# Patient Record
Sex: Male | Born: 1950 | Race: White | Hispanic: No | Marital: Married | Smoking: Former smoker
Health system: Southern US, Community
[De-identification: ages and names within clinical notes are randomized; demographics above are authoritative.]

## PROBLEM LIST (undated history)

## (undated) DIAGNOSIS — A515 Early syphilis, latent: Secondary | ICD-10-CM

## (undated) DIAGNOSIS — B20 Human immunodeficiency virus [HIV] disease: Secondary | ICD-10-CM

## (undated) DIAGNOSIS — A159 Respiratory tuberculosis unspecified: Secondary | ICD-10-CM

## (undated) DIAGNOSIS — I2510 Atherosclerotic heart disease of native coronary artery without angina pectoris: Secondary | ICD-10-CM

## (undated) DIAGNOSIS — H269 Unspecified cataract: Secondary | ICD-10-CM

## (undated) DIAGNOSIS — E079 Disorder of thyroid, unspecified: Secondary | ICD-10-CM

## (undated) DIAGNOSIS — Z87442 Personal history of urinary calculi: Secondary | ICD-10-CM

## (undated) DIAGNOSIS — E291 Testicular hypofunction: Secondary | ICD-10-CM

## (undated) DIAGNOSIS — E119 Type 2 diabetes mellitus without complications: Secondary | ICD-10-CM

## (undated) DIAGNOSIS — C099 Malignant neoplasm of tonsil, unspecified: Secondary | ICD-10-CM

## (undated) DIAGNOSIS — N189 Chronic kidney disease, unspecified: Secondary | ICD-10-CM

## (undated) DIAGNOSIS — I10 Essential (primary) hypertension: Secondary | ICD-10-CM

## (undated) DIAGNOSIS — K219 Gastro-esophageal reflux disease without esophagitis: Secondary | ICD-10-CM

## (undated) DIAGNOSIS — F419 Anxiety disorder, unspecified: Secondary | ICD-10-CM

## (undated) DIAGNOSIS — G709 Myoneural disorder, unspecified: Secondary | ICD-10-CM

## (undated) DIAGNOSIS — R011 Cardiac murmur, unspecified: Secondary | ICD-10-CM

## (undated) DIAGNOSIS — E785 Hyperlipidemia, unspecified: Secondary | ICD-10-CM

## (undated) DIAGNOSIS — E039 Hypothyroidism, unspecified: Secondary | ICD-10-CM

## (undated) HISTORY — DX: Testicular hypofunction: E29.1

## (undated) HISTORY — DX: Early syphilis, latent: A51.5

## (undated) HISTORY — DX: Unspecified cataract: H26.9

## (undated) HISTORY — PX: COLONOSCOPY: SHX174

## (undated) HISTORY — DX: Anxiety disorder, unspecified: F41.9

## (undated) HISTORY — DX: Gastro-esophageal reflux disease without esophagitis: K21.9

## (undated) HISTORY — PX: TONSILECTOMY, ADENOIDECTOMY, BILATERAL MYRINGOTOMY AND TUBES: SHX2538

## (undated) HISTORY — DX: Chronic kidney disease, unspecified: N18.9

## (undated) HISTORY — DX: Myoneural disorder, unspecified: G70.9

## (undated) HISTORY — DX: Cardiac murmur, unspecified: R01.1

## (undated) HISTORY — DX: Essential (primary) hypertension: I10

## (undated) HISTORY — DX: Hyperlipidemia, unspecified: E78.5

## (undated) HISTORY — DX: Human immunodeficiency virus (HIV) disease: B20

## (undated) HISTORY — DX: Disorder of thyroid, unspecified: E07.9

## (undated) HISTORY — PX: UPPER GASTROINTESTINAL ENDOSCOPY: SHX188

## (undated) HISTORY — DX: Respiratory tuberculosis unspecified: A15.9

## (undated) HISTORY — PX: TONSILLECTOMY: SUR1361

---

## 1992-05-07 ENCOUNTER — Encounter (INDEPENDENT_AMBULATORY_CARE_PROVIDER_SITE_OTHER): Payer: Self-pay | Admitting: *Deleted

## 1992-05-07 LAB — CONVERTED CEMR LAB: CD4 T Cell Abs: 9

## 1998-04-14 ENCOUNTER — Encounter: Admission: RE | Admit: 1998-04-14 | Discharge: 1998-04-14 | Payer: Self-pay | Admitting: Infectious Diseases

## 1998-05-01 ENCOUNTER — Encounter: Admission: RE | Admit: 1998-05-01 | Discharge: 1998-05-01 | Payer: Self-pay | Admitting: Infectious Diseases

## 1998-08-21 ENCOUNTER — Ambulatory Visit (HOSPITAL_COMMUNITY): Admission: RE | Admit: 1998-08-21 | Discharge: 1998-08-21 | Payer: Self-pay | Admitting: Infectious Diseases

## 1998-09-11 ENCOUNTER — Encounter: Admission: RE | Admit: 1998-09-11 | Discharge: 1998-09-11 | Payer: Self-pay | Admitting: Infectious Diseases

## 1998-09-18 ENCOUNTER — Ambulatory Visit (HOSPITAL_COMMUNITY): Admission: RE | Admit: 1998-09-18 | Discharge: 1998-09-18 | Payer: Self-pay | Admitting: Infectious Diseases

## 1998-10-20 ENCOUNTER — Encounter: Admission: RE | Admit: 1998-10-20 | Discharge: 1998-10-20 | Payer: Self-pay | Admitting: Infectious Diseases

## 1998-11-06 ENCOUNTER — Encounter: Admission: RE | Admit: 1998-11-06 | Discharge: 1998-11-06 | Payer: Self-pay | Admitting: Infectious Diseases

## 1999-01-15 ENCOUNTER — Ambulatory Visit (HOSPITAL_COMMUNITY): Admission: RE | Admit: 1999-01-15 | Discharge: 1999-01-15 | Payer: Self-pay | Admitting: Infectious Diseases

## 1999-03-05 ENCOUNTER — Encounter: Admission: RE | Admit: 1999-03-05 | Discharge: 1999-03-05 | Payer: Self-pay | Admitting: Infectious Diseases

## 1999-04-16 ENCOUNTER — Encounter: Admission: RE | Admit: 1999-04-16 | Discharge: 1999-04-16 | Payer: Self-pay | Admitting: Infectious Diseases

## 1999-06-01 ENCOUNTER — Ambulatory Visit (HOSPITAL_COMMUNITY): Admission: RE | Admit: 1999-06-01 | Discharge: 1999-06-01 | Payer: Self-pay | Admitting: Infectious Diseases

## 1999-06-01 ENCOUNTER — Encounter: Admission: RE | Admit: 1999-06-01 | Discharge: 1999-06-01 | Payer: Self-pay | Admitting: Infectious Diseases

## 1999-06-18 ENCOUNTER — Encounter: Admission: RE | Admit: 1999-06-18 | Discharge: 1999-06-18 | Payer: Self-pay | Admitting: Infectious Diseases

## 1999-09-17 ENCOUNTER — Encounter: Admission: RE | Admit: 1999-09-17 | Discharge: 1999-09-17 | Payer: Self-pay | Admitting: Infectious Diseases

## 1999-11-01 ENCOUNTER — Encounter: Admission: RE | Admit: 1999-11-01 | Discharge: 1999-11-01 | Payer: Self-pay | Admitting: Infectious Diseases

## 1999-11-01 ENCOUNTER — Ambulatory Visit (HOSPITAL_COMMUNITY): Admission: RE | Admit: 1999-11-01 | Discharge: 1999-11-01 | Payer: Self-pay | Admitting: Infectious Diseases

## 1999-11-26 ENCOUNTER — Encounter: Admission: RE | Admit: 1999-11-26 | Discharge: 1999-11-26 | Payer: Self-pay | Admitting: Infectious Diseases

## 2000-02-28 ENCOUNTER — Encounter: Admission: RE | Admit: 2000-02-28 | Discharge: 2000-02-28 | Payer: Self-pay | Admitting: Infectious Diseases

## 2000-02-28 ENCOUNTER — Ambulatory Visit (HOSPITAL_COMMUNITY): Admission: RE | Admit: 2000-02-28 | Discharge: 2000-02-28 | Payer: Self-pay | Admitting: Infectious Diseases

## 2000-03-24 ENCOUNTER — Encounter: Admission: RE | Admit: 2000-03-24 | Discharge: 2000-03-24 | Payer: Self-pay | Admitting: Infectious Diseases

## 2000-07-21 ENCOUNTER — Ambulatory Visit (HOSPITAL_COMMUNITY): Admission: RE | Admit: 2000-07-21 | Discharge: 2000-07-21 | Payer: Self-pay | Admitting: Infectious Diseases

## 2000-07-21 ENCOUNTER — Encounter: Admission: RE | Admit: 2000-07-21 | Discharge: 2000-07-21 | Payer: Self-pay | Admitting: Infectious Diseases

## 2000-08-25 ENCOUNTER — Encounter: Admission: RE | Admit: 2000-08-25 | Discharge: 2000-08-25 | Payer: Self-pay | Admitting: Infectious Diseases

## 2000-09-22 ENCOUNTER — Encounter: Admission: RE | Admit: 2000-09-22 | Discharge: 2000-09-22 | Payer: Self-pay | Admitting: Internal Medicine

## 2000-09-25 ENCOUNTER — Encounter: Admission: RE | Admit: 2000-09-25 | Discharge: 2000-09-25 | Payer: Self-pay | Admitting: Infectious Diseases

## 2000-12-29 ENCOUNTER — Encounter: Admission: RE | Admit: 2000-12-29 | Discharge: 2000-12-29 | Payer: Self-pay | Admitting: Infectious Diseases

## 2000-12-29 ENCOUNTER — Ambulatory Visit: Admission: RE | Admit: 2000-12-29 | Discharge: 2000-12-29 | Payer: Self-pay | Admitting: Infectious Diseases

## 2001-01-25 ENCOUNTER — Encounter: Admission: RE | Admit: 2001-01-25 | Discharge: 2001-01-25 | Payer: Self-pay | Admitting: Infectious Diseases

## 2001-03-02 ENCOUNTER — Ambulatory Visit (HOSPITAL_COMMUNITY): Admission: RE | Admit: 2001-03-02 | Discharge: 2001-03-02 | Payer: Self-pay | Admitting: Internal Medicine

## 2001-03-02 ENCOUNTER — Encounter: Admission: RE | Admit: 2001-03-02 | Discharge: 2001-03-02 | Payer: Self-pay | Admitting: Infectious Diseases

## 2001-03-16 ENCOUNTER — Encounter: Admission: RE | Admit: 2001-03-16 | Discharge: 2001-03-16 | Payer: Self-pay | Admitting: Infectious Diseases

## 2001-05-08 ENCOUNTER — Encounter: Admission: RE | Admit: 2001-05-08 | Discharge: 2001-05-08 | Payer: Self-pay | Admitting: Infectious Diseases

## 2001-05-08 ENCOUNTER — Ambulatory Visit (HOSPITAL_COMMUNITY): Admission: RE | Admit: 2001-05-08 | Discharge: 2001-05-08 | Payer: Self-pay | Admitting: Infectious Diseases

## 2001-05-18 ENCOUNTER — Ambulatory Visit (HOSPITAL_COMMUNITY): Admission: RE | Admit: 2001-05-18 | Discharge: 2001-05-18 | Payer: Self-pay | Admitting: Infectious Diseases

## 2001-05-18 ENCOUNTER — Encounter: Payer: Self-pay | Admitting: Infectious Diseases

## 2001-05-18 ENCOUNTER — Encounter: Admission: RE | Admit: 2001-05-18 | Discharge: 2001-05-18 | Payer: Self-pay | Admitting: Infectious Diseases

## 2001-05-21 ENCOUNTER — Inpatient Hospital Stay (HOSPITAL_COMMUNITY): Admission: AD | Admit: 2001-05-21 | Discharge: 2001-05-25 | Payer: Self-pay | Admitting: Internal Medicine

## 2001-05-21 ENCOUNTER — Encounter (INDEPENDENT_AMBULATORY_CARE_PROVIDER_SITE_OTHER): Payer: Self-pay | Admitting: *Deleted

## 2001-05-23 ENCOUNTER — Encounter: Payer: Self-pay | Admitting: Critical Care Medicine

## 2001-05-23 ENCOUNTER — Encounter: Payer: Self-pay | Admitting: Internal Medicine

## 2001-05-31 ENCOUNTER — Ambulatory Visit (HOSPITAL_COMMUNITY): Admission: RE | Admit: 2001-05-31 | Discharge: 2001-05-31 | Payer: Self-pay | Admitting: Infectious Diseases

## 2001-05-31 ENCOUNTER — Encounter: Payer: Self-pay | Admitting: Infectious Diseases

## 2001-05-31 ENCOUNTER — Encounter: Admission: RE | Admit: 2001-05-31 | Discharge: 2001-05-31 | Payer: Self-pay | Admitting: Infectious Diseases

## 2001-06-22 ENCOUNTER — Ambulatory Visit (HOSPITAL_COMMUNITY): Admission: RE | Admit: 2001-06-22 | Discharge: 2001-06-22 | Payer: Self-pay | Admitting: Infectious Diseases

## 2001-06-22 ENCOUNTER — Encounter: Payer: Self-pay | Admitting: Infectious Diseases

## 2001-07-16 ENCOUNTER — Ambulatory Visit (HOSPITAL_COMMUNITY): Admission: RE | Admit: 2001-07-16 | Discharge: 2001-07-16 | Payer: Self-pay | Admitting: Infectious Diseases

## 2001-07-16 ENCOUNTER — Encounter: Admission: RE | Admit: 2001-07-16 | Discharge: 2001-07-16 | Payer: Self-pay | Admitting: Infectious Diseases

## 2001-09-17 ENCOUNTER — Encounter: Admission: RE | Admit: 2001-09-17 | Discharge: 2001-09-17 | Payer: Self-pay | Admitting: Internal Medicine

## 2001-11-30 ENCOUNTER — Encounter: Admission: RE | Admit: 2001-11-30 | Discharge: 2001-11-30 | Payer: Self-pay | Admitting: Infectious Diseases

## 2001-11-30 ENCOUNTER — Ambulatory Visit (HOSPITAL_COMMUNITY): Admission: RE | Admit: 2001-11-30 | Discharge: 2001-11-30 | Payer: Self-pay | Admitting: Infectious Diseases

## 2001-12-20 ENCOUNTER — Encounter: Admission: RE | Admit: 2001-12-20 | Discharge: 2001-12-20 | Payer: Self-pay | Admitting: Infectious Diseases

## 2001-12-21 ENCOUNTER — Encounter: Admission: RE | Admit: 2001-12-21 | Discharge: 2001-12-21 | Payer: Self-pay | Admitting: Infectious Diseases

## 2002-05-10 ENCOUNTER — Ambulatory Visit (HOSPITAL_COMMUNITY): Admission: RE | Admit: 2002-05-10 | Discharge: 2002-05-10 | Payer: Self-pay | Admitting: Infectious Diseases

## 2002-05-10 ENCOUNTER — Encounter: Admission: RE | Admit: 2002-05-10 | Discharge: 2002-05-10 | Payer: Self-pay | Admitting: Infectious Diseases

## 2002-06-07 ENCOUNTER — Encounter: Admission: RE | Admit: 2002-06-07 | Discharge: 2002-06-07 | Payer: Self-pay | Admitting: Infectious Diseases

## 2002-09-09 ENCOUNTER — Encounter: Admission: RE | Admit: 2002-09-09 | Discharge: 2002-09-09 | Payer: Self-pay | Admitting: Infectious Diseases

## 2002-09-19 ENCOUNTER — Encounter: Admission: RE | Admit: 2002-09-19 | Discharge: 2002-09-19 | Payer: Self-pay | Admitting: Internal Medicine

## 2002-09-20 ENCOUNTER — Ambulatory Visit (HOSPITAL_COMMUNITY): Admission: RE | Admit: 2002-09-20 | Discharge: 2002-09-20 | Payer: Self-pay | Admitting: Infectious Diseases

## 2002-09-20 ENCOUNTER — Encounter: Admission: RE | Admit: 2002-09-20 | Discharge: 2002-09-20 | Payer: Self-pay | Admitting: Internal Medicine

## 2002-10-10 ENCOUNTER — Encounter: Admission: RE | Admit: 2002-10-10 | Discharge: 2002-10-10 | Payer: Self-pay | Admitting: Infectious Diseases

## 2003-01-31 ENCOUNTER — Encounter: Admission: RE | Admit: 2003-01-31 | Discharge: 2003-01-31 | Payer: Self-pay | Admitting: Infectious Diseases

## 2003-02-21 ENCOUNTER — Encounter: Admission: RE | Admit: 2003-02-21 | Discharge: 2003-02-21 | Payer: Self-pay | Admitting: Infectious Diseases

## 2003-02-24 ENCOUNTER — Encounter: Admission: RE | Admit: 2003-02-24 | Discharge: 2003-02-24 | Payer: Self-pay | Admitting: Infectious Diseases

## 2003-04-25 ENCOUNTER — Encounter: Admission: RE | Admit: 2003-04-25 | Discharge: 2003-04-25 | Payer: Self-pay | Admitting: Infectious Diseases

## 2003-04-25 ENCOUNTER — Other Ambulatory Visit: Admission: RE | Admit: 2003-04-25 | Discharge: 2003-04-25 | Payer: Self-pay | Admitting: Infectious Diseases

## 2003-04-25 ENCOUNTER — Encounter: Payer: Self-pay | Admitting: Infectious Diseases

## 2003-05-15 ENCOUNTER — Encounter: Admission: RE | Admit: 2003-05-15 | Discharge: 2003-05-15 | Payer: Self-pay | Admitting: Infectious Diseases

## 2003-05-20 ENCOUNTER — Encounter: Payer: Self-pay | Admitting: Infectious Diseases

## 2003-05-20 ENCOUNTER — Ambulatory Visit (HOSPITAL_COMMUNITY): Admission: RE | Admit: 2003-05-20 | Discharge: 2003-05-20 | Payer: Self-pay | Admitting: Infectious Diseases

## 2003-05-23 ENCOUNTER — Encounter: Admission: RE | Admit: 2003-05-23 | Discharge: 2003-05-23 | Payer: Self-pay | Admitting: Infectious Diseases

## 2003-05-30 ENCOUNTER — Encounter: Admission: RE | Admit: 2003-05-30 | Discharge: 2003-05-30 | Payer: Self-pay | Admitting: Infectious Diseases

## 2003-06-10 ENCOUNTER — Ambulatory Visit (HOSPITAL_COMMUNITY): Admission: RE | Admit: 2003-06-10 | Discharge: 2003-06-10 | Payer: Self-pay | Admitting: General Surgery

## 2003-07-18 ENCOUNTER — Ambulatory Visit (HOSPITAL_COMMUNITY): Admission: RE | Admit: 2003-07-18 | Discharge: 2003-07-18 | Payer: Self-pay | Admitting: Infectious Diseases

## 2003-07-18 ENCOUNTER — Encounter: Payer: Self-pay | Admitting: Infectious Diseases

## 2003-07-18 ENCOUNTER — Encounter: Admission: RE | Admit: 2003-07-18 | Discharge: 2003-07-18 | Payer: Self-pay | Admitting: Infectious Diseases

## 2003-08-01 ENCOUNTER — Encounter: Admission: RE | Admit: 2003-08-01 | Discharge: 2003-08-01 | Payer: Self-pay | Admitting: Infectious Diseases

## 2003-09-05 ENCOUNTER — Encounter: Admission: RE | Admit: 2003-09-05 | Discharge: 2003-09-05 | Payer: Self-pay | Admitting: Internal Medicine

## 2003-10-03 ENCOUNTER — Ambulatory Visit (HOSPITAL_COMMUNITY): Admission: RE | Admit: 2003-10-03 | Discharge: 2003-10-03 | Payer: Self-pay | Admitting: Infectious Diseases

## 2003-10-03 ENCOUNTER — Encounter: Admission: RE | Admit: 2003-10-03 | Discharge: 2003-10-03 | Payer: Self-pay | Admitting: Infectious Diseases

## 2003-10-03 ENCOUNTER — Encounter: Payer: Self-pay | Admitting: Infectious Diseases

## 2003-10-20 ENCOUNTER — Encounter: Admission: RE | Admit: 2003-10-20 | Discharge: 2003-10-20 | Payer: Self-pay | Admitting: Infectious Diseases

## 2004-01-16 ENCOUNTER — Ambulatory Visit (HOSPITAL_COMMUNITY): Admission: RE | Admit: 2004-01-16 | Discharge: 2004-01-16 | Payer: Self-pay | Admitting: Infectious Diseases

## 2004-01-16 ENCOUNTER — Encounter: Admission: RE | Admit: 2004-01-16 | Discharge: 2004-01-16 | Payer: Self-pay | Admitting: Infectious Diseases

## 2004-02-27 ENCOUNTER — Encounter: Admission: RE | Admit: 2004-02-27 | Discharge: 2004-02-27 | Payer: Self-pay | Admitting: Infectious Diseases

## 2004-06-04 ENCOUNTER — Ambulatory Visit (HOSPITAL_COMMUNITY): Admission: RE | Admit: 2004-06-04 | Discharge: 2004-06-04 | Payer: Self-pay | Admitting: Infectious Diseases

## 2004-06-04 ENCOUNTER — Encounter: Admission: RE | Admit: 2004-06-04 | Discharge: 2004-06-04 | Payer: Self-pay | Admitting: Infectious Diseases

## 2004-06-18 ENCOUNTER — Encounter: Admission: RE | Admit: 2004-06-18 | Discharge: 2004-06-18 | Payer: Self-pay | Admitting: Infectious Diseases

## 2004-09-08 ENCOUNTER — Ambulatory Visit (HOSPITAL_COMMUNITY): Admission: RE | Admit: 2004-09-08 | Discharge: 2004-09-08 | Payer: Self-pay | Admitting: Infectious Diseases

## 2004-09-08 ENCOUNTER — Ambulatory Visit: Payer: Self-pay | Admitting: Infectious Diseases

## 2004-09-09 ENCOUNTER — Ambulatory Visit: Payer: Self-pay | Admitting: Infectious Diseases

## 2004-10-01 ENCOUNTER — Ambulatory Visit: Payer: Self-pay | Admitting: Infectious Diseases

## 2004-10-08 ENCOUNTER — Ambulatory Visit: Payer: Self-pay | Admitting: Infectious Diseases

## 2005-01-19 ENCOUNTER — Ambulatory Visit: Payer: Self-pay | Admitting: Infectious Diseases

## 2005-01-19 ENCOUNTER — Ambulatory Visit (HOSPITAL_COMMUNITY): Admission: RE | Admit: 2005-01-19 | Discharge: 2005-01-19 | Payer: Self-pay | Admitting: Infectious Diseases

## 2005-02-03 ENCOUNTER — Ambulatory Visit: Payer: Self-pay | Admitting: Infectious Diseases

## 2005-05-06 ENCOUNTER — Ambulatory Visit (HOSPITAL_COMMUNITY): Admission: RE | Admit: 2005-05-06 | Discharge: 2005-05-06 | Payer: Self-pay | Admitting: Infectious Diseases

## 2005-05-06 ENCOUNTER — Ambulatory Visit: Payer: Self-pay | Admitting: Infectious Diseases

## 2005-06-02 ENCOUNTER — Ambulatory Visit: Payer: Self-pay | Admitting: Infectious Diseases

## 2005-08-12 ENCOUNTER — Ambulatory Visit (HOSPITAL_COMMUNITY): Admission: RE | Admit: 2005-08-12 | Discharge: 2005-08-12 | Payer: Self-pay | Admitting: Infectious Diseases

## 2005-08-12 ENCOUNTER — Ambulatory Visit: Payer: Self-pay | Admitting: Infectious Diseases

## 2005-11-25 ENCOUNTER — Ambulatory Visit (HOSPITAL_COMMUNITY): Admission: RE | Admit: 2005-11-25 | Discharge: 2005-11-25 | Payer: Self-pay | Admitting: Infectious Diseases

## 2005-11-25 ENCOUNTER — Encounter (INDEPENDENT_AMBULATORY_CARE_PROVIDER_SITE_OTHER): Payer: Self-pay | Admitting: *Deleted

## 2005-11-25 ENCOUNTER — Ambulatory Visit: Payer: Self-pay | Admitting: Infectious Diseases

## 2005-11-25 LAB — CONVERTED CEMR LAB
CD4 Count: 170 microliters
HIV 1 RNA Quant: 399 copies/mL

## 2006-01-12 ENCOUNTER — Ambulatory Visit: Payer: Self-pay | Admitting: Infectious Diseases

## 2006-07-19 ENCOUNTER — Encounter (INDEPENDENT_AMBULATORY_CARE_PROVIDER_SITE_OTHER): Payer: Self-pay | Admitting: *Deleted

## 2006-07-19 ENCOUNTER — Ambulatory Visit: Payer: Self-pay | Admitting: Infectious Diseases

## 2006-07-19 ENCOUNTER — Encounter: Admission: RE | Admit: 2006-07-19 | Discharge: 2006-07-19 | Payer: Self-pay | Admitting: Infectious Diseases

## 2006-07-19 LAB — CONVERTED CEMR LAB: HIV 1 RNA Quant: 49 copies/mL

## 2006-08-04 ENCOUNTER — Ambulatory Visit: Payer: Self-pay | Admitting: Infectious Diseases

## 2006-11-23 ENCOUNTER — Ambulatory Visit: Payer: Self-pay | Admitting: Infectious Diseases

## 2006-11-23 ENCOUNTER — Encounter: Admission: RE | Admit: 2006-11-23 | Discharge: 2006-11-23 | Payer: Self-pay | Admitting: Infectious Diseases

## 2006-11-23 ENCOUNTER — Encounter (INDEPENDENT_AMBULATORY_CARE_PROVIDER_SITE_OTHER): Payer: Self-pay | Admitting: *Deleted

## 2006-11-23 LAB — CONVERTED CEMR LAB
ALT: 36 units/L (ref 0–53)
AST: 37 units/L (ref 0–37)
Albumin: 3.7 g/dL (ref 3.5–5.2)
Alkaline Phosphatase: 141 units/L — ABNORMAL HIGH (ref 39–117)
Basophils Absolute: 0.1 10*3/uL (ref 0.0–0.1)
Basophils Relative: 1 % (ref 0–1)
CO2: 27 meq/L (ref 19–32)
Calcium: 9 mg/dL (ref 8.4–10.5)
Chloride: 102 meq/L (ref 96–112)
Creatinine, Ser: 1.6 mg/dL — ABNORMAL HIGH (ref 0.40–1.50)
Eosinophils Relative: 2 % (ref 0–5)
HCT: 47.2 % (ref 39.0–52.0)
HIV 1 RNA Quant: <50 copies/mL (ref <50)
HIV-1 RNA Quant, Log: <1.7 (ref <1.70)
Lymphocytes Relative: 12 % (ref 12–46)
Lymphs Abs: 0.8 10*3/uL (ref 0.7–3.3)
MCV: 96.9 fL (ref 78.0–100.0)
Monocytes Relative: 12 % — ABNORMAL HIGH (ref 3–11)
RDW: 16.6 % — ABNORMAL HIGH (ref 11.5–14.0)
Total Protein: 8.2 g/dL (ref 6.0–8.3)

## 2006-12-01 DIAGNOSIS — I10 Essential (primary) hypertension: Secondary | ICD-10-CM

## 2006-12-01 DIAGNOSIS — B20 Human immunodeficiency virus [HIV] disease: Secondary | ICD-10-CM | POA: Insufficient documentation

## 2006-12-01 DIAGNOSIS — R809 Proteinuria, unspecified: Secondary | ICD-10-CM | POA: Insufficient documentation

## 2006-12-01 DIAGNOSIS — N289 Disorder of kidney and ureter, unspecified: Secondary | ICD-10-CM | POA: Insufficient documentation

## 2006-12-01 HISTORY — DX: Essential (primary) hypertension: I10

## 2006-12-01 HISTORY — DX: Human immunodeficiency virus (HIV) disease: B20

## 2006-12-11 ENCOUNTER — Encounter: Payer: Self-pay | Admitting: Infectious Diseases

## 2007-01-11 ENCOUNTER — Ambulatory Visit: Payer: Self-pay | Admitting: Infectious Diseases

## 2007-01-11 LAB — CONVERTED CEMR LAB: RPR Titer: 1:256 {titer} — AB

## 2007-01-15 ENCOUNTER — Encounter (INDEPENDENT_AMBULATORY_CARE_PROVIDER_SITE_OTHER): Payer: Self-pay | Admitting: *Deleted

## 2007-01-15 LAB — CONVERTED CEMR LAB

## 2007-01-18 ENCOUNTER — Ambulatory Visit: Payer: Self-pay | Admitting: Infectious Diseases

## 2007-01-18 ENCOUNTER — Telehealth: Payer: Self-pay | Admitting: Infectious Diseases

## 2007-01-18 DIAGNOSIS — A515 Early syphilis, latent: Secondary | ICD-10-CM

## 2007-01-18 HISTORY — DX: Early syphilis, latent: A51.5

## 2007-01-18 LAB — CONVERTED CEMR LAB: RPR Titer: 256

## 2007-01-28 ENCOUNTER — Encounter (INDEPENDENT_AMBULATORY_CARE_PROVIDER_SITE_OTHER): Payer: Self-pay | Admitting: *Deleted

## 2007-02-05 ENCOUNTER — Telehealth: Payer: Self-pay | Admitting: Infectious Diseases

## 2007-02-06 ENCOUNTER — Encounter (INDEPENDENT_AMBULATORY_CARE_PROVIDER_SITE_OTHER): Payer: Self-pay | Admitting: *Deleted

## 2007-03-02 ENCOUNTER — Telehealth: Payer: Self-pay | Admitting: Infectious Diseases

## 2007-03-12 ENCOUNTER — Telehealth: Payer: Self-pay | Admitting: Infectious Diseases

## 2007-04-02 ENCOUNTER — Telehealth: Payer: Self-pay | Admitting: Infectious Diseases

## 2007-04-04 ENCOUNTER — Ambulatory Visit: Payer: Self-pay | Admitting: Infectious Diseases

## 2007-04-04 ENCOUNTER — Encounter: Admission: RE | Admit: 2007-04-04 | Discharge: 2007-04-04 | Payer: Self-pay | Admitting: Infectious Diseases

## 2007-04-04 LAB — CONVERTED CEMR LAB
Albumin: 4.4 g/dL (ref 3.5–5.2)
Alkaline Phosphatase: 117 units/L (ref 39–117)
Basophils Relative: 1 % (ref 0–1)
CO2: 27 meq/L (ref 19–32)
Creatinine, Ser: 1.43 mg/dL (ref 0.40–1.50)
Eosinophils Absolute: 0.2 10*3/uL (ref 0.0–0.7)
Glucose, Bld: 101 mg/dL — ABNORMAL HIGH (ref 70–99)
HCT: 56 % — ABNORMAL HIGH (ref 39.0–52.0)
HIV 1 RNA Quant: <50 copies/mL (ref <50)
HIV-1 RNA Quant, Log: <1.7 (ref <1.70)
MCV: 97.2 fL (ref 78.0–100.0)
Monocytes Relative: 8 % (ref 3–11)
Neutrophils Relative %: 52 % (ref 43–77)
Platelets: 179 10*3/uL (ref 150–400)
RDW: 13.9 % (ref 11.5–14.0)
Total Bilirubin: 0.5 mg/dL (ref 0.3–1.2)
Total Protein: 7.9 g/dL (ref 6.0–8.3)

## 2007-04-24 ENCOUNTER — Ambulatory Visit: Payer: Self-pay | Admitting: Infectious Diseases

## 2007-05-07 ENCOUNTER — Ambulatory Visit: Payer: Self-pay | Admitting: Infectious Diseases

## 2007-05-07 ENCOUNTER — Telehealth: Payer: Self-pay

## 2007-05-29 ENCOUNTER — Telehealth: Payer: Self-pay | Admitting: Infectious Diseases

## 2007-06-08 ENCOUNTER — Encounter (INDEPENDENT_AMBULATORY_CARE_PROVIDER_SITE_OTHER): Payer: Self-pay | Admitting: *Deleted

## 2007-10-01 ENCOUNTER — Telehealth: Payer: Self-pay | Admitting: Infectious Diseases

## 2007-11-14 ENCOUNTER — Telehealth: Payer: Self-pay | Admitting: Infectious Diseases

## 2007-11-19 ENCOUNTER — Encounter (INDEPENDENT_AMBULATORY_CARE_PROVIDER_SITE_OTHER): Payer: Self-pay | Admitting: *Deleted

## 2007-11-20 ENCOUNTER — Encounter (INDEPENDENT_AMBULATORY_CARE_PROVIDER_SITE_OTHER): Payer: Self-pay | Admitting: *Deleted

## 2007-12-20 ENCOUNTER — Encounter: Admission: RE | Admit: 2007-12-20 | Discharge: 2007-12-20 | Payer: Self-pay | Admitting: Infectious Diseases

## 2007-12-20 ENCOUNTER — Ambulatory Visit: Payer: Self-pay | Admitting: Infectious Diseases

## 2007-12-20 LAB — CONVERTED CEMR LAB
ALT: 57 units/L — ABNORMAL HIGH (ref 0–53)
AST: 37 units/L (ref 0–37)
Albumin: 4.2 g/dL (ref 3.5–5.2)
Alkaline Phosphatase: 88 units/L (ref 39–117)
Basophils Relative: 1 % (ref 0–1)
CO2: 24 meq/L (ref 19–32)
Cholesterol: 251 mg/dL — ABNORMAL HIGH (ref 0–200)
Eosinophils Absolute: 0.1 10*3/uL (ref 0.0–0.7)
Eosinophils Relative: 3 % (ref 0–5)
Glucose, Bld: 188 mg/dL — ABNORMAL HIGH (ref 70–99)
HDL: 16 mg/dL — ABNORMAL LOW (ref >39)
Hemoglobin: 16.5 g/dL (ref 13.0–17.0)
Lymphocytes Relative: 28 % (ref 12–46)
MCV: 102.2 fL — ABNORMAL HIGH (ref 78.0–100.0)
Monocytes Relative: 5 % (ref 3–12)
Potassium: 4.2 meq/L (ref 3.5–5.3)
RDW: 15.7 % — ABNORMAL HIGH (ref 11.5–15.5)
Sodium: 137 meq/L (ref 135–145)
Total CHOL/HDL Ratio: 15.7
Total Protein: 7.8 g/dL (ref 6.0–8.3)
WBC: 4.6 10*3/uL (ref 4.0–10.5)

## 2008-01-01 ENCOUNTER — Telehealth: Payer: Self-pay | Admitting: Infectious Diseases

## 2008-01-03 ENCOUNTER — Encounter (INDEPENDENT_AMBULATORY_CARE_PROVIDER_SITE_OTHER): Payer: Self-pay | Admitting: *Deleted

## 2008-01-03 ENCOUNTER — Ambulatory Visit: Payer: Self-pay | Admitting: Infectious Diseases

## 2008-01-03 DIAGNOSIS — E785 Hyperlipidemia, unspecified: Secondary | ICD-10-CM

## 2008-01-03 DIAGNOSIS — E1129 Type 2 diabetes mellitus with other diabetic kidney complication: Secondary | ICD-10-CM

## 2008-01-03 HISTORY — DX: Hyperlipidemia, unspecified: E78.5

## 2008-01-10 ENCOUNTER — Telehealth: Payer: Self-pay | Admitting: Infectious Diseases

## 2008-03-31 ENCOUNTER — Telehealth (INDEPENDENT_AMBULATORY_CARE_PROVIDER_SITE_OTHER): Payer: Self-pay | Admitting: *Deleted

## 2008-04-04 ENCOUNTER — Telehealth (INDEPENDENT_AMBULATORY_CARE_PROVIDER_SITE_OTHER): Payer: Self-pay | Admitting: *Deleted

## 2008-05-07 ENCOUNTER — Telehealth: Payer: Self-pay | Admitting: Infectious Diseases

## 2008-05-07 ENCOUNTER — Emergency Department (HOSPITAL_COMMUNITY): Admission: EM | Admit: 2008-05-07 | Discharge: 2008-05-07 | Payer: Self-pay | Admitting: Emergency Medicine

## 2008-05-12 ENCOUNTER — Encounter: Admission: RE | Admit: 2008-05-12 | Discharge: 2008-05-12 | Payer: Self-pay | Admitting: Infectious Diseases

## 2008-05-12 ENCOUNTER — Ambulatory Visit: Payer: Self-pay | Admitting: Infectious Diseases

## 2008-05-12 LAB — CONVERTED CEMR LAB
AST: 50 units/L — ABNORMAL HIGH (ref 0–37)
Basophils Absolute: 0 10*3/uL (ref 0.0–0.1)
Basophils Relative: 1 % (ref 0–1)
CO2: 26 meq/L (ref 19–32)
Chloride: 105 meq/L (ref 96–112)
Creatinine, Ser: 1.45 mg/dL (ref 0.40–1.50)
Eosinophils Absolute: 0.2 10*3/uL (ref 0.0–0.7)
Glucose, Bld: 119 mg/dL — ABNORMAL HIGH (ref 70–99)
HIV 1 RNA Quant: <50 copies/mL (ref <50)
HIV-1 RNA Quant, Log: <1.7 (ref <1.70)
Lymphocytes Relative: 23 % (ref 12–46)
MCHC: 32.5 g/dL (ref 30.0–36.0)
Monocytes Absolute: 0.4 10*3/uL (ref 0.1–1.0)
Neutro Abs: 2.7 10*3/uL (ref 1.7–7.7)
Platelets: 211 10*3/uL (ref 150–400)
RDW: 15.7 % — ABNORMAL HIGH (ref 11.5–15.5)
Total Protein: 7.5 g/dL (ref 6.0–8.3)

## 2008-05-29 ENCOUNTER — Ambulatory Visit: Payer: Self-pay | Admitting: Infectious Diseases

## 2008-06-20 ENCOUNTER — Telehealth (INDEPENDENT_AMBULATORY_CARE_PROVIDER_SITE_OTHER): Payer: Self-pay | Admitting: *Deleted

## 2008-08-06 ENCOUNTER — Ambulatory Visit: Payer: Self-pay | Admitting: Infectious Diseases

## 2008-08-06 DIAGNOSIS — J039 Acute tonsillitis, unspecified: Secondary | ICD-10-CM | POA: Insufficient documentation

## 2008-08-06 LAB — CONVERTED CEMR LAB
Basophils Absolute: 0.1 10*3/uL (ref 0.0–0.1)
Eosinophils Relative: 4 % (ref 0–5)
HCT: 53.6 % — ABNORMAL HIGH (ref 39.0–52.0)
Lymphocytes Relative: 26 % (ref 12–46)
Lymphs Abs: 1.5 10*3/uL (ref 0.7–4.0)
MCV: 104.9 fL — ABNORMAL HIGH (ref 78.0–100.0)
Monocytes Absolute: 0.5 10*3/uL (ref 0.1–1.0)
Monocytes Relative: 9 % (ref 3–12)
Neutrophils Relative %: 60 % (ref 43–77)
RBC: 5.11 M/uL (ref 4.22–5.81)
RDW: 12.9 % (ref 11.5–15.5)
WBC: 5.8 10*3/uL (ref 4.0–10.5)

## 2008-08-18 ENCOUNTER — Ambulatory Visit: Payer: Self-pay | Admitting: Infectious Diseases

## 2008-08-18 DIAGNOSIS — C069 Malignant neoplasm of mouth, unspecified: Secondary | ICD-10-CM | POA: Insufficient documentation

## 2008-08-18 LAB — CONVERTED CEMR LAB
Basophils Absolute: 0.1 10*3/uL (ref 0.0–0.1)
Eosinophils Absolute: 0.3 10*3/uL (ref 0.0–0.7)
HCT: 53.3 % — ABNORMAL HIGH (ref 39.0–52.0)
Hemoglobin: 17.5 g/dL — ABNORMAL HIGH (ref 13.0–17.0)
Lymphs Abs: 1.9 10*3/uL (ref 0.7–4.0)
MCHC: 32.8 g/dL (ref 30.0–36.0)
Monocytes Absolute: 0.6 10*3/uL (ref 0.1–1.0)
Monocytes Relative: 9 % (ref 3–12)
RDW: 12.3 % (ref 11.5–15.5)
RPR Titer: 1:2 {titer}
WBC: 6.5 10*3/uL (ref 4.0–10.5)

## 2008-09-25 ENCOUNTER — Ambulatory Visit (HOSPITAL_COMMUNITY): Admission: RE | Admit: 2008-09-25 | Discharge: 2008-09-25 | Payer: Self-pay | Admitting: Infectious Diseases

## 2008-09-25 ENCOUNTER — Ambulatory Visit: Payer: Self-pay | Admitting: Infectious Diseases

## 2008-09-25 DIAGNOSIS — R05 Cough: Secondary | ICD-10-CM

## 2008-10-21 ENCOUNTER — Ambulatory Visit: Payer: Self-pay | Admitting: Infectious Diseases

## 2008-10-21 LAB — CONVERTED CEMR LAB
ALT: 59 units/L — ABNORMAL HIGH (ref 0–53)
AST: 48 units/L — ABNORMAL HIGH (ref 0–37)
Albumin: 4.3 g/dL (ref 3.5–5.2)
Alkaline Phosphatase: 97 units/L (ref 39–117)
BUN: 22 mg/dL (ref 6–23)
Basophils Absolute: 0 10*3/uL (ref 0.0–0.1)
Basophils Relative: 1 % (ref 0–1)
CO2: 22 meq/L (ref 19–32)
Calcium: 8.9 mg/dL (ref 8.4–10.5)
Chloride: 104 meq/L (ref 96–112)
Creatinine, Ser: 1.28 mg/dL (ref 0.40–1.50)
Eosinophils Absolute: 0.1 10*3/uL (ref 0.0–0.7)
Eosinophils Relative: 4 % (ref 0–5)
Glucose, Bld: 166 mg/dL — ABNORMAL HIGH (ref 70–99)
HCT: 50.6 % (ref 39.0–52.0)
HIV-1 RNA Quant, Log: <1.68 (ref <1.68)
Hemoglobin: 16.8 g/dL (ref 13.0–17.0)
Lymphocytes Relative: 30 % (ref 12–46)
Lymphs Abs: 1 10*3/uL (ref 0.7–4.0)
MCHC: 33.2 g/dL (ref 30.0–36.0)
MCV: 101.6 fL — ABNORMAL HIGH (ref 78.0–100.0)
Monocytes Absolute: 0.2 10*3/uL (ref 0.1–1.0)
Monocytes Relative: 7 % (ref 3–12)
Neutro Abs: 1.9 10*3/uL (ref 1.7–7.7)
Neutrophils Relative %: 59 % (ref 43–77)
Platelets: 212 10*3/uL (ref 150–400)
Potassium: 4 meq/L (ref 3.5–5.3)
RBC: 4.98 M/uL (ref 4.22–5.81)
RDW: 13.7 % (ref 11.5–15.5)
Sodium: 140 meq/L (ref 135–145)
Total Bilirubin: 0.6 mg/dL (ref 0.3–1.2)
Total Protein: 7.5 g/dL (ref 6.0–8.3)
WBC: 3.3 10*3/uL — ABNORMAL LOW (ref 4.0–10.5)

## 2008-11-06 ENCOUNTER — Ambulatory Visit: Payer: Self-pay | Admitting: Infectious Diseases

## 2008-12-18 ENCOUNTER — Ambulatory Visit: Payer: Self-pay | Admitting: Infectious Diseases

## 2009-04-30 ENCOUNTER — Ambulatory Visit: Payer: Self-pay | Admitting: Infectious Diseases

## 2009-04-30 LAB — CONVERTED CEMR LAB
ALT: 50 units/L (ref 0–53)
Basophils Relative: 1 % (ref 0–1)
Chloride: 99 meq/L (ref 96–112)
Creatinine, Ser: 1.3 mg/dL (ref 0.40–1.50)
GFR calc Af Amer: >60 mL/min (ref >60)
GFR calc non Af Amer: 57 mL/min — ABNORMAL LOW (ref >60)
HIV-1 RNA Quant, Log: <1.68 (ref <1.68)
MCHC: 34.3 g/dL (ref 30.0–36.0)
Monocytes Relative: 8 % (ref 3–12)
Neutro Abs: 3.2 10*3/uL (ref 1.7–7.7)
Sodium: 138 meq/L (ref 135–145)
Total Bilirubin: 0.7 mg/dL (ref 0.3–1.2)
Total Protein: 8 g/dL (ref 6.0–8.3)

## 2009-05-19 ENCOUNTER — Ambulatory Visit: Payer: Self-pay | Admitting: Infectious Diseases

## 2009-08-17 ENCOUNTER — Ambulatory Visit: Payer: Self-pay | Admitting: Infectious Diseases

## 2009-08-17 LAB — CONVERTED CEMR LAB
ALT: 42 units/L (ref 0–53)
Alkaline Phosphatase: 90 units/L (ref 39–117)
BUN: 13 mg/dL (ref 6–23)
Basophils Absolute: 0 10*3/uL (ref 0.0–0.1)
Basophils Relative: 1 % (ref 0–1)
Calcium: 9.4 mg/dL (ref 8.4–10.5)
Chloride: 102 meq/L (ref 96–112)
Eosinophils Relative: 5 % (ref 0–5)
HCT: 51.3 % (ref 39.0–52.0)
Hemoglobin: 17.6 g/dL — ABNORMAL HIGH (ref 13.0–17.0)
Lymphocytes Relative: 29 % (ref 12–46)
MCHC: 34.3 g/dL (ref 30.0–36.0)
MCV: 95.9 fL (ref >=78.0)
Monocytes Absolute: 0.5 10*3/uL (ref 0.1–1.0)
Neutro Abs: 2.8 10*3/uL (ref 1.7–7.7)
Neutrophils Relative %: 56 % (ref 43–77)
Potassium: 4 meq/L (ref 3.5–5.3)
RDW: 13.3 % (ref 11.5–15.5)
Sodium: 140 meq/L (ref 135–145)
Total Bilirubin: 0.8 mg/dL (ref 0.3–1.2)
Total Protein: 7.8 g/dL (ref 6.0–8.3)
WBC: 4.9 10*3/uL (ref 4.0–10.5)

## 2009-09-17 ENCOUNTER — Encounter (INDEPENDENT_AMBULATORY_CARE_PROVIDER_SITE_OTHER): Payer: Self-pay | Admitting: *Deleted

## 2009-12-08 ENCOUNTER — Telehealth (INDEPENDENT_AMBULATORY_CARE_PROVIDER_SITE_OTHER): Payer: Self-pay | Admitting: *Deleted

## 2009-12-11 ENCOUNTER — Telehealth: Payer: Self-pay | Admitting: Infectious Diseases

## 2009-12-16 ENCOUNTER — Encounter: Payer: Self-pay | Admitting: Infectious Diseases

## 2009-12-16 ENCOUNTER — Telehealth (INDEPENDENT_AMBULATORY_CARE_PROVIDER_SITE_OTHER): Payer: Self-pay | Admitting: *Deleted

## 2009-12-22 ENCOUNTER — Ambulatory Visit: Payer: Self-pay | Admitting: Infectious Diseases

## 2009-12-23 ENCOUNTER — Encounter: Payer: Self-pay | Admitting: Infectious Diseases

## 2009-12-23 DIAGNOSIS — E291 Testicular hypofunction: Secondary | ICD-10-CM

## 2009-12-23 HISTORY — DX: Testicular hypofunction: E29.1

## 2009-12-23 LAB — CONVERTED CEMR LAB
ALT: 51 units/L (ref 0–53)
AST: 35 units/L (ref 0–37)
Albumin: 3.9 g/dL (ref 3.5–5.2)
Alkaline Phosphatase: 115 units/L (ref 39–117)
Glucose, Bld: 129 mg/dL — ABNORMAL HIGH (ref 70–99)
HIV 1 RNA Quant: 50 copies/mL — ABNORMAL HIGH (ref <48)
HIV-1 RNA Quant, Log: 1.7 — ABNORMAL HIGH (ref <1.68)
Lymphs Abs: 1 10*3/uL (ref 0.7–4.0)
MCV: 100.4 fL — ABNORMAL HIGH (ref >=78.0)
Monocytes Absolute: 0.5 10*3/uL (ref 0.1–1.0)
Platelets: 194 10*3/uL (ref 150–400)
Potassium: 3.8 meq/L (ref 3.5–5.3)
RDW: 14.2 % (ref 11.5–15.5)
Sodium: 141 meq/L (ref 135–145)
Total Bilirubin: 0.4 mg/dL (ref 0.3–1.2)
Total Protein: 6.8 g/dL (ref 6.0–8.3)
Triglycerides: 371 mg/dL — ABNORMAL HIGH (ref <150)
VLDL: 74 mg/dL — ABNORMAL HIGH (ref 0–40)
WBC: 4.8 10*3/uL (ref 4.0–10.5)

## 2009-12-30 ENCOUNTER — Telehealth (INDEPENDENT_AMBULATORY_CARE_PROVIDER_SITE_OTHER): Payer: Self-pay | Admitting: *Deleted

## 2010-01-07 ENCOUNTER — Ambulatory Visit: Payer: Self-pay | Admitting: Infectious Diseases

## 2010-02-08 ENCOUNTER — Telehealth: Payer: Self-pay | Admitting: Infectious Diseases

## 2010-02-09 ENCOUNTER — Telehealth: Payer: Self-pay | Admitting: Infectious Diseases

## 2010-02-10 ENCOUNTER — Telehealth (INDEPENDENT_AMBULATORY_CARE_PROVIDER_SITE_OTHER): Payer: Self-pay | Admitting: *Deleted

## 2010-04-13 ENCOUNTER — Encounter: Payer: Self-pay | Admitting: Infectious Diseases

## 2010-04-27 ENCOUNTER — Ambulatory Visit (HOSPITAL_BASED_OUTPATIENT_CLINIC_OR_DEPARTMENT_OTHER): Admission: RE | Admit: 2010-04-27 | Discharge: 2010-04-27 | Payer: Self-pay | Admitting: Otolaryngology

## 2010-04-28 ENCOUNTER — Telehealth (INDEPENDENT_AMBULATORY_CARE_PROVIDER_SITE_OTHER): Payer: Self-pay | Admitting: *Deleted

## 2010-04-28 ENCOUNTER — Ambulatory Visit: Payer: Self-pay | Admitting: Infectious Diseases

## 2010-04-28 LAB — CONVERTED CEMR LAB
AST: 83 units/L — ABNORMAL HIGH (ref 0–37)
Albumin: 4.4 g/dL (ref 3.5–5.2)
Alkaline Phosphatase: 122 units/L — ABNORMAL HIGH (ref 39–117)
Basophils Relative: 1 % (ref 0–1)
Calcium: 9.2 mg/dL (ref 8.4–10.5)
Creatinine, Ser: 1.34 mg/dL (ref 0.40–1.50)
Eosinophils Absolute: 0.1 10*3/uL (ref 0.0–0.7)
Glucose, Bld: 124 mg/dL — ABNORMAL HIGH (ref 70–99)
HCT: 43.9 % (ref 39.0–52.0)
HIV 1 RNA Quant: <48 copies/mL (ref <48)
HIV-1 RNA Quant, Log: <1.68 (ref <1.68)
Hemoglobin: 14.7 g/dL (ref 13.0–17.0)
Lymphocytes Relative: 17 % (ref 12–46)
Neutro Abs: 4.9 10*3/uL (ref 1.7–7.7)
Neutrophils Relative %: 74 % (ref 43–77)
RDW: 14 % (ref 11.5–15.5)
Total Protein: 7.7 g/dL (ref 6.0–8.3)
WBC: 6.6 10*3/uL (ref 4.0–10.5)

## 2010-05-04 ENCOUNTER — Encounter: Admission: RE | Admit: 2010-05-04 | Discharge: 2010-05-04 | Payer: Self-pay | Admitting: Otolaryngology

## 2010-05-05 ENCOUNTER — Ambulatory Visit: Payer: Self-pay | Admitting: Oncology

## 2010-05-07 ENCOUNTER — Encounter: Payer: Self-pay | Admitting: Infectious Diseases

## 2010-05-07 LAB — COMPREHENSIVE METABOLIC PANEL
ALT: 145 U/L — ABNORMAL HIGH (ref 0–53)
Albumin: 4.4 g/dL (ref 3.5–5.2)
Alkaline Phosphatase: 127 U/L — ABNORMAL HIGH (ref 39–117)
CO2: 20 mEq/L (ref 19–32)
Glucose, Bld: 94 mg/dL (ref 70–99)
Potassium: 4 mEq/L (ref 3.5–5.3)
Sodium: 139 mEq/L (ref 135–145)
Total Bilirubin: 0.7 mg/dL (ref 0.3–1.2)
Total Protein: 7.5 g/dL (ref 6.0–8.3)

## 2010-05-07 LAB — CBC WITH DIFFERENTIAL/PLATELET
BASO%: 0.6 % (ref 0.0–2.0)
Eosinophils Absolute: 0.3 10*3/uL (ref 0.0–0.5)
MCHC: 34 g/dL (ref 32.0–36.0)
MONO#: 0.5 10*3/uL (ref 0.1–0.9)
MONO%: 9.5 % (ref 0.0–14.0)
NEUT#: 3.3 10*3/uL (ref 1.5–6.5)
RBC: 4.34 10*6/uL (ref 4.20–5.82)
RDW: 13.3 % (ref 11.0–14.6)
WBC: 5.2 10*3/uL (ref 4.0–10.3)

## 2010-05-11 ENCOUNTER — Ambulatory Visit: Admission: RE | Admit: 2010-05-11 | Discharge: 2010-07-29 | Payer: Self-pay | Admitting: Radiation Oncology

## 2010-05-11 ENCOUNTER — Encounter: Payer: Self-pay | Admitting: Infectious Diseases

## 2010-05-12 ENCOUNTER — Encounter: Payer: Self-pay | Admitting: Infectious Diseases

## 2010-05-12 ENCOUNTER — Ambulatory Visit (HOSPITAL_COMMUNITY): Admission: RE | Admit: 2010-05-12 | Discharge: 2010-05-12 | Payer: Self-pay | Admitting: Oncology

## 2010-05-13 ENCOUNTER — Telehealth (INDEPENDENT_AMBULATORY_CARE_PROVIDER_SITE_OTHER): Payer: Self-pay | Admitting: Licensed Clinical Social Worker

## 2010-05-14 ENCOUNTER — Ambulatory Visit (HOSPITAL_COMMUNITY): Admission: RE | Admit: 2010-05-14 | Discharge: 2010-05-14 | Payer: Self-pay | Admitting: Oncology

## 2010-05-18 ENCOUNTER — Encounter: Admission: AD | Admit: 2010-05-18 | Discharge: 2010-05-18 | Payer: Self-pay | Admitting: Dentistry

## 2010-05-18 ENCOUNTER — Ambulatory Visit: Payer: Self-pay | Admitting: Dentistry

## 2010-05-25 ENCOUNTER — Encounter: Payer: Self-pay | Admitting: Infectious Diseases

## 2010-05-27 ENCOUNTER — Ambulatory Visit: Payer: Self-pay | Admitting: Infectious Diseases

## 2010-06-07 ENCOUNTER — Ambulatory Visit: Payer: Self-pay | Admitting: Oncology

## 2010-06-09 ENCOUNTER — Encounter: Payer: Self-pay | Admitting: Infectious Diseases

## 2010-06-09 LAB — CBC WITH DIFFERENTIAL/PLATELET
BASO%: 1.3 % (ref 0.0–2.0)
EOS%: 4.5 % (ref 0.0–7.0)
LYMPH%: 20.7 % (ref 14.0–49.0)
MCH: 33.7 pg — ABNORMAL HIGH (ref 27.2–33.4)
MCHC: 35.3 g/dL (ref 32.0–36.0)
MCV: 95.3 fL (ref 79.3–98.0)
MONO#: 0.5 10*3/uL (ref 0.1–0.9)
MONO%: 8.7 % (ref 0.0–14.0)
Platelets: 193 10*3/uL (ref 140–400)
RBC: 5.08 10*6/uL (ref 4.20–5.82)
WBC: 6.2 10*3/uL (ref 4.0–10.3)
nRBC: 0 % (ref 0–0)

## 2010-06-09 LAB — COMPREHENSIVE METABOLIC PANEL
ALT: 115 U/L — ABNORMAL HIGH (ref 0–53)
Albumin: 4 g/dL (ref 3.5–5.2)
Alkaline Phosphatase: 125 U/L — ABNORMAL HIGH (ref 39–117)
Glucose, Bld: 165 mg/dL — ABNORMAL HIGH (ref 70–99)
Potassium: 3.8 mEq/L (ref 3.5–5.3)
Sodium: 138 mEq/L (ref 135–145)
Total Protein: 7.7 g/dL (ref 6.0–8.3)

## 2010-06-15 LAB — BASIC METABOLIC PANEL
BUN: 35 mg/dL — ABNORMAL HIGH (ref 6–23)
Calcium: 9.9 mg/dL (ref 8.4–10.5)
Creatinine, Ser: 1.38 mg/dL (ref 0.40–1.50)

## 2010-06-16 ENCOUNTER — Encounter: Payer: Self-pay | Admitting: Infectious Diseases

## 2010-06-22 LAB — BASIC METABOLIC PANEL
Calcium: 9.2 mg/dL (ref 8.4–10.5)
Glucose, Bld: 117 mg/dL — ABNORMAL HIGH (ref 70–99)
Potassium: 4.3 mEq/L (ref 3.5–5.3)
Sodium: 139 mEq/L (ref 135–145)

## 2010-06-22 LAB — MAGNESIUM: Magnesium: 1.9 mg/dL (ref 1.5–2.5)

## 2010-06-29 LAB — COMPREHENSIVE METABOLIC PANEL
ALT: 66 U/L — ABNORMAL HIGH (ref 0–53)
AST: 57 U/L — ABNORMAL HIGH (ref 0–37)
Albumin: 4.4 g/dL (ref 3.5–5.2)
CO2: 18 mEq/L — ABNORMAL LOW (ref 19–32)
Calcium: 9.5 mg/dL (ref 8.4–10.5)
Chloride: 105 mEq/L (ref 96–112)
Potassium: 4.5 mEq/L (ref 3.5–5.3)

## 2010-06-29 LAB — CBC WITH DIFFERENTIAL/PLATELET
BASO%: 0.6 % (ref 0.0–2.0)
Basophils Absolute: 0 10*3/uL (ref 0.0–0.1)
EOS%: 2.6 % (ref 0.0–7.0)
HCT: 38.3 % — ABNORMAL LOW (ref 38.4–49.9)
HGB: 13.8 g/dL (ref 13.0–17.1)
MCH: 34.3 pg — ABNORMAL HIGH (ref 27.2–33.4)
MCHC: 36.1 g/dL — ABNORMAL HIGH (ref 32.0–36.0)
MONO#: 0.6 10*3/uL (ref 0.1–0.9)
NEUT%: 53.9 % (ref 39.0–75.0)
RDW: 12.5 % (ref 11.0–14.6)
WBC: 3.2 10*3/uL — ABNORMAL LOW (ref 4.0–10.3)
lymph#: 0.8 10*3/uL — ABNORMAL LOW (ref 0.9–3.3)

## 2010-06-29 LAB — MAGNESIUM: Magnesium: 2 mg/dL (ref 1.5–2.5)

## 2010-06-30 ENCOUNTER — Encounter: Payer: Self-pay | Admitting: Infectious Diseases

## 2010-07-05 ENCOUNTER — Ambulatory Visit: Payer: Self-pay | Admitting: Dentistry

## 2010-07-06 LAB — BASIC METABOLIC PANEL
BUN: 29 mg/dL — ABNORMAL HIGH (ref 6–23)
Calcium: 9.3 mg/dL (ref 8.4–10.5)
Creatinine, Ser: 1.17 mg/dL (ref 0.40–1.50)

## 2010-07-06 LAB — MAGNESIUM: Magnesium: 1.8 mg/dL (ref 1.5–2.5)

## 2010-07-07 ENCOUNTER — Ambulatory Visit: Payer: Self-pay | Admitting: Oncology

## 2010-07-07 ENCOUNTER — Ambulatory Visit (HOSPITAL_COMMUNITY): Admission: RE | Admit: 2010-07-07 | Discharge: 2010-07-07 | Payer: Self-pay | Admitting: Oncology

## 2010-07-13 ENCOUNTER — Encounter: Payer: Self-pay | Admitting: Infectious Diseases

## 2010-07-13 LAB — BASIC METABOLIC PANEL
Calcium: 8.9 mg/dL (ref 8.4–10.5)
Glucose, Bld: 86 mg/dL (ref 70–99)
Potassium: 4.4 mEq/L (ref 3.5–5.3)
Sodium: 137 mEq/L (ref 135–145)

## 2010-07-13 LAB — MAGNESIUM: Magnesium: 2 mg/dL (ref 1.5–2.5)

## 2010-07-15 ENCOUNTER — Encounter: Admission: RE | Admit: 2010-07-15 | Discharge: 2010-08-16 | Payer: Self-pay | Admitting: Radiation Oncology

## 2010-07-21 ENCOUNTER — Encounter: Payer: Self-pay | Admitting: Infectious Diseases

## 2010-07-21 LAB — COMPREHENSIVE METABOLIC PANEL
Albumin: 3.7 g/dL (ref 3.5–5.2)
Alkaline Phosphatase: 139 U/L — ABNORMAL HIGH (ref 39–117)
BUN: 18 mg/dL (ref 6–23)
Glucose, Bld: 258 mg/dL — ABNORMAL HIGH (ref 70–99)
Potassium: 4.4 mEq/L (ref 3.5–5.3)
Total Bilirubin: 0.3 mg/dL (ref 0.3–1.2)

## 2010-07-21 LAB — CBC WITH DIFFERENTIAL/PLATELET
Basophils Absolute: 0 10*3/uL (ref 0.0–0.1)
EOS%: 2.1 % (ref 0.0–7.0)
HCT: 39.2 % (ref 38.4–49.9)
HGB: 13.7 g/dL (ref 13.0–17.1)
MCH: 33 pg (ref 27.2–33.4)
MCV: 94.5 fL (ref 79.3–98.0)
MONO%: 21.4 % — ABNORMAL HIGH (ref 0.0–14.0)
NEUT%: 57.6 % (ref 39.0–75.0)
Platelets: 249 10*3/uL (ref 140–400)

## 2010-07-23 ENCOUNTER — Telehealth (INDEPENDENT_AMBULATORY_CARE_PROVIDER_SITE_OTHER): Payer: Self-pay | Admitting: *Deleted

## 2010-07-30 ENCOUNTER — Telehealth (INDEPENDENT_AMBULATORY_CARE_PROVIDER_SITE_OTHER): Payer: Self-pay | Admitting: *Deleted

## 2010-08-12 ENCOUNTER — Encounter: Payer: Self-pay | Admitting: Infectious Diseases

## 2010-08-23 ENCOUNTER — Ambulatory Visit: Payer: Self-pay | Admitting: Oncology

## 2010-08-25 ENCOUNTER — Encounter: Payer: Self-pay | Admitting: Infectious Diseases

## 2010-08-25 LAB — CBC WITH DIFFERENTIAL/PLATELET
BASO%: 0.7 % (ref 0.0–2.0)
Basophils Absolute: 0 10*3/uL (ref 0.0–0.1)
EOS%: 3.1 % (ref 0.0–7.0)
Eosinophils Absolute: 0.1 10*3/uL (ref 0.0–0.5)
HGB: 11.5 g/dL — ABNORMAL LOW (ref 13.0–17.1)
LYMPH%: 22.2 % (ref 14.0–49.0)
MCH: 35.6 pg — ABNORMAL HIGH (ref 27.2–33.4)
MCV: 100.7 fL — ABNORMAL HIGH (ref 79.3–98.0)
MONO#: 0.3 10*3/uL (ref 0.1–0.9)
Platelets: 209 10*3/uL (ref 140–400)
RBC: 3.24 10*6/uL — ABNORMAL LOW (ref 4.20–5.82)
RDW: 19.5 % — ABNORMAL HIGH (ref 11.0–14.6)
WBC: 3.4 10*3/uL — ABNORMAL LOW (ref 4.0–10.3)
lymph#: 0.8 10*3/uL — ABNORMAL LOW (ref 0.9–3.3)

## 2010-08-25 LAB — TSH: TSH: 1.86 u[IU]/mL (ref 0.350–4.500)

## 2010-08-25 LAB — COMPREHENSIVE METABOLIC PANEL
ALT: 45 U/L (ref 0–53)
Albumin: 3.9 g/dL (ref 3.5–5.2)
Calcium: 9.6 mg/dL (ref 8.4–10.5)
Chloride: 98 mEq/L (ref 96–112)
Creatinine, Ser: 1.13 mg/dL (ref 0.40–1.50)
Total Bilirubin: 0.5 mg/dL (ref 0.3–1.2)

## 2010-09-07 ENCOUNTER — Ambulatory Visit: Payer: Self-pay | Admitting: Dentistry

## 2010-10-06 ENCOUNTER — Telehealth (INDEPENDENT_AMBULATORY_CARE_PROVIDER_SITE_OTHER): Payer: Self-pay | Admitting: *Deleted

## 2010-10-11 ENCOUNTER — Encounter (INDEPENDENT_AMBULATORY_CARE_PROVIDER_SITE_OTHER): Payer: Self-pay | Admitting: *Deleted

## 2010-11-02 ENCOUNTER — Ambulatory Visit: Payer: Self-pay | Admitting: Oncology

## 2010-11-02 ENCOUNTER — Ambulatory Visit (HOSPITAL_COMMUNITY)
Admission: RE | Admit: 2010-11-02 | Discharge: 2010-11-02 | Payer: Self-pay | Source: Home / Self Care | Attending: Oncology | Admitting: Oncology

## 2010-11-04 ENCOUNTER — Encounter: Payer: Self-pay | Admitting: Infectious Diseases

## 2010-11-04 LAB — COMPREHENSIVE METABOLIC PANEL
ALT: 12 U/L (ref 0–53)
Albumin: 4.3 g/dL (ref 3.5–5.2)
BUN: 12 mg/dL (ref 6–23)
CO2: 27 mEq/L (ref 19–32)
Calcium: 9.6 mg/dL (ref 8.4–10.5)
Chloride: 107 mEq/L (ref 96–112)
Creatinine, Ser: 1.04 mg/dL (ref 0.40–1.50)
Potassium: 3.7 mEq/L (ref 3.5–5.3)

## 2010-11-04 LAB — CBC WITH DIFFERENTIAL/PLATELET
BASO%: 0.6 % (ref 0.0–2.0)
EOS%: 3.8 % (ref 0.0–7.0)
Eosinophils Absolute: 0.1 10*3/uL (ref 0.0–0.5)
LYMPH%: 35.4 % (ref 14.0–49.0)
MCHC: 35.3 g/dL (ref 32.0–36.0)
MCV: 101.6 fL — ABNORMAL HIGH (ref 79.3–98.0)
MONO%: 9.9 % (ref 0.0–14.0)
NEUT#: 1.3 10*3/uL — ABNORMAL LOW (ref 1.5–6.5)
Platelets: 162 10*3/uL (ref 140–400)
RBC: 3.61 10*6/uL — ABNORMAL LOW (ref 4.20–5.82)
RDW: 12.6 % (ref 11.0–14.6)

## 2010-11-08 ENCOUNTER — Encounter: Payer: Self-pay | Admitting: Infectious Diseases

## 2010-11-08 LAB — CONVERTED CEMR LAB
ALT: 16 units/L (ref 0–53)
AST: 20 units/L (ref 0–37)
Albumin: 4.6 g/dL (ref 3.5–5.2)
Alkaline Phosphatase: 126 units/L — ABNORMAL HIGH (ref 39–117)
BUN: 10 mg/dL (ref 6–23)
Basophils Absolute: 0 10*3/uL (ref 0.0–0.1)
Basophils Relative: 1 % (ref 0–1)
Calcium: 10.6 mg/dL — ABNORMAL HIGH (ref 8.4–10.5)
Chloride: 105 meq/L (ref 96–112)
HIV 1 RNA Quant: <20 copies/mL (ref <20)
Lymphocytes Relative: 33 % (ref 12–46)
MCHC: 33.3 g/dL (ref 30.0–36.0)
Monocytes Absolute: 0.3 10*3/uL (ref 0.1–1.0)
Neutro Abs: 1.4 10*3/uL — ABNORMAL LOW (ref 1.7–7.7)
Neutrophils Relative %: 53 % (ref 43–77)
Platelets: 167 10*3/uL (ref 150–400)
Potassium: 4.5 meq/L (ref 3.5–5.3)
RDW: 13.1 % (ref 11.5–15.5)
Sodium: 140 meq/L (ref 135–145)
Total Protein: 7.4 g/dL (ref 6.0–8.3)

## 2010-11-09 ENCOUNTER — Ambulatory Visit: Payer: Self-pay | Admitting: Infectious Diseases

## 2010-11-09 ENCOUNTER — Ambulatory Visit
Admission: RE | Admit: 2010-11-09 | Discharge: 2010-11-09 | Payer: Self-pay | Source: Home / Self Care | Attending: Otolaryngology | Admitting: Otolaryngology

## 2010-11-14 ENCOUNTER — Encounter: Payer: Self-pay | Admitting: Infectious Diseases

## 2010-11-19 ENCOUNTER — Ambulatory Visit (HOSPITAL_COMMUNITY)
Admission: RE | Admit: 2010-11-19 | Discharge: 2010-11-19 | Payer: Self-pay | Source: Home / Self Care | Attending: Oncology | Admitting: Oncology

## 2010-11-25 ENCOUNTER — Ambulatory Visit: Admit: 2010-11-25 | Payer: Self-pay | Admitting: Infectious Diseases

## 2010-12-12 ENCOUNTER — Encounter: Payer: Self-pay | Admitting: Oncology

## 2010-12-23 NOTE — Assessment & Plan Note (Signed)
Summary: F/U OV/OK PER ROB/VS   Primary Provider:  Lina Sayre MD  CC:  follow-up visit.  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 He is under care of Drs. Gaylyn Rong and Whitworth at [facility] and will soon start radiation and then chemotherapy. His HIV is under reasonable  control with CD4 300 and undectable HIV in his blood and he is on Atripla. Chronic care HIV issues updated. [name] prolonged survival are testament to his adherence to care and antiviral chemoRx. Hopefully he will respond well to his cancer management plan.  Preventive Screening-Counseling & Management  Alcohol-Tobacco     Alcohol drinks/day: 0     Smoking Status: quit     Year Quit: 35 years ago     Pack years: 1 ppd     Passive Smoke Exposure: no  Caffeine-Diet-Exercise     Caffeine use/day: tea     Does Patient Exercise: yes     Type of exercise: active around yard and house     Exercise (avg: min/session): 30-60     Times/week: 5  Safety-Violence-Falls     Seat Belt Use: yes   Current Allergies (reviewed today): ! SEPTRA ! * MEPRIN ! * DEPSONE ! SULFA Vital Signs:  Patient profile:   60 year old male Height:      70 inches (177.80 cm) Weight:      173.8 pounds (79 kg) BMI:     25.03 Temp:     98.2 degrees F (36.78 degrees C) oral Pulse rate:   77 / minute BP sitting:   159 / 85  (left arm)  Vitals Entered By: Baxter Hire) (May 27, 2010 10:09 AM) CC: follow-up visit Pain Assessment Patient in pain? no      Nutritional Status BMI of 25 - 29 = overweight Nutritional Status Detail appetite is great per patient  Have you ever been in a relationship where you felt threatened, hurt or afraid?No   Does patient need assistance? Functional Status Self care Ambulation Normal   Physical Exam  General:  alert, well-developed, well-nourished, and well-hydrated.   Mouth:  good dentition and pharynx pink  and moist. Tonsilar enlargement and healing biopsy site on right tonsil.  Lungs:  Normal respiratory effort, chest expands symmetrically. Lungs are clear to auscultation, no crackles or wheezes. Heart:  Normal rate and regular rhythm. S1 and S2 normal without gallop, murmur, click, rub or other extra sounds. Cervical Nodes:  1cm bilateral cervical nodes. Axillary Nodes:  No palpable lymphadenopathy   Impression & Recommendations:  Problem # 1:  HIV DISEASE (ICD-042)  Controlled on Atripla and will f/u in three months.   Orders: Est. Patient Level IV (95621)  Problem # 2:  ACUTE TONSILLITIS (ICD-463) Tonsilar enlargement diagnosed as samll cell cancer and localized. Will begin radiorx and chemotherapy later this month.

## 2010-12-23 NOTE — Consult Note (Signed)
Summary: [facility]   Imported By: Florinda Marker 05/31/2010 13:59:36  _____________________________________________________________________  External Attachment:    Type:   Image     Comment:   External Document

## 2010-12-23 NOTE — Progress Notes (Signed)
Summary: refill  Phone Note From Pharmacy   [facility] [address] [phone]* Call For: alprazolam  Reason for Call: Needs renewal Initial call taken by: Jennet Maduro RN,  October 06, 2010 10:48 AM    Prescriptions: ALPRAZOLAM 0.5 MG TABS (ALPRAZOLAM) take 3 tablets every 6 hours as needed for anxiety  #360 x 2   Entered by:   Jennet Maduro RN   Authorized by:   Lina Sayre MD   Signed by:   Jennet Maduro RN on 10/06/2010   Method used:   Telephoned to ...       [facility]  [facility] [address] [phone]* (retail)       [address]       [place], [place]  [address]       Ph: [phone] or [phone]       Fax: [phone]   RxID:   [id number]

## 2010-12-23 NOTE — Progress Notes (Signed)
Summary: Prior authorization pending for Oxandrolone  Phone Note From Pharmacy   Caller: [employer] insurance Request: Needs authorization from insurer Summary of Call: Received another prior authorization request for patient's Oxandrolone.  The first request was denied because there was not enough evidence to support why paitent is needing this drug.  Will try again to complete prior authorization.  Paitent may need to have a testosterone level drawn to support why is needs this medication. Will check with Angelique Blonder, RN the nurse for Dr. Maurice March to see if this was done so patient can obtain his medication  Follow-up for Phone Call        order testosterone level  Follow-up by: Lina Sayre MD,  February 11, 2010 2:49 PM  Additional Follow-up for Phone Call Additional follow up Details #1::        Angelique Blonder is to complete order . Additional Follow-up by: Paulo Fruit  BS,CPht II,MPH,  Apr 02, 2010 4:19 PM    Additional Follow-up for Phone Call Additional follow up Details #2::    Order had been placed in March 2011.  Pt. did not come in for bloodwork at that time.  Called pt., left message to come in for lab work tomorrow for this as well as  f/u bloodwork prior to 05/20/10 OV w/ Dr. Maurice March. Jennet Maduro RN  Apr 06, 2010 11:51 AM

## 2010-12-23 NOTE — Miscellaneous (Signed)
Summary: Labs  Clinical Lists Changes  Orders: Added new Test order of T-Lipid Profile [phone]) - Signed Added new Test order of T-CBC w/Diff [phone]) - Signed Added new Test order of T-CD4SP [facility]) (CD4SP) - Signed Added new Test order of T-Comprehensive Metabolic Panel [phone]) - Signed Added new Test order of T-HIV Viral Load [phone]) - Signed Added new Test order of T-RPR (Syphilis) ([id number]) - Signed     Process Orders Check Orders Results:     [facility]: ABN not required for this insurance Order queued for requisitioning for [facility]: December 22, 2009 8:53 AM  Tests Sent for requisitioning (December 22, 2009 8:53 AM):     12/22/2009: [facility] -- T-Lipid Profile [phone] (signed)     12/22/2009: [facility] -- T-CBC w/Diff [[id number]] (signed)     12/22/2009: [facility] -- T-Comprehensive Metabolic Panel [[id number]] (signed)     12/22/2009: [facility] -- T-HIV Viral Load [phone] (signed)     12/22/2009: [facility] -- T-RPR (Syphilis) [phone] (signed)

## 2010-12-23 NOTE — Consult Note (Signed)
[facility]   Imported By: Florinda Marker 06/28/2010 14:59:10  _____________________________________________________________________  External Attachment:    Type:   Image     Comment:   External Document

## 2010-12-23 NOTE — Progress Notes (Signed)
Summary: refill/mld  Phone Note Call from Patient   Caller: Patient Reason for Call: Refill Medication Summary of Call: wants refill on Xanax.  [facility] Initial call taken by: Paulo Fruit  BS,CPht II,MPH,  July 30, 2010 12:11 PM    Prescriptions: ALPRAZOLAM 0.5 MG TABS (ALPRAZOLAM) take 3 tablets every 6 hours as needed for anxiety  #360 x 2   Entered by:   Paulo Fruit  BS,CPht II,MPH   Authorized by:   Lina Sayre MD   Signed by:   Paulo Fruit  BS,CPht II,MPH on 07/30/2010   Method used:   Telephoned to ...       [facility]  [facility] [phone]* (retail)       [address]       [place], [place]  [address]       Ph: [phone] or [phone]       Fax: [phone]   RxID:   [id number]  Paulo Fruit  BS,CPht II,MPH  July 30, 2010 12:13 PM

## 2010-12-23 NOTE — Miscellaneous (Signed)
Summary: Dr. Suszanne Conners  Dr. Suszanne Conners   Imported By: Florinda Marker 04/30/2010 14:16:33  _____________________________________________________________________  External Attachment:    Type:   Image     Comment:   External Document

## 2010-12-23 NOTE — Consult Note (Signed)
[facility]   Imported By: Florinda Marker 07/23/2010 15:04:15  _____________________________________________________________________  External Attachment:    Type:   Image     Comment:   External Document

## 2010-12-23 NOTE — Progress Notes (Signed)
Summary: questions about med/TY  Phone Note Call from Patient   Caller: Patient Call For: Lina Sayre MD Summary of Call: Patient called wanting a rx for diflucan 100 mg, he stated his cancer doctor wanted to talk to talk to Dr. Maurice March to see if she could write that rx for him. I spoke with Dr. Maurice March and he stated that it was ok for the patient to have diflucan 100 mg with refills. I called Corrie Dandy, Dr. Luciano Cutter nurse, at the cancer center and relayed the message on her voicemail. Initial call taken by: Starleen Arms CMA,  May 13, 2010 5:00 PM

## 2010-12-23 NOTE — Consult Note (Signed)
Summary: Audiological Eval : Dr. Amanda Pea Teoh   Audiological Eval : Dr. Amanda Pea Teoh   Imported By: Florinda Marker 05/31/2010 15:29:31  _____________________________________________________________________  External Attachment:    Type:   Image     Comment:   External Document

## 2010-12-23 NOTE — Consult Note (Signed)
[facility]   Imported By: Florinda Marker 07/23/2010 15:10:44  _____________________________________________________________________  External Attachment:    Type:   Image     Comment:   External Document

## 2010-12-23 NOTE — Progress Notes (Signed)
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  Asked to speak to RN.  Pt. 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 Suszanne Conners, ENT MD.  RN obtained pt. signature on ROI to received records for our files.  RN faxed to Dr. Avel Sensor office.  Pt. has upcoming appt. w/ Dr. Maurice March. Jennet Maduro RN  April 28, 2010 10:31 AM

## 2010-12-23 NOTE — Progress Notes (Signed)
Summary: Pt. requests Dr. Maurice March to assess his throat, dif. swallowing/pain  Phone Note Call from Patient Call back at Home Phone [phone]   Caller: Patient Reason for Call: Talk to Doctor Summary of Call: "When I come in next time to see Dr. Maurice March next time I want him to look at my throat.  I have been having problems with pain and swallowing, especially on the right side for about a year.  I just want him to look at my throat when I come in."  [facility] RN  February 10, 2010 2:26 PM       Appended Document: Pt. requests Dr. Maurice March to assess his throat, dif. swallowing/pain Pt. walked in to clinic this PM.  C/O chronic soreness on the right side of his throat x approx 1 year.  Has been seen by ENT and dentist for this problem.  Requesting appt. to evaluate.  Scheduled appt. w/ clinic MD to evaluate.  Appended Document: Pt. requests Dr. Maurice March to assess his throat, dif. swallowing/pain Would appreciate MD calling in "magic mouthwash" for him to use until his appt. on 02/25/2010.  Please advise.

## 2010-12-23 NOTE — Assessment & Plan Note (Signed)
Summary: f/u appt. /dde   CC:  f/u.  Preventive Screening-Counseling & Management  Alcohol-Tobacco     Smoking Status: quit     Year Quit: 35 years ago     Pack years: 1 ppd   Current Allergies (reviewed today): ! SEPTRA ! * MEPRIN ! * DEPSONE ! SULFA Vital Signs:  Patient profile:   60 year old male Height:      70 inches (177.80 cm) Weight:      181.31 pounds (82.41 kg) BMI:     26.11 Temp:     98.6 degrees F (37.00 degrees C) oral Pulse rate:   82 / minute BP sitting:   163 / 106  (left arm)  Vitals Entered By: Starleen Arms CMA (January 07, 2010 11:45 AM) CC: f/u Is Patient Diabetic? Yes Did you bring your meter with you today? No Pain Assessment Patient in pain? no      Nutritional Status BMI of 25 - 29 = overweight  Does patient need assistance? Functional Status Self care Ambulation Normal    Medications Added to Medication List This Visit: 1)  Alprazolam 1 Mg Tabs (Alprazolam) .... Take 1 and 1/2 tablets every 6 hours as needed anxiety 2)  Oxandrin 10 Mg Tabs (Oxandrolone) .... Take 1 tablet by mouth two times a day  Other Orders: Est. Patient Level III (82956) Future Orders: T-CBC w/Diff ([id number]) ... 04/05/2010 T-CD4SP ([facility]) (CD4SP) ... 04/05/2010 T-Comprehensive Metabolic Panel [phone]) ... 04/05/2010 T-HIV Viral Load [phone]) ... 04/05/2010 T-Testosterone; Total [phone]) ... 02/08/2010  Patient Instructions: 1)  Please schedule a follow-up appointment in 4 months. 2)  Be sure to return for lab work one (2) week before your next appointment as scheduled. Prescriptions: OXANDRIN 10 MG TABS (OXANDROLONE) Take 1 tablet by mouth two times a day  #60 x 1   Entered by:   Starleen Arms CMA   Authorized by:   Lina Sayre MD   Signed by:   Lina Sayre MD on 02/11/2010   Method used:   Telephoned to ...       [facility] [address] [phone]* (retail)       [address]       [place], [place]  [address]       Ph: [phone] or [phone]       Fax: [phone]   RxID:   [id number] ALPRAZOLAM 1 MG TABS (ALPRAZOLAM) take 1 and 1/2 tablets every 6 hours as needed anxiety  #180 x 0   Entered and Authorized by:   Lina Sayre MD   Signed by:   Lina Sayre MD on 02/11/2010   Method used:   Telephoned to ...       [facility]  [facility] [address] [phone]* (retail)       [address]       [place], [place]  [address]       Ph: [phone] or [phone]       Fax: [phone]   RxID:   [id number] TEMAZEPAM 15 MG CAPS (TEMAZEPAM) Take 1-2  tablets by mouth at bedtime  #60 x 1   Entered and Authorized by:   Lina Sayre MD   Signed by:   Lina Sayre MD on 02/11/2010   Method used:   Telephoned to ...       [facility]  [facility] [address] [phone]* (retail)       [address]  [place], [place]  [address]       Ph: [phone] or [phone]       Fax: [phone]   RxID:   [id number]  Process Orders Check Orders Results:     [facility]: ABN not required for this insurance Tests Sent for requisitioning (February 11, 2010 2:47 PM):     04/05/2010: [facility] -- T-CBC w/Diff [[id number]] (signed)     04/05/2010: [facility] -- T-Comprehensive Metabolic Panel [[id number]] (signed)     04/05/2010: [facility] -- T-HIV Viral Load [phone] (signed)     02/08/2010: [facility] -- T-Testosterone; Total [phone] (signed)      Process Orders Check Orders Results:     [facility]: ABN not required for this insurance Tests Sent for requisitioning (February 11, 2010 2:47 PM):     04/05/2010: [facility] -- T-CBC w/Diff [[id number]] (signed)     04/05/2010: [facility] -- T-Comprehensive Metabolic Panel [[id number]] (signed)     04/05/2010: [facility] -- T-HIV Viral Load  [phone] (signed)     02/08/2010: [facility] -- T-Testosterone; Total [phone] (signed)

## 2010-12-23 NOTE — Consult Note (Signed)
[facility]   Imported By: Florinda Marker 10/29/2010 12:13:26  _____________________________________________________________________  External Attachment:    Type:   Image     Comment:   External Document

## 2010-12-23 NOTE — Medication Information (Signed)
[facility]   Imported By: Florinda Marker 12/22/2009 15:02:37  _____________________________________________________________________  External Attachment:    Type:   Image     Comment:   External Document

## 2010-12-23 NOTE — Miscellaneous (Signed)
Summary: Problem list update  Clinical Lists Changes  Problems: Added new problem of HYPOGONADISM (ICD-257.2)

## 2010-12-23 NOTE — Progress Notes (Signed)
Summary: [place] request for refill of Xanax  Phone Note From Pharmacy   Caller: [facility] [address] [phone]* Summary of Call: Message emailed to Dr. Maurice March: "Rodell Perna has an appt. with you coming up on Feb. 17th.  When he was hospitalized last June he was given a prescription for Xanax 1.5 mg every 6 hours, #540 tablets with 5 refills.  This number of tablets would last him 45 days with each refill.  He had his last refill on this prescription on 11/18/2009.  As I reviewed these numbers Mr. Renteria may be taking the medication either too frequently or incorrectly.  With him having an upcoming appt. do you want me to see how many tablets he has left and then give him enough to get him to the 01/07/10 appt. so that you can review his usage?  Please advise."   Jennet Maduro RN  December 11, 2009 4:37 PM   Follow-up for Phone Call        Angelique Blonder, Yes enough til he sees me in Feb. [name]  Tel. message left for pt.   Pt. responded that he had 4 days of medication left through Sat. Jan. 29th.  RN faxed in prescription refill for #228 Xanax 0.5 tablets [facility] [place].  Pt. informed of this rx. Jennet Maduro RN  December 16, 2009 9:03 AM        New/Updated Medications: ALPRAZOLAM 0.5 MG TABS (ALPRAZOLAM) Take 3 tablets by mouth every 6 hours prn Prescriptions: ALPRAZOLAM 0.5 MG TABS (ALPRAZOLAM) Take 3 tablets by mouth every 6 hours prn  #228 x 0   Entered by:   Jennet Maduro RN   Authorized by:   Lina Sayre MD   Signed by:   Jennet Maduro RN on 12/16/2009   Method used:   Handwritten   RxID:   [id number]

## 2010-12-23 NOTE — Consult Note (Signed)
[facility]   Imported By: Florinda Marker 07/30/2010 10:59:52  _____________________________________________________________________  External Attachment:    Type:   Image     Comment:   External Document

## 2010-12-23 NOTE — Consult Note (Signed)
Summary: [facility]   Imported By: Florinda Marker 11/29/2010 14:03:27  _____________________________________________________________________  External Attachment:    Type:   Image     Comment:   External Document

## 2010-12-23 NOTE — Progress Notes (Signed)
Summary: Prior authorization denied for Oxandrolone  Phone Note From Pharmacy   Caller[facility] [address] [phone]* Request: Needs authorization from insurer Summary of Call: Received a prior authorizaton form from patient's insurance [employer] for  his oxandrolone 10mg  tablets.  They need form to be competed by physician and include the diagnosis and ICD-9 code.  The form has been placed in [place] box in ID to complete on Thursday.  Will send with Urgent note to comapany. Initial call taken by: Paulo Fruit  BS,CPht II,MPH,  December 16, 2009 4:38 PM  Follow-up for Phone Call        Received the denial letter for patient's Oxandrolone 10mg  from his insurer.  Patient's pharmacy has been notified by fax. Follow-up by: Paulo Fruit  BS,CPht II,MPH,  December 29, 2009 1:11 PM

## 2010-12-23 NOTE — Miscellaneous (Addendum)
Summary: labs  Clinical Lists Changes  Orders: Added new Test order of T-CBC w/Diff [phone]) - Signed Added new Test order of T-CD4SP [facility]) (CD4SP) - Signed Added new Test order of T-Comprehensive Metabolic Panel [phone]) - Signed Added new Test order of T-HIV Viral Load [phone]) - Signed     Process Orders Check Orders Results:     [facility]: ABN not required for this insurance Order queued for requisitioning for [facility]: November 08, 2010 9:14 AM  Tests Sent for requisitioning (November 08, 2010 9:14 AM):     11/08/2010: [facility] -- T-CBC w/Diff [[id number]] (signed)     11/08/2010: [facility] -- T-Comprehensive Metabolic Panel [[id number]] (signed)     11/08/2010: [facility] -- T-HIV Viral Load [phone] (signed)

## 2010-12-23 NOTE — Consult Note (Signed)
[facility]   Imported By: Florinda Marker 07/01/2010 09:26:40  _____________________________________________________________________  External Attachment:    Type:   Image     Comment:   External Document

## 2010-12-23 NOTE — Consult Note (Signed)
[facility]   Imported By: Florinda Marker 08/05/2010 09:25:07  _____________________________________________________________________  External Attachment:    Type:   Image     Comment:   External Document

## 2010-12-23 NOTE — Consult Note (Signed)
Summary: Dr. Suszanne Conners  Dr. Suszanne Conners   Imported By: Florinda Marker 05/07/2010 10:50:59  _____________________________________________________________________  External Attachment:    Type:   Image     Comment:   External Document

## 2010-12-23 NOTE — Progress Notes (Signed)
Summary: Oxandrolone rx denied by mail order pharmacy  Phone Note From Pharmacy   Caller: [facility] [facility], Mail order pharmace Request: Needs authorization from insurer Summary of Call: Pt's mail order pharmacy changed as of the first of 2011.  [employer] [facility] has denied the pt's rx for oxanrolone tab 10 mg.  Pt. has an upcoming appt. with Dr. Maurice March.  Will talk with Dr. Maurice March at that appt. about required lab work.  RN talked with pt. and asked him to talk with Dr. Maurice March.  Pt. stated he would.  Jennet Maduro RN  December 30, 2009 10:30 AM

## 2010-12-23 NOTE — Progress Notes (Signed)
Summary: refill/mld  Phone Note Call from Patient   Caller: Patient Reason for Call: Refill Medication Summary of Call: Patient called requesting a refill on his  Temazepam.  patient is supposed to f/u with Maurice March in 3 months.  He just had a visit. [facility]. Initial call taken by: Paulo Fruit  BS,CPht II,MPH,  July 23, 2010 10:50 AM    Prescriptions: TEMAZEPAM 15 MG CAPS (TEMAZEPAM) Take 1-2  tablets by mouth at bedtime  #60 x 2   Entered by:   Paulo Fruit  BS,CPht II,MPH   Authorized by:   Lina Sayre MD   Signed by:   Paulo Fruit  BS,CPht II,MPH on 07/23/2010   Method used:   Telephoned to ...       [facility]  [facility] [address] [phone]* (retail)       [address]       [place], [place]  [address]       Ph: [phone] or [phone]       Fax: [phone]   RxID:   [id number]  Left message on physician pharmacy voicemail line Paulo Fruit  BS,CPht II,MPH  July 23, 2010 10:56 AM

## 2010-12-23 NOTE — Consult Note (Signed)
Summary: Dr. Amanda Pea Teoh  Dr. Janeece Riggers Darrold Span Teoh   Imported By: Florinda Marker 12/06/2010 10:28:01  _____________________________________________________________________  External Attachment:    Type:   Image     Comment:   External Document

## 2010-12-23 NOTE — Consult Note (Signed)
[facility]   Imported By: Florinda Marker 09/13/2010 10:55:31  _____________________________________________________________________  External Attachment:    Type:   Image     Comment:   External Document

## 2010-12-23 NOTE — Progress Notes (Signed)
Summary: Prior authorization approved for Atripla & refill  Phone Note From Pharmacy   [facility]  [address] [phone]* Request: Needs authorization from insurer Summary of Call: received a fax prior authorization in reference to patient's medication Atripla. Please have MD to call  for a prior authorizaztion due to cost issues. They also need a prio authrozation done for Oxandrolone as well.  The number to call is [phone]  Form being faxed for completion Initial call taken by: Paulo Fruit  BS,CPht II,MPH,  December 08, 2009 3:00 PM  Follow-up for Phone Call        Received approval for patient's Atripla. good through November 20 2038.  Pharmacy notified via fax. Follow-up by: Paulo Fruit  BS,CPht II,MPH,  December 11, 2009 3:56 PM    Prescriptions: OXANDRIN 10 MG TABS (OXANDROLONE) Take 1 tablet by mouth four times a day  #120 x 5   Entered by:   Paulo Fruit  BS,CPht II,MPH   Authorized by:   Lina Sayre MD   Signed by:   Paulo Fruit  BS,CPht II,MPH on 12/11/2009   Method used:   [facility]phoned to ...       [facility] [address] [phone]* (retail)       [address]       [place], [place]  [address]       Ph: [phone] or [phone]       Fax: [phone]   RxID:   [id number]  Paulo Fruit  BS,CPht II,MPH  December 11, 2009 3:58 PM

## 2010-12-23 NOTE — Progress Notes (Signed)
Summary: ok for generic Delatestryl  Phone Note From Pharmacy   [facility]  [address] [phone]* Details for Reason: request substitution Summary of Call: Received a fax request for the substituion of Delatestryl 200mg /vial.  His next refill is due on 02/06/10.  Fax sent back ok to dispense the generic Testoterone Enannthate per Denise,RN. Initial call taken by: Paulo Fruit  BS,CPht II,MPH,  February 08, 2010 3:43 PM

## 2010-12-23 NOTE — Medication Information (Signed)
Summary: Tax adviser   Imported By: Florinda Marker 12/16/2009 14:29:09  _____________________________________________________________________  External Attachment:    Type:   Image     Comment:   External Document

## 2010-12-23 NOTE — Miscellaneous (Signed)
  Clinical Lists Changes  Observations: Added new observation of YEARAIDSPOS: 1993  (10/11/2010 11:15)

## 2010-12-23 NOTE — Consult Note (Signed)
Summary: [facility] New Pt. Eval  [facility]   Imported By: Florinda Marker 06/21/2010 09:13:09  _____________________________________________________________________  External Attachment:    Type:   Image     Comment:   External Document

## 2011-01-13 ENCOUNTER — Encounter: Payer: Self-pay | Admitting: Infectious Diseases

## 2011-01-13 ENCOUNTER — Ambulatory Visit (INDEPENDENT_AMBULATORY_CARE_PROVIDER_SITE_OTHER): Payer: BC Managed Care – PPO | Admitting: Infectious Diseases

## 2011-01-13 DIAGNOSIS — B20 Human immunodeficiency virus [HIV] disease: Secondary | ICD-10-CM

## 2011-01-18 NOTE — Assessment & Plan Note (Signed)
Summary: F/U   Vital Signs:  Patient profile:   60 year old male Height:      70 inches (177.80 cm) Weight:      137.5 pounds (62.50 kg) BMI:     19.80 Pulse rate:   75 / minute BP sitting:   128 / 77  (right arm)  Vitals Entered By: Wendall Mola CMA Duncan Dull) (January 13, 2011 10:32 AM) CC: follow-up visit, lab results Is Patient Diabetic? No Pain Assessment Patient in pain? no      Nutritional Status BMI of 19 -24 = normal Nutritional Status Detail appetite "low"  Have you ever been in a relationship where you felt threatened, hurt or afraid?No   Does patient need assistance? Functional Status Self care Ambulation Normal Comments no missed doses of Atripla per pt.   Primary Provider:  Lina Sayre MD  CC:  follow-up visit and lab results.  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 Will f/u in 3-4 months.  Preventive Screening-Counseling & Management  Alcohol-Tobacco     Alcohol drinks/day: 0     Alcohol type: vodka     Smoking Status: quit     Year Quit: 35 years ago     Pack years: 1 ppd     Passive Smoke Exposure: no  Caffeine-Diet-Exercise     Caffeine use/day: tea     Does Patient Exercise: yes     Type of exercise: active around yard and house     Exercise (avg: min/session): 30-60     Times/week: 5  Hep-HIV-STD-Contraception     HIV Risk: no  Safety-Violence-Falls     Seat Belt Use: yes  Allergies: 1)  ! Septra 2)  ! * Meprin 3)  ! * Depsone 4)  ! Sulfa  Physical Exam  General:  alert, cachetic, and pale.   Eyes:  vision grossly intact.   Mouth:  good dentition.  No pharyngeal masses but fibrotic tonsilar fossa on right. Biopsies for f/u in 12/11 showed fibrosis but no tumor. Neck:  supple and no masses.   Lungs:  normal respiratory effort, normal breath sounds, and no dullness.   Skin:   turgor normal.  Post radiation changes to skin of neck. No masses. Cervical Nodes:  No lymphadenopathy noted   Impression & Recommendations:  Problem # 1:  HIV DISEASE (ICD-042) Assessment Unchanged  Has maintained viral suppression and will expect recovery of some of his prior CD4 response.  Orders: Est. Patient Level IV (30865)   Orders Added: 1)  Est. Patient Level IV [78469]

## 2011-01-18 NOTE — Miscellaneous (Addendum)
  Clinical Lists Changes  Orders: Added new Test order of T-CBC w/Diff [phone]) - Signed Added new Test order of T-CD4SP [facility]) (CD4SP) - Signed Added new Test order of T-Comprehensive Metabolic Panel [id number]) - Signed Added new Test order of T-HIV Viral Load [phone]) - Signed     Process Orders Check Orders Results:     [facility]: ABN not required for this insurance Tests Sent for requisitioning (January 13, 2011 9:15 AM):     05/14/2011: [facility] -- T-CBC w/Diff [[id number]] (signed)     05/14/2011: [facility] -- T-Comprehensive Metabolic Panel [[id number]] (signed)     05/14/2011: [facility] -- T-HIV Viral Load [phone] (signed)

## 2011-01-31 LAB — POCT HEMOGLOBIN-HEMACUE: Hemoglobin: 14.7 g/dL (ref 13.0–17.0)

## 2011-01-31 LAB — T-HELPER CELL (CD4) - (RCID CLINIC ONLY): CD4 T Cell Abs: 170 uL — ABNORMAL LOW (ref 400–2700)

## 2011-02-01 LAB — GLUCOSE, CAPILLARY: Glucose-Capillary: 109 mg/dL — ABNORMAL HIGH (ref 70–99)

## 2011-02-03 ENCOUNTER — Other Ambulatory Visit: Payer: Self-pay | Admitting: Oncology

## 2011-02-03 ENCOUNTER — Encounter (HOSPITAL_BASED_OUTPATIENT_CLINIC_OR_DEPARTMENT_OTHER): Payer: BC Managed Care – PPO | Admitting: Oncology

## 2011-02-03 DIAGNOSIS — Z23 Encounter for immunization: Secondary | ICD-10-CM

## 2011-02-03 DIAGNOSIS — D72819 Decreased white blood cell count, unspecified: Secondary | ICD-10-CM

## 2011-02-03 DIAGNOSIS — K1231 Oral mucositis (ulcerative) due to antineoplastic therapy: Secondary | ICD-10-CM

## 2011-02-03 DIAGNOSIS — C099 Malignant neoplasm of tonsil, unspecified: Secondary | ICD-10-CM

## 2011-02-03 LAB — COMPREHENSIVE METABOLIC PANEL
ALT: 19 U/L (ref 0–53)
AST: 21 U/L (ref 0–37)
Alkaline Phosphatase: 110 U/L (ref 39–117)
BUN: 13 mg/dL (ref 6–23)
Calcium: 9.7 mg/dL (ref 8.4–10.5)
Chloride: 105 mEq/L (ref 96–112)
Creatinine, Ser: 1.19 mg/dL (ref 0.40–1.50)
Total Bilirubin: 0.4 mg/dL (ref 0.3–1.2)

## 2011-02-03 LAB — CBC WITH DIFFERENTIAL/PLATELET
BASO%: 0.6 % (ref 0.0–2.0)
Basophils Absolute: 0 10*3/uL (ref 0.0–0.1)
EOS%: 4.3 % (ref 0.0–7.0)
HCT: 40.1 % (ref 38.4–49.9)
MCH: 33.3 pg (ref 27.2–33.4)
MCHC: 34.9 g/dL (ref 32.0–36.0)
MCV: 95.2 fL (ref 79.3–98.0)
MONO%: 12 % (ref 0.0–14.0)
NEUT%: 48.3 % (ref 39.0–75.0)
lymph#: 1.1 10*3/uL (ref 0.9–3.3)

## 2011-02-04 LAB — CBC
HCT: 36.8 % — ABNORMAL LOW (ref 39.0–52.0)
Hemoglobin: 13.1 g/dL (ref 13.0–17.0)
MCH: 33.1 pg (ref 26.0–34.0)
MCHC: 35.6 g/dL (ref 30.0–36.0)
MCV: 92.9 fL (ref 78.0–100.0)

## 2011-02-04 LAB — PROTIME-INR: INR: 1.08 (ref 0.00–1.49)

## 2011-02-04 LAB — BASIC METABOLIC PANEL
BUN: 19 mg/dL (ref 6–23)
CO2: 27 mEq/L (ref 19–32)
Glucose, Bld: 122 mg/dL — ABNORMAL HIGH (ref 70–99)
Potassium: 3.6 mEq/L (ref 3.5–5.1)
Sodium: 135 mEq/L (ref 135–145)

## 2011-02-06 LAB — GLUCOSE, CAPILLARY: Comment 1: 803201951

## 2011-02-07 LAB — POCT HEMOGLOBIN-HEMACUE: Hemoglobin: 14.8 g/dL (ref 13.0–17.0)

## 2011-02-07 LAB — BASIC METABOLIC PANEL
Chloride: 102 mEq/L (ref 96–112)
Creatinine, Ser: 1.29 mg/dL (ref 0.4–1.5)
GFR calc Af Amer: >60 mL/min (ref >60)
Potassium: 4.4 mEq/L (ref 3.5–5.1)
Sodium: 140 mEq/L (ref 135–145)

## 2011-02-07 LAB — T-HELPER CELL (CD4) - (RCID CLINIC ONLY)
CD4 % Helper T Cell: 25 % — ABNORMAL LOW (ref 33–55)
CD4 T Cell Abs: 300 uL — ABNORMAL LOW (ref 400–2700)

## 2011-02-11 LAB — T-HELPER CELL (CD4) - (RCID CLINIC ONLY): CD4 % Helper T Cell: 24 % — ABNORMAL LOW (ref 33–55)

## 2011-02-28 LAB — T-HELPER CELL (CD4) - (RCID CLINIC ONLY): CD4 % Helper T Cell: 22 % — ABNORMAL LOW (ref 33–55)

## 2011-03-29 ENCOUNTER — Other Ambulatory Visit: Payer: Self-pay | Admitting: *Deleted

## 2011-03-29 DIAGNOSIS — F419 Anxiety disorder, unspecified: Secondary | ICD-10-CM

## 2011-03-29 DIAGNOSIS — B20 Human immunodeficiency virus [HIV] disease: Secondary | ICD-10-CM

## 2011-03-29 MED ORDER — ALPRAZOLAM 0.5 MG PO TABS: 1.5000 mg | ORAL_TABLET | Freq: Four times a day (QID) | ORAL | Status: DC | PRN

## 2011-03-29 MED ORDER — TEMAZEPAM 15 MG PO CAPS: 15.0000 mg | ORAL_CAPSULE | Freq: Every evening | ORAL | Status: DC | PRN

## 2011-03-29 MED ORDER — OXANDROLONE 10 MG PO TABS: 10.0000 mg | ORAL_TABLET | Freq: Two times a day (BID) | ORAL | Status: DC

## 2011-03-30 ENCOUNTER — Ambulatory Visit (HOSPITAL_COMMUNITY)
Admission: RE | Admit: 2011-03-30 | Discharge: 2011-03-30 | Disposition: A | Discharge status: Home / Self Care | Payer: BC Managed Care – PPO | Source: Ambulatory Visit | Attending: Oncology | Admitting: Oncology

## 2011-03-30 ENCOUNTER — Encounter (HOSPITAL_COMMUNITY): Payer: Self-pay

## 2011-03-30 DIAGNOSIS — J984 Other disorders of lung: Secondary | ICD-10-CM | POA: Insufficient documentation

## 2011-03-30 DIAGNOSIS — C099 Malignant neoplasm of tonsil, unspecified: Secondary | ICD-10-CM | POA: Insufficient documentation

## 2011-03-30 HISTORY — DX: Malignant neoplasm of tonsil, unspecified: C09.9

## 2011-03-30 HISTORY — DX: Essential (primary) hypertension: I10

## 2011-03-30 MED ORDER — IOHEXOL 300 MG/ML  SOLN
100.0000 mL | Freq: Once | INTRAMUSCULAR | Status: AC | PRN
  Administered 2011-03-30: 100 mL via INTRAVENOUS

## 2011-04-08 NOTE — Consult Note (Signed)
[place]  Patient:    Christopher [name], Christopher [name]                      MRN: [id number] Proc. Date: 05/23/01 Adm. Date:  [id number] Attending:  Phifer, Harriett Sine Welcome CC:         Fransisco Hertz, M.D.  Alvester Morin, M.D.   Consultation Report  CHIEF COMPLAINT:  Right lower lobe pulmonary infiltrate and bilateral infiltrates.  HISTORY OF PRESENT ILLNESS:  This is a 60 year old white male who has had HIV positivity since 1992.  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  However, the patient has noted increased shortness of breath, increased cough, increased low grade fever.  The cough is purulent.  Sputum smears are negative for AFB and Pneumocystis.  We are asked to perform bronchoscopy for further evaluation.  The patient denies any chest pain and does not increased dyspnea with these symptoms.  PAST MEDICAL HISTORY:  Medical history of HIV.  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  MEDICATIONS: 1. Virimmune 200 mg b.i.d. 2. Ipivir 150 mg b.i.d. 3. Zerut 40 mg b.i.d. 4. Restoril 30 mg h.s. 5. Xanax 1 mg q.8h. 6. Diflucan 200 mg one daily. 7. Acyclovir one daily.  ALLERGIES:  PENICILLIN, ADAPSONE, SEPTRA.  PHYSICAL EXAMINATION:  VITAL SIGNS:  Temperature 98, blood pressure 105/75, respirations 20, pulse 80, saturation 98% on room air.  CHEST:  A few dry rales in the right base, otherwise clear.  HEART:  Regular rate and rhythm with S3, normal S2.  ABDOMEN:  Soft and nontender.  The patient is fairly well-developed, but has lost weight.  EXTREMITIES:  No edema.  LABORATORY DATA:  AFB smear and Pneumocystis smears of the sputum are negative.   White count 10,000, hemoglobin 13.9.  Sodium 138, creatinine 1.2, albumin 2.6, AST 22, ALT 36, total bilirubin 0.3.  Chest x-ray shows right lower lobe pulmonary infiltrate and also left lower lobe infiltrate.  Right is worse than left.  IMPRESSION:  This is an HIV positive patient clearly with high risk for opportunistic infection.  Differential diagnosis still includes microbacterial infection, Pneumocystis in combination with fungal infection including histoplasmosis and other types of fungal infections.  RECOMMENDATIONS:  Will persue bronchoscopy with transbronchial biopsy and bronchoalveolar lavage.  Once the results are available, further recommendations will follow. DD:  05/23/01 TD:  05/23/01 Job: [id number]

## 2011-04-08 NOTE — Op Note (Signed)
   NAME:  Christopher [name], FACE                         ACCOUNT NO.:  [id number]   MEDICAL RECORD NO.:  [id number]                   PATIENT TYPE:  AMB   LOCATION:  DAY                                  FACILITY:  [facility]   PHYSICIAN:  Timothy E. Earlene Plater, M.D.              DATE OF BIRTH:  [birth year]   DATE OF PROCEDURE:  06/10/2003  DATE OF DISCHARGE:                                 OPERATIVE REPORT   PREOPERATIVE DIAGNOSIS:  Extensive anal and perianal condylomata.   POSTOPERATIVE DIAGNOSIS:  Extensive anal and perianal condylomata.   PROCEDURE:  Laser destruction condylomata.   SURGEON:  Timothy E. Earlene Plater, M.D.   ANESTHESIA:  General.   INDICATIONS FOR PROCEDURE:  Mr. Oehlert is 38, considers himself healthy and  well. He does have AIDS, however, it is under good control with medications  and counts were all acceptable. He has been troubled with perianal anal  condylomata for years, these have been treated extensively in the past both  in the office and with CO2 laser. Because they have become more numerous and  symptomatic in spite of office treatment, he has elected to proceed with a  repeat CO2 laser destruction. I agree and understand. He was evaluated by  anesthesia, identified, and the permit signed.   DESCRIPTION OF PROCEDURE:  He was taken to the operating room, placed  supine, general endotracheal anesthesia administered. He was placed in  lithotomy position. Note, the patient had refused to remove multiple areas  of metal jewelry because of personal beliefs so these were left in place. We  did not plan to use cautery. There were tiny and small condylomata of the  skin of the penis, the scrotum, the skin of the groin and the perianal  areas. With magnification, each of these was identified and treated with the  CO2 laser set at 5 watts. These were carried out and complete review of the  skin areas was made three different times. Attention was then turned to the  anal area, there  were multiple anal condylomata in the anoderm and is well  up to the dentate line. Using the anoscope, these areas were completely  treated with the CO2 laser and destruction of the condylomata. No  complications. Tolerated it well, topical ointment applied. He was removed  to the recovery room in good condition. Written and verbal instructions were  given including Vicodin #24, refill one and he will be followed in the  office.                                               Timothy E. Earlene Plater, M.D.    TED/MEDQ  D:  06/10/2003  T:  06/10/2003  Job:  [id number]

## 2011-04-08 NOTE — Discharge Summary (Signed)
[name]. [name]  Patient:    Christopher [name], Christopher [name] Christopher [name]                      MRN: [id number] Adm. Date:  [id number] Disc. Date: [id number] Attending:  Farley [name] [name]:   Christopher [name], AI CC:         Christopher [name], M.D.   Discharge Summary  DISCHARGE DIAGNOSES: 1. 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 2. 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 3. History of diarrhea - present for the past six months. 4. History of herpes simplex virus II - currently on acyclovir.  DISCHARGE MEDICATIONS: 1. Virimmune 200 mg b.i.d. 2. Ipivir 150 mg twice a day. 3. Zerut 40 mg b.i.d. 4. Acyclovir one tablet one time a day. 5. Diflucan 200 mg once a day. 6. Xanax 1 mg t.i.d. 7. Restoril 30 mg q.h.s. 8. Zithromax 500 mg one time a day. 9. Pentamidine.  FOLLOW-UP:  Christopher [name] has a follow-up appointment with Christopher [name] with infectious disease on Thursday, May 31, 2001.  At that time a follow-up chest x-ray and possible arterial blood gas should be drawn.  PROCEDURE:  A bronchoscopy was performed by Christopher [name].  CONSULTING PHYSICIANS:  Christopher [name] with infectious disease.  Christopher [name] with pulmonary.  HISTORY OF PRESENT ILLNESS:  The patient was seen in [facility] [name] clinic on Friday, May 18, 2001, and was told to follow up with inpatient admission on Monday, May 21, 2001.  The patient upon admission had noticed increased shortness of breath, increased cough, increased low grade fever for the past several weeks.  The cough was purulent in nature.   There were sputum smears from the office visit on May 18, 2001, which were negative for AFB and Pneumocystis.  LABORATORY DATA:  CBC which revealed white blood cell count 10, hemoglobin 13.9, hematocrit 40.3, platelets 490.  Electrolyte panel was obtained with sodium 138, potassium 4.1, chloride 104, bicarb 30, BUN 15, creatinine 1.2, glucose 106.  LFTs revealed an AST 22, ALT 36, alkaline phosphatase 91, protein 7.6, and albumin 2.6.  Chest x-ray from clinic on May 18, 2001, revealed bilateral infiltrates which were patchy in nature.  HOSPITAL COURSE:  #1 - Bilateral patchy lung infiltrates.  Christopher [name] was admitted to the medical teaching B service and was placed in an isolation room for TB precautions.  He obtained an induced sputum sample on Tuesday, May 22, 2001, which was negative for PCP, AFB, and fungal.  On May 23, 2001, a second induced sputum sample was obtained which was also negative for PCP, fungal, and AFB.  At that time, pulmonary consult was obtained.  Bronchoscopy was performed on the evening of May 23, 2001.  At the bronchoscopy, bronchial washings as well as a lung biopsy was obtained.  Bronchial washings were negative for AFB, fungal, and PCP.  There was some rare white blood cells and no organisms seen.  The biopsy results showed mild inflammatory changes with normal alveoli.  Respiratory precautions were discontinued on May 24, 2001.  The patient  was advised of the above results and was sent home with a 10-day course of Zithromax with two refills and was advised to follow up with Christopher [name] in clinic next Thursday, May 31, 2001, for a probable atypical pneumonia such as Chlamydia or a viral pneumonia.  At that time, a repeat chest x-ray and possible arterial blood gas would be obtained.  If symptoms such as cough and purulent sputum production and shortness of breath do not improve, further intervention such as a VATS procedure may be necessary.  #2 - History  of positive PPD - the patient was placed in respiratory isolation room until TB was ruled out upon this hospital visit as stated above.  DISCHARGE LABORATORY DATA:  As stated in hospital course #1 for his bilateral infiltrates.  FOLLOW-UP:  With Christopher [name] in clinic on Thursday, May 31, 2001.  This was a previously scheduled appointment for the patient. DD:  05/25/01 TD:  05/25/01 Job: [id number]

## 2011-04-08 NOTE — Procedures (Signed)
[place]. [facility]  Patient:    Christopher [name], Christopher [name]                      MRN: [id number] Proc. Date: 05/23/01 Adm. Date:  [id number] Attending:  Phifer, Harriett Sine Welcome CC:         Fransisco Hertz, M.D.   Procedure Report  PROCEDURE:  Bronchoscopy.  CHIEF COMPLAINT:  Bilateral pulmonary infiltrates, HIV positive.  Evaluate for opportunistic infection.  OPERATOR:  Charlcie Cradle. Delford Field, M.D.  ANESTHESIA:  1% Xylocaine local.  PREOPERATIVE MEDICATIONS:  Demerol 80 mg IV push, Versed 9 mg IV push.  DESCRIPTION OF PROCEDURE:  The Olympus video bronchoscope was introduced through the right naris.  The upper airways were visualized and were unremarkable.  The entire tracheobronchial tree was visualized, revealed diffuse tracheobronchitis.  The patient then underwent bronchoalveolar lavage of the right middle lobe and transbronchial biopsies x 6 from the right lower lobe lateral segment.  The patient tolerated the procedure well.  There was some streaking in the left arm vein where the Demerol was infused, suggesting a potential allergic reaction.  COMPLICATIONS:  Possible Demerol allergic reaction.  IMPRESSION:  Bilateral pulmonary infiltrates, human immunodeficiency virus positive.  RECOMMENDATIONS:  Follow up pathology and microbiology and assess for allergic response to Demerol. DD:  05/23/01 TD:  05/24/01 Job: [id number]

## 2011-05-06 ENCOUNTER — Other Ambulatory Visit: Payer: Self-pay | Admitting: Oncology

## 2011-05-06 ENCOUNTER — Encounter (HOSPITAL_BASED_OUTPATIENT_CLINIC_OR_DEPARTMENT_OTHER): Payer: BC Managed Care – PPO | Admitting: Oncology

## 2011-05-06 DIAGNOSIS — D72819 Decreased white blood cell count, unspecified: Secondary | ICD-10-CM

## 2011-05-06 DIAGNOSIS — C109 Malignant neoplasm of oropharynx, unspecified: Secondary | ICD-10-CM

## 2011-05-06 DIAGNOSIS — B20 Human immunodeficiency virus [HIV] disease: Secondary | ICD-10-CM

## 2011-05-06 DIAGNOSIS — K1231 Oral mucositis (ulcerative) due to antineoplastic therapy: Secondary | ICD-10-CM

## 2011-05-06 DIAGNOSIS — C099 Malignant neoplasm of tonsil, unspecified: Secondary | ICD-10-CM

## 2011-05-06 DIAGNOSIS — Z23 Encounter for immunization: Secondary | ICD-10-CM

## 2011-05-06 LAB — COMPREHENSIVE METABOLIC PANEL
ALT: 16 U/L (ref 0–53)
AST: 20 U/L (ref 0–37)
Albumin: 4.4 g/dL (ref 3.5–5.2)
Calcium: 9.2 mg/dL (ref 8.4–10.5)
Chloride: 106 mEq/L (ref 96–112)
Potassium: 3.9 mEq/L (ref 3.5–5.3)

## 2011-05-06 LAB — CBC WITH DIFFERENTIAL/PLATELET
BASO%: 0.5 % (ref 0.0–2.0)
EOS%: 2.9 % (ref 0.0–7.0)
MCH: 34.1 pg — ABNORMAL HIGH (ref 27.2–33.4)
MCHC: 34.7 g/dL (ref 32.0–36.0)
RBC: 4.47 10*6/uL (ref 4.20–5.82)
RDW: 13.7 % (ref 11.0–14.6)
lymph#: 0.8 10*3/uL — ABNORMAL LOW (ref 0.9–3.3)

## 2011-06-13 ENCOUNTER — Other Ambulatory Visit: Payer: Self-pay | Admitting: *Deleted

## 2011-06-13 ENCOUNTER — Other Ambulatory Visit: Payer: Self-pay | Admitting: Licensed Clinical Social Worker

## 2011-06-13 DIAGNOSIS — F419 Anxiety disorder, unspecified: Secondary | ICD-10-CM

## 2011-06-13 DIAGNOSIS — B20 Human immunodeficiency virus [HIV] disease: Secondary | ICD-10-CM

## 2011-06-13 MED ORDER — TEMAZEPAM 15 MG PO CAPS: 30.0000 mg | ORAL_CAPSULE | Freq: Every evening | ORAL | Status: DC | PRN

## 2011-06-13 MED ORDER — PRAVASTATIN SODIUM 40 MG PO TABS: 40.0000 mg | ORAL_TABLET | Freq: Every day | ORAL | Status: DC

## 2011-06-13 MED ORDER — ALPRAZOLAM 0.5 MG PO TABS: 1.5000 mg | ORAL_TABLET | Freq: Four times a day (QID) | ORAL | Status: DC | PRN

## 2011-06-15 ENCOUNTER — Other Ambulatory Visit: Payer: Self-pay | Admitting: *Deleted

## 2011-06-15 DIAGNOSIS — B20 Human immunodeficiency virus [HIV] disease: Secondary | ICD-10-CM

## 2011-06-15 MED ORDER — OXANDROLONE 10 MG PO TABS: 10.0000 mg | ORAL_TABLET | Freq: Two times a day (BID) | ORAL | Status: DC

## 2011-07-12 ENCOUNTER — Other Ambulatory Visit: Payer: Self-pay | Admitting: *Deleted

## 2011-07-12 DIAGNOSIS — B20 Human immunodeficiency virus [HIV] disease: Secondary | ICD-10-CM

## 2011-07-12 MED ORDER — OXANDROLONE 10 MG PO TABS: 10.0000 mg | ORAL_TABLET | Freq: Two times a day (BID) | ORAL | Status: DC

## 2011-07-13 ENCOUNTER — Other Ambulatory Visit: Payer: Self-pay | Admitting: Dermatology

## 2011-07-28 ENCOUNTER — Other Ambulatory Visit (INDEPENDENT_AMBULATORY_CARE_PROVIDER_SITE_OTHER): Payer: BC Managed Care – PPO

## 2011-07-28 ENCOUNTER — Other Ambulatory Visit: Payer: Self-pay | Admitting: Licensed Clinical Social Worker

## 2011-07-28 DIAGNOSIS — B20 Human immunodeficiency virus [HIV] disease: Secondary | ICD-10-CM

## 2011-07-28 DIAGNOSIS — Z79899 Other long term (current) drug therapy: Secondary | ICD-10-CM

## 2011-07-28 LAB — COMPLETE METABOLIC PANEL WITH GFR
ALT: 23 U/L (ref 0–53)
Alkaline Phosphatase: 82 U/L (ref 39–117)
Creat: 1.7 mg/dL — ABNORMAL HIGH (ref 0.50–1.35)
GFR, Est African American: 50 mL/min — ABNORMAL LOW (ref >60)
GFR, Est Non African American: 41 mL/min — ABNORMAL LOW (ref >60)
Sodium: 141 mEq/L (ref 135–145)
Total Bilirubin: 0.7 mg/dL (ref 0.3–1.2)
Total Protein: 7.5 g/dL (ref 6.0–8.3)

## 2011-07-28 LAB — CBC WITH DIFFERENTIAL/PLATELET
Basophils Absolute: 0 10*3/uL (ref 0.0–0.1)
Basophils Relative: 0 % (ref 0–1)
HCT: 48.4 % (ref 39.0–52.0)
Lymphocytes Relative: 21 % (ref 12–46)
MCHC: 34.5 g/dL (ref 30.0–36.0)
Neutro Abs: 2.8 10*3/uL (ref 1.7–7.7)
Neutrophils Relative %: 65 % (ref 43–77)
Platelets: 160 10*3/uL (ref 150–400)
RDW: 14.2 % (ref 11.5–15.5)
WBC: 4.3 10*3/uL (ref 4.0–10.5)

## 2011-07-28 LAB — LIPID PANEL
HDL: 40 mg/dL (ref >39)
LDL Cholesterol: 76 mg/dL (ref 0–99)
Total CHOL/HDL Ratio: 3.8 Ratio
Triglycerides: 175 mg/dL — ABNORMAL HIGH (ref <150)

## 2011-07-29 LAB — T-HELPER CELL (CD4) - (RCID CLINIC ONLY): CD4 T Cell Abs: 170 uL — ABNORMAL LOW (ref 400–2700)

## 2011-08-03 LAB — HIV-1 RNA QUANT-NO REFLEX-BLD: HIV-1 RNA Quant, Log: <1.3 {Log} (ref <1.30)

## 2011-08-11 ENCOUNTER — Encounter: Payer: Self-pay | Admitting: Infectious Diseases

## 2011-08-11 ENCOUNTER — Ambulatory Visit (INDEPENDENT_AMBULATORY_CARE_PROVIDER_SITE_OTHER): Payer: BC Managed Care – PPO | Admitting: Infectious Diseases

## 2011-08-11 VITALS — BP 162/112 | HR 87 | Temp 98.2°F | Wt 162.0 lb

## 2011-08-11 DIAGNOSIS — Z23 Encounter for immunization: Secondary | ICD-10-CM

## 2011-08-11 DIAGNOSIS — E291 Testicular hypofunction: Secondary | ICD-10-CM

## 2011-08-11 DIAGNOSIS — I10 Essential (primary) hypertension: Secondary | ICD-10-CM

## 2011-08-11 DIAGNOSIS — B20 Human immunodeficiency virus [HIV] disease: Secondary | ICD-10-CM

## 2011-08-11 MED ORDER — TESTOSTERONE ENANTHATE 200 MG/ML IM SOLN: 200.0000 mg | INTRAMUSCULAR | Status: DC

## 2011-08-11 MED ORDER — BENAZEPRIL-HYDROCHLOROTHIAZIDE 10-12.5 MG PO TABS: 1.0000 | ORAL_TABLET | Freq: Every day | ORAL | Status: DC

## 2011-08-11 NOTE — Progress Notes (Signed)
  Subjective:    Patient ID: Christopher [name], male    DOB: 11-May-1951, 60 y.o.   MRN: [id number]  HPIWilliam has now completed all Rx of his oral cancer and biopsy f/u shows no tumor. His HIV is controlled but he has post chemo suppression of his CD4 cells currently at 170. This should rise and won't PCP prophylax. He has no complaints today.    Review of Systems  Constitutional: Negative.   HENT: Negative.   Eyes: Negative.   Respiratory: Negative.   Cardiovascular: Negative.   Gastrointestinal: Negative.   Genitourinary: Negative.   Neurological:       Post chem and radiation for oral cancer he can only taste salt and he has some pharyngeal palatal dysfunction and if he drinks too quickly he will aspirate. But he has adapted quite nicely.  Hematological: Negative.        Objective:   Physical Exam  Constitutional: He appears well-developed and well-nourished.  Eyes: Conjunctivae and EOM are normal. Pupils are equal, round, and reactive to light.  Neck: No tracheal deviation present.  Cardiovascular: Normal rate and regular rhythm.   Pulmonary/Chest: Effort normal and breath sounds normal.  Abdominal: Soft.  Lymphadenopathy:    He has no cervical adenopathy.  Psychiatric: He has a normal mood and affect. His behavior is normal.          Assessment & Plan:

## 2011-08-12 LAB — T-HELPER CELL (CD4) - (RCID CLINIC ONLY)
CD4 % Helper T Cell: 18 — ABNORMAL LOW
CD4 T Cell Abs: 240 — ABNORMAL LOW

## 2011-08-18 ENCOUNTER — Other Ambulatory Visit: Payer: Self-pay | Admitting: *Deleted

## 2011-08-18 ENCOUNTER — Ambulatory Visit
Admission: RE | Admit: 2011-08-18 | Discharge: 2011-08-18 | Disposition: A | Discharge status: Home / Self Care | Payer: BC Managed Care – PPO | Source: Ambulatory Visit | Attending: Radiation Oncology | Admitting: Radiation Oncology

## 2011-08-18 LAB — T-HELPER CELL (CD4) - (RCID CLINIC ONLY)
CD4 % Helper T Cell: 20 — ABNORMAL LOW
CD4 T Cell Abs: 220 — ABNORMAL LOW

## 2011-08-18 MED ORDER — TESTOSTERONE ENANTHATE 200 MG/ML IM SOLN: 200.0000 mg | INTRAMUSCULAR | Status: DC

## 2011-08-26 LAB — T-HELPER CELL (CD4) - (RCID CLINIC ONLY)
CD4 % Helper T Cell: 19 % — ABNORMAL LOW (ref 33–55)
CD4 T Cell Abs: 190 uL — ABNORMAL LOW (ref 400–2700)

## 2011-09-05 ENCOUNTER — Other Ambulatory Visit: Payer: Self-pay | Admitting: *Deleted

## 2011-09-05 DIAGNOSIS — F419 Anxiety disorder, unspecified: Secondary | ICD-10-CM

## 2011-09-05 MED ORDER — TEMAZEPAM 15 MG PO CAPS: 30.0000 mg | ORAL_CAPSULE | Freq: Every evening | ORAL | Status: DC | PRN

## 2011-09-05 MED ORDER — ALPRAZOLAM 0.5 MG PO TABS: 1.5000 mg | ORAL_TABLET | Freq: Four times a day (QID) | ORAL | Status: DC | PRN

## 2011-09-21 ENCOUNTER — Other Ambulatory Visit: Payer: Self-pay | Admitting: Licensed Clinical Social Worker

## 2011-09-21 DIAGNOSIS — F419 Anxiety disorder, unspecified: Secondary | ICD-10-CM

## 2011-09-21 MED ORDER — TEMAZEPAM 15 MG PO CAPS: 30.0000 mg | ORAL_CAPSULE | Freq: Every evening | ORAL | Status: DC | PRN

## 2011-09-28 ENCOUNTER — Other Ambulatory Visit: Payer: Self-pay | Admitting: Dermatology

## 2011-10-12 ENCOUNTER — Other Ambulatory Visit: Payer: Self-pay

## 2011-10-12 DIAGNOSIS — B20 Human immunodeficiency virus [HIV] disease: Secondary | ICD-10-CM

## 2011-10-12 MED ORDER — OXANDROLONE 10 MG PO TABS: 10.0000 mg | ORAL_TABLET | Freq: Two times a day (BID) | ORAL | Status: DC

## 2011-10-17 ENCOUNTER — Other Ambulatory Visit: Payer: Self-pay | Admitting: *Deleted

## 2011-10-17 DIAGNOSIS — B20 Human immunodeficiency virus [HIV] disease: Secondary | ICD-10-CM

## 2011-10-17 MED ORDER — OXANDROLONE 10 MG PO TABS: 10.0000 mg | ORAL_TABLET | Freq: Two times a day (BID) | ORAL | Status: DC

## 2011-10-24 ENCOUNTER — Encounter: Payer: Self-pay | Admitting: *Deleted

## 2011-10-28 ENCOUNTER — Encounter: Payer: Self-pay | Admitting: Oncology

## 2011-10-31 ENCOUNTER — Ambulatory Visit (HOSPITAL_BASED_OUTPATIENT_CLINIC_OR_DEPARTMENT_OTHER): Payer: BC Managed Care – PPO

## 2011-10-31 ENCOUNTER — Other Ambulatory Visit: Payer: Self-pay | Admitting: *Deleted

## 2011-10-31 ENCOUNTER — Other Ambulatory Visit: Payer: Self-pay | Admitting: Oncology

## 2011-10-31 ENCOUNTER — Ambulatory Visit (HOSPITAL_COMMUNITY)
Admission: RE | Admit: 2011-10-31 | Discharge: 2011-10-31 | Disposition: A | Discharge status: Home / Self Care | Payer: BC Managed Care – PPO | Source: Ambulatory Visit | Attending: Oncology | Admitting: Oncology

## 2011-10-31 ENCOUNTER — Other Ambulatory Visit: Payer: BC Managed Care – PPO | Admitting: Lab

## 2011-10-31 DIAGNOSIS — C099 Malignant neoplasm of tonsil, unspecified: Secondary | ICD-10-CM | POA: Insufficient documentation

## 2011-10-31 DIAGNOSIS — C109 Malignant neoplasm of oropharynx, unspecified: Secondary | ICD-10-CM

## 2011-10-31 DIAGNOSIS — I6529 Occlusion and stenosis of unspecified carotid artery: Secondary | ICD-10-CM | POA: Insufficient documentation

## 2011-10-31 LAB — CBC WITH DIFFERENTIAL/PLATELET
Basophils Absolute: 0 10*3/uL (ref 0.0–0.1)
Eosinophils Absolute: 0.1 10*3/uL (ref 0.0–0.5)
HGB: 15.6 g/dL (ref 13.0–17.1)
MONO#: 0.3 10*3/uL (ref 0.1–0.9)
NEUT#: 2.2 10*3/uL (ref 1.5–6.5)
RBC: 4.43 10*6/uL (ref 4.20–5.82)
RDW: 13.8 % (ref 11.0–14.6)
WBC: 3.7 10*3/uL — ABNORMAL LOW (ref 4.0–10.3)
lymph#: 1 10*3/uL (ref 0.9–3.3)

## 2011-10-31 LAB — CMP (CANCER CENTER ONLY)
Albumin: 3.8 g/dL (ref 3.3–5.5)
Alkaline Phosphatase: 98 U/L — ABNORMAL HIGH (ref 26–84)
BUN, Bld: 18 mg/dL (ref 7–22)
Calcium: 9.4 mg/dL (ref 8.0–10.3)
Chloride: 100 mEq/L (ref 98–108)
Glucose, Bld: 97 mg/dL (ref 73–118)
Potassium: 4.4 mEq/L (ref 3.3–4.7)
Sodium: 140 mEq/L (ref 128–145)
Total Protein: 7.8 g/dL (ref 6.4–8.1)

## 2011-10-31 LAB — BASIC METABOLIC PANEL
BUN: 17 mg/dL (ref 6–23)
Calcium: 9.8 mg/dL (ref 8.4–10.5)
Chloride: 103 mEq/L (ref 96–112)
Creatinine, Ser: 1.55 mg/dL — ABNORMAL HIGH (ref 0.50–1.35)

## 2011-10-31 MED ORDER — IOHEXOL 300 MG/ML  SOLN
80.0000 mL | Freq: Once | INTRAMUSCULAR | Status: AC | PRN
  Administered 2011-10-31: 80 mL via INTRAVENOUS

## 2011-11-01 ENCOUNTER — Encounter: Payer: Self-pay | Admitting: Internal Medicine

## 2011-11-01 ENCOUNTER — Ambulatory Visit (INDEPENDENT_AMBULATORY_CARE_PROVIDER_SITE_OTHER): Payer: BC Managed Care – PPO | Admitting: Internal Medicine

## 2011-11-01 ENCOUNTER — Telehealth: Payer: Self-pay | Admitting: *Deleted

## 2011-11-01 VITALS — BP 154/88 | HR 87 | Temp 98.3°F | Ht 70.0 in | Wt 151.0 lb

## 2011-11-01 DIAGNOSIS — Z21 Asymptomatic human immunodeficiency virus [HIV] infection status: Secondary | ICD-10-CM

## 2011-11-01 DIAGNOSIS — N50812 Left testicular pain: Secondary | ICD-10-CM

## 2011-11-01 DIAGNOSIS — B20 Human immunodeficiency virus [HIV] disease: Secondary | ICD-10-CM

## 2011-11-01 DIAGNOSIS — N509 Disorder of male genital organs, unspecified: Secondary | ICD-10-CM

## 2011-11-01 NOTE — Telephone Encounter (Signed)
States he has not had sex with his partner in several months. Did have sex with a man months ago. For the past 2 weeks he has had bloody ejaculate and a sensation in his L testicle. Unable to describe it. Denies pain. Wants blood work. Corrie Dandy made him an appt today to see Dr. Drue Second. Told him the md will examine & talk with him. I do not know if blood work will be done

## 2011-11-01 NOTE — Progress Notes (Signed)
HIV CLINIC VISIT- SICK VISIT  RFV: hematospermia, testicular pain, unprotected sex x 1  Subjective:    Patient ID: Christopher [name], male    DOB: 1951-07-10, 60 y.o.   MRN: [id number]  HPI Christopher [name] is a pleasant 60yo Male with HIV, CD4 count of 170 (20%)/ VL <20 on Atripla. 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 He reports that 4 wks ago, he engaged in unprotected oral sex (receptive partner) and "alittle" anal sex (receptive partner) after frequenting a bar in [place]. He also engaged in masturbation, which he generally does not due since he has erectile dysfunction. He stated that it took at least 30 min before ejaculating and he noticed blood in his semen. Since this episode he denied having dysuria, penile lesions, anal lesions, penile discharges. Roughly 2 wks ago, he noted some tenderness to his left testicle, no rash or lesions, but can notice some soreness when he ambulates, localized to left testicle.   He finally came to clinic today to be evaluated. It is difficult for him to talk about what had happened since it is not his usual behavior. He has concerns that he may have testicular or prostate cancer given hematospermia and testicular pain. He is also worried that he may have an STI.  Active Ambulatory Problems    Diagnosis Date Noted  . HIV DISEASE 12/01/2006  . SYPHILIS, [place], LATENT NOS 01/18/2007  . DIAB W/RENAL MANIFESTS TYPE II/UNS NOT UNCNTRL 01/03/2008  . HYPOGONADISM 12/23/2009  . HYPERLIPIDEMIA, WITH LOW HDL 01/03/2008  . HYPERTENSION 12/01/2006  . ACUTE TONSILLITIS 08/06/2008  . OTHER&UNSPECIFIED DISEASES THE ORAL SOFT TISSUES 08/18/2008  . DISORDER, KIDNEY/URETER NOS 12/01/2006  . COUGH 09/25/2008  . PROTEINURIA 12/01/2006   Resolved Ambulatory Problems    Diagnosis Date Noted  . No Resolved Ambulatory Problems   Past Medical History  Diagnosis Date  . Tonsillar cancer   . Hypertension    All:  Allergies  Allergen Reactions  . Codeine   .  Sulfamethoxazole W/Trimethoprim   . Sulfonamide Derivatives     Current Outpatient Prescriptions  Medication Sig Dispense Refill  . ALPRAZolam (XANAX) 0.5 MG tablet Take 3 tablets (1.5 mg total) by mouth every 6 (six) hours as needed for sleep. Take three tablets every six hours as needed for anxiety  360 tablet  2  . benazepril-hydrochlorthiazide (LOTENSIN HCT) 10-12.5 MG per tablet Take 1 tablet by mouth daily.  30 tablet  11  . efavirenz-emtrictabine-tenofovir (ATRIPLA) 600-200-300 MG per tablet Take 1 tablet by mouth at bedtime.        Marland Kitchen NIFEdipine (NIFEDICAL XL) 30 MG (OSM) 24 hr tablet Take 30 mg by mouth daily.        Marland Kitchen oxandrolone (OXANDRIN) 10 MG tablet Take 1 tablet (10 mg total) by mouth 2 (two) times daily.  60 tablet  2  . pravastatin (PRAVACHOL) 40 MG tablet Take 1 tablet (40 mg total) by mouth daily.  30 tablet  6  . tadalafil (CIALIS) 20 MG tablet As directed        . temazepam (RESTORIL) 15 MG capsule Take 2 capsules (30 mg total) by mouth at bedtime as needed. Take 1-2 capsules at bedtime as needed for sleep  60 capsule  2  . testosterone enanthate (DELATESTRYL) 200 MG/ML injection Inject 1 mL (200 mg total) into the muscle every 7 (seven) days.  5 mL  5   Not on any PCP proph  Review of Systems Review of Systems  Constitutional:  Negative for fever, chills, diaphoresis, activity change, appetite change, fatigue and unexpected weight change.  HENT: Negative for congestion, sore throat, rhinorrhea, sneezing, trouble swallowing and sinus pressure.  Eyes: Negative for photophobia and visual disturbance.  Respiratory: Negative for cough, chest tightness, shortness of breath, wheezing and stridor.  Cardiovascular: Negative for chest pain, palpitations and leg swelling.  Gastrointestinal: Negative for nausea, vomiting, abdominal pain, diarrhea, constipation, blood in stool, abdominal distention and anal bleeding.  Genitourinary: Negative for dysuria, hematuria, flank pain and  difficulty urinating. Only 1 episode of hematospermia. Musculoskeletal: Negative for myalgias, back pain, joint swelling, arthralgias and gait problem.  Skin: Negative for color change, pallor, rash and wound.  Neurological: Negative for dizziness, tremors, weakness and light-headedness.  Hematological: Negative for adenopathy. Does not bruise/bleed easily.  Psychiatric/Behavioral: Negative for behavioral problems, confusion, sleep disturbance, dysphoric mood, decreased concentration and agitation.       Objective:   Physical Exam  Constitutional: He is oriented to person, place, and time. No distress.       Loss of subcutaneous fat of the face/head  HENT:  Head: Normocephalic and atraumatic.  Right Ear: External ear normal.  Left Ear: External ear normal.  Mouth/Throat: Oropharynx is clear and moist. No oropharyngeal exudate.  Eyes: Pupils are equal, round, and reactive to light. Right eye exhibits no discharge. Left eye exhibits no discharge. No scleral icterus.  Cardiovascular: Normal rate and regular rhythm.  Exam reveals no gallop and no friction rub.   No murmur heard. Pulmonary/Chest: No respiratory distress. He has no wheezes. He has no rales.  Abdominal: Soft. There is no tenderness. There is no rebound and no guarding.  Genitourinary: Penis normal. No penile tenderness.       Tender left testicle to palpation but no visible rash  Musculoskeletal: Normal range of motion. He exhibits no edema and no tenderness.  Lymphadenopathy:    He has no cervical adenopathy.  Neurological: He is alert and oriented to person, place, and time.  Skin: Skin is warm and dry. No rash noted. He is not diaphoretic. No erythema.  Psychiatric:       anxious        Assessment & Plan:  - will check RPR, chlamydia, and gonorrhea  -  Also get testicular U/S  Prostate ca work-up = I have discussed with him the controversy surrounding checking PSA and that most older men can have an abnormal  level. Will defer to Dr. Maurice March. For now reassured that his testicular pain is not likely related to prostate. Will focus on checking for STI and testicular pain workup.  - hiv, will check cd4 count and viral load. If cd 4 count still < 200. Will initiate PCP proph.

## 2011-11-02 LAB — CBC WITH DIFFERENTIAL/PLATELET
Basophils Absolute: 0 10*3/uL (ref 0.0–0.1)
Eosinophils Relative: 2 % (ref 0–5)
HCT: 44.9 % (ref 39.0–52.0)
Hemoglobin: 15.6 g/dL (ref 13.0–17.0)
Lymphocytes Relative: 27 % (ref 12–46)
MCHC: 34.7 g/dL (ref 30.0–36.0)
MCV: 98.2 fL (ref 78.0–100.0)
Monocytes Absolute: 0.3 10*3/uL (ref 0.1–1.0)
Monocytes Relative: 6 % (ref 3–12)
Neutro Abs: 2.6 10*3/uL (ref 1.7–7.7)
RDW: 14 % (ref 11.5–15.5)
WBC: 4.1 10*3/uL (ref 4.0–10.5)

## 2011-11-02 LAB — T-HELPER CELL (CD4) - (RCID CLINIC ONLY): CD4 % Helper T Cell: 21 % — ABNORMAL LOW (ref 33–55)

## 2011-11-02 LAB — GC/CHLAMYDIA PROBE AMP, URINE: GC Probe Amp, Urine: NEGATIVE

## 2011-11-03 ENCOUNTER — Other Ambulatory Visit: Payer: Self-pay | Admitting: *Deleted

## 2011-11-03 NOTE — Telephone Encounter (Signed)
rec'd a fax from [facility] asking for refill of temazepam. Per chart he gets it at [facility].. I called them & he has 2 refills. I called pt. He just picked up a rx for this at [facility] yesterday. I called [facility] back & told them he would not be getting them from [facility] & to delete it from their list. Pt wants to use [facility] on [address] [phone]

## 2011-11-04 ENCOUNTER — Ambulatory Visit (HOSPITAL_BASED_OUTPATIENT_CLINIC_OR_DEPARTMENT_OTHER): Payer: BC Managed Care – PPO | Admitting: Oncology

## 2011-11-04 ENCOUNTER — Telehealth: Payer: Self-pay | Admitting: Oncology

## 2011-11-04 VITALS — BP 155/96 | HR 74 | Temp 97.5°F | Ht 70.0 in | Wt 155.8 lb

## 2011-11-04 DIAGNOSIS — N289 Disorder of kidney and ureter, unspecified: Secondary | ICD-10-CM

## 2011-11-04 DIAGNOSIS — C099 Malignant neoplasm of tonsil, unspecified: Secondary | ICD-10-CM

## 2011-11-04 DIAGNOSIS — C109 Malignant neoplasm of oropharynx, unspecified: Secondary | ICD-10-CM

## 2011-11-04 DIAGNOSIS — K117 Disturbances of salivary secretion: Secondary | ICD-10-CM

## 2011-11-04 DIAGNOSIS — B20 Human immunodeficiency virus [HIV] disease: Secondary | ICD-10-CM

## 2011-11-04 LAB — HIV-1 RNA QUANT-NO REFLEX-BLD
HIV 1 RNA Quant: <20 copies/mL (ref <20)
HIV-1 RNA Quant, Log: <1.3 {Log} (ref <1.30)

## 2011-11-04 NOTE — Telephone Encounter (Signed)
Gv pt appt for [id number] 

## 2011-11-04 NOTE — Progress Notes (Signed)
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  PAST THERAPY:  definitive concurrent chemotherapy cisplatin 100mg /m2 q3wks and daily XRT between June 09, 2010, and July 21, 2010.  CURRENT THERAPY:  watchful observation.   INTERVAL HISTORY: Christopher [name] 60 y.o. male returns for regular follow up by himself.  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  He avoids eating bread and dry foods such as chicken. He is working part-time without fatigue. He has no dysphagia, odynophagia, nausea vomiting, chest pain, focal motor weakness, bone pain.  Patient denies fatigue, headache, visual changes, confusion, drenching night sweats, palpable lymph node swelling, mucositis, odynophagia, dysphagia, nausea vomiting, jaundice, chest pain, palpitation, shortness of breath, dyspnea on exertion, productive cough, gum bleeding, epistaxis, hematemesis, hemoptysis, abdominal pain, abdominal swelling, Bergeman satiety, melena, hematochezia, hematuria, skin rash, spontaneous bleeding, joint swelling, joint pain, heat or cold intolerance, bowel bladder incontinence, back pain, focal motor weakness, paresthesia, depression, suicidal or homocidal ideation, feeling hopelessness.   MEDICAL HISTORY: Past Medical History  Diagnosis Date  . Tonsillar cancer     tonsillar ca   . Hypertension   . HIV DISEASE 12/01/2006  . SYPHILIS, [place], LATENT NOS 01/18/2007  . HYPOGONADISM 12/23/2009  . HYPERLIPIDEMIA, WITH LOW HDL 01/03/2008  . HYPERTENSION 12/01/2006    SURGICAL HISTORY: No past surgical history on file.  MEDICATIONS: Current Outpatient Prescriptions  Medication Sig Dispense Refill  . ALPRAZolam (XANAX) 0.5 MG tablet Take 3 tablets (1.5 mg total) by mouth every 6 (six) hours as needed for sleep. Take three tablets every six hours as needed for anxiety  360 tablet  2  .  benazepril-hydrochlorthiazide (LOTENSIN HCT) 10-12.5 MG per tablet Take 1 tablet by mouth daily.  30 tablet  11  . efavirenz-emtrictabine-tenofovir (ATRIPLA) 600-200-300 MG per tablet Take 1 tablet by mouth at bedtime.        Marland Kitchen oxandrolone (OXANDRIN) 10 MG tablet Take 1 tablet (10 mg total) by mouth 2 (two) times daily.  60 tablet  2  . pravastatin (PRAVACHOL) 40 MG tablet Take 1 tablet (40 mg total) by mouth daily.  30 tablet  6  . temazepam (RESTORIL) 15 MG capsule Take 2 capsules (30 mg total) by mouth at bedtime as needed. Take 1-2 capsules at bedtime as needed for sleep  60 capsule  2    ALLERGIES:  is allergic to codeine; sulfamethoxazole w/trimethoprim; and sulfonamide derivatives.  REVIEW OF SYSTEMS:  The rest of the 14-point review of system was negative.   Filed Vitals:   11/04/11 0924  BP: 155/96  Pulse: 74  Temp: 97.5 F (36.4 C)   Wt Readings from Last 3 Encounters:  11/04/11 155 lb 12.8 oz (70.67 kg)  11/01/11 151 lb (68.493 kg)  08/18/11 163 lb 1.6 oz (73.982 kg)   ECOG Performance status: 0  PHYSICAL EXAMINATION:   General:  well-nourished in no acute distress.  Eyes:  no scleral icterus.  ENT:  There were no oropharyngeal lesions on my unaided exam.  Neck was without thyromegaly.  Skin of his neck was fibrotic from history of radiation.  Lymphatics:  Negative cervical, supraclavicular or axillary adenopathy.  Respiratory: lungs were clear bilaterally without wheezing or crackles.  Cardiovascular:  Regular rate and rhythm, S1/S2, without murmur, rub or gallop.  There was no pedal edema.  GI:  abdomen was soft, flat, nontender, nondistended, without organomegaly.  Muscoloskeletal:  no spinal tenderness of palpation of vertebral spine.  Skin exam was without echymosis, petichae.  Neuro exam was nonfocal.  Patient was able to get on and off exam table without assistance.  Gait was normal.  Patient was alerted and oriented.  Attention was good.   Language was appropriate.  Mood  was normal without depression.  Speech was not pressured.  Thought content was not tangential.    LABORATORY/RADIOLOGY DATA:  Lab Results  Component Value Date   WBC 4.1 11/01/2011   HGB 15.6 11/01/2011   HCT 44.9 11/01/2011   PLT 179 11/01/2011   GLUCOSE 86 10/31/2011   GLUCOSE 97 10/31/2011   CHOL 151 07/28/2011   TRIG 175* 07/28/2011   HDL 40 07/28/2011   LDLCALC 76 07/28/2011   ALT 23 07/28/2011   AST 34 10/31/2011   NA 141 10/31/2011   NA 140 10/31/2011   K 4.0 10/31/2011   K 4.4 10/31/2011   CL 103 10/31/2011   CL 100 10/31/2011   CREATININE 1.55* 10/31/2011   CREATININE 1.6* 10/31/2011   BUN 17 10/31/2011   BUN 18 10/31/2011   CO2 30 10/31/2011   CO2 30 10/31/2011   TSH 3.081 10/31/2011   INR 1.08 07/07/2010   HGBA1C 4.9 01/03/2008   IMAGING:  I personally reviewed the CT neck and PA/lat CXR.  There was no evidence of recurrent or metastatic disease.   Dg Chest 2 View  10/31/2011  *RADIOLOGY REPORT*  Clinical Data: Oral pharyngeal cancer.  CHEST - 2 VIEW  Comparison: 10/05/2008 radiographs and PET CT 11/19/2010.  Findings: The heart size and mediastinal contours are stable. There is stable pleural thickening on the right and left perihilar scarring.  The lungs are otherwise clear.  There is no pleural effusion.  The osseous structures appear unchanged.  IMPRESSION: Stable examination.  No active cardiopulmonary process or evidence of metastatic disease.  Original Report Authenticated By: Gerrianne Scale, M.D.   Ct Soft Tissue Neck W Contrast  10/31/2011  *RADIOLOGY REPORT*  Clinical Data: 60 year old male with history of right tonsillar carcinoma status post treatment.  CT NECK WITH CONTRAST  Technique:  Multidetector CT imaging of the neck was performed with intravenous contrast.  Contrast: 80mL OMNIPAQUE IOHEXOL 300 MG/ML IV SOLN  Comparison: 03/30/2011 and earlier.  Findings: Sequelae of radiation therapy again noted including skin thickening and obscuration of the soft  tissue planes along both carotid sheaths.  No residual tonsillar mass.  No discrete pharyngeal mass or abnormal enhancement.   No right side lymphadenopathy.  Further diminished small residual left level IIB node, now less than 4 mm.  No new or increased lymph nodes.  Major vascular structures remain patent.  There is carotid atherosclerosis worse on the right.  Negative visualized brain parenchyma and orbit soft tissues. Visualized paranasal sinuses and mastoids are clear.  Negative thyroid and visualized thoracic inlet.  Negative visualized lung apices.  Degenerative changes in the spine. No acute osseous abnormality identified.  IMPRESSION: Stable and satisfactory post therapy appearance of the neck.  Original Report Authenticated By: Harley Hallmark, M.D.    ASSESSMENT AND PLAN:   1. History of oropharyngeal squamous cell carcinoma.  I discussed with Mr. Sobolewski that there is no evidence of recurrence of metastatic disease and today clinical history, physical exam, laboratory tests, and imaging.  He is not hypothryoid with hx of radiation treatment.  2. History of HIV and AIDS.  He is on Atripla per Dr. Maurice March.  3. Mild leukopenia:  Resolved.  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   5. HTN:  He is on Lotensin with still slightly elevated BP.  I advised him to follow up with his PCP for titration if appropriate.  6. Renal insufficiency:  Most likely due to history of HTN.  He had no history of HIV-related nephropathy.  His Cr today is at baseline.  7. Follow up:  he has appointment to see me in 6 months.  I advised him to follow up with ENT and RadOnc as well who can perform flexible laryngoscopy.  As he is almost 2 years out, I recommended stopping routine surveillance CT and just follow him clinically. If he has concerning symptoms, we may obtain CT at that time. 8. Primary care:  He had already  had an influenza vaccination for this year. His PCP had already referred him to GI for potential colon cancer screening.

## 2011-11-18 ENCOUNTER — Other Ambulatory Visit: Payer: Self-pay | Admitting: *Deleted

## 2011-11-18 DIAGNOSIS — F419 Anxiety disorder, unspecified: Secondary | ICD-10-CM

## 2011-11-18 MED ORDER — ALPRAZOLAM 0.5 MG PO TABS: 1.5000 mg | ORAL_TABLET | Freq: Four times a day (QID) | ORAL | Status: DC | PRN

## 2011-11-18 MED ORDER — TEMAZEPAM 15 MG PO CAPS: 30.0000 mg | ORAL_CAPSULE | Freq: Every evening | ORAL | Status: DC | PRN

## 2011-12-08 ENCOUNTER — Other Ambulatory Visit: Payer: Self-pay | Admitting: Radiation Oncology

## 2011-12-08 ENCOUNTER — Encounter: Payer: Self-pay | Admitting: Radiation Oncology

## 2011-12-08 ENCOUNTER — Ambulatory Visit
Admission: RE | Admit: 2011-12-08 | Discharge: 2011-12-08 | Disposition: A | Discharge status: Home / Self Care | Payer: BC Managed Care – PPO | Source: Ambulatory Visit | Attending: Radiation Oncology | Admitting: Radiation Oncology

## 2011-12-08 DIAGNOSIS — R739 Hyperglycemia, unspecified: Secondary | ICD-10-CM

## 2011-12-08 DIAGNOSIS — R05 Cough: Secondary | ICD-10-CM

## 2011-12-08 MED ORDER — AZITHROMYCIN 250 MG PO TABS: 500.0000 mg | ORAL_TABLET | Freq: Every day | ORAL | Status: DC

## 2011-12-08 MED ORDER — AZITHROMYCIN 250 MG PO TABS: ORAL_TABLET | ORAL | Status: DC

## 2011-12-08 NOTE — Progress Notes (Signed)
NO PROBLEMS SWALLOWING, NO SORE THROAT.  THINKS HE IS GETTING A COLD, SINUSES CONGESTED, WANTS TO KNOW IF CAN HAVE Z PACK TO CATCH IT IN TIME BEFORE IT GETS TO CHEST

## 2011-12-08 NOTE — Progress Notes (Signed)
CC:   Exie Parody, M.D. Newman Pies, MD Fransisco Hertz, M.D.  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  PREVIOUS RADIATION:  Concurrent chemoradiation to a total dose of 70 Gy completed July 29, 2010.  INTERVAL SINCE TREATMENT:  1 year 4 months.  INTERVAL HISTORY:  Mr. Christopher [name] returns for followup today.  He has had stable dry mouth.  He is developing symptoms of a cold and would like a Z-Pak for that.  He is concerned that Dr. Maurice March is retiring and worried about followup as far as his infectious disease status goes.  He had a CT scan and chest x-ray with Dr. Gaylyn Rong in December, both of which looked good and showed no evidence of recurrence.  He is seeing Dr. Gaylyn Rong again in June as well as Dr. Suszanne Conners in June.  PHYSICAL EXAMINATION:  He is a pleasant male in no distress, sitting comfortably on the exam room table.  He has no palpable cervical or supraclavicular adenopathy.  There is scarring and some skin darkening of his neck.  No palpable submandibular adenopathy.  His oropharynx is benign without evidence of thrush or lesions.  His uvula deviates to the right and he has some scar tissue in the right oropharynx which is stable.  IMPRESSION:  Locally-advanced tonsil cancer with no evidence of disease.  RECOMMENDATIONS:  Mr. Lupton looks great.  I will plan on seeing him back in September.  He has followup with Dr. Gaylyn Rong and Dr. Suszanne Conners in a few months. He knows to contact me with any questions or concerns.  I did give him a prescription for a Z-Pak at his request.  I encouraged him to set up another infectious disease doctor as soon as possible.    ______________________________ [name], M.D. SW/MEDQ  D:  12/08/2011  T:  12/08/2011  Job:  [id number]

## 2011-12-19 ENCOUNTER — Telehealth: Payer: Self-pay | Admitting: *Deleted

## 2011-12-19 NOTE — Telephone Encounter (Signed)
Patient wanted to know if we had a record of any other B/P med. Besides the Lotensin and we do not.  He is concerned that his B/P has been slightly high.  He said he would discuss with Dr. Maurice March at his appointment in March.  Advised patient that he may want to consider getting a PCP as he does have insurance.  He will discuss this with Dr. Maurice March also. Wendall Mola CMA

## 2011-12-29 ENCOUNTER — Other Ambulatory Visit: Payer: Self-pay | Admitting: Dermatology

## 2012-01-24 ENCOUNTER — Other Ambulatory Visit: Payer: Self-pay | Admitting: *Deleted

## 2012-01-24 DIAGNOSIS — B20 Human immunodeficiency virus [HIV] disease: Secondary | ICD-10-CM

## 2012-01-24 MED ORDER — EFAVIRENZ-EMTRICITAB-TENOFOVIR 600-200-300 MG PO TABS: 1.0000 | ORAL_TABLET | Freq: Every day | ORAL | Status: DC

## 2012-01-25 ENCOUNTER — Other Ambulatory Visit: Payer: Self-pay | Admitting: *Deleted

## 2012-01-25 DIAGNOSIS — B20 Human immunodeficiency virus [HIV] disease: Secondary | ICD-10-CM

## 2012-01-25 MED ORDER — EFAVIRENZ-EMTRICITAB-TENOFOVIR 600-200-300 MG PO TABS: 1.0000 | ORAL_TABLET | Freq: Every day | ORAL | Status: DC

## 2012-01-31 ENCOUNTER — Encounter: Payer: Self-pay | Admitting: Radiation Oncology

## 2012-01-31 NOTE — Progress Notes (Signed)
Patient's partner, Anabel Bene called with concerns regarding dental procedures.  Stated patient has had multiple visits to dentist and per RN note, mentioned antibiotics and muscle damage from RT.  In reading the note it sounds like there is concern for trismus vs. Osteoradionecrosis.  I had sent isodose lines regarding his treatment to Dr. Egbert Garibaldi earlier.  Unclear where apin is at this time.  Talked to patient's primary dentist who did not have notes from Dr. Egbert Garibaldi.  Only knew patient was going to see Johnna Acosta for a second opinion and Dr. Egbert Garibaldi had recommended alternative therapies.  Left message for Rosanne Ashing to call me back at [phone].  Also discussed case with Dr. Kristin Bruins who agreed to check in with Dr. Joylene Grapes and ensure he knew that patient had RT and what the pre-treatment dental evaluation had been.  SW

## 2012-02-01 ENCOUNTER — Other Ambulatory Visit: Payer: Self-pay | Admitting: Infectious Diseases

## 2012-02-01 DIAGNOSIS — B20 Human immunodeficiency virus [HIV] disease: Secondary | ICD-10-CM

## 2012-02-01 DIAGNOSIS — Z113 Encounter for screening for infections with a predominantly sexual mode of transmission: Secondary | ICD-10-CM

## 2012-02-01 DIAGNOSIS — Z79899 Other long term (current) drug therapy: Secondary | ICD-10-CM

## 2012-02-02 ENCOUNTER — Other Ambulatory Visit: Payer: BC Managed Care – PPO

## 2012-02-02 DIAGNOSIS — B20 Human immunodeficiency virus [HIV] disease: Secondary | ICD-10-CM

## 2012-02-02 DIAGNOSIS — Z79899 Other long term (current) drug therapy: Secondary | ICD-10-CM

## 2012-02-02 DIAGNOSIS — Z113 Encounter for screening for infections with a predominantly sexual mode of transmission: Secondary | ICD-10-CM

## 2012-02-02 LAB — CBC WITH DIFFERENTIAL/PLATELET
Basophils Absolute: 0 10*3/uL (ref 0.0–0.1)
Eosinophils Relative: 2 % (ref 0–5)
HCT: 41.9 % (ref 39.0–52.0)
Hemoglobin: 14.1 g/dL (ref 13.0–17.0)
Lymphocytes Relative: 15 % (ref 12–46)
Lymphs Abs: 0.6 10*3/uL — ABNORMAL LOW (ref 0.7–4.0)
MCV: 99.1 fL (ref 78.0–100.0)
Monocytes Absolute: 0.4 10*3/uL (ref 0.1–1.0)
Monocytes Relative: 10 % (ref 3–12)
Neutro Abs: 2.9 10*3/uL (ref 1.7–7.7)
RBC: 4.23 MIL/uL (ref 4.22–5.81)
WBC: 4 10*3/uL (ref 4.0–10.5)

## 2012-02-02 LAB — COMPREHENSIVE METABOLIC PANEL
AST: 19 U/L (ref 0–37)
Albumin: 4.3 g/dL (ref 3.5–5.2)
BUN: 16 mg/dL (ref 6–23)
CO2: 28 mEq/L (ref 19–32)
Calcium: 9.7 mg/dL (ref 8.4–10.5)
Chloride: 100 mEq/L (ref 96–112)
Creat: 1.5 mg/dL — ABNORMAL HIGH (ref 0.50–1.35)
Glucose, Bld: 72 mg/dL (ref 70–99)
Potassium: 3.9 mEq/L (ref 3.5–5.3)

## 2012-02-02 LAB — RPR: RPR Ser Ql: REACTIVE — AB

## 2012-02-02 LAB — LIPID PANEL: Total CHOL/HDL Ratio: 4.4 Ratio

## 2012-02-02 LAB — RPR TITER: RPR Titer: 1:1 {titer}

## 2012-02-03 LAB — T-HELPER CELL (CD4) - (RCID CLINIC ONLY)
CD4 % Helper T Cell: 21 % — ABNORMAL LOW (ref 33–55)
CD4 T Cell Abs: 150 uL — ABNORMAL LOW (ref 400–2700)

## 2012-02-06 ENCOUNTER — Telehealth (HOSPITAL_COMMUNITY): Payer: Self-pay | Admitting: Dentistry

## 2012-02-06 LAB — HIV-1 RNA QUANT-NO REFLEX-BLD: HIV 1 RNA Quant: <20 copies/mL (ref <20)

## 2012-02-06 NOTE — Telephone Encounter (Signed)
Patient evaluated this morning at Dr. Frederik Pear office. Patient found to have periapical pathology associated with tooth #31. This tooth will require dental extraction. Patient is at risk for osteoradionecrosis due to previous radiation therapy. Patient will require preoperative hyperbaric oxygen therapy, followed by dental extraction, followed by most likely additional hyperbaric oxygen therapy. Patient was placed on antibiotic therapy per Dr. Chales Salmon. Further treatment will be coordinated with Dr. Frederik Pear office and the hyperbaric oxygen therapy Center. Dr. Chales Salmon to contact Dr. Michell Heinrich of additional radiation therapy information is required. Dr. Kristin Bruins

## 2012-02-07 ENCOUNTER — Other Ambulatory Visit: Payer: Self-pay | Admitting: Internal Medicine

## 2012-02-08 ENCOUNTER — Encounter (HOSPITAL_BASED_OUTPATIENT_CLINIC_OR_DEPARTMENT_OTHER): Payer: BC Managed Care – PPO | Attending: Plastic Surgery

## 2012-02-08 ENCOUNTER — Ambulatory Visit (HOSPITAL_COMMUNITY)
Admission: RE | Admit: 2012-02-08 | Discharge: 2012-02-08 | Disposition: A | Discharge status: Home / Self Care | Payer: BC Managed Care – PPO | Source: Ambulatory Visit | Attending: Plastic Surgery | Admitting: Plastic Surgery

## 2012-02-08 ENCOUNTER — Other Ambulatory Visit (HOSPITAL_COMMUNITY): Payer: Self-pay | Admitting: Plastic Surgery

## 2012-02-08 DIAGNOSIS — B20 Human immunodeficiency virus [HIV] disease: Secondary | ICD-10-CM | POA: Insufficient documentation

## 2012-02-08 DIAGNOSIS — E291 Testicular hypofunction: Secondary | ICD-10-CM | POA: Insufficient documentation

## 2012-02-08 DIAGNOSIS — Z79899 Other long term (current) drug therapy: Secondary | ICD-10-CM | POA: Insufficient documentation

## 2012-02-08 DIAGNOSIS — Z85819 Personal history of malignant neoplasm of unspecified site of lip, oral cavity, and pharynx: Secondary | ICD-10-CM | POA: Insufficient documentation

## 2012-02-08 DIAGNOSIS — M278 Other specified diseases of jaws: Secondary | ICD-10-CM | POA: Insufficient documentation

## 2012-02-08 DIAGNOSIS — T148XXA Other injury of unspecified body region, initial encounter: Secondary | ICD-10-CM

## 2012-02-08 DIAGNOSIS — Z85810 Personal history of malignant neoplasm of tongue: Secondary | ICD-10-CM | POA: Insufficient documentation

## 2012-02-08 DIAGNOSIS — I10 Essential (primary) hypertension: Secondary | ICD-10-CM | POA: Insufficient documentation

## 2012-02-08 DIAGNOSIS — N289 Disorder of kidney and ureter, unspecified: Secondary | ICD-10-CM | POA: Insufficient documentation

## 2012-02-08 DIAGNOSIS — Y842 Radiological procedure and radiotherapy as the cause of abnormal reaction of the patient, or of later complication, without mention of misadventure at the time of the procedure: Secondary | ICD-10-CM | POA: Insufficient documentation

## 2012-02-08 DIAGNOSIS — E785 Hyperlipidemia, unspecified: Secondary | ICD-10-CM | POA: Insufficient documentation

## 2012-02-08 DIAGNOSIS — X58XXXA Exposure to other specified factors, initial encounter: Secondary | ICD-10-CM | POA: Insufficient documentation

## 2012-02-08 DIAGNOSIS — M869 Osteomyelitis, unspecified: Secondary | ICD-10-CM

## 2012-02-09 ENCOUNTER — Other Ambulatory Visit: Payer: Self-pay | Admitting: *Deleted

## 2012-02-09 ENCOUNTER — Ambulatory Visit
Admission: RE | Admit: 2012-02-09 | Payer: BC Managed Care – PPO | Source: Ambulatory Visit | Admitting: Radiation Oncology

## 2012-02-09 DIAGNOSIS — F419 Anxiety disorder, unspecified: Secondary | ICD-10-CM

## 2012-02-09 MED ORDER — TEMAZEPAM 15 MG PO CAPS: 30.0000 mg | ORAL_CAPSULE | Freq: Every evening | ORAL | Status: DC | PRN

## 2012-02-09 NOTE — Progress Notes (Signed)
[facility]  NAME:  Christopher, [name] NO.:  [id number]  MEDICAL RECORD NO.:  [id number]      DATE OF BIRTH:  Jun 25, 1951  PHYSICIAN:  Wayland Denis, DO       VISIT DATE:  02/08/2012                                  OFFICE VISIT   CHIEF COMPLAINT:  Osteoradionecrosis of the mandible.  HISTORY OF PRESENT ILLNESS:  Christopher [name] is a 61 year old gentleman who is here for evaluation of his mandibular ulcer.  He underwent treatment of a T2, N2c human papillomavirus, positive squamous cell carcinoma of the right tonsil.  He then had radiation treatment and there has been no residual wall indication of any cancer.  He is positive for osteoradionecrosis of the mandible secondary to high dose of radiation. The Panorex is consistent with the osteoradionecrosis.  Nothing seems to make it better and it is getting worse with time.  CT scan in December 2012, showed stable and satisfactory posttreatment of the neck with no sign of recurrence or metastatic disease.  PAST MEDICAL HISTORY: 1. Mild leukopenia. 2. Xerostomia. 3. Hypertension. 4. Renal insufficiency. 5. HIV and AIDS. 6. Squamous cell carcinoma of the tonsil. 7. Syphilis. 8. Hypogonadism. 9. Hyperlipidemia.  SURGICAL HISTORY:  Excision of squamous cell carcinoma of the tonsil.  ALLERGIES:  CODEINE, SULFA.  MEDICATIONS:  Alprazolam, Lotensin, Atripla, Oxandrin, Pravachol and Restoril.  SOCIAL HISTORY:  The patient lives at home.  FAMILY HISTORY:  Noncontributory.  REVIEW OF SYSTEMS:  Otherwise being handled by his primary care physician and Infectious Disease.  PHYSICAL EXAMINATION:  The patient is alert, oriented and cooperative. He is a good historian and is aware of his condition.  His pupils are equal.  Extraocular muscles are intact.  No cervical lymphadenopathy particularly on the right side.  He has a firmness on the right mandible posteriorly and at present, it is nontender.   His breathing is unlabored.  His heart is regular.  His abdomen is soft.  He is likely underweight.  He appears his stated age, but not well nourished.  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  Recommend hyperbaric oxygen treatment for 40 treatments for resolution of the osteoradionecrosis that is secondary to radiation, which is indicated by the Panorex, which occurred after his head and neck radiation.  He also received cisplatin as well.     Wayland Denis, DO     CS/MEDQ  D:  02/08/2012  T:  02/09/2012  Job:  [id number]

## 2012-02-09 NOTE — Telephone Encounter (Signed)
Patient called and advised he needs this Rx filled. Called the pharmacy and placed the order.

## 2012-02-16 ENCOUNTER — Telehealth: Payer: Self-pay | Admitting: *Deleted

## 2012-02-16 ENCOUNTER — Ambulatory Visit: Payer: BC Managed Care – PPO | Admitting: Infectious Diseases

## 2012-02-16 NOTE — Telephone Encounter (Signed)
Patient called and wants a refill on his Oxandrolone 10 mg called in. Was not sure you intended for this to be a long term medication. So advised him will have to ask you before I can fill the Rx.

## 2012-02-20 ENCOUNTER — Other Ambulatory Visit: Payer: Self-pay | Admitting: *Deleted

## 2012-02-20 ENCOUNTER — Encounter (HOSPITAL_BASED_OUTPATIENT_CLINIC_OR_DEPARTMENT_OTHER): Payer: BC Managed Care – PPO | Attending: Plastic Surgery

## 2012-02-20 DIAGNOSIS — Z85819 Personal history of malignant neoplasm of unspecified site of lip, oral cavity, and pharynx: Secondary | ICD-10-CM | POA: Insufficient documentation

## 2012-02-20 DIAGNOSIS — Z79899 Other long term (current) drug therapy: Secondary | ICD-10-CM | POA: Insufficient documentation

## 2012-02-20 DIAGNOSIS — M278 Other specified diseases of jaws: Secondary | ICD-10-CM | POA: Insufficient documentation

## 2012-02-20 DIAGNOSIS — Z21 Asymptomatic human immunodeficiency virus [HIV] infection status: Secondary | ICD-10-CM | POA: Insufficient documentation

## 2012-02-20 DIAGNOSIS — T66XXXS Radiation sickness, unspecified, sequela: Secondary | ICD-10-CM | POA: Insufficient documentation

## 2012-02-20 DIAGNOSIS — F419 Anxiety disorder, unspecified: Secondary | ICD-10-CM

## 2012-02-20 DIAGNOSIS — E785 Hyperlipidemia, unspecified: Secondary | ICD-10-CM | POA: Insufficient documentation

## 2012-02-20 DIAGNOSIS — I10 Essential (primary) hypertension: Secondary | ICD-10-CM | POA: Insufficient documentation

## 2012-02-20 DIAGNOSIS — Y842 Radiological procedure and radiotherapy as the cause of abnormal reaction of the patient, or of later complication, without mention of misadventure at the time of the procedure: Secondary | ICD-10-CM | POA: Insufficient documentation

## 2012-02-20 DIAGNOSIS — B20 Human immunodeficiency virus [HIV] disease: Secondary | ICD-10-CM

## 2012-02-20 DIAGNOSIS — N289 Disorder of kidney and ureter, unspecified: Secondary | ICD-10-CM | POA: Insufficient documentation

## 2012-02-20 MED ORDER — OXANDROLONE 10 MG PO TABS: 10.0000 mg | ORAL_TABLET | Freq: Two times a day (BID) | ORAL | Status: DC

## 2012-02-20 MED ORDER — ALPRAZOLAM 0.5 MG PO TABS: 1.5000 mg | ORAL_TABLET | Freq: Four times a day (QID) | ORAL | Status: DC | PRN

## 2012-02-20 NOTE — Telephone Encounter (Signed)
I verified the amount of alprazolam tabs to be dispensed in a month with Dr. Maurice March. I lm for pt that this has been done but the alprazolam is not due to be filled until the 17th

## 2012-02-21 ENCOUNTER — Telehealth: Payer: Self-pay | Admitting: Licensed Clinical Social Worker

## 2012-02-21 ENCOUNTER — Other Ambulatory Visit: Payer: Self-pay | Admitting: Licensed Clinical Social Worker

## 2012-02-21 DIAGNOSIS — F419 Anxiety disorder, unspecified: Secondary | ICD-10-CM

## 2012-02-21 NOTE — Telephone Encounter (Signed)
Received a refill request for xanax and it was too soon, I faxed it back stating this information.

## 2012-02-28 ENCOUNTER — Ambulatory Visit: Payer: BC Managed Care – PPO | Admitting: Internal Medicine

## 2012-03-07 ENCOUNTER — Other Ambulatory Visit: Payer: Self-pay | Admitting: Infectious Diseases

## 2012-03-07 ENCOUNTER — Encounter (HOSPITAL_BASED_OUTPATIENT_CLINIC_OR_DEPARTMENT_OTHER): Payer: BC Managed Care – PPO

## 2012-03-15 ENCOUNTER — Ambulatory Visit: Payer: BC Managed Care – PPO | Admitting: Radiation Oncology

## 2012-03-21 ENCOUNTER — Encounter (HOSPITAL_BASED_OUTPATIENT_CLINIC_OR_DEPARTMENT_OTHER): Payer: BC Managed Care – PPO | Attending: Plastic Surgery

## 2012-03-21 DIAGNOSIS — Z21 Asymptomatic human immunodeficiency virus [HIV] infection status: Secondary | ICD-10-CM | POA: Insufficient documentation

## 2012-03-21 DIAGNOSIS — Z79899 Other long term (current) drug therapy: Secondary | ICD-10-CM | POA: Insufficient documentation

## 2012-03-21 DIAGNOSIS — L98499 Non-pressure chronic ulcer of skin of other sites with unspecified severity: Secondary | ICD-10-CM | POA: Insufficient documentation

## 2012-03-21 DIAGNOSIS — N289 Disorder of kidney and ureter, unspecified: Secondary | ICD-10-CM | POA: Insufficient documentation

## 2012-03-21 DIAGNOSIS — M278 Other specified diseases of jaws: Secondary | ICD-10-CM | POA: Insufficient documentation

## 2012-03-21 DIAGNOSIS — E785 Hyperlipidemia, unspecified: Secondary | ICD-10-CM | POA: Insufficient documentation

## 2012-03-21 DIAGNOSIS — C099 Malignant neoplasm of tonsil, unspecified: Secondary | ICD-10-CM | POA: Insufficient documentation

## 2012-03-21 DIAGNOSIS — Y842 Radiological procedure and radiotherapy as the cause of abnormal reaction of the patient, or of later complication, without mention of misadventure at the time of the procedure: Secondary | ICD-10-CM | POA: Insufficient documentation

## 2012-03-21 DIAGNOSIS — I10 Essential (primary) hypertension: Secondary | ICD-10-CM | POA: Insufficient documentation

## 2012-03-29 ENCOUNTER — Encounter: Payer: Self-pay | Admitting: Radiation Oncology

## 2012-03-29 ENCOUNTER — Ambulatory Visit
Admission: RE | Admit: 2012-03-29 | Discharge: 2012-03-29 | Disposition: A | Discharge status: Home / Self Care | Payer: BC Managed Care – PPO | Source: Ambulatory Visit | Attending: Radiation Oncology | Admitting: Radiation Oncology

## 2012-03-29 DIAGNOSIS — M272 Inflammatory conditions of jaws: Secondary | ICD-10-CM | POA: Insufficient documentation

## 2012-03-29 NOTE — Progress Notes (Signed)
Pt has concerns w/tooth he states became infected and was extracted. He wishes to discuss with dr today.   Pt has dry mouth, has to alter his eating due to this. Pt denies pain, fatigue, loss of appetite today.

## 2012-03-29 NOTE — Progress Notes (Signed)
Department of Radiation Oncology  Phone:  [phone] Fax:        [phone]   Name: Christopher [name]   DOB: [birth year]  MRN: [id number]    Date: 03/29/2012  Follow Up Visit Note  CC: Christopher Sayre, MD  Diagnosis: T2 N2C squamous cell carcinoma of the right tonsil  Interval since last radiation: Almost 2 years  Allergies:  Allergies  Allergen Reactions  . Codeine   . Sulfamethoxazole W-Trimethoprim   . Sulfonamide Derivatives     Medications:  Current Outpatient Prescriptions  Medication Sig Dispense Refill  . ALPRAZolam (XANAX) 0.5 MG tablet Take 3 tablets (1.5 mg total) by mouth every 6 (six) hours as needed for sleep. Take three tablets every six hours as needed for anxiety  360 tablet  2  . benazepril-hydrochlorthiazide (LOTENSIN HCT) 10-12.5 MG per tablet Take 1 tablet by mouth daily.  30 tablet  11  . efavirenz-emtrictabine-tenofovir (ATRIPLA) 600-200-300 MG per tablet Take 1 tablet by mouth at bedtime.  30 tablet  2  . oxandrolone (OXANDRIN) 10 MG tablet Take 1 tablet (10 mg total) by mouth 2 (two) times daily.  60 tablet  2  . pravastatin (PRAVACHOL) 40 MG tablet TAKE 1 TABLET BY MOUTH EVERY DAY  30 tablet  0  . temazepam (RESTORIL) 15 MG capsule Take 2 capsules (30 mg total) by mouth at bedtime as needed. Take 1-2 capsules at bedtime as needed for sleep  60 capsule  2    Interval History: Christopher [name] presents today for routine followup.  He had his right tooth removed by an oral surgeon. He is currently undergoing hyperbaric oxygen treatments. He is tolerating that well. He is much improved compared to how he felt in January and February. He is still very concerned about the difficulty had been being referred to the appropriate physician. He would really like to do more to ensure this does not happen to anyone in the future. He still follows with Christopher [name] regarding his HIV. He is able to open his mouth better and is eating more. He still struggles with dry mouth. He has  an appointment with his oral surgeon next week. He has pain in his jaw but nothing in his throat.  Physical Exam:   weight is 150 lb 1.6 oz (68.085 kg). His oral temperature is 97.3 F (36.3 C). His blood pressure is 141/84 and his pulse is 97. His respiration is 20.  He has no palpable cervical or supraclavicular adenopathy. He has no palpable submental adenopathy. Examination of his oropharynx shows post surgical changes with a large surgical defect in his posterior right mandible. Nothing appears infected or inflamed. He has no signs of disease recurrence in the right tonsillar fossa or base of tongue. His left tonsillar fossa and base of tongue are normal as well.  IMPRESSION: T2 N2C squamous cell carcinoma of the right tonsil status post chemoradiation with resulting osteoradionecrosis  PLAN:  Christopher [name] is a 61 y.o. male who clearly has had a significant toxicity due to treatments. He is receiving appropriate care. I apologize to him in the difficulty that he had a obtaining the correct referral. I agree that a centralized location for patients with head and neck cancer to call would be ideal. I think you would also be worthwhile to look at our processes in referring patients for osteoradionecrosis. I spoke with him and told him I would get back to him later this summer regarding the progress is made been made in this  manner. I will contact Drs. Kulinski, Mohern and Dr. Chales Salmon to see what the best avenue for this would be. Also discuss with our quality improvement committee to see if this would be an appropriate topic for discussion. I made a followup appointment with him in 3 months. He knows he can reschedule this at his convenience. He did request the address of our vice president of oncology services to write a letter explaining his experience. I've given him this information.    [name], MD

## 2012-04-04 ENCOUNTER — Other Ambulatory Visit: Payer: Self-pay | Admitting: Infectious Diseases

## 2012-04-04 DIAGNOSIS — E7800 Pure hypercholesterolemia, unspecified: Secondary | ICD-10-CM

## 2012-04-20 ENCOUNTER — Other Ambulatory Visit: Payer: Self-pay | Admitting: *Deleted

## 2012-04-20 DIAGNOSIS — F419 Anxiety disorder, unspecified: Secondary | ICD-10-CM

## 2012-04-20 MED ORDER — TEMAZEPAM 15 MG PO CAPS: 30.0000 mg | ORAL_CAPSULE | Freq: Every evening | ORAL | Status: DC | PRN

## 2012-04-25 ENCOUNTER — Encounter (HOSPITAL_BASED_OUTPATIENT_CLINIC_OR_DEPARTMENT_OTHER): Payer: BC Managed Care – PPO | Attending: Plastic Surgery

## 2012-04-25 DIAGNOSIS — Z79899 Other long term (current) drug therapy: Secondary | ICD-10-CM | POA: Insufficient documentation

## 2012-04-25 DIAGNOSIS — N289 Disorder of kidney and ureter, unspecified: Secondary | ICD-10-CM | POA: Insufficient documentation

## 2012-04-25 DIAGNOSIS — C099 Malignant neoplasm of tonsil, unspecified: Secondary | ICD-10-CM | POA: Insufficient documentation

## 2012-04-25 DIAGNOSIS — Y842 Radiological procedure and radiotherapy as the cause of abnormal reaction of the patient, or of later complication, without mention of misadventure at the time of the procedure: Secondary | ICD-10-CM | POA: Insufficient documentation

## 2012-04-25 DIAGNOSIS — E785 Hyperlipidemia, unspecified: Secondary | ICD-10-CM | POA: Insufficient documentation

## 2012-04-25 DIAGNOSIS — M278 Other specified diseases of jaws: Secondary | ICD-10-CM | POA: Insufficient documentation

## 2012-04-25 DIAGNOSIS — I10 Essential (primary) hypertension: Secondary | ICD-10-CM | POA: Insufficient documentation

## 2012-04-25 DIAGNOSIS — L98499 Non-pressure chronic ulcer of skin of other sites with unspecified severity: Secondary | ICD-10-CM | POA: Insufficient documentation

## 2012-04-25 DIAGNOSIS — Z21 Asymptomatic human immunodeficiency virus [HIV] infection status: Secondary | ICD-10-CM | POA: Insufficient documentation

## 2012-04-27 ENCOUNTER — Telehealth: Payer: Self-pay | Admitting: Oncology

## 2012-04-27 NOTE — Telephone Encounter (Signed)
called pt and informed him that his appt time on 06/12 was  changed to start 08:15

## 2012-05-01 ENCOUNTER — Other Ambulatory Visit (HOSPITAL_COMMUNITY): Payer: Self-pay | Admitting: Oral and Maxillofacial Surgery

## 2012-05-01 ENCOUNTER — Encounter: Payer: Self-pay | Admitting: Oral and Maxillofacial Surgery

## 2012-05-02 ENCOUNTER — Ambulatory Visit (HOSPITAL_BASED_OUTPATIENT_CLINIC_OR_DEPARTMENT_OTHER): Payer: BC Managed Care – PPO | Admitting: Oncology

## 2012-05-02 ENCOUNTER — Other Ambulatory Visit (HOSPITAL_BASED_OUTPATIENT_CLINIC_OR_DEPARTMENT_OTHER): Payer: BC Managed Care – PPO

## 2012-05-02 ENCOUNTER — Encounter: Payer: Self-pay | Admitting: Oncology

## 2012-05-02 ENCOUNTER — Ambulatory Visit: Payer: BC Managed Care – PPO | Admitting: Oncology

## 2012-05-02 ENCOUNTER — Telehealth: Payer: Self-pay | Admitting: Oncology

## 2012-05-02 ENCOUNTER — Other Ambulatory Visit: Payer: BC Managed Care – PPO | Admitting: Lab

## 2012-05-02 VITALS — BP 96/62 | HR 71 | Temp 97.1°F | Ht 70.0 in | Wt 151.7 lb

## 2012-05-02 DIAGNOSIS — C109 Malignant neoplasm of oropharynx, unspecified: Secondary | ICD-10-CM

## 2012-05-02 DIAGNOSIS — Z21 Asymptomatic human immunodeficiency virus [HIV] infection status: Secondary | ICD-10-CM

## 2012-05-02 DIAGNOSIS — D649 Anemia, unspecified: Secondary | ICD-10-CM

## 2012-05-02 DIAGNOSIS — K117 Disturbances of salivary secretion: Secondary | ICD-10-CM

## 2012-05-02 DIAGNOSIS — M278 Other specified diseases of jaws: Secondary | ICD-10-CM

## 2012-05-02 DIAGNOSIS — Z85819 Personal history of malignant neoplasm of unspecified site of lip, oral cavity, and pharynx: Secondary | ICD-10-CM

## 2012-05-02 LAB — CBC WITH DIFFERENTIAL/PLATELET
Basophils Absolute: 0 10*3/uL (ref 0.0–0.1)
Eosinophils Absolute: 0.2 10*3/uL (ref 0.0–0.5)
HCT: 33.2 % — ABNORMAL LOW (ref 38.4–49.9)
HGB: 11.5 g/dL — ABNORMAL LOW (ref 13.0–17.1)
LYMPH%: 11.5 % — ABNORMAL LOW (ref 14.0–49.0)
MCV: 93.1 fL (ref 79.3–98.0)
MONO%: 9.3 % (ref 0.0–14.0)
NEUT#: 4.9 10*3/uL (ref 1.5–6.5)
Platelets: 329 10*3/uL (ref 140–400)

## 2012-05-02 LAB — TSH: TSH: 7.921 u[IU]/mL — ABNORMAL HIGH (ref 0.350–4.500)

## 2012-05-02 LAB — COMPREHENSIVE METABOLIC PANEL
Albumin: 3.9 g/dL (ref 3.5–5.2)
Alkaline Phosphatase: 96 U/L (ref 39–117)
BUN: 39 mg/dL — ABNORMAL HIGH (ref 6–23)
Glucose, Bld: 113 mg/dL — ABNORMAL HIGH (ref 70–99)
Potassium: 4.3 mEq/L (ref 3.5–5.3)
Total Bilirubin: 0.4 mg/dL (ref 0.3–1.2)

## 2012-05-02 MED ORDER — LEVOTHYROXINE SODIUM 50 MCG PO TABS: 50.0000 ug | ORAL_TABLET | Freq: Every day | ORAL | Status: DC

## 2012-05-02 NOTE — Telephone Encounter (Signed)
gv pt appt schedule for dec.  °

## 2012-05-02 NOTE — Progress Notes (Signed)
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  PAST THERAPY:  definitive concurrent chemotherapy cisplatin 100mg /m2 q3wks and daily XRT between June 09, 2010, and July 21, 2010.  CURRENT THERAPY:  watchful observation.  INTERVAL HISTORY: Christopher [name] 61 y.o. male returns for regular follow up by himself.  States that he had to have a tooth extracted from his bottom jaw due to infection. He has completed 8 weeks in the hyperbaric chamber. Now only able to eat liquids and cannot open mouth very wide. Weight is stable. Having pain to his right mandible and taking Vicodin every 4 hours. He is working part-time without fatigue. He has no dysphagia, odynophagia, nausea vomiting, chest pain, focal motor weakness, bone pain. No enlarged lymph nodes.   Patient denies fatigue, headache, visual changes, confusion, drenching night sweats, palpable lymph node swelling, mucositis, odynophagia, dysphagia, nausea vomiting, jaundice, chest pain, palpitation, shortness of breath, dyspnea on exertion, productive cough, gum bleeding, epistaxis, hematemesis, hemoptysis, abdominal pain, abdominal swelling, Murdock satiety, melena, hematochezia, hematuria, skin rash, spontaneous bleeding, joint swelling, joint pain, heat or cold intolerance, bowel bladder incontinence, back pain, focal motor weakness, paresthesia, depression, suicidal or homocidal ideation, feeling hopelessness.   MEDICAL HISTORY: Past Medical History  Diagnosis Date  . Tonsillar cancer     tonsillar ca   . Hypertension   . HIV DISEASE 12/01/2006  . SYPHILIS, [place], LATENT NOS 01/18/2007  . HYPOGONADISM 12/23/2009  . HYPERLIPIDEMIA, WITH LOW HDL 01/03/2008  . HYPERTENSION 12/01/2006    SURGICAL HISTORY: History reviewed. No pertinent past surgical history.  MEDICATIONS: Current Outpatient Prescriptions  Medication Sig Dispense Refill  .  HYDROcodone-acetaminophen (VICODIN) 5-500 MG per tablet Take 1 tablet by mouth every 6 (six) hours as needed.      . ALPRAZolam (XANAX) 0.5 MG tablet Take 3 tablets (1.5 mg total) by mouth every 6 (six) hours as needed for sleep. Take three tablets every six hours as needed for anxiety  360 tablet  2  . benazepril-hydrochlorthiazide (LOTENSIN HCT) 10-12.5 MG per tablet Take 1 tablet by mouth daily.  30 tablet  11  . efavirenz-emtrictabine-tenofovir (ATRIPLA) 600-200-300 MG per tablet Take 1 tablet by mouth at bedtime.  30 tablet  2  . oxandrolone (OXANDRIN) 10 MG tablet Take 1 tablet (10 mg total) by mouth 2 (two) times daily.  60 tablet  2  . pravastatin (PRAVACHOL) 40 MG tablet TAKE 1 TABLET BY MOUTH EVERY DAY  30 tablet  1  . temazepam (RESTORIL) 15 MG capsule Take 2 capsules (30 mg total) by mouth at bedtime as needed. Take 1-2 capsules at bedtime as needed for sleep  60 capsule  2    ALLERGIES:  is allergic to codeine; sulfamethoxazole w-trimethoprim; and sulfonamide derivatives.  REVIEW OF SYSTEMS:  The rest of the 14-point review of system was negative.   Filed Vitals:   05/02/12 0855  BP: 96/62  Pulse: 71  Temp: 97.1 F (36.2 C)   Wt Readings from Last 3 Encounters:  05/02/12 151 lb 11.2 oz (68.811 kg)  03/29/12 150 lb 1.6 oz (68.085 kg)  12/08/11 157 lb 1.6 oz (71.26 kg)   ECOG Performance status: 0  PHYSICAL EXAMINATION:   General:  well-nourished in no acute distress.  Eyes:  no scleral icterus.  ENT:  Exposed bone to bottom right jaw. Right side of face red with scar tissue noted. There were no oropharyngeal  lesions on my unaided exam.  Neck was without thyromegaly.  Skin of his neck was fibrotic from history of radiation.  Lymphatics:  Negative cervical, supraclavicular or axillary adenopathy.  Respiratory: lungs were clear bilaterally without wheezing or crackles.  Cardiovascular:  Regular rate and rhythm, S1/S2, without murmur, rub or gallop.  There was no pedal edema.   GI:  abdomen was soft, flat, nontender, nondistended, without organomegaly.  Muscoloskeletal:  no spinal tenderness of palpation of vertebral spine.  Skin exam was without echymosis, petichae.  Neuro exam was nonfocal.  Patient was able to get on and off exam table without assistance.  Gait was normal.  Patient was alerted and oriented.  Attention was good.   Language was appropriate.  Mood was normal without depression.  Speech was not pressured.  Thought content was not tangential.    LABORATORY/RADIOLOGY DATA:  Lab Results  Component Value Date   WBC 6.4 05/02/2012   HGB 11.5* 05/02/2012   HCT 33.2* 05/02/2012   PLT 329 05/02/2012   GLUCOSE 72 02/02/2012   CHOL 131 02/02/2012   TRIG 148 02/02/2012   HDL 30* 02/02/2012   LDLCALC 71 02/02/2012   ALT 18 02/02/2012   AST 19 02/02/2012   NA 138 02/02/2012   K 3.9 02/02/2012   CL 100 02/02/2012   CREATININE 1.50* 02/02/2012   BUN 16 02/02/2012   CO2 28 02/02/2012   TSH 3.081 10/31/2011   INR 1.08 07/07/2010   HGBA1C 4.9 01/03/2008   ASSESSMENT AND PLAN:   1. History of oropharyngeal squamous cell carcinoma.  I discussed with Christopher [name] that there is no evidence of recurrence of metastatic disease and today clinical history, physical exam, and laboratory tests.  He is not hypothryoid with hx of radiation treatment. TSH pending today. 2. History of HIV and AIDS.  He is on Atripla per Christopher. Maurice [name].   3. Mild leukopenia:  Resolved.  4. Mild anemia. Possibly due to Atripla. Monitor. 5. Xerostomia.  I discussed with him that this sometimes can be a chronic issue and I advised him to continue with Biotene and artificial saliva.   6. HTN:  BP stable. On Lotensin/HCTZ per PCP 7. Renal insufficiency:  Most likely due to history of HTN.  He had no history of HIV-related nephropathy.  His Cr today is pending. 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 Seen by Christopher Christopher [name] (telephone number [phone]). Christopher Christopher [name] would like a CT scan of the mandible  without contrast. I clarified this with his office and with our radiology department. I have requested a CT of the maxillofacial area without contrast and will route results to Christopher Christopher [name].  9. Follow up:  he has appointment to see me in 6 months.  I advised him to follow up with ENT and RadOnc as well who can perform flexible laryngoscopy.  As he is almost 2 years out, I recommended stopping routine surveillance CT and just follow him clinically. If he has concerning symptoms, we may obtain CT at that time.  The patient was seen and examined with Christopher Gaylyn Rong. The length of time of the face-to-face encounter was 30 minutes. More than 50% of time was spent counseling and coordination of care.

## 2012-05-03 ENCOUNTER — Telehealth: Payer: Self-pay | Admitting: *Deleted

## 2012-05-03 ENCOUNTER — Telehealth: Payer: Self-pay | Admitting: Oncology

## 2012-05-03 ENCOUNTER — Ambulatory Visit (HOSPITAL_COMMUNITY)
Admission: RE | Admit: 2012-05-03 | Discharge: 2012-05-03 | Disposition: A | Discharge status: Home / Self Care | Payer: BC Managed Care – PPO | Source: Ambulatory Visit | Attending: Oncology | Admitting: Oncology

## 2012-05-03 DIAGNOSIS — R220 Localized swelling, mass and lump, head: Secondary | ICD-10-CM | POA: Insufficient documentation

## 2012-05-03 DIAGNOSIS — C099 Malignant neoplasm of tonsil, unspecified: Secondary | ICD-10-CM | POA: Insufficient documentation

## 2012-05-03 DIAGNOSIS — Z923 Personal history of irradiation: Secondary | ICD-10-CM | POA: Insufficient documentation

## 2012-05-03 DIAGNOSIS — Z9221 Personal history of antineoplastic chemotherapy: Secondary | ICD-10-CM | POA: Insufficient documentation

## 2012-05-03 DIAGNOSIS — C109 Malignant neoplasm of oropharynx, unspecified: Secondary | ICD-10-CM

## 2012-05-03 DIAGNOSIS — Z21 Asymptomatic human immunodeficiency virus [HIV] infection status: Secondary | ICD-10-CM | POA: Insufficient documentation

## 2012-05-03 DIAGNOSIS — M278 Other specified diseases of jaws: Secondary | ICD-10-CM

## 2012-05-03 NOTE — Telephone Encounter (Signed)
Added lb appts for 8/8 and 10/3. Lab 83mo and 62mo per 3rd pof from 6/12. Pt already on schedule for ct that was completed today (6/13). lmonvm for pt re lab appts. Also confirmed 12/12 appts and mailed schedule today.

## 2012-05-03 NOTE — Telephone Encounter (Signed)
Pt called back and confirmed appts 8/8 and 10/3

## 2012-05-03 NOTE — Telephone Encounter (Signed)
Christopher [name] 05/03/2012 2:59 PM Signed  Added lb appts for 8/8 and 10/3. Lab 2mo and 4mo per 3rd pof from 6/12. Pt already on schedule for ct that was completed today (6/13). lmonvm for pt re lab appts. Also confirmed 12/12 appts and mailed schedule today.    

## 2012-05-03 NOTE — Telephone Encounter (Signed)
Christopher [name] 05/03/2012 2:59 PM Signed  Added lb appts for 8/8 and 10/3. Lab 330mo and 30mo per 3rd pof from 6/12. Pt already on schedule for ct that was completed today (6/13). lmonvm for pt re lab appts. Also confirmed 12/12 appts and mailed schedule today.

## 2012-05-03 NOTE — Telephone Encounter (Addendum)
Spoke with patient and advised of labs and recommendations. Advised that CT scan was sent to Dr Frederik Pear office.  Message copied by Myrtis Ser on Thu May 03, 2012  2:54 PM ------      Message from: HA, Raliegh Ip T      Created: Wed May 02, 2012  4:50 PM       Please call pt.        1.  He is dehydrated.  Please make sure that he drinks at least 60oz of fluid daily.      2.  He is hypothyroid.  I've placed ordered electronically for Synthroid, and POF for lab in 2 and 4 months.  Please let him know that it's quite common to have hypothyroid with radiation with HNSCC.            Thanks.

## 2012-05-03 NOTE — Telephone Encounter (Signed)
He called to ask Dr. Maurice March if he should be alarmed at his kidney labs. States they were not able to use dye when they did the CT  BUN is 39 & Cr is 2.11. He asked that Dr. Maurice March speak with him about this

## 2012-05-03 NOTE — Telephone Encounter (Signed)
I gave this note to him

## 2012-05-03 NOTE — Telephone Encounter (Signed)
THIS REPORT WAS CALLED AND FAXED. GIVEN TO KRISTIN CURCIO,NP.

## 2012-05-04 ENCOUNTER — Other Ambulatory Visit: Payer: Self-pay | Admitting: Licensed Clinical Social Worker

## 2012-05-04 ENCOUNTER — Telehealth: Payer: Self-pay | Admitting: *Deleted

## 2012-05-04 DIAGNOSIS — B20 Human immunodeficiency virus [HIV] disease: Secondary | ICD-10-CM

## 2012-05-04 MED ORDER — ATAZANAVIR SULFATE 300 MG PO CAPS: 300.0000 mg | ORAL_CAPSULE | Freq: Every day | ORAL | Status: DC

## 2012-05-04 MED ORDER — RITONAVIR 100 MG PO TABS: 100.0000 mg | ORAL_TABLET | Freq: Every day | ORAL | Status: DC

## 2012-05-04 NOTE — Telephone Encounter (Signed)
He states he spoke with Dr. Maurice March yesterday & he had told him to lubricate his jaw for the necrosis. He is currently using Biotene for reduced saliva in mouth. States it works [employer]. He whought perhaps mineral oil in the ear would drip down to the mouth & help. I told him not to use that.  I told him I will speak with Dr. Maurice March to get clarification on this.  He also said he was going on a vacation from Atripla. I told him I will get more info on that & call him back   [phone] or [phone]

## 2012-05-07 ENCOUNTER — Telehealth: Payer: Self-pay | Admitting: *Deleted

## 2012-05-07 NOTE — Telephone Encounter (Signed)
Pt called back. He did speak with Dr. Maurice March. He was changed to reyataz & norvir per pt. Also he is to use mineral oil in his mouth for lubrication per Dr. Maurice March. He will keep hi supcoming appt

## 2012-05-07 NOTE — Telephone Encounter (Signed)
I had given the message to md. He was going to address it after clinic & then he left before I could speak with him. Per J. Cockerham, CMA, the md had the pt make an appt to see the doctor 06/01/12  I called the pt & LM asking him to call me back

## 2012-05-08 ENCOUNTER — Telehealth: Payer: Self-pay | Admitting: *Deleted

## 2012-05-08 DIAGNOSIS — B20 Human immunodeficiency virus [HIV] disease: Secondary | ICD-10-CM

## 2012-05-08 MED ORDER — ATAZANAVIR SULFATE 300 MG PO CAPS: 300.0000 mg | ORAL_CAPSULE | Freq: Every day | ORAL | Status: DC

## 2012-05-08 NOTE — Telephone Encounter (Signed)
Form is done & needs md signature. Pt walked in asking if it is done. I explained that after it is signed ,we fax it & this will take a few days to weeks.  I offered a sample bottle  Lot [id number] & exp 01/2013. I reviewed directions with him. He had no further questions

## 2012-05-08 NOTE — Telephone Encounter (Signed)
His partner, [name], called to ask that we do a prior auth for the reyataz. I called [facility] [phone] & the pharmacist will send the form & info here. I will complete the form when rec'd  I called the insurance company and they will fax it here.

## 2012-05-15 ENCOUNTER — Telehealth: Payer: Self-pay | Admitting: *Deleted

## 2012-05-15 NOTE — Telephone Encounter (Signed)
States Dr. Katheren Shams removed a tooth & wants to send him to Dr. Mauri Pole at [facility]. He wants to know if Dr. Maurice March knows him & what his opinion of him is. I told him DrMaurice March will be here Thursday & we will ask him then . He was ok with this

## 2012-05-29 ENCOUNTER — Other Ambulatory Visit: Payer: Self-pay | Admitting: Licensed Clinical Social Worker

## 2012-05-29 DIAGNOSIS — F419 Anxiety disorder, unspecified: Secondary | ICD-10-CM

## 2012-05-29 MED ORDER — ALPRAZOLAM 0.5 MG PO TABS: 1.5000 mg | ORAL_TABLET | Freq: Four times a day (QID) | ORAL | Status: DC | PRN

## 2012-05-30 ENCOUNTER — Telehealth: Payer: Self-pay | Admitting: *Deleted

## 2012-05-30 NOTE — Telephone Encounter (Signed)
Received call from Surgeon at [facility] requesting history on pt to specifically include staging and Radiation treatment doses and field. Surgery planned for Osteonecrosis.  Faxed first and last office visit note from Dr. Gaylyn Rong and XRT planning notes and  XRT end of treament notes to fax # [phone].

## 2012-05-31 ENCOUNTER — Other Ambulatory Visit: Payer: BC Managed Care – PPO

## 2012-05-31 ENCOUNTER — Encounter: Payer: Self-pay | Admitting: *Deleted

## 2012-05-31 ENCOUNTER — Other Ambulatory Visit: Payer: Self-pay | Admitting: Infectious Diseases

## 2012-05-31 DIAGNOSIS — B20 Human immunodeficiency virus [HIV] disease: Secondary | ICD-10-CM

## 2012-05-31 LAB — CBC WITH DIFFERENTIAL/PLATELET
Basophils Relative: 0 % (ref 0–1)
Eosinophils Relative: 2 % (ref 0–5)
HCT: 33.5 % — ABNORMAL LOW (ref 39.0–52.0)
Hemoglobin: 11.5 g/dL — ABNORMAL LOW (ref 13.0–17.0)
Lymphocytes Relative: 14 % (ref 12–46)
MCHC: 34.3 g/dL (ref 30.0–36.0)
MCV: 88.4 fL (ref 78.0–100.0)
Monocytes Absolute: 0.4 10*3/uL (ref 0.1–1.0)
Monocytes Relative: 7 % (ref 3–12)
Neutro Abs: 4.8 10*3/uL (ref 1.7–7.7)
RDW: 13 % (ref 11.5–15.5)

## 2012-05-31 LAB — COMPREHENSIVE METABOLIC PANEL
Albumin: 3.8 g/dL (ref 3.5–5.2)
BUN: 19 mg/dL (ref 6–23)
Calcium: 9.8 mg/dL (ref 8.4–10.5)
Chloride: 100 mEq/L (ref 96–112)
Creat: 1.23 mg/dL (ref 0.50–1.35)
Glucose, Bld: 155 mg/dL — ABNORMAL HIGH (ref 70–99)
Potassium: 5 mEq/L (ref 3.5–5.3)

## 2012-05-31 NOTE — Progress Notes (Signed)
Patient ID: Christopher [name], male   DOB: [birth year], 61 y.o.   MRN: [id number] Pt here to ask about medications prescribed by [facility].  Rxes were for Keflex and metronidazole.  RN checked these through [facility] and there were no contraindications to taking these medications with his current medications.  Pt also requested that lab work be drawn today so that the results could be sent to [facility] prior to further treatment.  Dr. Bonnetta Barry pt has been through [facility] diagnosis of oral cancer June 2011.  Pt has lost a considerable amount of weight, where he was once muscular, tanned and worked in the garden and around the house regularly.  He stated that he was not suicidal but just "wants to go to sleep."  He is continuing to take his HIV medications.  When asked whether he has discussed this with his partner he shared that he "just couldn't."  When offered the opportunity to speak with the [facility] counselor he was interested in setting up an appointment ASAP.  Pt has an appointment w/ Dr. Maurice March tomorrow.  Pt was given an appointment with Franne Forts, [facility] Counselor for Monday, July 15th at 1100.

## 2012-06-01 ENCOUNTER — Encounter: Payer: Self-pay | Admitting: Infectious Diseases

## 2012-06-01 ENCOUNTER — Telehealth: Payer: Self-pay | Admitting: *Deleted

## 2012-06-01 ENCOUNTER — Ambulatory Visit (INDEPENDENT_AMBULATORY_CARE_PROVIDER_SITE_OTHER): Payer: BC Managed Care – PPO | Admitting: Infectious Diseases

## 2012-06-01 VITALS — BP 136/86 | HR 85 | Ht 70.0 in | Wt 144.0 lb

## 2012-06-01 DIAGNOSIS — M8708 Idiopathic aseptic necrosis of bone, other site: Secondary | ICD-10-CM

## 2012-06-01 DIAGNOSIS — B20 Human immunodeficiency virus [HIV] disease: Secondary | ICD-10-CM

## 2012-06-01 DIAGNOSIS — M272 Inflammatory conditions of jaws: Secondary | ICD-10-CM

## 2012-06-01 LAB — T-HELPER CELL (CD4) - (RCID CLINIC ONLY): CD4 T Cell Abs: 200 uL — ABNORMAL LOW (ref 400–2700)

## 2012-06-01 MED ORDER — HYDROCODONE-ACETAMINOPHEN 5-500 MG PO TABS: 1.0000 | ORAL_TABLET | Freq: Four times a day (QID) | ORAL | Status: DC | PRN

## 2012-06-01 MED ORDER — RALTEGRAVIR POTASSIUM 400 MG PO TABS: 400.0000 mg | ORAL_TABLET | Freq: Two times a day (BID) | ORAL | Status: DC

## 2012-06-01 MED ORDER — MORPHINE SULFATE ER 30 MG PO TBCR: 30.0000 mg | EXTENDED_RELEASE_TABLET | Freq: Two times a day (BID) | ORAL | Status: DC

## 2012-06-01 NOTE — Telephone Encounter (Signed)
Office notes and labs faxed to Marquette Old at [facility] oral surgeon [phone]. Wendall Mola CMA

## 2012-06-01 NOTE — Progress Notes (Signed)
Patient ID: Christopher [name], male   DOB: Jan 15, 1951, 61 y.o.   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 He had lower molar extracted last month and developed abscess that has spontaneously drained externally. He has chronic jaw pain and limited mobiltiy. He has lost 20 lbs and looks "skeletal". He is taking Vicodan 5/325 up to six or more times/day and continues with jaw pain. Further complications have been developemnt of kidney injury with creatinines in 2mg  range. Wanted to change from Atripla to Reyataz/Norvir plus Abacavir but he didn't fill abacavir because of past history of sulfa allergy. Repeat viral load and CD4 was drawn here on 05/31/12 but results not available this AM. Will add raltegravir BID to present ARV regimen and likely third ARV on next visit in a month. [name] is tearful and distressed but not actively suicidal. Frustrated and fearful is his current mood and completley understandable. He has been on oral cephlexin and metronidazole for his jaw osteomylelitis prescribed by his oral surgeon at [facility].Dr. Gaylyn Rong of [facility] found elevated TSH and he is on of T4 replacement.  Exam: BP 136/86  Pulse 85  Ht 5\' 10"  (1.778 m)  Wt 144 lb (65.318 kg)  BMI 20.66 kg/m2 Cachetic and in pain. Sinus tract over left cheek draining purulent material. Jaw can only be partially opened secondary to pain. No thrush evident. No adenopathy of neck spaces. Chest clear and no murmurs. Impression HIV controled with suppressed HIV and CD4 counts >500  But suspect that because of recent complications from his mandibular osteo myelitis and necrosis that he may no longer be suppressed. Pending viral load and CD4 should tell us soon.  Osteonecrosis and myelitis of mandible Plan  Will increase pain control with MS Contin 30mg  BID with Vicodin for rescue           Will begin blending food at home to  increase nutritional intake since pain and trismus limit chewing           Add raltegravir BID to Reyataz and ritonavir. Next visit will consider third ARV.           Will seen in four weeks. Comment: Mr. Schmale clearly has life threatening complications from his mandibular osteonecrosis and bone infection with severe weight loss the problem. He is not interested in aggressive IV nutrition although he may need to consider this in near future. He also may need hospice referral if he doesn't improve in next two months. Discussed in detail with patient and will communicate this note with his multiple caretakers. Lina Sayre

## 2012-06-02 LAB — HIV-1 RNA QUANT-NO REFLEX-BLD: HIV-1 RNA Quant, Log: <1.3 {Log} (ref <1.30)

## 2012-06-04 ENCOUNTER — Ambulatory Visit: Payer: BC Managed Care – PPO

## 2012-06-04 DIAGNOSIS — F331 Major depressive disorder, recurrent, moderate: Secondary | ICD-10-CM

## 2012-06-05 NOTE — Progress Notes (Signed)
Christopher [name], a.k.a. "[name]", is a 61 year old Caucasian male who lives with his partner of 10 years and has had some prior therapy/counseling several years ago.  He has been diagnosed with HIV for around 30 years and has had an extensive string of life-threatening illnesses, including throat cancer (2 years ago) and most recently a rather severe tooth infection, requiring an extraction that is slowly showing signs of improvement, but has left him with a closed jaw, forcing him to be on a liquid diet and causing some speech difficulties.  This is all extremely frustrating to him for good reason.  He reports that he and his partner met at the gym and they would challenge each other with intense workouts.  He prided himself on his physical appearance, staying in tip top shape, but now has lost a great deal of weight and physical strength.  He reports constant pain.  He reports passive suicidal ideation, but no intent or plan.  He admits to social isolation, but says his partner is a tremendous support to him in many ways.  He describes their relationship as "ideal".  He did express some frustration that his partner has always pushed him to strive for more (beginning when they met at the gym) and this sometimes becomes tiresome.  He reports good sleep.  Appetite is hard to assess right now, given his eating situation and current limitations to a liquid diet.  Diagnosis: Axis I - 3 09.0 Adjustment Disorder with depressed mood;  Axis II - V71.9 no diagnosis;  Axis III - HIV+; bone necrosis of mandible; s/p throat CA;  Axis IV - social; occupational; medical;  Axis V - GAF = 50 Plan to meet with him in one week.

## 2012-06-06 ENCOUNTER — Telehealth: Payer: Self-pay

## 2012-06-06 NOTE — Telephone Encounter (Signed)
Pt calling regarding [facility] refill. He has been without medication for many days and wants to know what is taking so long.  Annice Pih, CMA has contacted the insurance company yesterday and was waiting for a prior authorization form to complete. It never came yesterday but has come in this morning.  I will complete the form and fax it back to the company today and inform the patient.   Form completed, faxed to insurance company and message left on patient's voicemail.   Confirmation for fax received.  Form sent for scanning.   Laurell Josephs, RN

## 2012-06-07 ENCOUNTER — Ambulatory Visit: Payer: BC Managed Care – PPO | Admitting: Radiation Oncology

## 2012-06-13 ENCOUNTER — Ambulatory Visit (HOSPITAL_COMMUNITY)
Admission: RE | Admit: 2012-06-13 | Discharge: 2012-06-13 | Disposition: A | Discharge status: Home / Self Care | Payer: BC Managed Care – PPO | Source: Ambulatory Visit | Attending: General Surgery | Admitting: General Surgery

## 2012-06-13 ENCOUNTER — Other Ambulatory Visit (HOSPITAL_BASED_OUTPATIENT_CLINIC_OR_DEPARTMENT_OTHER): Payer: Self-pay | Admitting: General Surgery

## 2012-06-13 ENCOUNTER — Encounter (HOSPITAL_BASED_OUTPATIENT_CLINIC_OR_DEPARTMENT_OTHER): Payer: BC Managed Care – PPO | Attending: General Surgery

## 2012-06-13 DIAGNOSIS — E785 Hyperlipidemia, unspecified: Secondary | ICD-10-CM | POA: Insufficient documentation

## 2012-06-13 DIAGNOSIS — M278 Other specified diseases of jaws: Secondary | ICD-10-CM | POA: Insufficient documentation

## 2012-06-13 DIAGNOSIS — T148XXA Other injury of unspecified body region, initial encounter: Secondary | ICD-10-CM

## 2012-06-13 DIAGNOSIS — T07XXXA Unspecified multiple injuries, initial encounter: Secondary | ICD-10-CM | POA: Insufficient documentation

## 2012-06-13 DIAGNOSIS — I10 Essential (primary) hypertension: Secondary | ICD-10-CM | POA: Insufficient documentation

## 2012-06-13 DIAGNOSIS — Z21 Asymptomatic human immunodeficiency virus [HIV] infection status: Secondary | ICD-10-CM | POA: Insufficient documentation

## 2012-06-13 DIAGNOSIS — Y842 Radiological procedure and radiotherapy as the cause of abnormal reaction of the patient, or of later complication, without mention of misadventure at the time of the procedure: Secondary | ICD-10-CM | POA: Insufficient documentation

## 2012-06-13 DIAGNOSIS — Z79899 Other long term (current) drug therapy: Secondary | ICD-10-CM | POA: Insufficient documentation

## 2012-06-13 DIAGNOSIS — X58XXXA Exposure to other specified factors, initial encounter: Secondary | ICD-10-CM | POA: Insufficient documentation

## 2012-06-13 DIAGNOSIS — Z85819 Personal history of malignant neoplasm of unspecified site of lip, oral cavity, and pharynx: Secondary | ICD-10-CM | POA: Insufficient documentation

## 2012-06-13 NOTE — H&P (Signed)
NAME:  Christopher [name], Christopher [name] NO.:  [id number]  MEDICAL RECORD NO.:  [id number]  LOCATION:  XRAY                         FACILITY:  [facility]  PHYSICIAN:  Joanne Gavel, M.D.        DATE OF BIRTH:  05/11/1951  DATE OF ADMISSION:  06/13/2012 DATE OF DISCHARGE:                             HISTORY & PHYSICAL   CHIEF COMPLAINT:  Osteoradionecrosis of the jaw.  HISTORY OF PRESENT ILLNESS:  This 61 year old man was treated for cancer of the tonsil.  He developed osteoradionecrosis of the jaw.  He underwent 40 treatments of hyperbaric oxygen, which ended in May 2013. He underwent oral surgery and developed an infection, which drained externally near the angle of the mandible on the left side.  He returns now at the behest of his oral surgeon, who says he needs 20 more hyperbaric treatments.  PAST MEDICAL HISTORY:  Significant for tonsillar cancer, hypertension, HIV, Lues, hypogonadism, hyperlipidemia, and hypertension.  PAST SURGICAL HISTORY:  Tonsillectomy, radiation treatments with cis- platinum.  MEDICATIONS:  Alprazolam, linezolid, benazepril, Atripla, Oxandrin, protocol, and Restoril.  ALLERGIES:  SULFA AND CODEINE.  REVIEW OF SYSTEMS:  Essentially as above.  PHYSICAL EXAMINATION:  VITAL SIGNS: Temperature 97.6, pulse 80, respiratory 18, blood pressure 138/90. GENERAL:  Well developed, slender, in no distress. CHEST:  Clear. HEART:  Regular rhythm. HEENT: There is a small puncture wound at the angle of the mandible on the right side.  I cannot make any diagnosis on the basis of my physical examination of his oral cavity.  IMPRESSION:  Late effects of radiation with osteoradionecrosis, for hyperbaric oxygen treatment.     Joanne Gavel, M.D.     RA/MEDQ  D:  06/13/2012  T:  06/13/2012  Job:  [id number]

## 2012-06-13 NOTE — Progress Notes (Signed)
[facility]  NAME:  [name], [name] NO.:  [id number]  MEDICAL RECORD NO.:  [id number]      DATE OF BIRTH:  02/27/1951  PHYSICIAN:  Joanne Gavel, M.D.         VISIT DATE:  06/13/2012                                  OFFICE VISIT   Letter of Medical Necessity  To Whom It May Concern  Mr. [name] is a 61 year old male, treated with radiation treatments cisplatin for cancer of the tonsil.  He developed osteoradionecrosis of the jaw.  He underwent 40 HBO treatments.  He was operated on by his Transport planner.  This eventuated an infection, which spontaneously drained exteriorly.  His oral surgeon has sent him back to the clinic and recommends 20 further HBO treatments for his osteoradionecrosis contusion.     Joanne Gavel, M.D.     RA/MEDQ  D:  06/13/2012  T:  06/13/2012  Job:  [id number]

## 2012-06-21 ENCOUNTER — Other Ambulatory Visit: Payer: Self-pay | Admitting: Infectious Diseases

## 2012-06-21 ENCOUNTER — Other Ambulatory Visit: Payer: BC Managed Care – PPO

## 2012-06-21 ENCOUNTER — Encounter (HOSPITAL_BASED_OUTPATIENT_CLINIC_OR_DEPARTMENT_OTHER): Payer: BC Managed Care – PPO | Attending: General Surgery

## 2012-06-21 DIAGNOSIS — B20 Human immunodeficiency virus [HIV] disease: Secondary | ICD-10-CM

## 2012-06-21 DIAGNOSIS — Y842 Radiological procedure and radiotherapy as the cause of abnormal reaction of the patient, or of later complication, without mention of misadventure at the time of the procedure: Secondary | ICD-10-CM | POA: Insufficient documentation

## 2012-06-21 DIAGNOSIS — M278 Other specified diseases of jaws: Secondary | ICD-10-CM | POA: Insufficient documentation

## 2012-06-22 LAB — T-HELPER CELL (CD4) - (RCID CLINIC ONLY): CD4 % Helper T Cell: 23 % — ABNORMAL LOW (ref 33–55)

## 2012-06-25 LAB — HIV-1 RNA QUANT-NO REFLEX-BLD: HIV 1 RNA Quant: <20 copies/mL (ref <20)

## 2012-06-27 ENCOUNTER — Encounter: Payer: Self-pay | Admitting: *Deleted

## 2012-06-27 ENCOUNTER — Other Ambulatory Visit: Payer: Self-pay | Admitting: *Deleted

## 2012-06-27 ENCOUNTER — Encounter: Payer: Self-pay | Admitting: Oncology

## 2012-06-27 NOTE — Progress Notes (Signed)
Pt was in lobby this morning at 9:30 am for a "procedure."  He says Dr. Gaylyn Rong told him to come in this morning for a quick 10 minute procedure r/t his neck nodules.   Per Dr. Gaylyn Rong,  He did not order any procedures for pt today and Clenton Pare, NP also says she did not order any procedures.  Pt is scheduled for lab only tomorrow to check his TSH.Marland Kitchen   Pt states very upset,  Has to go to [facility] today and unable to stay to have labs today and cannot come back for labs tomorrow.  Instructed labs to check his Thyroid and can be done w/i next week at his convenience.   Sent POF to scheduling.

## 2012-06-27 NOTE — Progress Notes (Signed)
Pt presented to clinic in the morning thinking that he had some "procedure" with me.  He did not have appointment with me.  I contacted Dr. Michell Heinrich later in the day who informed me that patient was supposed to see her today, but she needed to reschedule.  She graciously agreed to have her staff contact patient soon to reschedule her visit.

## 2012-06-28 ENCOUNTER — Telehealth: Payer: Self-pay | Admitting: Oncology

## 2012-06-28 ENCOUNTER — Telehealth: Payer: Self-pay | Admitting: *Deleted

## 2012-06-28 ENCOUNTER — Other Ambulatory Visit: Payer: BC Managed Care – PPO | Admitting: Lab

## 2012-06-28 ENCOUNTER — Ambulatory Visit: Payer: BC Managed Care – PPO | Admitting: Radiation Oncology

## 2012-06-28 NOTE — Telephone Encounter (Signed)
patient resacheduled for 07-02-2012 at 9:00am patient confirmed over the phone

## 2012-06-28 NOTE — Telephone Encounter (Signed)
lmonvm for pt confirming appt for 8/12.

## 2012-07-02 ENCOUNTER — Other Ambulatory Visit (HOSPITAL_BASED_OUTPATIENT_CLINIC_OR_DEPARTMENT_OTHER): Payer: BC Managed Care – PPO | Admitting: Lab

## 2012-07-02 DIAGNOSIS — C109 Malignant neoplasm of oropharynx, unspecified: Secondary | ICD-10-CM

## 2012-07-02 DIAGNOSIS — M278 Other specified diseases of jaws: Secondary | ICD-10-CM

## 2012-07-02 LAB — CBC WITH DIFFERENTIAL/PLATELET
BASO%: 0.8 % (ref 0.0–2.0)
Basophils Absolute: 0 10*3/uL (ref 0.0–0.1)
Eosinophils Absolute: 0.1 10*3/uL (ref 0.0–0.5)
HCT: 42.8 % (ref 38.4–49.9)
HGB: 14.4 g/dL (ref 13.0–17.1)
LYMPH%: 31.6 % (ref 14.0–49.0)
MCHC: 33.7 g/dL (ref 32.0–36.0)
MONO#: 0.3 10*3/uL (ref 0.1–0.9)
NEUT#: 2.1 10*3/uL (ref 1.5–6.5)
NEUT%: 56 % (ref 39.0–75.0)
Platelets: 179 10*3/uL (ref 140–400)
WBC: 3.7 10*3/uL — ABNORMAL LOW (ref 4.0–10.3)
lymph#: 1.2 10*3/uL (ref 0.9–3.3)

## 2012-07-02 LAB — COMPREHENSIVE METABOLIC PANEL
ALT: 19 U/L (ref 0–53)
CO2: 28 mEq/L (ref 19–32)
Calcium: 10 mg/dL (ref 8.4–10.5)
Chloride: 102 mEq/L (ref 96–112)
Creatinine, Ser: 1.14 mg/dL (ref 0.50–1.35)
Glucose, Bld: 162 mg/dL — ABNORMAL HIGH (ref 70–99)
Total Bilirubin: 0.4 mg/dL (ref 0.3–1.2)

## 2012-07-02 LAB — TSH: TSH: 1.545 u[IU]/mL (ref 0.350–4.500)

## 2012-07-04 ENCOUNTER — Telehealth: Payer: Self-pay

## 2012-07-04 NOTE — Telephone Encounter (Signed)
Message copied by Kallie Locks on Wed Jul 04, 2012  4:42 PM ------      Message from: Clenton Pare R      Created: Mon Jul 02, 2012  1:12 PM       Please call patient and let him know that his TSH (thyroid) is normal. Continue current dose of Synthroid. Will recheck again in Sept as scheduled.            Thanks

## 2012-07-06 ENCOUNTER — Ambulatory Visit (INDEPENDENT_AMBULATORY_CARE_PROVIDER_SITE_OTHER): Payer: BC Managed Care – PPO | Admitting: Infectious Diseases

## 2012-07-06 ENCOUNTER — Encounter: Payer: Self-pay | Admitting: Infectious Diseases

## 2012-07-06 VITALS — BP 120/80 | HR 69 | Temp 97.3°F | Ht 70.0 in | Wt 146.0 lb

## 2012-07-06 DIAGNOSIS — B20 Human immunodeficiency virus [HIV] disease: Secondary | ICD-10-CM

## 2012-07-06 MED ORDER — EMTRICITABINE 200 MG PO CAPS: 200.0000 mg | ORAL_CAPSULE | Freq: Every day | ORAL | Status: AC

## 2012-07-06 MED ORDER — PNEUMOCOCCAL VAC POLYVALENT 25 MCG/0.5ML IJ INJ: 0.5000 mL | INJECTION | Freq: Once | INTRAMUSCULAR | Status: DC

## 2012-07-06 NOTE — Progress Notes (Signed)
Patient ID: [name], male   DOB: [birth year], 61 y.o.   MRN: [id number] HIV f/u Note    Mr. Bognar is much better compared to last visit when he had masseter space abscess on right ruptured and draining externally and with marked trismus. He had his antibiotics changed by Dr. Emelia Loron at [facility] and informed that his mandible was viable and not necrotic. He has stopped draining and able to eat with resolution of trismus. He looks significantly better and feels better. His current ARVs reviewed and for reasons of oversight he has only been on atazanavir, ritonavir, and raltegravir. Will add emtracitabine today. He continues with HIV suppression and CD4 count has rebounded to >300. He has had no fevers nor continued drainage in mouth.  BP 120/80  Pulse 69  Temp 97.3 F (36.3 C) (Oral)  Ht 5\' 10"  (1.778 m)  Wt 146 lb (66.225 kg)  BMI 20.95 kg/m2 ABscess and left masseter area abscess has resolved and healed. He can open his jaw nearly normally today. Still has temporal wasting   No cervical adenopathy Chest clear and no murmurs. Impression and plan HIV well controlled but will add emtracitabine to his other two active antivirals of atazanavir and raltegravir Resolving left masseter space abscess with external drainage no longer evident. He will complete oral antibiotic regimen that he cannot recall in another week. I was very worried about his clinical condition last visit but he has markedly improved but still cachectic. He is using calorie supplements Will see again in 2months. Lina Sayre

## 2012-07-06 NOTE — Patient Instructions (Signed)
Please return in 3 months 2 weeks prior for labs

## 2012-07-11 NOTE — Progress Notes (Signed)
[facility]  NAME:  VAISHNAV, DEMARTIN NO.:  [id number]  MEDICAL RECORD NO.:  [id number]      DATE OF BIRTH:  [birth year]  PHYSICIAN:  Ardath Sax, M.D.           VISIT DATE:                                  OFFICE VISIT   Mr. Issa is a 61 year old gentleman who had carcinoma of a right tonsil and it was treated with radiation.  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  He has been treated with hyperbaric oxygen and as receive 20 treatments from [place] at [facility].  It is at least 50% and we would like to have him continued for another 20 treatments and I feel we could get this healed if he had another 20 treatments.     Ardath Sax, M.D.     PP/MEDQ  D:  07/11/2012  T:  07/11/2012  Job:  [id number]

## 2012-07-18 ENCOUNTER — Encounter (HOSPITAL_BASED_OUTPATIENT_CLINIC_OR_DEPARTMENT_OTHER): Payer: BC Managed Care – PPO

## 2012-07-24 ENCOUNTER — Other Ambulatory Visit: Payer: Self-pay | Admitting: Licensed Clinical Social Worker

## 2012-07-24 ENCOUNTER — Encounter (HOSPITAL_BASED_OUTPATIENT_CLINIC_OR_DEPARTMENT_OTHER): Payer: BC Managed Care – PPO | Attending: General Surgery

## 2012-07-24 DIAGNOSIS — M272 Inflammatory conditions of jaws: Secondary | ICD-10-CM

## 2012-07-24 DIAGNOSIS — M278 Other specified diseases of jaws: Secondary | ICD-10-CM | POA: Insufficient documentation

## 2012-07-24 DIAGNOSIS — Y842 Radiological procedure and radiotherapy as the cause of abnormal reaction of the patient, or of later complication, without mention of misadventure at the time of the procedure: Secondary | ICD-10-CM | POA: Insufficient documentation

## 2012-07-24 DIAGNOSIS — L0201 Cutaneous abscess of face: Secondary | ICD-10-CM | POA: Insufficient documentation

## 2012-07-24 DIAGNOSIS — L03211 Cellulitis of face: Secondary | ICD-10-CM | POA: Insufficient documentation

## 2012-07-24 MED ORDER — MORPHINE SULFATE ER 30 MG PO TBCR: 30.0000 mg | EXTENDED_RELEASE_TABLET | Freq: Two times a day (BID) | ORAL | Status: DC

## 2012-07-25 NOTE — Progress Notes (Signed)
[facility]  NAME:  [name], [name] NO.:  [id number]  MEDICAL RECORD NO.:  [id number]      DATE OF BIRTH:  1950-12-09  PHYSICIAN:  Ardath Sax, M.D.           VISIT DATE:                                  OFFICE VISIT   Christ[name] is a 60 year old man who was been getting HBO both at [facility] and here, and he has been the patient of an oral surgeon because he has been treated for a cancer of the right tonsil and received radiation. He recently had a tooth removed and now really has a huge amount of cellulitis around the right maxillary and mandibular areas.  He is already on Cipro and Flagyl given to him by his oral surgeon, but today, had an abscess that we noted after he got out of HBO, and we simply did an I and D, and got a culture and put in a wick of iodoform, which will change tomorrow.     Ardath Sax, M.D.     PP/MEDQ  D:  07/25/2012  T:  07/25/2012  Job:  [id number]

## 2012-07-29 NOTE — Progress Notes (Signed)
Appointment was rescheduled.

## 2012-08-07 ENCOUNTER — Other Ambulatory Visit: Payer: Self-pay | Admitting: *Deleted

## 2012-08-07 DIAGNOSIS — F419 Anxiety disorder, unspecified: Secondary | ICD-10-CM

## 2012-08-07 MED ORDER — TEMAZEPAM 15 MG PO CAPS: 30.0000 mg | ORAL_CAPSULE | Freq: Every evening | ORAL | Status: DC | PRN

## 2012-08-08 ENCOUNTER — Ambulatory Visit (INDEPENDENT_AMBULATORY_CARE_PROVIDER_SITE_OTHER): Payer: BC Managed Care – PPO | Admitting: *Deleted

## 2012-08-08 DIAGNOSIS — Z23 Encounter for immunization: Secondary | ICD-10-CM

## 2012-08-21 ENCOUNTER — Encounter (HOSPITAL_BASED_OUTPATIENT_CLINIC_OR_DEPARTMENT_OTHER): Payer: BC Managed Care – PPO | Attending: General Surgery

## 2012-08-21 DIAGNOSIS — M272 Inflammatory conditions of jaws: Secondary | ICD-10-CM | POA: Insufficient documentation

## 2012-08-21 DIAGNOSIS — M278 Other specified diseases of jaws: Secondary | ICD-10-CM | POA: Insufficient documentation

## 2012-08-21 DIAGNOSIS — C099 Malignant neoplasm of tonsil, unspecified: Secondary | ICD-10-CM | POA: Insufficient documentation

## 2012-08-21 DIAGNOSIS — Y842 Radiological procedure and radiotherapy as the cause of abnormal reaction of the patient, or of later complication, without mention of misadventure at the time of the procedure: Secondary | ICD-10-CM | POA: Insufficient documentation

## 2012-08-23 ENCOUNTER — Other Ambulatory Visit: Payer: BC Managed Care – PPO | Admitting: Lab

## 2012-08-23 ENCOUNTER — Other Ambulatory Visit: Payer: Self-pay | Admitting: Oncology

## 2012-08-23 DIAGNOSIS — C109 Malignant neoplasm of oropharynx, unspecified: Secondary | ICD-10-CM

## 2012-08-23 DIAGNOSIS — E039 Hypothyroidism, unspecified: Secondary | ICD-10-CM

## 2012-08-23 LAB — TSH: TSH: 0.413 u[IU]/mL (ref 0.350–4.500)

## 2012-08-29 ENCOUNTER — Telehealth: Payer: Self-pay | Admitting: *Deleted

## 2012-08-29 ENCOUNTER — Ambulatory Visit (HOSPITAL_BASED_OUTPATIENT_CLINIC_OR_DEPARTMENT_OTHER): Payer: BC Managed Care – PPO

## 2012-08-29 ENCOUNTER — Other Ambulatory Visit: Payer: Self-pay | Admitting: Internal Medicine

## 2012-08-29 ENCOUNTER — Other Ambulatory Visit: Payer: Self-pay | Admitting: Infectious Diseases

## 2012-08-29 DIAGNOSIS — C109 Malignant neoplasm of oropharynx, unspecified: Secondary | ICD-10-CM

## 2012-08-29 DIAGNOSIS — C099 Malignant neoplasm of tonsil, unspecified: Secondary | ICD-10-CM

## 2012-08-29 LAB — T4, FREE: Free T4: 0.85 ng/dL (ref 0.80–1.80)

## 2012-08-29 NOTE — Telephone Encounter (Signed)
Sure, we can check his TSH again soon.  Please place POF and order per his request.  Thanks.

## 2012-08-29 NOTE — Telephone Encounter (Signed)
Pt called to report in general he has been feeling increase fatigue, "no energy", w/ sob on exertion,  Coughing, congestion with a lot of phlegm production in the mornings.   He is asking if fatigue could be caused by his Thyroid?    Informed pt his TSH was wnl in August,  But will ask Dr. Gaylyn Rong if it should be checked again prior to next appt in December?   Regarding his other symptoms,  I suggested he will probably need to contact Dr. Maurice March,  But I will ask Dr. Gaylyn Rong first and call him back.  Pt verbalized understanding.

## 2012-08-29 NOTE — Telephone Encounter (Signed)
Called pt and instructed to come in to check his TSH.  He will come in at 3 pm today.  He has appt on Tues w/ ID MD to check on his other symptoms.

## 2012-08-29 NOTE — Telephone Encounter (Signed)
Patient called c/o SOB, low energy, and nervousness.  I asked if he had a PCP and he said he spoke with Dr. Maurice March about this and was told he did not need one and to ask Dr. Maurice March what he should do as he was advised he did not need a PCP. I advised him that he needed to go to urgent care to be evaluated for SOB and if worsens to go to urgent care. Given appt with Dr. Daiva Eves for 09/04/12. Wendall Mola CMA

## 2012-08-30 ENCOUNTER — Telehealth: Payer: Self-pay | Admitting: *Deleted

## 2012-08-30 NOTE — Telephone Encounter (Signed)
Message copied by Wende Mott on Thu Aug 30, 2012 10:16 AM ------      Message from: HA, Raliegh Ip T      Created: Thu Aug 30, 2012  9:02 AM       Please call pt.  His lab showed that his current dose of Synthroid is working.  No change in his dose of Synthroid.  Please ask him to discuss with his PCP as to why he has fatigue.  Thanks.

## 2012-08-30 NOTE — Telephone Encounter (Signed)
Called pt to notify him of TSH normal and to keep taking same dose of Synthroid,  Explained his fatigue not r/t to his thyroid.  Keep appt w/ his I.D. MD,  Dr. Maurice March, on this Tuesday.  Pt states he actually feels like he is coming down w/ "something" like a "virus."   He denies any fevers, just feels run down and some congestion in the mornings.  Instructed pt to call Dr. Bonnetta Barry office before the weekend if symptoms worsen or he develops a fever.  Pt verbalized understanding.

## 2012-08-31 ENCOUNTER — Other Ambulatory Visit: Payer: Self-pay | Admitting: *Deleted

## 2012-08-31 ENCOUNTER — Other Ambulatory Visit: Payer: Self-pay | Admitting: Infectious Diseases

## 2012-09-04 ENCOUNTER — Ambulatory Visit: Payer: BC Managed Care – PPO | Admitting: Infectious Disease

## 2012-09-04 ENCOUNTER — Other Ambulatory Visit: Payer: Self-pay | Admitting: Infectious Diseases

## 2012-09-04 ENCOUNTER — Telehealth: Payer: Self-pay

## 2012-09-04 NOTE — Telephone Encounter (Signed)
Pt calling for Xanax refill.  [facility] called regarding last refill of  Xanax.  Last fill date: 08-05-12 #360  Authorization given for 360 tablets with 2 refills per Dr Lina Sayre.  Previous script printed on 08-31-12 with Dr Moshe Cipro name was destroyed.  Medication was phone to pharmacy on 09-04-12 by Laurell Josephs, RN   Laurell Josephs, RN

## 2012-09-10 ENCOUNTER — Other Ambulatory Visit: Payer: Self-pay | Admitting: *Deleted

## 2012-09-10 DIAGNOSIS — B20 Human immunodeficiency virus [HIV] disease: Secondary | ICD-10-CM

## 2012-09-10 MED ORDER — RALTEGRAVIR POTASSIUM 400 MG PO TABS: 400.0000 mg | ORAL_TABLET | Freq: Two times a day (BID) | ORAL | Status: DC

## 2012-09-17 ENCOUNTER — Telehealth: Payer: Self-pay | Admitting: Infectious Disease

## 2012-09-17 ENCOUNTER — Other Ambulatory Visit: Payer: Self-pay | Admitting: *Deleted

## 2012-09-17 NOTE — Telephone Encounter (Signed)
Received a fax from the Support Program.  Mr. [name] was denied assistance due to his [employer] covers it.  Mr. [name] called back.  The other medication he is on is Emtriva. Found co-pay card for the [employer]

## 2012-09-25 ENCOUNTER — Other Ambulatory Visit: Payer: Self-pay | Admitting: Infectious Diseases

## 2012-10-01 ENCOUNTER — Other Ambulatory Visit: Payer: Self-pay | Admitting: Infectious Diseases

## 2012-10-01 DIAGNOSIS — G4700 Insomnia, unspecified: Secondary | ICD-10-CM

## 2012-10-11 ENCOUNTER — Other Ambulatory Visit (INDEPENDENT_AMBULATORY_CARE_PROVIDER_SITE_OTHER): Payer: BC Managed Care – PPO

## 2012-10-11 DIAGNOSIS — B20 Human immunodeficiency virus [HIV] disease: Secondary | ICD-10-CM

## 2012-10-11 LAB — CBC WITH DIFFERENTIAL/PLATELET
Basophils Relative: 1 % (ref 0–1)
Eosinophils Absolute: 0.1 10*3/uL (ref 0.0–0.7)
Eosinophils Relative: 3 % (ref 0–5)
Hemoglobin: 14.5 g/dL (ref 13.0–17.0)
Lymphs Abs: 1.5 10*3/uL (ref 0.7–4.0)
MCH: 33.6 pg (ref 26.0–34.0)
MCHC: 35.3 g/dL (ref 30.0–36.0)
MCV: 95.4 fL (ref 78.0–100.0)
Monocytes Relative: 9 % (ref 3–12)
Platelets: 191 10*3/uL (ref 150–400)
RBC: 4.31 MIL/uL (ref 4.22–5.81)

## 2012-10-11 LAB — COMPREHENSIVE METABOLIC PANEL
Alkaline Phosphatase: 61 U/L (ref 39–117)
CO2: 30 mEq/L (ref 19–32)
Creat: 1.1 mg/dL (ref 0.50–1.35)
Glucose, Bld: 85 mg/dL (ref 70–99)
Total Bilirubin: 2.7 mg/dL — ABNORMAL HIGH (ref 0.3–1.2)

## 2012-10-12 LAB — T-HELPER CELL (CD4) - (RCID CLINIC ONLY)
CD4 % Helper T Cell: 22 % — ABNORMAL LOW (ref 33–55)
CD4 T Cell Abs: 320 uL — ABNORMAL LOW (ref 400–2700)

## 2012-10-13 LAB — HIV-1 RNA QUANT-NO REFLEX-BLD: HIV 1 RNA Quant: <20 copies/mL (ref <20)

## 2012-10-17 ENCOUNTER — Other Ambulatory Visit: Payer: Self-pay | Admitting: Infectious Diseases

## 2012-10-17 DIAGNOSIS — R52 Pain, unspecified: Secondary | ICD-10-CM

## 2012-10-23 ENCOUNTER — Other Ambulatory Visit: Payer: Self-pay | Admitting: Oncology

## 2012-10-26 ENCOUNTER — Encounter: Payer: Self-pay | Admitting: *Deleted

## 2012-10-26 ENCOUNTER — Ambulatory Visit (INDEPENDENT_AMBULATORY_CARE_PROVIDER_SITE_OTHER): Payer: BC Managed Care – PPO | Admitting: Infectious Diseases

## 2012-10-26 ENCOUNTER — Ambulatory Visit: Payer: BC Managed Care – PPO | Admitting: Infectious Diseases

## 2012-10-26 ENCOUNTER — Encounter: Payer: Self-pay | Admitting: Infectious Diseases

## 2012-10-26 VITALS — BP 117/74 | HR 59 | Temp 97.1°F | Wt 142.0 lb

## 2012-10-26 DIAGNOSIS — M792 Neuralgia and neuritis, unspecified: Secondary | ICD-10-CM

## 2012-10-26 DIAGNOSIS — IMO0002 Reserved for concepts with insufficient information to code with codable children: Secondary | ICD-10-CM

## 2012-10-26 DIAGNOSIS — B20 Human immunodeficiency virus [HIV] disease: Secondary | ICD-10-CM

## 2012-10-26 LAB — VITAMIN B12: Vitamin B-12: 420 pg/mL (ref 211–911)

## 2012-10-26 MED ORDER — PREGABALIN 50 MG PO CAPS: 50.0000 mg | ORAL_CAPSULE | Freq: Three times a day (TID) | ORAL | Status: DC

## 2012-10-26 NOTE — Progress Notes (Signed)
Patient ID: [name], male   DOB: [birth year], 61 y.o.   MRN: [id number] HIV f/u visit     [name] continues to improve from standpoint of his mandibular osteomyelitis and has had no further external drainage and his ENT physicians are pleased that the bone is beginning to heal. Their pkan is to continue oral antibiotics (metronidazole and a quinolone for a total of 10 more months). [name] makes very little to nil saliva and has difficulty with normal feeding. He remains thin and malnourished but is very slowly improving. His current ARV regimen is complicated and tenofovir has to be avoided because of renal impairement. Will test for HLA sensitivity gene and if absent will consoltidate his ARVs to doltutegravir and abacavir and emtracitabine for simplified regimen. His HIV is suppressed on current regimen and CD4 is now 220 and reflects his improvement in with his osteo. His neuropathy symptoms are much worse over past 3 months and could be partly from metroniazole. Will check B12 and folate, latter especially with his poor nutrition.  Exam BP 117/74  Pulse 59  Temp 97.1 F (36.2 C) (Oral)  Wt 142 lb (64.411 kg)  Thin but abscess over right mandible has healed. Plan 1HIV continue current regimen and if HLA screen it negative will switch to doltutegravir, avbacvir and emtricitabine.  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 It is worse and will begin Lyrica 25 mg TID and check B12 and serum folate. If it worsens then will have to consider d/c of metronidazole. F/u 2 months [name]

## 2012-10-26 NOTE — Progress Notes (Signed)
Per Dr Maurice March it the patient HLA B*[id number] test is negative we will change him to Tivicay and Abacavir.

## 2012-10-31 ENCOUNTER — Other Ambulatory Visit: Payer: Self-pay | Admitting: Oncology

## 2012-11-01 ENCOUNTER — Encounter: Payer: Self-pay | Admitting: Oncology

## 2012-11-01 ENCOUNTER — Other Ambulatory Visit: Payer: BC Managed Care – PPO | Admitting: Lab

## 2012-11-01 ENCOUNTER — Ambulatory Visit (HOSPITAL_BASED_OUTPATIENT_CLINIC_OR_DEPARTMENT_OTHER): Payer: BC Managed Care – PPO | Admitting: Oncology

## 2012-11-01 ENCOUNTER — Telehealth: Payer: Self-pay | Admitting: Oncology

## 2012-11-01 VITALS — BP 104/68 | HR 58 | Temp 97.0°F | Resp 18 | Ht 70.0 in | Wt 145.6 lb

## 2012-11-01 DIAGNOSIS — N289 Disorder of kidney and ureter, unspecified: Secondary | ICD-10-CM

## 2012-11-01 DIAGNOSIS — E039 Hypothyroidism, unspecified: Secondary | ICD-10-CM | POA: Insufficient documentation

## 2012-11-01 DIAGNOSIS — B977 Papillomavirus as the cause of diseases classified elsewhere: Secondary | ICD-10-CM

## 2012-11-01 DIAGNOSIS — C099 Malignant neoplasm of tonsil, unspecified: Secondary | ICD-10-CM

## 2012-11-01 DIAGNOSIS — C109 Malignant neoplasm of oropharynx, unspecified: Secondary | ICD-10-CM

## 2012-11-01 DIAGNOSIS — B20 Human immunodeficiency virus [HIV] disease: Secondary | ICD-10-CM

## 2012-11-01 NOTE — Telephone Encounter (Signed)
appts made and printed    anne

## 2012-11-01 NOTE — Progress Notes (Signed)
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  PAST THERAPY:  definitive concurrent chemotherapy cisplatin 100mg /m2 q3wks and daily XRT between June 09, 2010, and July 21, 2010.  CURRENT THERAPY:  watchful observation.  INTERVAL HISTORY: [name] 61 y.o. male returns for regular follow up by himself.  Not eating well due to ONJ. States he continues to have pain to the jaw. Followed by an oral surgeon at [name]. May require surgery.  Now only able to eat liquids and cannot open mouth very wide. Weight down by 6 lbs. Taking in 5 cans of Ensure per day in addition to soft foods. He is working part-time without fatigue. He has no dysphagia, odynophagia, nausea vomiting, chest pain, focal motor weakness, bone pain. No enlarged lymph nodes.   Patient denies fatigue, headache, visual changes, confusion, drenching night sweats, palpable lymph node swelling, mucositis, odynophagia, dysphagia, nausea vomiting, jaundice, chest pain, palpitation, shortness of breath, dyspnea on exertion, productive cough, gum bleeding, epistaxis, hematemesis, hemoptysis, abdominal pain, abdominal swelling, Huffaker satiety, melena, hematochezia, hematuria, skin rash, spontaneous bleeding, joint swelling, joint pain, heat or cold intolerance, bowel bladder incontinence, back pain, focal motor weakness, paresthesia, depression, suicidal or homocidal ideation, feeling hopelessness.   MEDICAL HISTORY: Past Medical History  Diagnosis Date  . Tonsillar cancer     tonsillar ca   . Hypertension   . HIV DISEASE 12/01/2006  . SYPHILIS, [place], LATENT NOS 01/18/2007  . HYPOGONADISM 12/23/2009  . HYPERLIPIDEMIA, WITH LOW HDL 01/03/2008  . HYPERTENSION 12/01/2006    SURGICAL HISTORY: History reviewed. No pertinent past surgical history.  MEDICATIONS: Current Outpatient Prescriptions  Medication Sig Dispense Refill  . ALPRAZolam  (XANAX) 0.5 MG tablet TAKE 3 TABLETS BY MOUTH EVERY 6 HOURS AS NEEDED FOR ANXIETY\  360 tablet  2  . atazanavir (REYATAZ) 300 MG capsule Take 1 capsule (300 mg total) by mouth daily with breakfast.  30 capsule  0  . benazepril-hydrochlorthiazide (LOTENSIN HCT) 10-12.5 MG per tablet TAKE 1 TABLET BY MOUTH EVERY DAY  30 tablet  4  . cephALEXin (KEFLEX) 500 MG capsule Take 500 mg by mouth 4 (four) times daily.      . chlorhexidine (PERIDEX) 0.12 % solution Use as directed 15 mLs in the mouth or throat 2 (two) times daily.      Marland Kitchen emtricitabine (EMTRIVA) 200 MG capsule Take 200 mg by mouth daily.      Marland Kitchen HYDROcodone-acetaminophen (VICODIN) 5-500 MG per tablet TAKE 1 TABLET BY MOUTH EVERY 6 HOURS AS NEEDED FOR PAIN  30 tablet  4  . levothyroxine (SYNTHROID, LEVOTHROID) 50 MCG tablet TAKE 1 TABLET (50 MCG TOTAL) BY MOUTH DAILY.  30 tablet  5  . metroNIDAZOLE (FLAGYL) 500 MG tablet Take 500 mg by mouth every 6 (six) hours.      . pravastatin (PRAVACHOL) 40 MG tablet TAKE 1 TABLET BY MOUTH EVERY DAY  30 tablet  3  . pregabalin (LYRICA) 50 MG capsule Take 1 capsule (50 mg total) by mouth 3 (three) times daily.  90 capsule  11  . raltegravir (ISENTRESS) 400 MG tablet Take 1 tablet (400 mg total) by mouth 2 (two) times daily.  60 tablet  11  . ritonavir (NORVIR) 100 MG TABS Take 1 tablet (100 mg total) by mouth daily.  30 tablet  6  . temazepam (RESTORIL) 15 MG capsule TAKE 2 CAPSULES BY MOUTH AT BEDTIME AS  NEEDED  60 capsule  1   Current Facility-Administered Medications  Medication Dose Route Frequency Provider Last Rate Last Dose  . pneumococcal 23 valent vaccine (PNU-IMMUNE) injection 0.5 mL  0.5 mL Intramuscular Once Christopher Sayre, MD        ALLERGIES:  is allergic to codeine; sulfamethoxazole w-trimethoprim; and sulfonamide derivatives.  REVIEW OF SYSTEMS:  The rest of the 14-point review of system was negative.   Filed Vitals:   11/01/12 1048  BP: 104/68  Pulse: 58  Temp: 97 F (36.1 C)  Resp:  18   Wt Readings from Last 3 Encounters:  11/01/12 145 lb 9.6 oz (66.044 kg)  10/26/12 142 lb (64.411 kg)  07/06/12 146 lb (66.225 kg)   ECOG Performance status: 0  PHYSICAL EXAMINATION:   General:  well-nourished in no acute distress.  Eyes:  no scleral icterus.  ENT:  Exposed bone to bottom right jaw. Right side of face red with scar tissue noted. There were no oropharyngeal lesions on my unaided exam.  Neck was without thyromegaly.  Skin of his neck was fibrotic from history of radiation.  Lymphatics:  Negative cervical, supraclavicular or axillary adenopathy.  Respiratory: lungs were clear bilaterally without wheezing or crackles.  Cardiovascular:  Regular rate and rhythm, S1/S2, without murmur, rub or gallop.  There was no pedal edema.  GI:  abdomen was soft, flat, nontender, nondistended, without organomegaly.  Muscoloskeletal:  no spinal tenderness of palpation of vertebral spine.  Skin exam was without echymosis, petichae.  Neuro exam was nonfocal.  Patient was able to get on and off exam table without assistance.  Gait was normal.  Patient was alerted and oriented.  Attention was good.   Language was appropriate.  Mood was normal without depression.  Speech was not pressured.  Thought content was not tangential.    LABORATORY/RADIOLOGY DATA:  Lab Results  Component Value Date   WBC 3.4* 10/11/2012   HGB 14.5 10/11/2012   HCT 41.1 10/11/2012   PLT 191 10/11/2012   GLUCOSE 85 10/11/2012   CHOL 131 02/02/2012   TRIG 148 02/02/2012   HDL 30* 02/02/2012   LDLCALC 71 02/02/2012   ALT 17 10/11/2012   AST 16 10/11/2012   NA 139 10/11/2012   K 4.1 10/11/2012   CL 102 10/11/2012   CREATININE 1.10 10/11/2012   BUN 20 10/11/2012   CO2 30 10/11/2012   TSH 0.914 08/29/2012   INR 1.08 07/07/2010   HGBA1C 4.9 01/03/2008   ASSESSMENT AND PLAN:   1. History of oropharyngeal squamous cell carcinoma.  I discussed with [name] that there is no evidence of recurrence of metastatic disease and  today clinical history, physical exam, and laboratory tests.   2. History of HIV and AIDS.  Managed by Dr. Maurice [name].   3. Mild leukopenia:  Stable. 4. Mild anemia. Resolved. 5. Xerostomia.  I discussed with him that this sometimes can be a chronic issue and I advised him to continue with Biotene and artificial saliva.   6. HTN:  BP stable. On Lotensin/HCTZ per PCP 7. Renal insufficiency:  Most likely due to history of HTN.  He had no history of HIV-related nephropathy.  His Cr has stabilized. 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 Followed by [name] and may need surgery. 9. Hypothyroidism: TSH is stable on Synthroid. 10. 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 Taking 5 cans of Ensure per day with weight loss. I have offered a dietician consult which he has declined. 11. Psychosocial:  Pain to jaw has really impacted his quality of life. He is very frustrated with his condition. I have offered a referral to CSW or chaplain which he has declined. 12. Follow up:  he has appointment in 6 months.  I advised him to follow up with ENT and RadOnc as well who can perform flexible laryngoscopy.  As he is over 2 years out, will not perform routine surveillance CT and just follow him clinically. If he has concerning symptoms, we may obtain CT at that time.  The length of time of the face-to-face encounter was 30 minutes. More than 50% of time was spent counseling and coordination of care.

## 2012-11-05 LAB — HLA B*5701: HLA-B*5701: NEGATIVE

## 2012-11-23 ENCOUNTER — Other Ambulatory Visit: Payer: Self-pay | Admitting: Infectious Diseases

## 2012-11-23 DIAGNOSIS — G4700 Insomnia, unspecified: Secondary | ICD-10-CM

## 2012-11-28 ENCOUNTER — Encounter (HOSPITAL_BASED_OUTPATIENT_CLINIC_OR_DEPARTMENT_OTHER): Payer: PRIVATE HEALTH INSURANCE | Attending: General Surgery

## 2012-11-28 DIAGNOSIS — Y842 Radiological procedure and radiotherapy as the cause of abnormal reaction of the patient, or of later complication, without mention of misadventure at the time of the procedure: Secondary | ICD-10-CM | POA: Insufficient documentation

## 2012-11-28 DIAGNOSIS — C099 Malignant neoplasm of tonsil, unspecified: Secondary | ICD-10-CM | POA: Insufficient documentation

## 2012-11-28 DIAGNOSIS — M278 Other specified diseases of jaws: Secondary | ICD-10-CM | POA: Insufficient documentation

## 2012-11-28 DIAGNOSIS — T66XXXS Radiation sickness, unspecified, sequela: Secondary | ICD-10-CM | POA: Insufficient documentation

## 2012-11-29 ENCOUNTER — Other Ambulatory Visit: Payer: Self-pay | Admitting: Infectious Diseases

## 2012-11-30 ENCOUNTER — Ambulatory Visit: Payer: BC Managed Care – PPO | Admitting: Infectious Diseases

## 2012-11-30 ENCOUNTER — Other Ambulatory Visit: Payer: Self-pay | Admitting: Licensed Clinical Social Worker

## 2012-11-30 ENCOUNTER — Other Ambulatory Visit: Payer: Self-pay | Admitting: Infectious Diseases

## 2012-11-30 DIAGNOSIS — F419 Anxiety disorder, unspecified: Secondary | ICD-10-CM

## 2012-11-30 MED ORDER — ALPRAZOLAM 0.5 MG PO TABS: 0.5000 mg | ORAL_TABLET | Freq: Three times a day (TID) | ORAL | Status: DC | PRN

## 2012-12-01 NOTE — Progress Notes (Signed)
[facility]  NAME:  [name], [name] NO.:  [id number]  MEDICAL RECORD NO.:  [id number]      DATE OF BIRTH:  Aug 15, 1951  PHYSICIAN:  Ardath Sax, M.D.      VISIT DATE:  11/30/2012                                  OFFICE VISIT   He is a 62 year old male who has a history of being treated for carcinoma of the tonsil.  This was treated by resection and later radiation for which he developed radiation osteonecrosis. Since that time he has been treated on 2 separate occasions in the hyperbaric oxygen therapy, and he has had a total of 40 treatments and it has helped him considerably.  As far as treating his osteoradionecrosis.  He developed some drainage and they determined that he had some osteomyelitis of the mandible, and they went in and resected it and had to replace that part of the mandible with hardware made of Tungsten.  He still has the stitches and as I am seeing him today and he has quite a bit of swelling.  The oral surgeon felt like he still needed some hyperbaric oxygen therapy as he has what they are saying as osteomyelitis of the right mandible.  This man otherwise is reasonably healthy.  He has a blood pressure of 140/90, pulse of 70, respirations 16, temperature 98.6.  He does have a history of being HIV positive, which is apparently under control.  The patient will be followed here in the Wound Clinic and we will make application for another treatment of hyperbaric oxygen as it helped him considerably in the past and at least got him to this point.  DIAGNOSIS:  At this point is, history of carcinoma of the right tonsil with treatment with surgery and radiation and later developing osteoradionecrosis and probably also has osteomyelitis involving the right mandible after his recent surgery.     Ardath Sax, M.D.     PP/MEDQ  D:  11/30/2012  T:  12/01/2012  Job:  [id number]

## 2012-12-03 ENCOUNTER — Telehealth: Payer: Self-pay | Admitting: *Deleted

## 2012-12-03 ENCOUNTER — Telehealth: Payer: Self-pay | Admitting: Infectious Disease

## 2012-12-03 MED ORDER — LEVOFLOXACIN 750 MG PO TABS: 750.0000 mg | ORAL_TABLET | Freq: Every day | ORAL | Status: DC

## 2012-12-03 NOTE — Telephone Encounter (Signed)
Pt had surgery at [facility] for ORN mandible. Micro from bone grew Pan Sensitive Pseudomonas and pt placed on oral levaquin 750mg  daily. I would like pt worked into clinic with myself this week or the next

## 2012-12-03 NOTE — Telephone Encounter (Signed)
Call from MD at [facility], patient is being treated for pseudomonas of his mandibular bone with Levaquin. MD requested to speak with Dr. Maurice March who is on vacation. Patient had a fracture of his mandibular bone and reconstructive surgery which got infected. Gave Dr. Zenaida Niece Dam's pager # [phone] she was requesting to speak with one of the ID MDs. Wendall Mola CMA

## 2012-12-04 NOTE — Telephone Encounter (Signed)
Patient is coming in on Wednesday 12/12/2012, I offered him something sooner but he has a ton of other appointments this week and could not make until then. He also had questions of why he is coming here when [facility] doctors are treating him. I let him know that that because he is our patient and we treat infections we just wanted to follow up with him to make sure he was improving.

## 2012-12-04 NOTE — Telephone Encounter (Signed)
That is perfect.  

## 2012-12-05 ENCOUNTER — Ambulatory Visit (INDEPENDENT_AMBULATORY_CARE_PROVIDER_SITE_OTHER): Payer: PRIVATE HEALTH INSURANCE | Admitting: Infectious Disease

## 2012-12-05 ENCOUNTER — Encounter: Payer: Self-pay | Admitting: Infectious Disease

## 2012-12-05 VITALS — BP 110/76 | HR 89 | Temp 98.6°F | Wt 147.0 lb

## 2012-12-05 DIAGNOSIS — A498 Other bacterial infections of unspecified site: Secondary | ICD-10-CM

## 2012-12-05 DIAGNOSIS — N289 Disorder of kidney and ureter, unspecified: Secondary | ICD-10-CM

## 2012-12-05 DIAGNOSIS — B965 Pseudomonas (aeruginosa) (mallei) (pseudomallei) as the cause of diseases classified elsewhere: Secondary | ICD-10-CM

## 2012-12-05 DIAGNOSIS — B20 Human immunodeficiency virus [HIV] disease: Secondary | ICD-10-CM

## 2012-12-05 DIAGNOSIS — M879 Osteonecrosis, unspecified: Secondary | ICD-10-CM

## 2012-12-05 DIAGNOSIS — M8708 Idiopathic aseptic necrosis of bone, other site: Secondary | ICD-10-CM

## 2012-12-05 MED ORDER — DOLUTEGRAVIR SODIUM 50 MG PO TABS: 50.0000 mg | ORAL_TABLET | Freq: Every day | ORAL | Status: DC

## 2012-12-05 MED ORDER — ABACAVIR SULFATE-LAMIVUDINE 600-300 MG PO TABS: 1.0000 | ORAL_TABLET | Freq: Every day | ORAL | Status: DC

## 2012-12-05 MED ORDER — LEVOFLOXACIN 750 MG PO TABS: 750.0000 mg | ORAL_TABLET | Freq: Every day | ORAL | Status: DC

## 2012-12-05 NOTE — Progress Notes (Signed)
Subjective:    Patient ID: Christopher [name], male    DOB: 1950/12/18, 62 y.o.   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 Christopher [name] was being seen in [facility] where he has had reconstructive surgery. I received a phone call in Monday from the ENT surgeons stating that the patient had grown Pseudomonas aeruginosa from bone culture that was sensitive to ciprofloxacin and levofloxacin at the patient had been started on high-dose 7 and 50 mg of levofloxacin. I therefore arranged for followup today and the patient was seen today in clinic by myself. He seems to be doing relatively well in recovering from his surgery is slight at [facility]  ENT surgeon back at [facility] this Friday.  I informed Christopher [name] that like to treat him for least 4 weeks if not 6 weeks with oral levofloxacin to treat for osteomyelitis. We'll check a sedimentation rate rate and C-reactive protein today.  We also reviewed his HIV which has been very well controlled and perfect we undetectable viral load and a healthy CD4 count. His current antiretroviral regimen which is interested been, Reyataz boosted with Norvir and Isentress was constructed when he developed worsening renal insufficiency. Dr. Maurice March had planned to change him to a block of year limited he been and tivicay pending the results of his HLA b701 test which : Been found to be negative. Therefore will change him to this regimen today.  I spent greater than 45 minutes with the patient including greater than 50% of time in face to face counsel of the patient and in coordination of their care.    Review of Systems  Constitutional: Negative for fever, chills, diaphoresis, activity change, appetite change, fatigue and unexpected weight change.  HENT: Negative for congestion, sore throat, rhinorrhea, sneezing, trouble  swallowing and sinus pressure.   Eyes: Negative for photophobia and visual disturbance.  Respiratory: Negative for cough, chest tightness, shortness of breath, wheezing and stridor.   Cardiovascular: Negative for chest pain, palpitations and leg swelling.  Gastrointestinal: Negative for nausea, vomiting, abdominal pain, diarrhea, constipation, blood in stool, abdominal distention and anal bleeding.  Genitourinary: Negative for dysuria, hematuria, flank pain and difficulty urinating.  Musculoskeletal: Negative for myalgias, back pain, joint swelling, arthralgias and gait problem.  Skin: Positive for wound. Negative for color change, pallor and rash.  Neurological: Negative for dizziness, tremors, weakness and light-headedness.  Hematological: Negative for adenopathy. Does not bruise/bleed easily.  Psychiatric/Behavioral: Negative for behavioral problems, confusion, sleep disturbance, dysphoric mood, decreased concentration and agitation.       Objective:   Physical Exam  Constitutional: He is oriented to person, place, and time. He appears well-developed and well-nourished. No distress.  HENT:  Head: Normocephalic and atraumatic.    Mouth/Throat: Oropharynx is clear and moist.  Eyes: Conjunctivae normal and EOM are normal.  Neck: Normal range of motion. Neck supple.  Cardiovascular: Normal rate, regular rhythm and normal heart sounds.   Pulmonary/Chest: Effort normal. No respiratory distress.  Abdominal: He exhibits no distension.  Musculoskeletal: He exhibits no edema and no tenderness.  Neurological: He is alert and oriented to person, place, and time. He has normal reflexes. Coordination normal.  Skin: Skin is warm and dry. He is not diaphoretic.  Psychiatric: He has a normal mood and affect. His behavior is normal. Judgment and thought content  normal.          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

## 2012-12-10 ENCOUNTER — Other Ambulatory Visit: Payer: Self-pay | Admitting: *Deleted

## 2012-12-10 ENCOUNTER — Telehealth: Payer: Self-pay | Admitting: *Deleted

## 2012-12-10 DIAGNOSIS — R52 Pain, unspecified: Secondary | ICD-10-CM

## 2012-12-10 MED ORDER — HYDROCODONE-ACETAMINOPHEN 5-325 MG PO TABS: 1.0000 | ORAL_TABLET | Freq: Four times a day (QID) | ORAL | Status: DC | PRN

## 2012-12-10 NOTE — Telephone Encounter (Signed)
Patient called requesting a different strength APAP vicodin, stating that the pharmacy told him he would no longer be able to receive 5-500.  New script called into the pharmacy for 5-325.  Patient notified. Andree Coss, RN

## 2012-12-10 NOTE — Telephone Encounter (Signed)
[facility] called and requested to change the patient Vicodin 5-500 mg to Norco 5-325 as they can no longer get the 5-500 mg. Spoke with Dr Maurice March last week about this and he advised the change is fine for 5-325 mg q6 hours #30 with 4 refills. Advised the pharmacy to D/C the old 5-500 rx and replace it with this and they advised ok. We verified the patient information and ended the call.

## 2012-12-12 ENCOUNTER — Ambulatory Visit: Payer: PRIVATE HEALTH INSURANCE | Admitting: Infectious Disease

## 2012-12-20 ENCOUNTER — Telehealth: Payer: Self-pay | Admitting: *Deleted

## 2012-12-20 NOTE — Telephone Encounter (Signed)
Patient came into clinic c/o neuropathy in hands and feet. Dr. Maurice March gave him Lyrica to try and it hasn't helped much. Because Dr. Maurice March in on vacation will send note to Dr. Daiva Eves who he saw recently. Patient has not tried neurontin. Christopher [name]

## 2012-12-20 NOTE — Telephone Encounter (Signed)
I doubt that the gabapentin will help much either. DOesnt he have a narcotic to treat this pain? He had vicodin for his osteonecrosis. Has he tried taking extra vicodin?

## 2012-12-20 NOTE — Telephone Encounter (Signed)
It is not so much pain as numbness Christopher [name]

## 2012-12-20 NOTE — Telephone Encounter (Signed)
It is more numbness than pain

## 2012-12-21 ENCOUNTER — Other Ambulatory Visit: Payer: Self-pay | Admitting: Infectious Diseases

## 2012-12-21 NOTE — Telephone Encounter (Signed)
01/16/13, will have him discuss with Dr. Maurice March then

## 2012-12-21 NOTE — Telephone Encounter (Signed)
When is his next visit? I doubt we can make a huge difference in the numbness even with gabapentin

## 2012-12-21 NOTE — Telephone Encounter (Signed)
Sounds reasonable.

## 2012-12-23 ENCOUNTER — Other Ambulatory Visit: Payer: Self-pay | Admitting: Infectious Diseases

## 2012-12-24 ENCOUNTER — Other Ambulatory Visit: Payer: Self-pay | Admitting: Licensed Clinical Social Worker

## 2012-12-24 ENCOUNTER — Encounter (HOSPITAL_BASED_OUTPATIENT_CLINIC_OR_DEPARTMENT_OTHER): Payer: PRIVATE HEALTH INSURANCE | Attending: General Surgery

## 2012-12-24 DIAGNOSIS — M278 Other specified diseases of jaws: Secondary | ICD-10-CM | POA: Insufficient documentation

## 2012-12-24 DIAGNOSIS — G4700 Insomnia, unspecified: Secondary | ICD-10-CM

## 2012-12-24 DIAGNOSIS — Y842 Radiological procedure and radiotherapy as the cause of abnormal reaction of the patient, or of later complication, without mention of misadventure at the time of the procedure: Secondary | ICD-10-CM | POA: Insufficient documentation

## 2012-12-24 MED ORDER — TEMAZEPAM 15 MG PO CAPS: 15.0000 mg | ORAL_CAPSULE | Freq: Every evening | ORAL | Status: DC | PRN

## 2012-12-25 ENCOUNTER — Telehealth: Payer: Self-pay | Admitting: *Deleted

## 2012-12-25 NOTE — Telephone Encounter (Signed)
Pain medication not working for the numbness in his hands.  He has difficulty holding anything due to not being able to feel it.  Lyrica and Neurontin not working on the numbness either.  Pt requested earlier appt than with Dr. Maurice March to discuss this problem.  Appt given w/ Dr. Daiva Eves for 01/08/13.

## 2013-01-01 ENCOUNTER — Other Ambulatory Visit: Payer: Self-pay | Admitting: Oncology

## 2013-01-01 ENCOUNTER — Telehealth: Payer: Self-pay | Admitting: *Deleted

## 2013-01-01 DIAGNOSIS — G629 Polyneuropathy, unspecified: Secondary | ICD-10-CM

## 2013-01-01 DIAGNOSIS — E039 Hypothyroidism, unspecified: Secondary | ICD-10-CM

## 2013-01-01 NOTE — Progress Notes (Signed)
POF to schedulers, per Dr. Gaylyn Rong, for follow-up within 2 weeks; voice message left notifying patient.

## 2013-01-01 NOTE — Telephone Encounter (Signed)
Called pt to FU on vm. Pt states he has multiple issues he is discussing w/all of his drs. He states his "hands and feet are completely numb, the skin on his fingers is slick making it difficult to hold utensils, his temp has been between 92-94 degrees  > 1 month".  Pt undergoing hyperbaric tx s/p "jaw replacement" 3 weeks ago in [place]. He states he has "titanium bolts" in his jaw. Pt's 3rd hyperbaric tx is today; he will have 40 tx. Pt also on antibiotics, post-op.  Pt states Dr Gaylyn Rong is treating him for thyroid issues; he continues tx for HIV.  Pt states he would like to know if the radiation he received will possibly cause further neuropathy/problems. Informed Christopher [name] will relay his questions/concerns to Dr Michell Heinrich, and he will get response today.

## 2013-01-02 ENCOUNTER — Telehealth: Payer: Self-pay | Admitting: *Deleted

## 2013-01-02 ENCOUNTER — Encounter (HOSPITAL_BASED_OUTPATIENT_CLINIC_OR_DEPARTMENT_OTHER): Payer: PRIVATE HEALTH INSURANCE

## 2013-01-02 ENCOUNTER — Telehealth: Payer: Self-pay | Admitting: Oncology

## 2013-01-02 NOTE — Telephone Encounter (Signed)
S/w the pt and he is aware of his appts on 01/18/2013

## 2013-01-02 NOTE — Telephone Encounter (Signed)
Pt called, confirmed his appt here for lab/Dr. Gaylyn Rong on 12/18/12

## 2013-01-03 ENCOUNTER — Other Ambulatory Visit: Payer: Self-pay

## 2013-01-04 ENCOUNTER — Other Ambulatory Visit: Payer: BC Managed Care – PPO

## 2013-01-07 ENCOUNTER — Other Ambulatory Visit: Payer: Self-pay | Admitting: Infectious Diseases

## 2013-01-07 ENCOUNTER — Other Ambulatory Visit: Payer: PRIVATE HEALTH INSURANCE

## 2013-01-07 DIAGNOSIS — E039 Hypothyroidism, unspecified: Secondary | ICD-10-CM

## 2013-01-07 DIAGNOSIS — B20 Human immunodeficiency virus [HIV] disease: Secondary | ICD-10-CM

## 2013-01-07 LAB — CBC WITH DIFFERENTIAL/PLATELET
Eosinophils Absolute: 0.2 10*3/uL (ref 0.0–0.7)
Eosinophils Relative: 5 % (ref 0–5)
HCT: 40.2 % (ref 39.0–52.0)
Lymphocytes Relative: 33 % (ref 12–46)
Lymphs Abs: 1.1 10*3/uL (ref 0.7–4.0)
MCH: 33.3 pg (ref 26.0–34.0)
MCV: 94.8 fL (ref 78.0–100.0)
Monocytes Absolute: 0.4 10*3/uL (ref 0.1–1.0)
Platelets: 137 10*3/uL — ABNORMAL LOW (ref 150–400)
RBC: 4.24 MIL/uL (ref 4.22–5.81)
RDW: 14.8 % (ref 11.5–15.5)
WBC: 3.3 10*3/uL — ABNORMAL LOW (ref 4.0–10.5)

## 2013-01-07 LAB — TSH: TSH: 2.037 u[IU]/mL (ref 0.350–4.500)

## 2013-01-07 LAB — COMPLETE METABOLIC PANEL WITH GFR
BUN: 15 mg/dL (ref 6–23)
CO2: 32 mEq/L (ref 19–32)
Calcium: 10.1 mg/dL (ref 8.4–10.5)
Chloride: 106 mEq/L (ref 96–112)
Creat: 1.31 mg/dL (ref 0.50–1.35)
GFR, Est African American: 67 mL/min
GFR, Est Non African American: 58 mL/min — ABNORMAL LOW
Total Bilirubin: 0.5 mg/dL (ref 0.3–1.2)

## 2013-01-08 ENCOUNTER — Telehealth: Payer: Self-pay | Admitting: *Deleted

## 2013-01-08 ENCOUNTER — Ambulatory Visit: Payer: PRIVATE HEALTH INSURANCE | Admitting: Infectious Disease

## 2013-01-08 LAB — T-HELPER CELL (CD4) - (RCID CLINIC ONLY)
CD4 % Helper T Cell: 24 % — ABNORMAL LOW (ref 33–55)
CD4 T Cell Abs: 260 uL — ABNORMAL LOW (ref 400–2700)

## 2013-01-08 NOTE — Telephone Encounter (Signed)
Pt left VM, states he had labs yesterday by Dr. Maurice March (CBC, CMET, TSH).  Does he still need to have labs done here on his office visit 2/28?

## 2013-01-08 NOTE — Telephone Encounter (Signed)
No need to repeat lab.  Thanks.

## 2013-01-08 NOTE — Telephone Encounter (Signed)
Called pt and left VM we will cancel lab appt,,  Keep appt w/ dr. Gaylyn Rong at 2pm on 01/18/13 as scheduled.

## 2013-01-10 ENCOUNTER — Ambulatory Visit: Payer: PRIVATE HEALTH INSURANCE | Admitting: Radiation Oncology

## 2013-01-11 ENCOUNTER — Ambulatory Visit: Payer: PRIVATE HEALTH INSURANCE | Admitting: Radiation Oncology

## 2013-01-16 ENCOUNTER — Encounter: Payer: Self-pay | Admitting: Infectious Diseases

## 2013-01-16 ENCOUNTER — Ambulatory Visit (INDEPENDENT_AMBULATORY_CARE_PROVIDER_SITE_OTHER): Payer: PRIVATE HEALTH INSURANCE | Admitting: Infectious Diseases

## 2013-01-16 VITALS — BP 159/92 | HR 75 | Temp 97.6°F | Wt 156.0 lb

## 2013-01-16 DIAGNOSIS — F419 Anxiety disorder, unspecified: Secondary | ICD-10-CM

## 2013-01-16 DIAGNOSIS — G4700 Insomnia, unspecified: Secondary | ICD-10-CM

## 2013-01-16 DIAGNOSIS — F411 Generalized anxiety disorder: Secondary | ICD-10-CM

## 2013-01-16 DIAGNOSIS — B20 Human immunodeficiency virus [HIV] disease: Secondary | ICD-10-CM

## 2013-01-16 DIAGNOSIS — R630 Anorexia: Secondary | ICD-10-CM

## 2013-01-16 MED ORDER — DRONABINOL 5 MG PO CAPS: 5.0000 mg | ORAL_CAPSULE | Freq: Two times a day (BID) | ORAL | Status: DC

## 2013-01-16 MED ORDER — ALPRAZOLAM 0.5 MG PO TABS: 0.5000 mg | ORAL_TABLET | Freq: Three times a day (TID) | ORAL | Status: DC | PRN

## 2013-01-16 MED ORDER — TEMAZEPAM 15 MG PO CAPS: 15.0000 mg | ORAL_CAPSULE | Freq: Every evening | ORAL | Status: DC | PRN

## 2013-01-16 NOTE — Progress Notes (Signed)
Patient ID: NIAL HAWE, male   DOB: [birth year], 62 y.o.   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 He had restorative surgery at [facility] with mandibular fixation for a pathologic fracture and has done remarkably well. Intraoperative cultures grew pseudomonas which was susceptible to levofloxacin which he is currently on for another months. His jaw pain has been resolving and he has no drainage or abscess signs or symptoms. He is thin and cachectic but his weight has been stable. He has no taste or smell intact and uses mainly oral supplements. He wants to see if Marinol will improve his appetite and will begin 5mg  po BID before meals. 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 He does not abuse these drugs and only refills in timely manner. I do not see advantage to attempt to take him off these drugs.     He currently is HIV suppressed on dolutegravir and Epzicom. He wants to prepare for end of life and if tumor recurs will not go through other chemotherapy or surgery. He will fill out a DNR for our and his own personal records that will spell out his wishes.    BP 159/92  Pulse 75  Temp(Src) 97.6 F (36.4 C) (Oral)  Wt 156 lb (70.761 kg)  BMI 22.38 kg/m2  No drainage from right mandible and non tender to palpation. No adenopathy.  Problems and assesment HIV stable and suppressed. Will continue present ARV regimen Mandibular osteo and osteonecrosis s/p radiotherapy that is now under control End of life planning and with his partner, Christopher [name], who is also seen here. Will add Marinol 5mg  BID to current meds to improve his appetite. Lina Sayre

## 2013-01-16 NOTE — Patient Instructions (Addendum)
Continue to take medications as directed and follow up at clinic in 3 months.

## 2013-01-17 ENCOUNTER — Other Ambulatory Visit: Payer: Self-pay | Admitting: Infectious Diseases

## 2013-01-18 ENCOUNTER — Other Ambulatory Visit: Payer: PRIVATE HEALTH INSURANCE | Admitting: Lab

## 2013-01-18 ENCOUNTER — Telehealth: Payer: Self-pay | Admitting: Oncology

## 2013-01-18 ENCOUNTER — Ambulatory Visit (HOSPITAL_BASED_OUTPATIENT_CLINIC_OR_DEPARTMENT_OTHER): Payer: PRIVATE HEALTH INSURANCE | Admitting: Oncology

## 2013-01-18 ENCOUNTER — Ambulatory Visit: Payer: BC Managed Care – PPO | Admitting: Infectious Diseases

## 2013-01-18 VITALS — BP 154/97 | HR 80 | Temp 97.3°F | Resp 20 | Ht 70.0 in | Wt 155.4 lb

## 2013-01-18 MED ORDER — PILOCARPINE HCL 5 MG PO TABS: 5.0000 mg | ORAL_TABLET | Freq: Three times a day (TID) | ORAL | Status: DC

## 2013-01-18 MED ORDER — PREGABALIN 50 MG PO CAPS: 50.0000 mg | ORAL_CAPSULE | Freq: Two times a day (BID) | ORAL | Status: DC

## 2013-01-18 NOTE — Progress Notes (Signed)
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  PAST THERAPY:  definitive concurrent chemotherapy cisplatin 100mg /m2 q3wks and daily XRT between June 09, 2010, and July 21, 2010.  CURRENT THERAPY:  watchful observation.   INTERVAL HISTORY: Christopher [name] 62 y.o. male returns for regular follow up by himself.  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  He has problem holding on to utensils, writing.  This came on about a month ago.  He was given Lyrica 50mg  PO once daily.  He took it for a month but discontinued it since there was no improvement of his symptoms.  He has improvement of right jaw osteonecrosis.  He is undergoing hyperbaric oxygen at [facility].  He no longer needs any pain med.  He still has severe xerostomia.  He needs to drink water frequently.  He has dysphagia with bread and dry meat such as chicken.  He needs to drink water to get these down.  He has normal appetite; however, he tries to watch his weight.  He does have some tinnitus and mild weight loss.  These are stable; and he has not needed hearing aids.  He denied fever, mucositis, nausea/vomiting, jaundice, palpable nodes, neck mass, SOB, chest pain, abdominal pain, bleeding symptoms, edema, lower back pain, bowel/bladder incontinence.  The rest of the 14-point review of system was negative.    MEDICAL HISTORY: Past Medical History  Diagnosis Date  . Tonsillar cancer     tonsillar ca   . Hypertension   . HIV DISEASE 12/01/2006  . SYPHILIS, [place], LATENT NOS 01/18/2007  . HYPOGONADISM 12/23/2009  . HYPERLIPIDEMIA, WITH LOW HDL 01/03/2008  . HYPERTENSION 12/01/2006    SURGICAL HISTORY: No past surgical history on file.  MEDICATIONS: Current Outpatient Prescriptions  Medication Sig Dispense Refill  . abacavir-lamiVUDine (EPZICOM) 600-300 MG per tablet Take 1 tablet by mouth daily.  30 tablet  11   . ALPRAZolam (XANAX) 0.5 MG tablet Take 3 tablets (1.5 mg total) by mouth every 8 (eight) hours as needed for anxiety.  360 tablet  3  . chlorhexidine (PERIDEX) 0.12 % solution Use as directed 15 mLs in the mouth or throat 2 (two) times daily.      Roselie Awkward Sodium (TIVICAY) 50 MG TABS Take 1 tablet (50 mg total) by mouth daily.  30 tablet  11  . levofloxacin (LEVAQUIN) 750 MG tablet Take 1 tablet (750 mg total) by mouth daily.  30 tablet  2  . levothyroxine (SYNTHROID, LEVOTHROID) 50 MCG tablet TAKE 1 TABLET (50 MCG TOTAL) BY MOUTH DAILY.  30 tablet  5  . temazepam (RESTORIL) 15 MG capsule Take 1 capsule (15 mg total) by mouth at bedtime as needed for sleep.  60 capsule  3  . dronabinol (MARINOL) 5 MG capsule Take 1 capsule (5 mg total) by mouth 2 (two) times daily before a meal.  60 capsule  3  . pilocarpine (SALAGEN) 5 MG tablet Take 1 tablet (5 mg total) by mouth 3 (three) times daily.  90 tablet  3  . pregabalin (LYRICA) 50 MG capsule Take 1 capsule (50 mg total) by mouth 2 (two) times daily.  60 capsule  3  . [DISCONTINUED] Dolutegravir Sodium (TIVICAY) 50 MG TABS Take 1 tablet (50 mg total) by mouth daily.  30 tablet  11   No current facility-administered medications for this visit.  ALLERGIES:  is allergic to codeine; sulfamethoxazole w-trimethoprim; and sulfonamide derivatives.  REVIEW OF SYSTEMS:  The rest of the 14-point review of system was negative.   Filed Vitals:   01/18/13 1402  BP: 154/97  Pulse: 80  Temp: 97.3 F (36.3 C)  Resp: 20   Wt Readings from Last 3 Encounters:  01/18/13 155 lb 6.4 oz (70.489 kg)  01/16/13 156 lb (70.761 kg)  12/05/12 147 lb (66.679 kg)   ECOG Performance status: 0-1  PHYSICAL EXAMINATION:   General:  Thin-appearing man, in no acute distress.  Eyes:  no scleral icterus.  ENT:  There were no oropharyngeal lesions on my unaided exam.  I did not see any exposed mandibles or maxillary.  Neck was without thyromegaly.  Skin of his neck  was slightly fibrotic from history of radiation.  Lymphatics:  Negative cervical, supraclavicular or axillary adenopathy. There was no lymphedema.   Respiratory: lungs were clear bilaterally without wheezing or crackles.  Cardiovascular:  Regular rate and rhythm, S1/S2, without murmur, rub or gallop.  There was no pedal edema.  GI:  abdomen was soft, flat, nontender, nondistended, without organomegaly.  Muscoloskeletal:  no spinal tenderness of palpation of vertebral spine.  Skin exam was without echymosis, petichae.  Neuro exam was nonfocal.  Patient was able to get on and off exam table without assistance.  Gait was normal.  Patient was alert and oriented.  Attention was good.   Language was appropriate.  Mood was normal without depression.  Speech was not pressured.  Thought content was not tangential.    LABORATORY/RADIOLOGY DATA:  Lab Results  Component Value Date   WBC 3.3* 01/07/2013   HGB 14.1 01/07/2013   HCT 40.2 01/07/2013   PLT 137* 01/07/2013   GLUCOSE 78 01/07/2013   CHOL 131 02/02/2012   TRIG 148 02/02/2012   HDL 30* 02/02/2012   LDLCALC 71 02/02/2012   ALT 16 01/07/2013   AST 14 01/07/2013   NA 146* 01/07/2013   K 3.5 01/07/2013   CL 106 01/07/2013   CREATININE 1.31 01/07/2013   BUN 15 01/07/2013   CO2 32 01/07/2013   TSH 2.037 01/07/2013   INR 1.08 07/07/2010   HGBA1C 4.9 01/03/2008    ASSESSMENT AND PLAN:   1. History of oropharyngeal squamous cell carcinoma.  Continue to be in remission. As he is more than 2 years out from the finish of therapy, without focal symptoms, there is no indication for routine imaging. He reported that he's not smoking, chewing tobacco or drinking EtOH.  2. History of HIV and AIDS.  He is on Abacavir/Lamividine, Dolutegravir per Dr. Maurice March.   3. Mild leukopenia:  Resolved.  4. Xerostomia.  Despite Biotene, he still has severe xerostomia, I added on Salagen prn.  5. HTN; off of med now.  6. 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  I recommended that he resumes Lyrica at higer dose at 50mg  PO BID x 1 month.  If he still has symptoms and no major side effect, I advised him to contact us to increase it it 50mg  PO TID.  7. Renal insufficiency:  Most likely due to history of HTN.  He had no history of HIV-related nephropathy.  His Cr today is at baseline.  8. Follow up:  he has appointment to see [place] in 6 months.  I advised him to follow up with ENT and RadOnc as well. I advised him to continue hyperbaric O2 treatment at [facility] until instructed  to stop by oral surgery.  9. Primary care:  He is looking for a new PCP as Dr. Maurice March is retiring.  I gave him a few recommendations.     The length of time of the face-to-face encounter was 15 minutes. More than 50% of time was spent counseling and coordination of care.

## 2013-01-18 NOTE — Telephone Encounter (Signed)
sw pt and advised on 8.16.14 appt....advised pt that the June appt has been cancelled per Dr. Gaylyn Rong request.

## 2013-01-21 ENCOUNTER — Telehealth: Payer: Self-pay | Admitting: *Deleted

## 2013-01-21 ENCOUNTER — Encounter (HOSPITAL_BASED_OUTPATIENT_CLINIC_OR_DEPARTMENT_OTHER): Payer: PRIVATE HEALTH INSURANCE | Attending: General Surgery

## 2013-01-21 DIAGNOSIS — Y842 Radiological procedure and radiotherapy as the cause of abnormal reaction of the patient, or of later complication, without mention of misadventure at the time of the procedure: Secondary | ICD-10-CM | POA: Insufficient documentation

## 2013-01-21 DIAGNOSIS — M278 Other specified diseases of jaws: Secondary | ICD-10-CM | POA: Insufficient documentation

## 2013-01-21 NOTE — Telephone Encounter (Signed)
Pt called to notify Dr. Gaylyn Rong that he has actually been taking Lyrica 50 mg TID per Dr. Maurice March.  Pt told us he was only taking it once a day and did not realize his partner has been putting in his med box TID.  Instructed pt to continue lyrica TID as prescribed by Dr. Maurice March.  He verbalized understanding.

## 2013-01-30 ENCOUNTER — Telehealth: Payer: Self-pay | Admitting: *Deleted

## 2013-01-30 NOTE — Telephone Encounter (Addendum)
Left vm w/call back name, number requesting call back to set up FU appt w/Dr Michell Heinrich, if pt desires-per Dr Michell Heinrich. 3:10 pm Informed Jacolyn Reedy, med secretary that if pt calls today, schedule FU w/Dr Michell Heinrich if pt desires.

## 2013-02-01 NOTE — Telephone Encounter (Signed)
Called pt and left vm requesting he call back and let this office know if he would like FU w/Dr Michell Heinrich. Left call back name of Darryl Nestle and phone #.

## 2013-02-06 ENCOUNTER — Telehealth: Payer: Self-pay | Admitting: *Deleted

## 2013-02-06 ENCOUNTER — Telehealth: Payer: Self-pay | Admitting: Dietician

## 2013-02-06 NOTE — Telephone Encounter (Signed)
Per Dr Maurice March made the patient an appt with [facility] for a PCP. The appt is made for April 8 at 215 pm. Called patient and gave him the information for this visit and the phone number just in case [phone]

## 2013-02-06 NOTE — Telephone Encounter (Signed)
Pt called to ask which PCP Dr. Gaylyn Rong had recommended to him on office visit. He could not remember the names.  Informed pt Dr. Gaylyn Rong does not make referral for PCPs, but he did mention Dr. Sanda Linger or Dr. Darryll Capers, both with [facility].  Informed pt to choose any PCP he wishes to see or call one of the above, emphasizing it is important for pt to have PCP.  He verbalized understanding.

## 2013-02-12 ENCOUNTER — Telehealth: Payer: Self-pay | Admitting: *Deleted

## 2013-02-12 NOTE — Telephone Encounter (Signed)
Patient's Rx for Temazepam was changed by Dr. Daiva Eves when Dr. Maurice March was on vacation. It know says one at night and he previously took 2 at night. He got a script for #60 and it was suppose to last for 2 months. He did not realize this and has still been taking 2 at night, so he has run out. He would like a new script with the previous directions. Will speak with Dr. Maurice March on Friday 02/15/13 and call patient with the decision. Wendall Mola

## 2013-02-15 ENCOUNTER — Other Ambulatory Visit: Payer: Self-pay | Admitting: *Deleted

## 2013-02-15 DIAGNOSIS — G4700 Insomnia, unspecified: Secondary | ICD-10-CM

## 2013-02-15 MED ORDER — TEMAZEPAM 15 MG PO CAPS: 30.0000 mg | ORAL_CAPSULE | Freq: Every evening | ORAL | Status: DC | PRN

## 2013-02-15 NOTE — Telephone Encounter (Signed)
Per Dr. Maurice March ok to change Rx to 2 tablets at night. [facility] and patient notified. Wendall Mola

## 2013-03-19 ENCOUNTER — Other Ambulatory Visit: Payer: Self-pay | Admitting: Licensed Clinical Social Worker

## 2013-03-19 DIAGNOSIS — G4700 Insomnia, unspecified: Secondary | ICD-10-CM

## 2013-03-19 DIAGNOSIS — F411 Generalized anxiety disorder: Secondary | ICD-10-CM

## 2013-03-19 MED ORDER — ALPRAZOLAM 0.5 MG PO TABS: 1.5000 mg | ORAL_TABLET | Freq: Three times a day (TID) | ORAL | Status: DC | PRN

## 2013-03-19 MED ORDER — TEMAZEPAM 15 MG PO CAPS: 30.0000 mg | ORAL_CAPSULE | Freq: Every evening | ORAL | Status: DC | PRN

## 2013-03-22 ENCOUNTER — Ambulatory Visit (INDEPENDENT_AMBULATORY_CARE_PROVIDER_SITE_OTHER): Payer: PRIVATE HEALTH INSURANCE | Admitting: Infectious Diseases

## 2013-03-22 ENCOUNTER — Encounter: Payer: Self-pay | Admitting: Infectious Diseases

## 2013-03-22 VITALS — BP 161/99 | HR 76 | Temp 97.6°F | Ht 70.0 in | Wt 168.0 lb

## 2013-03-22 DIAGNOSIS — F411 Generalized anxiety disorder: Secondary | ICD-10-CM

## 2013-03-22 MED ORDER — ALPRAZOLAM 0.5 MG PO TABS: 1.5000 mg | ORAL_TABLET | Freq: Two times a day (BID) | ORAL | Status: AC

## 2013-03-22 MED ORDER — CLONAZEPAM 1 MG PO TABS: 1.0000 mg | ORAL_TABLET | Freq: Two times a day (BID) | ORAL | Status: DC | PRN

## 2013-03-27 NOTE — Progress Notes (Signed)
HIV visit.me   Rahmir is seen today to try to resolve some of his medication issues. 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 Effective triple therapy saved his life and he walked out of hospice care but with gratefulness. When he was dying he was very anxious and was placed on high doses of alprazolam that he has adamantly refused to taper. Since I will be retiring this coming July, my partners undestandibly do not want continue this potentially dangerous practice. We have agreed to begin to taper his alprazolam to 3 tabs BID for a month and then once daily and begin Klonapin 2mg  BID for anxiety control. Mr. 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 He is seen at [facility] for this and has suprisingly come under control and he can now eat reasonably well but has little taste. His HIV is suppressed but his CD4 count is in 200-300 range and he is amazing survivor. I will see him next month and plan is for Dr. Orvan Falconer to see him in f/u for which both I and patient are very grateful. BP 161/99  Pulse 76  Temp(Src) 97.6 F (36.4 C) (Oral)  Ht 5\' 10"  (1.778 m)  Wt 168 lb (76.204 kg)  BMI 24.11 kg/m2 Jaw is nontender and without abscess.  Lina Sayre

## 2013-04-01 ENCOUNTER — Other Ambulatory Visit: Payer: PRIVATE HEALTH INSURANCE

## 2013-04-07 ENCOUNTER — Other Ambulatory Visit: Payer: Self-pay | Admitting: Oncology

## 2013-04-09 ENCOUNTER — Other Ambulatory Visit: Payer: Self-pay | Admitting: Infectious Diseases

## 2013-04-09 ENCOUNTER — Other Ambulatory Visit: Payer: Self-pay | Admitting: Oncology

## 2013-04-19 ENCOUNTER — Encounter: Payer: Self-pay | Admitting: Infectious Diseases

## 2013-04-19 ENCOUNTER — Ambulatory Visit (INDEPENDENT_AMBULATORY_CARE_PROVIDER_SITE_OTHER): Payer: PRIVATE HEALTH INSURANCE | Admitting: Infectious Diseases

## 2013-04-19 VITALS — BP 150/81 | HR 74 | Temp 98.1°F | Wt 162.0 lb

## 2013-04-19 DIAGNOSIS — B20 Human immunodeficiency virus [HIV] disease: Secondary | ICD-10-CM

## 2013-04-19 DIAGNOSIS — R52 Pain, unspecified: Secondary | ICD-10-CM

## 2013-04-19 LAB — CBC WITH DIFFERENTIAL/PLATELET
Basophils Relative: 1 % (ref 0–1)
Eosinophils Absolute: 0.2 10*3/uL (ref 0.0–0.7)
Eosinophils Relative: 4 % (ref 0–5)
Hemoglobin: 14.8 g/dL (ref 13.0–17.0)
Lymphs Abs: 1.4 10*3/uL (ref 0.7–4.0)
MCH: 33.7 pg (ref 26.0–34.0)
MCHC: 36.2 g/dL — ABNORMAL HIGH (ref 30.0–36.0)
MCV: 93.2 fL (ref 78.0–100.0)
Monocytes Relative: 9 % (ref 3–12)
Neutrophils Relative %: 51 % (ref 43–77)
Platelets: 179 10*3/uL (ref 150–400)
RBC: 4.39 MIL/uL (ref 4.22–5.81)

## 2013-04-19 LAB — COMPLETE METABOLIC PANEL WITH GFR
Alkaline Phosphatase: 60 U/L (ref 39–117)
BUN: 20 mg/dL (ref 6–23)
CO2: 30 mEq/L (ref 19–32)
Creat: 1.41 mg/dL — ABNORMAL HIGH (ref 0.50–1.35)
GFR, Est African American: 61 mL/min
GFR, Est Non African American: 53 mL/min — ABNORMAL LOW
Glucose, Bld: 100 mg/dL — ABNORMAL HIGH (ref 70–99)
Sodium: 142 mEq/L (ref 135–145)
Total Bilirubin: 0.7 mg/dL (ref 0.3–1.2)
Total Protein: 7.2 g/dL (ref 6.0–8.3)

## 2013-04-19 LAB — T-HELPER CELL (CD4) - (RCID CLINIC ONLY)
CD4 % Helper T Cell: 23 % — ABNORMAL LOW (ref 33–55)
CD4 T Cell Abs: 320 uL — ABNORMAL LOW (ref 400–2700)

## 2013-04-19 MED ORDER — HYDROCODONE-ACETAMINOPHEN 5-325 MG PO TABS: 1.0000 | ORAL_TABLET | Freq: Four times a day (QID) | ORAL | Status: DC | PRN

## 2013-04-19 NOTE — Patient Instructions (Signed)
Keep up the good work. Follow up with Dr Orvan Falconer as we discussed.

## 2013-04-19 NOTE — Progress Notes (Unsigned)
Patient ID: Christopher [name], male   DOB: 1951-07-28, 62 y.o.   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 Over the past year he had combination of surgery and prolonged antibiotics that have appeared to have cured the osteomyelitis.  He has limited opening of his mouth and periodic mandibular dislocation. He can realign his jaw himself and he overall has adapted well to this disability. He is able to keep his weight up now and his adaptation has been amazing. His currrent antiviral regimen is dolutegravir and Abacavir and he is suppressed on this. His CD4 count has never completely recovered, today 290/cubic ml, and ranges between high 200's to 300's. His anxiety is nearly constant and we are in process of switching him from very high doses of daily alprazolam to clonapin currently at one mg BID and eventually to replace alprazolam.    Christopher [name],which is ok with him, has had no fevers, new pains or complaints. In fact he feels that he is in best health compared to the last two years.  Exam BP 150/81  Pulse 74  Temp(Src) 98.1 F (36.7 C) (Oral)  Wt 162 lb (73.483 kg)  BMI 23.24 kg/m2    Left mandible is not painful and he has no internal or external draining tracts. Oral mucosa is dry but without ulcers or masses.  No cervical adenopathy. Chest clear and no murmurs or rubs.     Impression and plans 1.HIV  He is doing very well on current regimen as above and will not change. Historically Christopher [name] has adhered to his medications well. 2.Anxiety working on switching off alprazolam to clonapin and will taper alprazolam from 4mg  to 2mg  over next several months. 3.s/p oral cancer and mandibular osteo necrosis and osteomyelitis. He has generally adapted very well. He will be cared  for graciously by my partner, Dr. Cliffton Asters after my retirement in a month. Lina Sayre

## 2013-04-21 LAB — HIV-1 RNA QUANT-NO REFLEX-BLD: HIV 1 RNA Quant: <20 copies/mL (ref <20)

## 2013-05-02 ENCOUNTER — Other Ambulatory Visit: Payer: BC Managed Care – PPO | Admitting: Lab

## 2013-05-02 ENCOUNTER — Ambulatory Visit: Payer: BC Managed Care – PPO | Admitting: Oncology

## 2013-05-17 ENCOUNTER — Ambulatory Visit: Payer: PRIVATE HEALTH INSURANCE | Admitting: Infectious Diseases

## 2013-06-09 ENCOUNTER — Other Ambulatory Visit: Payer: Self-pay | Admitting: Oncology

## 2013-07-01 ENCOUNTER — Telehealth: Payer: Self-pay | Admitting: Oncology

## 2013-07-01 NOTE — Telephone Encounter (Signed)
pt was very irrate and exxpressed that he wanted to cx his appts and did not want to r/s.

## 2013-07-02 ENCOUNTER — Encounter: Payer: Self-pay | Admitting: *Deleted

## 2013-07-02 ENCOUNTER — Ambulatory Visit: Payer: Self-pay | Admitting: Oncology

## 2013-07-02 ENCOUNTER — Other Ambulatory Visit: Payer: Self-pay | Admitting: Lab

## 2013-07-16 ENCOUNTER — Telehealth: Payer: Self-pay | Admitting: *Deleted

## 2013-07-16 NOTE — Telephone Encounter (Signed)
Pt called to let us know he requested refills via his pharmacy for klonopin and restoril. Pt also notes he still feels anxious on the klonopin and would like to discuss either an increase in klonopin or changing back to xanax with Dr. Orvan Falconer at his office visit 9/30. Pt is transferring care from Dr. Maurice March to Dr. Orvan Falconer. Refill requests have not yet been received from pharmacy.  OK to fill as prescribed until pt sees Dr. Orvan Falconer for his first visit?  Please advise. Andree Coss, RN

## 2013-07-16 NOTE — Telephone Encounter (Signed)
Please refill Klonopin and Restoril at current doses.

## 2013-07-17 ENCOUNTER — Other Ambulatory Visit: Payer: Self-pay | Admitting: *Deleted

## 2013-07-17 DIAGNOSIS — F411 Generalized anxiety disorder: Secondary | ICD-10-CM

## 2013-07-17 DIAGNOSIS — G4700 Insomnia, unspecified: Secondary | ICD-10-CM

## 2013-07-17 MED ORDER — CLONAZEPAM 1 MG PO TABS: 1.0000 mg | ORAL_TABLET | Freq: Two times a day (BID) | ORAL | Status: DC | PRN

## 2013-07-17 MED ORDER — TEMAZEPAM 15 MG PO CAPS: 30.0000 mg | ORAL_CAPSULE | Freq: Every evening | ORAL | Status: DC | PRN

## 2013-07-17 NOTE — Telephone Encounter (Signed)
yes

## 2013-07-17 NOTE — Telephone Encounter (Signed)
Prescription refills called in to his pharmacy.

## 2013-08-06 ENCOUNTER — Ambulatory Visit (INDEPENDENT_AMBULATORY_CARE_PROVIDER_SITE_OTHER): Payer: PRIVATE HEALTH INSURANCE | Admitting: *Deleted

## 2013-08-06 ENCOUNTER — Other Ambulatory Visit: Payer: PRIVATE HEALTH INSURANCE

## 2013-08-06 DIAGNOSIS — B20 Human immunodeficiency virus [HIV] disease: Secondary | ICD-10-CM

## 2013-08-06 DIAGNOSIS — Z79899 Other long term (current) drug therapy: Secondary | ICD-10-CM

## 2013-08-06 DIAGNOSIS — Z23 Encounter for immunization: Secondary | ICD-10-CM

## 2013-08-06 DIAGNOSIS — C109 Malignant neoplasm of oropharynx, unspecified: Secondary | ICD-10-CM

## 2013-08-06 DIAGNOSIS — Z113 Encounter for screening for infections with a predominantly sexual mode of transmission: Secondary | ICD-10-CM

## 2013-08-06 LAB — CBC WITH DIFFERENTIAL/PLATELET
Basophils Absolute: 0 10*3/uL (ref 0.0–0.1)
Basophils Relative: 1 % (ref 0–1)
Eosinophils Absolute: 0.1 10*3/uL (ref 0.0–0.7)
MCHC: 36.9 g/dL — ABNORMAL HIGH (ref 30.0–36.0)
Neutro Abs: 2 10*3/uL (ref 1.7–7.7)
Neutrophils Relative %: 55 % (ref 43–77)
Platelets: 196 10*3/uL (ref 150–400)
RDW: 13.9 % (ref 11.5–15.5)

## 2013-08-06 LAB — COMPREHENSIVE METABOLIC PANEL
ALT: 23 U/L (ref 0–53)
Albumin: 4.3 g/dL (ref 3.5–5.2)
CO2: 28 mEq/L (ref 19–32)
Calcium: 9.8 mg/dL (ref 8.4–10.5)
Chloride: 102 mEq/L (ref 96–112)
Glucose, Bld: 149 mg/dL — ABNORMAL HIGH (ref 70–99)
Potassium: 4 mEq/L (ref 3.5–5.3)
Sodium: 139 mEq/L (ref 135–145)
Total Bilirubin: 0.7 mg/dL (ref 0.3–1.2)
Total Protein: 7.5 g/dL (ref 6.0–8.3)

## 2013-08-06 LAB — LIPID PANEL
Cholesterol: 202 mg/dL — ABNORMAL HIGH (ref 0–200)
HDL: 41 mg/dL
LDL Cholesterol: 86 mg/dL (ref 0–99)
Total CHOL/HDL Ratio: 4.9 ratio
Triglycerides: 376 mg/dL — ABNORMAL HIGH
VLDL: 75 mg/dL — ABNORMAL HIGH (ref 0–40)

## 2013-08-06 LAB — SYPHILIS: RPR W/REFLEX TO RPR TITER AND TREPONEMAL ANTIBODIES, TRADITIONAL SCREENING AND DIAGNOSIS ALGORITHM

## 2013-08-07 LAB — T-HELPER CELL (CD4) - (RCID CLINIC ONLY)
CD4 % Helper T Cell: 23 % — ABNORMAL LOW (ref 33–55)
CD4 T Cell Abs: 290 /uL — ABNORMAL LOW (ref 400–2700)

## 2013-08-07 LAB — HIV-1 RNA QUANT-NO REFLEX-BLD
HIV 1 RNA Quant: <20 copies/mL (ref <20)
HIV-1 RNA Quant, Log: <1.3 {Log} (ref <1.30)

## 2013-08-19 DIAGNOSIS — N289 Disorder of kidney and ureter, unspecified: Secondary | ICD-10-CM | POA: Insufficient documentation

## 2013-08-20 ENCOUNTER — Other Ambulatory Visit: Payer: Self-pay | Admitting: Internal Medicine

## 2013-08-20 ENCOUNTER — Ambulatory Visit (INDEPENDENT_AMBULATORY_CARE_PROVIDER_SITE_OTHER): Payer: PRIVATE HEALTH INSURANCE | Admitting: Internal Medicine

## 2013-08-20 ENCOUNTER — Encounter: Payer: Self-pay | Admitting: Internal Medicine

## 2013-08-20 VITALS — BP 143/90 | HR 73 | Temp 97.7°F | Ht 70.0 in | Wt 161.0 lb

## 2013-08-20 DIAGNOSIS — N289 Disorder of kidney and ureter, unspecified: Secondary | ICD-10-CM

## 2013-08-20 DIAGNOSIS — G4700 Insomnia, unspecified: Secondary | ICD-10-CM

## 2013-08-20 DIAGNOSIS — F411 Generalized anxiety disorder: Secondary | ICD-10-CM

## 2013-08-20 DIAGNOSIS — Z23 Encounter for immunization: Secondary | ICD-10-CM

## 2013-08-20 DIAGNOSIS — B20 Human immunodeficiency virus [HIV] disease: Secondary | ICD-10-CM

## 2013-08-20 MED ORDER — TEMAZEPAM 15 MG PO CAPS: 30.0000 mg | ORAL_CAPSULE | Freq: Every evening | ORAL | Status: DC | PRN

## 2013-08-20 MED ORDER — CLONAZEPAM 1 MG PO TABS: 1.0000 mg | ORAL_TABLET | Freq: Three times a day (TID) | ORAL | Status: DC | PRN

## 2013-08-20 NOTE — Progress Notes (Signed)
Patient ID: Christopher [name], male   DOB: 08/23/1951, 62 y.o.   MRN: [id number]          [facility] for Infectious Disease  Patient Active Problem List   Diagnosis Date Noted  . Renal insufficiency 08/19/2013  . Hypothyroidism 11/01/2012  . 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 Hyperglycemia 12/08/2011  . HYPOGONADISM 12/23/2009  . OTHER&UNSPECIFIED DISEASES THE ORAL SOFT TISSUES 08/18/2008  . DIAB W/RENAL MANIFESTS TYPE II/UNS NOT UNCNTRL 01/03/2008  . Dyslipidemia 01/03/2008  . SYPHILIS, [place], LATENT NOS 01/18/2007  . HIV DISEASE 12/01/2006  . HYPERTENSION 12/01/2006  . PROTEINURIA 12/01/2006    Patient's Medications  New Prescriptions   No medications on file  Previous Medications   ABACAVIR-LAMIVUDINE (EPZICOM) 600-300 MG PER TABLET    Take 1 tablet by mouth daily.   BENAZEPRIL-HYDROCHLORTHIAZIDE (LOTENSIN HCT) 10-12.5 MG PER TABLET    TAKE 1 TABLET BY MOUTH EVERY DAY   CHLORHEXIDINE (PERIDEX) 0.12 % SOLUTION    Use as directed 15 mLs in the mouth or throat 2 (two) times daily.   DOLUTEGRAVIR SODIUM (TIVICAY) 50 MG TABS    Take 1 tablet (50 mg total) by mouth daily.   DRONABINOL (MARINOL) 5 MG CAPSULE    Take 1 capsule (5 mg total) by mouth 2 (two) times daily before a meal.   HYDROCODONE-ACETAMINOPHEN (NORCO/VICODIN) 5-325 MG PER TABLET    Take 1 tablet by mouth every 6 (six) hours as needed for pain.   LEVOTHYROXINE (SYNTHROID, LEVOTHROID) 50 MCG TABLET    TAKE 1 TABLET (50 MCG TOTAL) BY MOUTH DAILY.   LEVOTHYROXINE (SYNTHROID, LEVOTHROID) 50 MCG TABLET    TAKE 1 TABLET (50 MCG TOTAL) BY MOUTH DAILY.   PILOCARPINE (SALAGEN) 5 MG TABLET    Take 5 mg by mouth 3 (three) times daily.   PILOCARPINE (SALAGEN) 5 MG TABLET    TAKE 1 TABLET (5 MG TOTAL) BY MOUTH 3 (THREE) TIMES DAILY.   TEMAZEPAM (RESTORIL) 15 MG CAPSULE    Take 2 capsules (30 mg total) by mouth at bedtime as needed for sleep.  Modified Medications   Modified Medication Previous Medication   CLONAZEPAM  (KLONOPIN) 1 MG TABLET clonazePAM (KLONOPIN) 1 MG tablet      Take 1 tablet (1 mg total) by mouth 3 (three) times daily as needed for anxiety.    Take 1 tablet (1 mg total) by mouth 2 (two) times daily as needed for anxiety.  Discontinued Medications   PREGABALIN (LYRICA) 50 MG CAPSULE    Take 1 capsule (50 mg total) by mouth 2 (two) times daily.    Subjective: Christopher [name] is in for his first visit with me today as he changes his HIV care from my partner, Dr. Maurice March, who has retired to me. He thinks his only missed one or 2 doses of his Epzicom and Tivicay since his last visit. He states that he is doing very well with the exception of chronic problems staying asleep at night and problems with his chronic anxiety. He was changed from alprazolam to clonazepam by Dr. Maurice March at the time of his last visit. He notes increased anxiety particularly in late afternoon. He states he and his doses about 1 AM and then in the [place] morning. He states that he wakes up about 6 times each evening, primarily because of severe dry mouth related to his previous oral cancer. He is taking temazepam 15 mg when he goes to bed and then another 15 mg in the [place] morning. He  is asking to increase that to 3 pills each night.  Review of Systems: Constitutional: negative Eyes: negative Ears, nose, mouth, throat, and face: positive for chronic dry mouth, negative for sore mouth and sore throat Respiratory: negative Cardiovascular: negative Gastrointestinal: negative Genitourinary:negative  Past Medical History  Diagnosis Date  . Tonsillar cancer     tonsillar ca   . Hypertension   . HIV DISEASE 12/01/2006  . SYPHILIS, [place], LATENT NOS 01/18/2007  . HYPOGONADISM 12/23/2009  . HYPERLIPIDEMIA, WITH LOW HDL 01/03/2008  . HYPERTENSION 12/01/2006    History  Substance Use Topics  . Smoking status: Never Smoker   . Smokeless tobacco: Never Used  . Alcohol Use: No     Comment: social    No family history on file.  Allergies    Allergen Reactions  . Codeine   . Sulfamethoxazole-Trimethoprim   . Sulfonamide Derivatives     Objective: Temp: 97.7 F (36.5 C) (09/30 1007) Temp src: Oral (09/30 1007) BP: 143/90 mmHg (09/30 1007) Pulse Rate: 73 (09/30 1007)  General: He is in good spirits Oral: Dry mucous membranes but no oral pharyngeal lesions Skin: No rash Lungs: Clear Cor: Regular S1 and S2 no murmurs Abdomen: Soft and nontender Joints and extremities: No acute abnormalities Neuro: Alert with normal speech and conversation Mood and affect: Normal  Lab Results HIV 1 RNA Quant (copies/mL)  Date Value  08/06/2013 <20   04/19/2013 <20   01/07/2013 <20      CD4 T Cell Abs (/uL)  Date Value  08/06/2013 290*  04/19/2013 320*  01/07/2013 260*   Lab Results  Component Value Date   WBC 3.6* 08/06/2013   HGB 15.5 08/06/2013   HCT 42.0 08/06/2013   MCV 97.7 08/06/2013   PLT 196 08/06/2013   BMET    Component Value Date/Time   NA 139 08/06/2013 0927   NA 140 10/31/2011 1039   K 4.0 08/06/2013 0927   K 4.4 10/31/2011 1039   CL 102 08/06/2013 0927   CL 100 10/31/2011 1039   CO2 28 08/06/2013 0927   CO2 30 10/31/2011 1039   GLUCOSE 149* 08/06/2013 0927   GLUCOSE 97 10/31/2011 1039   BUN 16 08/06/2013 0927   BUN 18 10/31/2011 1039   CREATININE 1.40* 08/06/2013 0927   CREATININE 1.14 07/02/2012 0856   CALCIUM 9.8 08/06/2013 0927   CALCIUM 9.4 10/31/2011 1039   GFRNONAA >60 07/07/2010 0718   GFRAA  Value: >60        The eGFR has been calculated using the MDRD equation. This calculation has not been validated in all clinical situations. eGFR's persistently <60 mL/min signify possible Chronic Kidney Disease. 07/07/2010 0718   Lab Results  Component Value Date   ALT 23 08/06/2013   AST 19 08/06/2013   ALKPHOS 65 08/06/2013   BILITOT 0.7 08/06/2013   Lab Results  Component Value Date   CHOL 202* 08/06/2013   HDL 41 08/06/2013   LDLCALC 86 08/06/2013   TRIG 376* 08/06/2013   CHOLHDL 4.9 08/06/2013     Assessment: His HIV infection remains under excellent control. I will continue Epzicom and Tivicay and I have encouraged him to try not to Miss a single dose of his medications.  I will increase his clonazepam to 3 times daily and I have encouraged him to accept a referral for mental health counseling. He is somewhat reluctant to accept this.  He has chronic insomnia. I do not think it is a good idea to increase his  temazepam. He has developed tolerance to benzodiazepines.  Plan: 1. Continue current antiretroviral regimen 2. Increase Klonopin to 3 times daily 3. Recommend referral for mental health counseling 4. I encouraged him to set up a visit with Dr. Trula Slade for primary care.   Cliffton Asters, MD [facility] for Infectious Disease [facility] [phone] pager   [phone] cell 08/20/2013, 10:42 AM

## 2013-08-20 NOTE — Addendum Note (Signed)
Addended by: Andree Coss on: 08/20/2013 12:11 PM   Modules accepted: Orders

## 2013-08-31 ENCOUNTER — Other Ambulatory Visit: Payer: Self-pay | Admitting: Infectious Diseases

## 2013-09-28 ENCOUNTER — Other Ambulatory Visit: Payer: Self-pay | Admitting: Oncology

## 2013-09-28 DIAGNOSIS — C109 Malignant neoplasm of oropharynx, unspecified: Secondary | ICD-10-CM

## 2013-09-30 ENCOUNTER — Encounter: Payer: Self-pay | Admitting: Internal Medicine

## 2013-09-30 DIAGNOSIS — G629 Polyneuropathy, unspecified: Secondary | ICD-10-CM | POA: Insufficient documentation

## 2013-09-30 NOTE — Telephone Encounter (Signed)
ON 01/18/13 PT. WAS SEEN BY DR.HA. THE DICTATION STATED PT. WAS LOOKING FOR A NEW PCP AS DR.LANE IS RETIRING. ON 07/01/13 PT. CALLED TO CANCEL HIS APPOINTMENT AND DID NOT WANT TO RESCHEDULE. [facility] REFILL REQUEST WAS REFUSED WITH THE REQUEST TO SEND TO PT.'S PRIMARY CARE PHYSICIAN.

## 2013-10-01 ENCOUNTER — Ambulatory Visit (INDEPENDENT_AMBULATORY_CARE_PROVIDER_SITE_OTHER): Payer: PRIVATE HEALTH INSURANCE | Admitting: Internal Medicine

## 2013-10-01 ENCOUNTER — Encounter: Payer: Self-pay | Admitting: Internal Medicine

## 2013-10-01 VITALS — BP 160/93 | HR 87 | Temp 97.9°F | Ht 70.0 in | Wt 160.8 lb

## 2013-10-01 DIAGNOSIS — F329 Major depressive disorder, single episode, unspecified: Secondary | ICD-10-CM

## 2013-10-01 DIAGNOSIS — F32A Depression, unspecified: Secondary | ICD-10-CM | POA: Insufficient documentation

## 2013-10-01 DIAGNOSIS — M278 Other specified diseases of jaws: Secondary | ICD-10-CM

## 2013-10-01 DIAGNOSIS — F3289 Other specified depressive episodes: Secondary | ICD-10-CM

## 2013-10-01 DIAGNOSIS — M272 Inflammatory conditions of jaws: Secondary | ICD-10-CM

## 2013-10-01 MED ORDER — FLUOXETINE HCL 20 MG PO CAPS: 20.0000 mg | ORAL_CAPSULE | Freq: Every day | ORAL | Status: DC

## 2013-10-01 MED ORDER — TRAMADOL HCL 50 MG PO TABS: 50.0000 mg | ORAL_TABLET | Freq: Four times a day (QID) | ORAL | Status: DC | PRN

## 2013-10-01 NOTE — Progress Notes (Signed)
Patient ID: KJ IMBERT, male   DOB: 1951/01/11, 62 y.o.   MRN: [id number]          [facility] for Infectious Disease  Patient Active Problem List   Diagnosis Date Noted  . Peripheral neuropathy 09/30/2013    Priority: High  . HIV DISEASE 12/01/2006    Priority: High  . Depression 10/01/2013    Priority: Medium  . Renal insufficiency 08/19/2013  . Hypothyroidism 11/01/2012  . 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 Hyperglycemia 12/08/2011  . HYPOGONADISM 12/23/2009  . Oral cancer 08/18/2008  . Dyslipidemia 01/03/2008  . SYPHILIS, [place], LATENT NOS 01/18/2007  . HYPERTENSION 12/01/2006  . PROTEINURIA 12/01/2006    Patient's Medications  New Prescriptions   FLUOXETINE (PROZAC) 20 MG CAPSULE    Take 1 capsule (20 mg total) by mouth daily.   TRAMADOL (ULTRAM) 50 MG TABLET    Take 1 tablet (50 mg total) by mouth every 6 (six) hours as needed.  Previous Medications   ABACAVIR-LAMIVUDINE (EPZICOM) 600-300 MG PER TABLET    Take 1 tablet by mouth daily.   BENAZEPRIL-HYDROCHLORTHIAZIDE (LOTENSIN HCT) 10-12.5 MG PER TABLET    TAKE 1 TABLET BY MOUTH EVERY DAY   CHLORHEXIDINE (PERIDEX) 0.12 % SOLUTION    Use as directed 15 mLs in the mouth or throat 2 (two) times daily.   CLONAZEPAM (KLONOPIN) 1 MG TABLET    Take 1 tablet (1 mg total) by mouth 3 (three) times daily as needed for anxiety.   DOLUTEGRAVIR SODIUM (TIVICAY) 50 MG TABS    Take 1 tablet (50 mg total) by mouth daily.   DRONABINOL (MARINOL) 5 MG CAPSULE    Take 1 capsule (5 mg total) by mouth 2 (two) times daily before a meal.   LEVOTHYROXINE (SYNTHROID, LEVOTHROID) 50 MCG TABLET    TAKE 1 TABLET (50 MCG TOTAL) BY MOUTH DAILY.   PILOCARPINE (SALAGEN) 5 MG TABLET    TAKE 1 TABLET (5 MG TOTAL) BY MOUTH 3 (THREE) TIMES DAILY.   TEMAZEPAM (RESTORIL) 15 MG CAPSULE    Take 2 capsules (30 mg total) by mouth at bedtime as needed for sleep.  Modified Medications   No medications on file  Discontinued Medications   HYDROCODONE-ACETAMINOPHEN (NORCO/VICODIN) 5-325 MG PER TABLET    Take 1 tablet by mouth every 6 (six) hours as needed for pain.   LEVOTHYROXINE (SYNTHROID, LEVOTHROID) 50 MCG TABLET    TAKE 1 TABLET (50 MCG TOTAL) BY MOUTH DAILY.   PILOCARPINE (SALAGEN) 5 MG TABLET    Take 5 mg by mouth 3 (three) times daily.    Subjective: [name] is seen on a work in basis. He has been bothered by numbness tingling and intermittent pain in his arms, hands and feet but has been much worse over the past year. He states that Dr. Maurice March put him on Lyrica at many years ago. He has a medication list on Smart phone that shows a dose of 50 mg 3 times daily. An old prescription in [facility] shows a dose of 50 mg twice daily and a note from Dr. Gaylyn Rong in February was at a dose of 50 mg daily. He thinks he took it several times a day for about one month and did not notice any relief so he stopped it. He does not recall being on any other medications for neuropathy. It is most bothersome when he first goes to bed at night and will make it difficult for him to sleep.  His also been bothered by  depression that's been worse since his cancer diagnosis. He was in counseling many years ago when he was first diagnosed with HIV infection. He recalls taking Prozac which did help but then he finally stopped it when he no longer felt depressed. He says that he is not interested in counseling now. He says that he feels somewhat sad and depressed and he lacks motivation to do anything. He denies having any thoughts of hurting himself or others.  His also bothered by some chronic jaw pain related to his oral cancer and osteonecrosis of the mandible. He used to take hydrocodone but has been out of that. He is requesting something to take for pain as needed.  Review of Systems: Constitutional: positive for fatigue, negative for anorexia, chills, fevers, sweats and weight loss Eyes: negative Ears, nose, mouth, throat, and face: negative Respiratory:  negative Cardiovascular: negative Gastrointestinal: negative Genitourinary:negative Neurological: positive for paresthesia, negative for coordination problems, dizziness, gait problems, headaches, seizures and weakness Behavioral/Psych: positive for depression and loss of interest in favorite activities, negative for anxiety  Past Medical History  Diagnosis Date  . Tonsillar cancer     tonsillar ca   . Hypertension   . HIV DISEASE 12/01/2006  . SYPHILIS, [place], LATENT NOS 01/18/2007  . HYPOGONADISM 12/23/2009  . HYPERLIPIDEMIA, WITH LOW HDL 01/03/2008  . HYPERTENSION 12/01/2006    History  Substance Use Topics  . Smoking status: Never Smoker   . Smokeless tobacco: Never Used  . Alcohol Use: No     Comment: social    No family history on file.  Allergies  Allergen Reactions  . Codeine   . Sulfamethoxazole-Trimethoprim   . Sulfonamide Derivatives     Objective: Temp: 97.9 F (36.6 C) (11/11 1114) Temp src: Oral (11/11 1114) BP: 160/93 mmHg (11/11 1114) Pulse Rate: 87 (11/11 1114)  General: He is concerned but in no distress Oral: No acute oropharyngeal lesions Skin: No rash Lungs: Clear Cor: Regular S1 and S2 with no murmurs Abdomen: Nontender Joints and extremities: Normal with no acute abnormalities Neuro: Alert with normal speech and conversation. No focal weakness. Normal light touch sensation in all extremities Mood and affect: Quiet and reserved but he does not appear depressed  Lab Results HIV 1 RNA Quant (copies/mL)  Date Value  08/06/2013 <20   04/19/2013 <20   01/07/2013 <20      CD4 T Cell Abs (/uL)  Date Value  08/06/2013 290*  04/19/2013 320*  01/07/2013 260*     Assessment: He has some mild to moderate depression and multifactorial, painful peripheral neuropathy. He does not want to retry Lyrica. I reviewed options with him and feel it is best that we put him back on the Prozac for both his depression and to see what impact it may have on his  neuropathy.  I would prefer not to have him on chronic hydrocodone. He had problems with constipation when taking it previously. I will try him on low-dose tramadol.  His HIV is under very good control.  Plan: 1. Continue current antiretroviral regimen 2. Start Prozac 20 mg daily 3. Start tramadol 50 mg every 6 hours as needed for pain 4. Followup in 4 weeks   Cliffton Asters, MD [facility] for Infectious Disease [facility] [phone] pager   [phone] cell 10/01/2013, 11:57 AM

## 2013-10-29 ENCOUNTER — Encounter: Payer: Self-pay | Admitting: Internal Medicine

## 2013-10-29 ENCOUNTER — Ambulatory Visit (INDEPENDENT_AMBULATORY_CARE_PROVIDER_SITE_OTHER): Payer: PRIVATE HEALTH INSURANCE | Admitting: Internal Medicine

## 2013-10-29 VITALS — BP 148/100 | HR 75 | Temp 97.8°F | Ht 70.0 in | Wt 156.5 lb

## 2013-10-29 DIAGNOSIS — B20 Human immunodeficiency virus [HIV] disease: Secondary | ICD-10-CM

## 2013-10-29 NOTE — Progress Notes (Signed)
Patient ID: Christopher [name], male   DOB: 1951-10-10, 62 y.o.   MRN: [id number]          [facility] for Infectious Disease  Patient Active Problem List   Diagnosis Date Noted  . Peripheral neuropathy 09/30/2013    Priority: High  . HIV DISEASE 12/01/2006    Priority: High  . Depression 10/01/2013    Priority: Medium  . Renal insufficiency 08/19/2013  . Hypothyroidism 11/01/2012  . 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 Hyperglycemia 12/08/2011  . HYPOGONADISM 12/23/2009  . Oral cancer 08/18/2008  . Dyslipidemia 01/03/2008  . SYPHILIS, [place], LATENT NOS 01/18/2007  . HYPERTENSION 12/01/2006  . PROTEINURIA 12/01/2006    Patient's Medications  New Prescriptions   No medications on file  Previous Medications   ABACAVIR-LAMIVUDINE (EPZICOM) 600-300 MG PER TABLET    Take 1 tablet by mouth daily.   BENAZEPRIL-HYDROCHLORTHIAZIDE (LOTENSIN HCT) 10-12.5 MG PER TABLET    TAKE 1 TABLET BY MOUTH EVERY DAY   CHLORHEXIDINE (PERIDEX) 0.12 % SOLUTION    Use as directed 15 mLs in the mouth or throat 2 (two) times daily.   CLONAZEPAM (KLONOPIN) 1 MG TABLET    Take 1 tablet (1 mg total) by mouth 3 (three) times daily as needed for anxiety.   DOLUTEGRAVIR SODIUM (TIVICAY) 50 MG TABS    Take 1 tablet (50 mg total) by mouth daily.   DRONABINOL (MARINOL) 5 MG CAPSULE    Take 1 capsule (5 mg total) by mouth 2 (two) times daily before a meal.   FLUOXETINE (PROZAC) 20 MG CAPSULE    Take 1 capsule (20 mg total) by mouth daily.   LEVOTHYROXINE (SYNTHROID, LEVOTHROID) 50 MCG TABLET    TAKE 1 TABLET (50 MCG TOTAL) BY MOUTH DAILY.   PILOCARPINE (SALAGEN) 5 MG TABLET    TAKE 1 TABLET (5 MG TOTAL) BY MOUTH 3 (THREE) TIMES DAILY.   TEMAZEPAM (RESTORIL) 15 MG CAPSULE    Take 2 capsules (30 mg total) by mouth at bedtime as needed for sleep.   TRAMADOL (ULTRAM) 50 MG TABLET    Take 1 tablet (50 mg total) by mouth every 6 (six) hours as needed.  Modified Medications   No medications on file  Discontinued  Medications   No medications on file    Subjective: [name] is in for his followup visit. He states that he is feeling much better since starting Prozac after his last visit. His mood is much brighter and he now feels more motivated to go out and do things. He says that he feels like he is out of his funk. He does not notice any change in his uncomfortable peripheral neuropathy. It does not bother him much during the day when he is distracted but he notes that when he first lays down at night. He has been taking tramadol for his jaw pain. He says that he does not need it every day but takes it maybe once every 2-3 days with improvement. He has not missed any doses of his Epzicom or Tivicay.  Review of Systems: Constitutional: negative Eyes: negative Ears, nose, mouth, throat, and face: positive for chronic jaw pain, negative for sore mouth and sore throat Respiratory: negative Cardiovascular: negative Gastrointestinal: negative Genitourinary:negative  Past Medical History  Diagnosis Date  . Tonsillar cancer     tonsillar ca   . Hypertension   . HIV DISEASE 12/01/2006  . SYPHILIS, [place], LATENT NOS 01/18/2007  . HYPOGONADISM 12/23/2009  . HYPERLIPIDEMIA, WITH LOW HDL 01/03/2008  .  HYPERTENSION 12/01/2006    History  Substance Use Topics  . Smoking status: Never Smoker   . Smokeless tobacco: Never Used  . Alcohol Use: No     Comment: social    No family history on file.  Allergies  Allergen Reactions  . Codeine   . Sulfamethoxazole-Trimethoprim   . Sulfonamide Derivatives     Objective: Temp: 97.8 F (36.6 C) (12/09 1023) Temp src: Oral (12/09 1023) BP: 148/100 mmHg (12/09 1023) Pulse Rate: 75 (12/09 1023)  General: He is in much better spirits Oral: No oropharyngeal lesions Skin: No rash Lungs: Clear Cor: Regular S1 and S2 with no murmurs Abdomen: Nontender Mood and affect: His mood was brighter and he is much more talkative  Lab Results Lab Results  Component Value  Date   WBC 3.6* 08/06/2013   HGB 15.5 08/06/2013   HCT 42.0 08/06/2013   MCV 97.7 08/06/2013   PLT 196 08/06/2013    Lab Results  Component Value Date   CREATININE 1.40* 08/06/2013   BUN 16 08/06/2013   NA 139 08/06/2013   K 4.0 08/06/2013   CL 102 08/06/2013   CO2 28 08/06/2013    Lab Results  Component Value Date   ALT 23 08/06/2013   AST 19 08/06/2013   ALKPHOS 65 08/06/2013   BILITOT 0.7 08/06/2013    Lab Results  Component Value Date   CHOL 202* 08/06/2013   HDL 41 08/06/2013   LDLCALC 86 08/06/2013   TRIG 376* 08/06/2013   CHOLHDL 4.9 08/06/2013    Lab Results HIV 1 RNA Quant (copies/mL)  Date Value  08/06/2013 <20   04/19/2013 <20   01/07/2013 <20      CD4 T Cell Abs (/uL)  Date Value  08/06/2013 290*  04/19/2013 320*  01/07/2013 260*     Lab Results  Component Value Date   WBC 3.6* 08/06/2013   HGB 15.5 08/06/2013   HCT 42.0 08/06/2013   MCV 97.7 08/06/2013   PLT 196 08/06/2013   BMET    Component Value Date/Time   NA 139 08/06/2013 0927   NA 140 10/31/2011 1039   K 4.0 08/06/2013 0927   K 4.4 10/31/2011 1039   CL 102 08/06/2013 0927   CL 100 10/31/2011 1039   CO2 28 08/06/2013 0927   CO2 30 10/31/2011 1039   GLUCOSE 149* 08/06/2013 0927   GLUCOSE 97 10/31/2011 1039   BUN 16 08/06/2013 0927   BUN 18 10/31/2011 1039   CREATININE 1.40* 08/06/2013 0927   CREATININE 1.14 07/02/2012 0856   CALCIUM 9.8 08/06/2013 0927   CALCIUM 9.4 10/31/2011 1039   GFRNONAA >60 07/07/2010 0718   GFRAA  Value: >60        The eGFR has been calculated using the MDRD equation. This calculation has not been validated in all clinical situations. eGFR's persistently <60 mL/min signify possible Chronic Kidney Disease. 07/07/2010 0718   Lab Results  Component Value Date   ALT 23 08/06/2013   AST 19 08/06/2013   ALKPHOS 65 08/06/2013   BILITOT 0.7 08/06/2013   Lab Results  Component Value Date   CHOL 202* 08/06/2013   HDL 41 08/06/2013   LDLCALC 86 08/06/2013   TRIG 376* 08/06/2013   CHOLHDL 4.9  08/06/2013   Assessment: His HIV infection remains under excellent control. I will continue his current regimen. If his creatinine does not continue to increase will consider switching him to Triumeq.  His depression has improved. I will continue Prozac.  His peripheral neuropathy is unchanged. He does not want to start any new medication at this time.  His chronic pain is under good control with occasional doses of tramadol.  Plan: 1. Continue current medications 2. Followup after lab work in 6 months   Cliffton Asters, MD [facility] for Infectious Disease [facility] [phone] pager   [phone] cell 10/29/2013, 10:38 AM

## 2013-12-09 ENCOUNTER — Other Ambulatory Visit: Payer: Self-pay | Admitting: Infectious Disease

## 2013-12-09 ENCOUNTER — Other Ambulatory Visit: Payer: Self-pay | Admitting: Internal Medicine

## 2013-12-15 ENCOUNTER — Other Ambulatory Visit: Payer: Self-pay | Admitting: Internal Medicine

## 2013-12-16 ENCOUNTER — Telehealth: Payer: Self-pay | Admitting: *Deleted

## 2013-12-16 ENCOUNTER — Other Ambulatory Visit: Payer: Self-pay | Admitting: *Deleted

## 2013-12-16 DIAGNOSIS — R6884 Jaw pain: Secondary | ICD-10-CM

## 2013-12-16 MED ORDER — TRAMADOL HCL 50 MG PO TABS: 50.0000 mg | ORAL_TABLET | Freq: Four times a day (QID) | ORAL | Status: DC | PRN

## 2013-12-16 NOTE — Telephone Encounter (Signed)
Per patient his insurance is only filling Tivicay and Epzicom for partial month. Spoke with insurance and the Tivicay is approved for 30 per month, but pharmacy needs to resubmit the Epzicom and then once denied a prior auth can be submitted. [facility] notified to resubmit Epzicom. Myrtis Hopping

## 2013-12-17 ENCOUNTER — Telehealth: Payer: Self-pay | Admitting: *Deleted

## 2013-12-17 NOTE — Telephone Encounter (Signed)
Per patient's insurance [employer], Epzicom and Humphrey Rolls is approved for quantities of 30 per month until 12/16/14. Authorization # [id number] Phone # [place]

## 2013-12-31 ENCOUNTER — Telehealth: Payer: Self-pay | Admitting: *Deleted

## 2013-12-31 DIAGNOSIS — B20 Human immunodeficiency virus [HIV] disease: Secondary | ICD-10-CM

## 2013-12-31 NOTE — Telephone Encounter (Signed)
Patient will be without Tivicay for 1 month.  He may have inadvertently discarded new refill of tivicay, but is unable to fill it with his pharmacy at this time (pharmacy states he picked up a refill 3 days ago, patient is unable to find the pills).  Patient advised to contact his insurance company to see if they will authorize an emergency refill or vacation override.  Patient will do so.  RN unable to supply patient with Tivicay at this time.  Spoke with Jasmine December - she is consulting resources that may be available.  ([facility] unable to help; PAN unlikely as his insurance is not authorizing another refill at this time) Patient states he has been compliant with medications since diagnosis, even throughout his cancer treatments.  He is concerned about what may happen if he takes a drug holiday, would like Dr. Megan Salon to be aware of what is going on.

## 2014-01-01 MED ORDER — ABACAVIR-DOLUTEGRAVIR-LAMIVUD 600-50-300 MG PO TABS: 1.0000 | ORAL_TABLET | Freq: Every day | ORAL | Status: DC

## 2014-01-01 NOTE — Addendum Note (Signed)
Addended by: Michel Bickers on: 01/01/2014 10:02 AM   Modules accepted: Orders, Medications

## 2014-01-01 NOTE — Telephone Encounter (Signed)
I will change this to drug regimen of Epzicom and Tivicay to once daily Triumeq. He will be getting the exact same medications but in a single tablet regimen and his insurance plan and pharmacy should be willing to fill this new regimen. That way he will not be required to be off medication for one month.

## 2014-01-02 NOTE — Telephone Encounter (Signed)
Checked with patient today - he was able to get an override authorization from his insurance company for 1 extra refill of [place].  He picked up the Tivicay 2/11 and did not miss a dose.  He is excited to try the once-daily Triumeq starting next month, stating that it would save him the copay!  He is greatly appreciative of the lengths we went to so that he wouldn't miss a dose.

## 2014-01-27 ENCOUNTER — Other Ambulatory Visit: Payer: Self-pay | Admitting: Internal Medicine

## 2014-01-28 ENCOUNTER — Telehealth: Payer: Self-pay | Admitting: *Deleted

## 2014-01-28 NOTE — Telephone Encounter (Signed)
PA for Triumeq already initiated by pharmacy on 3/10.  Should receive a decision 24-72 hours. Landis Gandy, RN

## 2014-01-29 NOTE — Telephone Encounter (Signed)
Prior approval notification fax obtained.  PA through 01/29/15 from [place].

## 2014-01-30 ENCOUNTER — Other Ambulatory Visit: Payer: Self-pay | Admitting: Internal Medicine

## 2014-01-30 ENCOUNTER — Other Ambulatory Visit: Payer: Self-pay | Admitting: *Deleted

## 2014-01-30 DIAGNOSIS — G4700 Insomnia, unspecified: Secondary | ICD-10-CM

## 2014-01-30 MED ORDER — TEMAZEPAM 15 MG PO CAPS: 30.0000 mg | ORAL_CAPSULE | Freq: Every evening | ORAL | Status: DC | PRN

## 2014-02-06 ENCOUNTER — Other Ambulatory Visit: Payer: PRIVATE HEALTH INSURANCE

## 2014-02-10 ENCOUNTER — Telehealth: Payer: Self-pay | Admitting: Internal Medicine

## 2014-02-10 NOTE — Telephone Encounter (Signed)
[name], Christopher [name]'s significant other, mentioned that he has become concerned about Christopher [name]'s memory loss. He states that they have spoken about this. He Christopher [name] forget where he put his keys and similar items. [name] is not sure if he is overreacting and wants to know what he should do. I offered to review the situation with Christopher [name] at the time of his next visit.

## 2014-02-15 ENCOUNTER — Other Ambulatory Visit: Payer: Self-pay | Admitting: Internal Medicine

## 2014-02-15 DIAGNOSIS — F411 Generalized anxiety disorder: Secondary | ICD-10-CM

## 2014-02-18 ENCOUNTER — Other Ambulatory Visit: Payer: Self-pay | Admitting: Internal Medicine

## 2014-02-20 ENCOUNTER — Ambulatory Visit: Payer: PRIVATE HEALTH INSURANCE | Admitting: Internal Medicine

## 2014-03-01 ENCOUNTER — Other Ambulatory Visit: Payer: Self-pay | Admitting: Internal Medicine

## 2014-03-23 ENCOUNTER — Other Ambulatory Visit: Payer: Self-pay | Admitting: Internal Medicine

## 2014-04-04 ENCOUNTER — Other Ambulatory Visit: Payer: Self-pay | Admitting: Hematology and Oncology

## 2014-04-04 ENCOUNTER — Telehealth: Payer: Self-pay | Admitting: *Deleted

## 2014-04-04 DIAGNOSIS — E039 Hypothyroidism, unspecified: Secondary | ICD-10-CM

## 2014-04-04 DIAGNOSIS — C069 Malignant neoplasm of mouth, unspecified: Secondary | ICD-10-CM

## 2014-04-04 MED ORDER — LEVOTHYROXINE SODIUM 50 MCG PO TABS: 50.0000 ug | ORAL_TABLET | Freq: Every day | ORAL | Status: DC

## 2014-04-04 NOTE — Telephone Encounter (Signed)
Refill request for [facility] from [facility].  Dr. Alvy Bimler refilled x one month and sent order to see pt in clinic w/ lab to check thyroid level.  Informed pt of [facility] for one month and he needs to have his thyroid level checked and see Dr. Alvy Bimler for future refills.  Explained MD needs to make sure she is prescribing appropriate dose of medication.   Pt states he is not sure he wants to return to [place] to see Medical Oncologist.  He will think about it.  Informed pt that he can choose to have his PCP manage his thyroid medication, but Dr. Alvy Bimler cannot manage it w/o seeing him.  He is also due for routine f/u visit. He verbalized understanding.  He will think about it.  Informed him to expect call from scheduler.

## 2014-04-06 ENCOUNTER — Telehealth: Payer: Self-pay | Admitting: Hematology and Oncology

## 2014-04-06 NOTE — Telephone Encounter (Signed)
lvm for pt regarding to June appt...mailed pt appt sched/letter °

## 2014-04-15 ENCOUNTER — Other Ambulatory Visit: Payer: Self-pay | Admitting: Dermatology

## 2014-04-16 ENCOUNTER — Other Ambulatory Visit (INDEPENDENT_AMBULATORY_CARE_PROVIDER_SITE_OTHER): Payer: PRIVATE HEALTH INSURANCE

## 2014-04-16 DIAGNOSIS — Z113 Encounter for screening for infections with a predominantly sexual mode of transmission: Secondary | ICD-10-CM

## 2014-04-16 DIAGNOSIS — Z79899 Other long term (current) drug therapy: Secondary | ICD-10-CM

## 2014-04-16 DIAGNOSIS — B20 Human immunodeficiency virus [HIV] disease: Secondary | ICD-10-CM

## 2014-04-16 LAB — CBC
HCT: 37.7 % — ABNORMAL LOW (ref 39.0–52.0)
Hemoglobin: 13.4 g/dL (ref 13.0–17.0)
MCH: 34.2 pg — AB (ref 26.0–34.0)
MCHC: 35.5 g/dL (ref 30.0–36.0)
MCV: 96.2 fL (ref 78.0–100.0)
PLATELETS: 219 10*3/uL (ref 150–400)
RBC: 3.92 MIL/uL — AB (ref 4.22–5.81)
RDW: 13.7 % (ref 11.5–15.5)
WBC: 6.9 10*3/uL (ref 4.0–10.5)

## 2014-04-17 LAB — COMPREHENSIVE METABOLIC PANEL
ALBUMIN: 4 g/dL (ref 3.5–5.2)
ALT: 17 U/L (ref 0–53)
AST: 15 U/L (ref 0–37)
Alkaline Phosphatase: 73 U/L (ref 39–117)
BUN: 18 mg/dL (ref 6–23)
CALCIUM: 9.9 mg/dL (ref 8.4–10.5)
CHLORIDE: 100 meq/L (ref 96–112)
CO2: 31 mEq/L (ref 19–32)
Creat: 1.84 mg/dL — ABNORMAL HIGH (ref 0.50–1.35)
GLUCOSE: 72 mg/dL (ref 70–99)
POTASSIUM: 4.2 meq/L (ref 3.5–5.3)
SODIUM: 140 meq/L (ref 135–145)
TOTAL PROTEIN: 6.7 g/dL (ref 6.0–8.3)
Total Bilirubin: 0.5 mg/dL (ref 0.2–1.2)

## 2014-04-17 LAB — LIPID PANEL
CHOLESTEROL: 182 mg/dL (ref 0–200)
HDL: 42 mg/dL (ref >39)
LDL Cholesterol: 76 mg/dL (ref 0–99)
Total CHOL/HDL Ratio: 4.3 Ratio
Triglycerides: 322 mg/dL — ABNORMAL HIGH (ref <150)
VLDL: 64 mg/dL — ABNORMAL HIGH (ref 0–40)

## 2014-04-17 LAB — T-HELPER CELL (CD4) - (RCID CLINIC ONLY)
CD4 % Helper T Cell: 24 % — ABNORMAL LOW (ref 33–55)
CD4 T Cell Abs: 360 /uL — ABNORMAL LOW (ref 400–2700)

## 2014-04-17 LAB — RPR

## 2014-04-17 LAB — HIV-1 RNA QUANT-NO REFLEX-BLD: HIV-1 RNA Quant, Log: <1.3 {Log} (ref <1.30)

## 2014-04-19 ENCOUNTER — Other Ambulatory Visit: Payer: Self-pay | Admitting: Internal Medicine

## 2014-04-21 ENCOUNTER — Telehealth: Payer: Self-pay | Admitting: *Deleted

## 2014-04-21 NOTE — Telephone Encounter (Signed)
Patient came by clinic today to inquire if Dr Megan Salon would do lab work and follow his thyroid level. He advised his doctor has moved away and he just does not want to be followed by another doctor nor can he afford the copays for all the doctors he has to see. He advised that since we draw blood anyway if we could draw his thyroid level. Advised the patient will ask the doctor and get back to him with an answer. He also takes Synthroid and wants to know if we will refill that also.

## 2014-04-21 NOTE — Telephone Encounter (Addendum)
Pt requesting Restoril rx refill.  Please advise.  Pt called again about refill.  Please advise to Triage Pool,

## 2014-04-22 ENCOUNTER — Other Ambulatory Visit: Payer: Self-pay | Admitting: Internal Medicine

## 2014-04-24 ENCOUNTER — Other Ambulatory Visit: Payer: Self-pay | Admitting: Internal Medicine

## 2014-04-24 ENCOUNTER — Other Ambulatory Visit: Payer: Self-pay | Admitting: Licensed Clinical Social Worker

## 2014-04-24 ENCOUNTER — Other Ambulatory Visit: Payer: Self-pay | Admitting: *Deleted

## 2014-04-24 DIAGNOSIS — G4700 Insomnia, unspecified: Secondary | ICD-10-CM

## 2014-04-24 MED ORDER — TEMAZEPAM 15 MG PO CAPS: 30.0000 mg | ORAL_CAPSULE | Freq: Every evening | ORAL | Status: DC | PRN

## 2014-04-24 NOTE — Telephone Encounter (Signed)
Please give him one refill of his Restoril. Also please let him know that we will discuss his primary care needs at his upcoming visit.

## 2014-04-25 ENCOUNTER — Other Ambulatory Visit: Payer: PRIVATE HEALTH INSURANCE

## 2014-04-25 ENCOUNTER — Ambulatory Visit: Payer: PRIVATE HEALTH INSURANCE | Admitting: Hematology and Oncology

## 2014-05-01 ENCOUNTER — Ambulatory Visit: Payer: PRIVATE HEALTH INSURANCE | Admitting: Internal Medicine

## 2014-05-06 ENCOUNTER — Encounter: Payer: Self-pay | Admitting: Internal Medicine

## 2014-05-06 ENCOUNTER — Ambulatory Visit (INDEPENDENT_AMBULATORY_CARE_PROVIDER_SITE_OTHER): Payer: PRIVATE HEALTH INSURANCE | Admitting: Internal Medicine

## 2014-05-06 VITALS — BP 126/86 | HR 96 | Temp 98.5°F | Wt 159.5 lb

## 2014-05-06 DIAGNOSIS — B20 Human immunodeficiency virus [HIV] disease: Secondary | ICD-10-CM

## 2014-05-06 NOTE — Progress Notes (Signed)
Patient ID: Christopher [name], male   DOB: [birth year], 63 y.o.   MRN: [id number]          Patient Active Problem List   Diagnosis Date Noted  . Peripheral neuropathy 09/30/2013    Priority: High  . HIV DISEASE 12/01/2006    Priority: High  . Depression 10/01/2013    Priority: Medium  . Renal insufficiency 08/19/2013  . Hypothyroidism 11/01/2012  . 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 Hyperglycemia 12/08/2011  . HYPOGONADISM 12/23/2009  . Oral cancer 08/18/2008  . Dyslipidemia 01/03/2008  . SYPHILIS, [place], LATENT NOS 01/18/2007  . HYPERTENSION 12/01/2006  . PROTEINURIA 12/01/2006    Patient's Medications  New Prescriptions   No medications on file  Previous Medications   ABACAVIR-DOLUTEGRAVIR-LAMIVUD 600-50-300 MG TABS    Take 1 tablet by mouth daily.   BENAZEPRIL-HYDROCHLORTHIAZIDE (LOTENSIN HCT) 10-12.5 MG PER TABLET    TAKE 1 TABLET BY MOUTH EVERY DAY   CHLORHEXIDINE (PERIDEX) 0.12 % SOLUTION    Use as directed 15 mLs in the mouth or throat 2 (two) times daily.   CLONAZEPAM (KLONOPIN) 1 MG TABLET    TAKE 1 TABLET 3 TIMES A DAY AS NEEDED FOR ANXIETY   DRONABINOL (MARINOL) 5 MG CAPSULE    Take 1 capsule (5 mg total) by mouth 2 (two) times daily before a meal.   FLUOXETINE (PROZAC) 20 MG CAPSULE    Take 1 capsule (20 mg total) by mouth daily.   LEVOTHYROXINE (SYNTHROID, LEVOTHROID) 50 MCG TABLET    Take 1 tablet (50 mcg total) by mouth daily before breakfast.   MULTIPLE VITAMIN (MULTIVITAMIN) TABLET    Take 1 tablet by mouth daily.   PILOCARPINE (SALAGEN) 5 MG TABLET    TAKE 1 TABLET (5 MG TOTAL) BY MOUTH 3 (THREE) TIMES DAILY.   TEMAZEPAM (RESTORIL) 15 MG CAPSULE    Take 2 capsules (30 mg total) by mouth at bedtime as needed for sleep.   TRAMADOL (ULTRAM) 50 MG TABLET    Take 1 tablet (50 mg total) by mouth every 6 (six) hours as needed.  Modified Medications   No medications on file  Discontinued Medications   No medications on file    Subjective: Christopher [name] is in for his  routine visit. He does not believe he is missed any doses of his Triumeq. He states that he does feel better since restarting Prozac. He is not feeling as depressed and he has also noted improvement in his neuropathic leg pain.  Review of Systems: Pertinent items are noted in HPI.  Past Medical History  Diagnosis Date  . Tonsillar cancer     tonsillar ca   . Hypertension   . HIV DISEASE 12/01/2006  . SYPHILIS, [place], LATENT NOS 01/18/2007  . HYPOGONADISM 12/23/2009  . HYPERLIPIDEMIA, WITH LOW HDL 01/03/2008  . HYPERTENSION 12/01/2006    History  Substance Use Topics  . Smoking status: Never Smoker   . Smokeless tobacco: Never Used  . Alcohol Use: No     Comment: social    No family history on file.  Allergies  Allergen Reactions  . Codeine   . Sulfamethoxazole-Trimethoprim   . Sulfonamide Derivatives     Objective: Temp: 98.5 F (36.9 C) (06/16 1435) Temp src: Oral (06/16 1435) BP: 126/86 mmHg (06/16 1435) Pulse Rate: 96 (06/16 1435) Body mass index is 22.89 kg/(m^2).  General: He is very talkative and in no distress Oral: No oropharyngeal lesions Skin: No rash Lungs: Clear Cor: Regular S1 and S2 with  no murmurs Mood and affect: Normal and appropriate  Lab Results Lab Results  Component Value Date   WBC 6.9 04/16/2014   HGB 13.4 04/16/2014   HCT 37.7* 04/16/2014   MCV 96.2 04/16/2014   PLT 219 04/16/2014    Lab Results  Component Value Date   CREATININE 1.84* 04/16/2014   BUN 18 04/16/2014   NA 140 04/16/2014   K 4.2 04/16/2014   CL 100 04/16/2014   CO2 31 04/16/2014    Lab Results  Component Value Date   ALT 17 04/16/2014   AST 15 04/16/2014   ALKPHOS 73 04/16/2014   BILITOT 0.5 04/16/2014    Lab Results  Component Value Date   CHOL 182 04/16/2014   HDL 42 04/16/2014   LDLCALC 76 04/16/2014   TRIG 322* 04/16/2014   CHOLHDL 4.3 04/16/2014    Lab Results HIV 1 RNA Quant (copies/mL)  Date Value  04/16/2014 <20   08/06/2013 <20   04/19/2013 <20      CD4 T  Cell Abs (/uL)  Date Value  04/16/2014 360*  08/06/2013 290*  04/19/2013 320*     Assessment: His HIV infection remains under excellent control. I will continue Triumeq. He's had a slight bump in his creatinine it could be related to the Cobicistat component of Triumeq. I will repeat his creatinine with his next set of blood work.  I encouraged him to followup with Dr. Delfina Redwood for his primary care needs.  Plan: 1. Continue Triumeq 2. Followup after lab work in [place] months   Michel Bickers, MD [facility] for [place] [phone] pager   [phone] cell 05/06/2014, 3:19 PM

## 2014-05-13 ENCOUNTER — Other Ambulatory Visit: Payer: Self-pay | Admitting: Internal Medicine

## 2014-05-14 ENCOUNTER — Other Ambulatory Visit: Payer: Self-pay | Admitting: *Deleted

## 2014-05-14 MED ORDER — TRAMADOL HCL 50 MG PO TABS: ORAL_TABLET | ORAL | Status: DC

## 2014-05-20 ENCOUNTER — Other Ambulatory Visit: Payer: Self-pay | Admitting: Hematology and Oncology

## 2014-05-30 ENCOUNTER — Other Ambulatory Visit: Payer: Self-pay | Admitting: Internal Medicine

## 2014-06-04 ENCOUNTER — Other Ambulatory Visit: Payer: Self-pay | Admitting: Hematology and Oncology

## 2014-06-04 ENCOUNTER — Telehealth: Payer: Self-pay | Admitting: *Deleted

## 2014-06-04 ENCOUNTER — Telehealth: Payer: Self-pay | Admitting: Hematology and Oncology

## 2014-06-04 DIAGNOSIS — E038 Other specified hypothyroidism: Secondary | ICD-10-CM

## 2014-06-04 DIAGNOSIS — C069 Malignant neoplasm of mouth, unspecified: Secondary | ICD-10-CM

## 2014-06-04 MED ORDER — LEVOTHYROXINE SODIUM 50 MCG PO TABS: 50.0000 ug | ORAL_TABLET | Freq: Every day | ORAL | Status: DC

## 2014-06-04 NOTE — Telephone Encounter (Signed)
pt called for appt. r/s 6/5 appt to 7/31. pt has new d/t.

## 2014-06-04 NOTE — Telephone Encounter (Signed)
Informed pt of no need to f/u w/ Dr. Alvy Bimler.  Continue to see ENT regularly and have PCP refill thyroid medication.  Informed Dr. Alvy Bimler sent refill for one more month thyroid medication.  Pt verbalized understanding.

## 2014-06-04 NOTE — Telephone Encounter (Signed)
Pt r/s his missed office visit from June to July 31 st.  Requests one more refill on Synthroid to [facility] on [address].  He only has about one week left of medication.

## 2014-06-04 NOTE — Telephone Encounter (Signed)
He does not need to return as he is more than 3 years out. Typically patient like him follow-up with ENT only and PCP for thyroid medications I have renewed his medicine 1 more month

## 2014-06-20 ENCOUNTER — Other Ambulatory Visit: Payer: PRIVATE HEALTH INSURANCE

## 2014-06-20 ENCOUNTER — Ambulatory Visit: Payer: PRIVATE HEALTH INSURANCE | Admitting: Hematology and Oncology

## 2014-07-14 ENCOUNTER — Other Ambulatory Visit: Payer: Self-pay | Admitting: Dermatology

## 2014-07-24 ENCOUNTER — Ambulatory Visit: Payer: PRIVATE HEALTH INSURANCE

## 2014-07-29 ENCOUNTER — Ambulatory Visit: Payer: PRIVATE HEALTH INSURANCE

## 2014-08-23 ENCOUNTER — Other Ambulatory Visit: Payer: Self-pay | Admitting: Internal Medicine

## 2014-09-15 ENCOUNTER — Other Ambulatory Visit: Payer: Self-pay | Admitting: Internal Medicine

## 2014-09-18 ENCOUNTER — Other Ambulatory Visit: Payer: Self-pay | Admitting: Internal Medicine

## 2014-09-19 ENCOUNTER — Other Ambulatory Visit: Payer: Self-pay | Admitting: *Deleted

## 2014-09-19 DIAGNOSIS — F329 Major depressive disorder, single episode, unspecified: Secondary | ICD-10-CM

## 2014-09-19 DIAGNOSIS — F32A Depression, unspecified: Secondary | ICD-10-CM

## 2014-09-19 MED ORDER — FLUOXETINE HCL 20 MG PO CAPS: 20.0000 mg | ORAL_CAPSULE | Freq: Every day | ORAL | Status: DC

## 2014-10-22 ENCOUNTER — Other Ambulatory Visit: Payer: Self-pay | Admitting: Dermatology

## 2014-10-23 ENCOUNTER — Other Ambulatory Visit: Payer: PRIVATE HEALTH INSURANCE

## 2014-10-23 DIAGNOSIS — B20 Human immunodeficiency virus [HIV] disease: Secondary | ICD-10-CM

## 2014-10-23 DIAGNOSIS — Z79899 Other long term (current) drug therapy: Secondary | ICD-10-CM

## 2014-10-23 LAB — COMPREHENSIVE METABOLIC PANEL
ALK PHOS: 61 U/L (ref 39–117)
ALT: 22 U/L (ref 0–53)
AST: 19 U/L (ref 0–37)
Albumin: 3.9 g/dL (ref 3.5–5.2)
BILIRUBIN TOTAL: 0.4 mg/dL (ref 0.2–1.2)
BUN: 19 mg/dL (ref 6–23)
CO2: 27 mEq/L (ref 19–32)
CREATININE: 1.48 mg/dL — AB (ref 0.50–1.35)
Calcium: 9.3 mg/dL (ref 8.4–10.5)
Chloride: 103 mEq/L (ref 96–112)
Glucose, Bld: 113 mg/dL — ABNORMAL HIGH (ref 70–99)
Potassium: 4 mEq/L (ref 3.5–5.3)
Sodium: 140 mEq/L (ref 135–145)
Total Protein: 6.9 g/dL (ref 6.0–8.3)

## 2014-10-23 LAB — CBC
HCT: 38 % — ABNORMAL LOW (ref 39.0–52.0)
Hemoglobin: 13.2 g/dL (ref 13.0–17.0)
MCH: 34.1 pg — AB (ref 26.0–34.0)
MCHC: 34.7 g/dL (ref 30.0–36.0)
MCV: 98.2 fL (ref 78.0–100.0)
MPV: 9.2 fL — ABNORMAL LOW (ref 9.4–12.4)
PLATELETS: 223 10*3/uL (ref 150–400)
RBC: 3.87 MIL/uL — ABNORMAL LOW (ref 4.22–5.81)
RDW: 13 % (ref 11.5–15.5)
WBC: 4.8 10*3/uL (ref 4.0–10.5)

## 2014-10-23 LAB — LIPID PANEL
Cholesterol: 168 mg/dL (ref 0–200)
HDL: 42 mg/dL (ref >39)
Total CHOL/HDL Ratio: 4 Ratio
Triglycerides: 657 mg/dL — ABNORMAL HIGH (ref <150)

## 2014-10-24 LAB — HIV-1 RNA QUANT-NO REFLEX-BLD
HIV 1 RNA Quant: <20 copies/mL (ref <20)
HIV-1 RNA Quant, Log: <1.3 {Log} (ref <1.30)

## 2014-10-24 LAB — T-HELPER CELL (CD4) - (RCID CLINIC ONLY)
CD4 T CELL HELPER: 25 % — AB (ref 33–55)
CD4 T Cell Abs: 310 /uL — ABNORMAL LOW (ref 400–2700)

## 2014-11-06 ENCOUNTER — Ambulatory Visit: Payer: PRIVATE HEALTH INSURANCE | Admitting: Internal Medicine

## 2014-11-11 ENCOUNTER — Ambulatory Visit (INDEPENDENT_AMBULATORY_CARE_PROVIDER_SITE_OTHER): Payer: PRIVATE HEALTH INSURANCE | Admitting: Internal Medicine

## 2014-11-11 ENCOUNTER — Encounter: Payer: Self-pay | Admitting: Internal Medicine

## 2014-11-11 DIAGNOSIS — E785 Hyperlipidemia, unspecified: Secondary | ICD-10-CM

## 2014-11-11 MED ORDER — FENOFIBRATE 145 MG PO TABS: 145.0000 mg | ORAL_TABLET | Freq: Every day | ORAL | Status: DC

## 2014-11-11 NOTE — Progress Notes (Signed)
Patient ID: Christopher [name], male   DOB: 30-Oct-1951, 63 y.o.   MRN: [id number]          Patient Active Problem List   Diagnosis Date Noted  . Peripheral neuropathy 09/30/2013    Priority: High  . HIV DISEASE 12/01/2006    Priority: High  . Depression 10/01/2013    Priority: Medium  . Renal insufficiency 08/19/2013  . Hypothyroidism 11/01/2012  . 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 Hyperglycemia 12/08/2011  . HYPOGONADISM 12/23/2009  . Oral cancer 08/18/2008  . Dyslipidemia 01/03/2008  . SYPHILIS, [place], LATENT NOS 01/18/2007  . HYPERTENSION 12/01/2006  . PROTEINURIA 12/01/2006    Patient's Medications  New Prescriptions   FENOFIBRATE (TRICOR) 145 MG TABLET    Take 1 tablet (145 mg total) by mouth daily.  Previous Medications   ABACAVIR-DOLUTEGRAVIR-LAMIVUD 600-50-300 MG TABS    Take 1 tablet by mouth daily.   BENAZEPRIL-HYDROCHLORTHIAZIDE (LOTENSIN HCT) 10-12.5 MG PER TABLET    TAKE 1 TABLET BY MOUTH EVERY DAY   CHLORHEXIDINE (PERIDEX) 0.12 % SOLUTION    Use as directed 15 mLs in the mouth or throat 2 (two) times daily.   CLONAZEPAM (KLONOPIN) 1 MG TABLET    TAKE 1 TABLET 3 TIMES A DAY AS NEEDED FOR ANXIETY   DRONABINOL (MARINOL) 5 MG CAPSULE    Take 1 capsule (5 mg total) by mouth 2 (two) times daily before a meal.   FLUOXETINE (PROZAC) 20 MG CAPSULE    Take 1 capsule (20 mg total) by mouth daily.   LEVOTHYROXINE (SYNTHROID, LEVOTHROID) 50 MCG TABLET    Take 1 tablet (50 mcg total) by mouth daily before breakfast.   MULTIPLE VITAMIN (MULTIVITAMIN) TABLET    Take 1 tablet by mouth daily.   PILOCARPINE (SALAGEN) 5 MG TABLET    TAKE 1 TABLET (5 MG TOTAL) BY MOUTH 3 (THREE) TIMES DAILY.   TEMAZEPAM (RESTORIL) 15 MG CAPSULE    Take 2 capsules (30 mg total) by mouth at bedtime as needed for sleep.   TRAMADOL (ULTRAM) 50 MG TABLET    TAKE 1 TABLET BY MOUTH EVERY 6 HOURS AS NEEDED  Modified Medications   No medications on file  Discontinued Medications   No medications on  file    Subjective: Christopher [name] is in for his routine visit. As usual he never misses a single dose of his Triumeq. He states that he's feeling well. He has noted dramatic improvement in the burning pain in his feet since starting Prozac. He thinks it may have helped his mood as well.  Review of Systems: Constitutional: negative Eyes: negative Ears, nose, mouth, throat, and face: negative Respiratory: negative Cardiovascular: negative Gastrointestinal: negative Genitourinary:negative  Past Medical History  Diagnosis Date  . Tonsillar cancer     tonsillar ca   . Hypertension   . HIV DISEASE 12/01/2006  . SYPHILIS, [place], LATENT NOS 01/18/2007  . HYPOGONADISM 12/23/2009  . HYPERLIPIDEMIA, WITH LOW HDL 01/03/2008  . HYPERTENSION 12/01/2006    History  Substance Use Topics  . Smoking status: Never Smoker   . Smokeless tobacco: Never Used  . Alcohol Use: 0.0 oz/week    0 Not specified per week     Comment: social    No family history on file.  Allergies  Allergen Reactions  . Codeine   . Sulfamethoxazole-Trimethoprim   . Sulfonamide Derivatives     Objective: Temp: 97.9 F (36.6 C) (12/22 1033) Temp Source: Oral (12/22 1033) BP: 119/82 mmHg (12/22 1033) Pulse Rate: 113 (  12/22 1033) Body mass index is 22.67 kg/(m^2).  General: He is in good spirits Oral: No oropharyngeal lesions Skin: No rash Lungs: Clear Cor: Regular S1 and S2 with no murmurs    Lab Results Lab Results  Component Value Date   WBC 4.8 10/23/2014   HGB 13.2 10/23/2014   HCT 38.0* 10/23/2014   MCV 98.2 10/23/2014   PLT 223 10/23/2014    Lab Results  Component Value Date   CREATININE 1.48* 10/23/2014   BUN 19 10/23/2014   NA 140 10/23/2014   K 4.0 10/23/2014   CL 103 10/23/2014   CO2 27 10/23/2014    Lab Results  Component Value Date   ALT 22 10/23/2014   AST 19 10/23/2014   ALKPHOS 61 10/23/2014   BILITOT 0.4 10/23/2014    Lab Results  Component Value Date   CHOL 168 10/23/2014   HDL  42 10/23/2014   LDLCALC NOT CALC 10/23/2014   TRIG 657* 10/23/2014   CHOLHDL 4.0 10/23/2014    Lab Results HIV 1 RNA QUANT (copies/mL)  Date Value  10/23/2014 <20  04/16/2014 <20  08/06/2013 <20   CD4 T CELL ABS (/uL)  Date Value  10/23/2014 310*  04/16/2014 360*  08/06/2013 290*     Assessment: His HIV infection remains under excellent control. I will continue Triumeq.  His peripheral neuropathy has improved with Prozac.  His hypertriglyceridemia has worsened. I will start him on fenofibrate.  Plan: 1. Continue Triumeq 2. Continue Prozac 3. Start fenofibrate 4. Follow-up after lab work in [place] months   Michel Bickers, MD [facility] for [place] [phone] pager   [phone] cell 11/11/2014, 11:01 AM

## 2014-12-07 ENCOUNTER — Other Ambulatory Visit: Payer: Self-pay | Admitting: Internal Medicine

## 2015-01-04 ENCOUNTER — Other Ambulatory Visit: Payer: Self-pay | Admitting: Internal Medicine

## 2015-01-05 ENCOUNTER — Other Ambulatory Visit: Payer: Self-pay | Admitting: *Deleted

## 2015-01-05 DIAGNOSIS — B20 Human immunodeficiency virus [HIV] disease: Secondary | ICD-10-CM

## 2015-01-05 MED ORDER — ABACAVIR-DOLUTEGRAVIR-LAMIVUD 600-50-300 MG PO TABS: 1.0000 | ORAL_TABLET | Freq: Every day | ORAL | Status: DC

## 2015-03-03 ENCOUNTER — Other Ambulatory Visit: Payer: Self-pay | Admitting: Internal Medicine

## 2015-04-12 ENCOUNTER — Other Ambulatory Visit: Payer: Self-pay | Admitting: Internal Medicine

## 2015-04-13 ENCOUNTER — Telehealth: Payer: Self-pay | Admitting: *Deleted

## 2015-04-13 NOTE — Telephone Encounter (Signed)
Yes, it is okay to give him a handicap parking card.

## 2015-04-13 NOTE — Telephone Encounter (Signed)
Form filled out and put in Dr. Hale Bogus box for signature.

## 2015-04-13 NOTE — Telephone Encounter (Signed)
Patient called requesting a handicap form to be filled out for parking. It was previously done by Dr. Orene Desanctis and he said it is because of peripheral neuropathy which is directly related to his HIV. We do have the forms here; please advise if ok. Christopher [name]

## 2015-04-27 ENCOUNTER — Other Ambulatory Visit: Payer: PRIVATE HEALTH INSURANCE

## 2015-04-29 ENCOUNTER — Other Ambulatory Visit: Payer: PRIVATE HEALTH INSURANCE

## 2015-04-30 ENCOUNTER — Other Ambulatory Visit: Payer: PRIVATE HEALTH INSURANCE

## 2015-04-30 DIAGNOSIS — E785 Hyperlipidemia, unspecified: Secondary | ICD-10-CM

## 2015-04-30 LAB — CBC
HEMATOCRIT: 34.6 % — AB (ref 39.0–52.0)
Hemoglobin: 11.8 g/dL — ABNORMAL LOW (ref 13.0–17.0)
MCH: 33.1 pg (ref 26.0–34.0)
MCHC: 34.1 g/dL (ref 30.0–36.0)
MCV: 97.2 fL (ref 78.0–100.0)
MPV: 8.6 fL (ref 8.6–12.4)
PLATELETS: 224 10*3/uL (ref 150–400)
RBC: 3.56 MIL/uL — AB (ref 4.22–5.81)
RDW: 14.3 % (ref 11.5–15.5)
WBC: 3.4 10*3/uL — AB (ref 4.0–10.5)

## 2015-04-30 LAB — LIPID PANEL
Cholesterol: 194 mg/dL (ref 0–200)
HDL: 37 mg/dL — ABNORMAL LOW (ref >=40)
TRIGLYCERIDES: 479 mg/dL — AB (ref <150)
Total CHOL/HDL Ratio: 5.2 Ratio

## 2015-04-30 LAB — COMPREHENSIVE METABOLIC PANEL
ALT: 12 U/L (ref 0–53)
AST: 17 U/L (ref 0–37)
Albumin: 4 g/dL (ref 3.5–5.2)
Alkaline Phosphatase: 33 U/L — ABNORMAL LOW (ref 39–117)
BILIRUBIN TOTAL: 0.5 mg/dL (ref 0.2–1.2)
BUN: 24 mg/dL — ABNORMAL HIGH (ref 6–23)
CO2: 28 mEq/L (ref 19–32)
CREATININE: 1.95 mg/dL — AB (ref 0.50–1.35)
Calcium: 9.8 mg/dL (ref 8.4–10.5)
Chloride: 103 mEq/L (ref 96–112)
Glucose, Bld: 104 mg/dL — ABNORMAL HIGH (ref 70–99)
POTASSIUM: 4.3 meq/L (ref 3.5–5.3)
Sodium: 140 mEq/L (ref 135–145)
Total Protein: 7.3 g/dL (ref 6.0–8.3)

## 2015-04-30 LAB — RPR

## 2015-05-01 LAB — HIV-1 RNA QUANT-NO REFLEX-BLD
HIV 1 RNA Quant: <20 copies/mL (ref <20)
HIV-1 RNA Quant, Log: <1.3 {Log} (ref <1.30)

## 2015-05-01 LAB — T-HELPER CELL (CD4) - (RCID CLINIC ONLY)
CD4 T CELL HELPER: 26 % — AB (ref 33–55)
CD4 T Cell Abs: 244 /uL — ABNORMAL LOW (ref 400–2700)

## 2015-05-12 ENCOUNTER — Ambulatory Visit: Payer: PRIVATE HEALTH INSURANCE | Admitting: Internal Medicine

## 2015-05-18 ENCOUNTER — Ambulatory Visit (INDEPENDENT_AMBULATORY_CARE_PROVIDER_SITE_OTHER): Payer: PRIVATE HEALTH INSURANCE | Admitting: Internal Medicine

## 2015-05-18 ENCOUNTER — Encounter: Payer: Self-pay | Admitting: Internal Medicine

## 2015-05-18 DIAGNOSIS — B20 Human immunodeficiency virus [HIV] disease: Secondary | ICD-10-CM | POA: Diagnosis not present

## 2015-05-18 NOTE — Progress Notes (Signed)
Patient ID: Christopher [name], male   DOB: 1950-11-23, 64 y.o.   MRN: [id number]          Patient Active Problem List   Diagnosis Date Noted  . Peripheral neuropathy 09/30/2013    Priority: High  . Human immunodeficiency virus (HIV) disease 12/01/2006    Priority: High  . Depression 10/01/2013    Priority: Medium  . Renal insufficiency 08/19/2013  . Hypothyroidism 11/01/2012  . 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 Hyperglycemia 12/08/2011  . HYPOGONADISM 12/23/2009  . Oral cancer 08/18/2008  . Dyslipidemia 01/03/2008  . SYPHILIS, [place], LATENT NOS 01/18/2007  . HYPERTENSION 12/01/2006  . PROTEINURIA 12/01/2006    Patient's Medications  New Prescriptions   No medications on file  Previous Medications   ABACAVIR-DOLUTEGRAVIR-LAMIVUD 600-50-300 MG TABS    Take 1 tablet by mouth daily.   BENAZEPRIL-HYDROCHLORTHIAZIDE (LOTENSIN HCT) 10-12.5 MG PER TABLET    TAKE 1 TABLET BY MOUTH EVERY DAY   CHLORHEXIDINE (PERIDEX) 0.12 % SOLUTION    Use as directed 15 mLs in the mouth or throat 2 (two) times daily.   CLONAZEPAM (KLONOPIN) 1 MG TABLET    TAKE 1 TABLET 3 TIMES A DAY AS NEEDED FOR ANXIETY   DRONABINOL (MARINOL) 5 MG CAPSULE    Take 1 capsule (5 mg total) by mouth 2 (two) times daily before a meal.   FENOFIBRATE (TRICOR) 145 MG TABLET    Take 1 tablet (145 mg total) by mouth daily.   FLUOXETINE (PROZAC) 20 MG CAPSULE    Take 1 capsule (20 mg total) by mouth daily.   LEVOTHYROXINE (SYNTHROID, LEVOTHROID) 50 MCG TABLET    Take 1 tablet (50 mcg total) by mouth daily before breakfast.   MULTIPLE VITAMIN (MULTIVITAMIN) TABLET    Take 1 tablet by mouth daily.   PILOCARPINE (SALAGEN) 5 MG TABLET    TAKE 1 TABLET (5 MG TOTAL) BY MOUTH 3 (THREE) TIMES DAILY.   TEMAZEPAM (RESTORIL) 15 MG CAPSULE    Take 2 capsules (30 mg total) by mouth at bedtime as needed for sleep.   TRAMADOL (ULTRAM) 50 MG TABLET    TAKE 1 TABLET BY MOUTH EVERY 6 HOURS AS NEEDED  Modified Medications   No medications on  file  Discontinued Medications   No medications on file    Subjective: Christopher [name] is in for his routine visit. As usual he never misses a single dose of his Triumeq. He states that he's feeling well. He has noted dramatic improvement in the burning pain in his feet since starting Prozac.   Review of Systems: Constitutional: negative Eyes: negative Ears, nose, mouth, throat, and face: negative Respiratory: negative Cardiovascular: negative Gastrointestinal: negative Genitourinary:negative  Past Medical History  Diagnosis Date  . Tonsillar cancer     tonsillar ca   . Hypertension   . HIV DISEASE 12/01/2006  . SYPHILIS, [place], LATENT NOS 01/18/2007  . HYPOGONADISM 12/23/2009  . HYPERLIPIDEMIA, WITH LOW HDL 01/03/2008  . HYPERTENSION 12/01/2006    History  Substance Use Topics  . Smoking status: Never Smoker   . Smokeless tobacco: Never Used  . Alcohol Use: 0.0 oz/week    0 Standard drinks or equivalent per week     Comment: social    No family history on file.  Allergies  Allergen Reactions  . Codeine   . Sulfamethoxazole-Trimethoprim   . Sulfonamide Derivatives     Objective: Temp: 97.6 F (36.4 C) (06/27 1452) Temp Source: Oral (06/27 1452) BP: 125/76 mmHg (06/27 1452) Pulse  Rate: 83 (06/27 1452) Body mass index is 22.42 kg/(m^2).  General: He is in very good spirits Oral: No lesions Skin: No rash Lungs: Clear Cor: Regular S1 and S2 with no murmurs    Lab Results Lab Results  Component Value Date   WBC 3.4* 04/30/2015   HGB 11.8* 04/30/2015   HCT 34.6* 04/30/2015   MCV 97.2 04/30/2015   PLT 224 04/30/2015    Lab Results  Component Value Date   CREATININE 1.95* 04/30/2015   BUN 24* 04/30/2015   NA 140 04/30/2015   K 4.3 04/30/2015   CL 103 04/30/2015   CO2 28 04/30/2015    Lab Results  Component Value Date   ALT 12 04/30/2015   AST 17 04/30/2015   ALKPHOS 33* 04/30/2015   BILITOT 0.5 04/30/2015    Lab Results  Component Value Date   CHOL 194  04/30/2015   HDL 37* 04/30/2015   LDLCALC NOT CALC 04/30/2015   TRIG 479* 04/30/2015   CHOLHDL 5.2 04/30/2015    Lab Results HIV 1 RNA QUANT (copies/mL)  Date Value  04/30/2015 <20  10/23/2014 <20  04/16/2014 <20   CD4 T CELL ABS (/uL)  Date Value  04/30/2015 244*  10/23/2014 310*  04/16/2014 360*     Assessment: His HIV infection remains under excellent control. I will continue Triumeq.  His hypertriglyceridemia has improved only slightly after the addition of fenofibrate at the time of his last visit.  Plan: 1. Continue Triumeq 2. Continue Prozac 3. Continue fenofibrate. He is due to follow up with his primary care physician, Dr. Delfina Redwood in several weeks. 4. Follow-up after lab work in [place] months   Michel Bickers, MD [facility] for [place] [phone] pager   [phone] cell 05/18/2015, 3:20 PM

## 2015-05-24 ENCOUNTER — Other Ambulatory Visit: Payer: Self-pay | Admitting: Internal Medicine

## 2015-06-10 ENCOUNTER — Telehealth: Payer: Self-pay | Admitting: *Deleted

## 2015-06-10 NOTE — Telephone Encounter (Signed)
Patient reports recent bloodwork from Dr. Lina Sar office shows Chronic Kidney Disease.  He would like Dr. Megan Salon to review the results and see if this will need to change any of his current medications. RN requested the labs from [facility] to be faxed to [phone]. Landis Gandy, RN

## 2015-06-11 NOTE — Telephone Encounter (Signed)
Relayed information to patient. He verbalized understanding and thanked you for reviewing them!

## 2015-06-11 NOTE — Telephone Encounter (Signed)
Please let Christopher [name] no that he has had some mild elevation of his creatinine going back as far as 2008. The creatinine was done in Dr. Lina Sar office recently was 1.79. This is in line with previous values. This is not being caused by his HIV medication and I do not feel that he needs to change the dose of any of his medications.

## 2015-06-21 ENCOUNTER — Other Ambulatory Visit: Payer: Self-pay | Admitting: Internal Medicine

## 2015-07-12 ENCOUNTER — Encounter (HOSPITAL_COMMUNITY): Payer: Self-pay | Admitting: *Deleted

## 2015-07-12 ENCOUNTER — Emergency Department (INDEPENDENT_AMBULATORY_CARE_PROVIDER_SITE_OTHER)
Admission: EM | Admit: 2015-07-12 | Discharge: 2015-07-12 | Disposition: A | Discharge status: Home / Self Care | Payer: PRIVATE HEALTH INSURANCE | Source: Home / Self Care | Attending: Emergency Medicine | Admitting: Emergency Medicine

## 2015-07-12 DIAGNOSIS — S01112A Laceration without foreign body of left eyelid and periocular area, initial encounter: Secondary | ICD-10-CM | POA: Diagnosis not present

## 2015-07-12 DIAGNOSIS — S0990XA Unspecified injury of head, initial encounter: Secondary | ICD-10-CM

## 2015-07-12 MED ORDER — CEPHALEXIN 500 MG PO CAPS: ORAL_CAPSULE | ORAL | Status: DC

## 2015-07-12 NOTE — ED Provider Notes (Signed)
CSN: [id number]     Arrival date & time 07/12/15  1937 History   First MD Initiated Contact with Patient 07/12/15 2047     Chief Complaint  Patient presents with  . Laceration   (Consider location/radiation/quality/duration/timing/severity/associated sxs/prior Treatment) HPI Comments: 64 year old male was working in his garden last night approximately 13 hours ago from now and he tripped and fell striking his head against a flowerpot. This produced day presented with a laceration over the left eyebrow. He decided that he did not want to have it treated or weight in the emergency department so he applied to butterfly closure strips. He was seen by nurse at [facility] and told to have that laceration checked out. Patient states that he cleaned the laceration couple times last night and today. He denies any symptoms from head injury. No loss of consciousness, no problems with vision, speech, hearing, swallowing, memory, headache, cognitive abilities, ambulation, coordination or other problems. His been fully awake and oriented  Patient is a 64 y.o. male presenting with skin laceration.  Laceration   Past Medical History  Diagnosis Date  . Tonsillar cancer     tonsillar ca   . Hypertension   . HIV DISEASE 12/01/2006  . SYPHILIS, [place], LATENT NOS 01/18/2007  . HYPOGONADISM 12/23/2009  . HYPERLIPIDEMIA, WITH LOW HDL 01/03/2008  . HYPERTENSION 12/01/2006   History reviewed. No pertinent past surgical history. History reviewed. No pertinent family history. Social History  Substance Use Topics  . Smoking status: Never Smoker   . Smokeless tobacco: Never Used  . Alcohol Use: 0.0 oz/week    0 Standard drinks or equivalent per week     Comment: social    Review of Systems  Constitutional: Negative.   HENT: Negative.   Eyes: Negative for photophobia, discharge, redness, itching and visual disturbance.  Cardiovascular: Negative.   Gastrointestinal: Negative.   Musculoskeletal: Negative.    Skin: Positive for wound.  Neurological: Negative for dizziness, tremors, seizures, syncope, facial asymmetry, speech difficulty, weakness, light-headedness, numbness and headaches.  Hematological: Negative.   Psychiatric/Behavioral: Negative for behavioral problems and confusion.  All other systems reviewed and are negative.   Allergies  Codeine; Sulfamethoxazole-trimethoprim; and Sulfonamide derivatives  Home Medications   Prior to Admission medications   Medication Sig Start Date End Date Taking? Authorizing Provider  Abacavir-Dolutegravir-Lamivud 102-58-527 MG TABS Take 1 tablet by mouth daily. 01/05/15   Michel Bickers, MD  benazepril-hydrochlorthiazide (LOTENSIN HCT) 10-12.5 MG per tablet TAKE 1 TABLET BY MOUTH EVERY DAY 06/22/15   Michel Bickers, MD  cephALEXin (KEFLEX) 500 MG capsule 1 cap tid x 3 d 07/12/15   Janne Napoleon, NP  chlorhexidine (PERIDEX) 0.12 % solution Use as directed 15 mLs in the mouth or throat 2 (two) times daily.    Historical Provider, MD  clonazePAM (KLONOPIN) 1 MG tablet TAKE 1 TABLET 3 TIMES A DAY AS NEEDED FOR ANXIETY    Michel Bickers, MD  dronabinol (MARINOL) 5 MG capsule Take 1 capsule (5 mg total) by mouth 2 (two) times daily before a meal. 01/16/13   Lars Mage, MD  fenofibrate (TRICOR) 145 MG tablet Take 1 tablet (145 mg total) by mouth daily. 11/11/14   Michel Bickers, MD  FLUoxetine (PROZAC) 20 MG capsule Take 1 capsule (20 mg total) by mouth daily. 09/19/14   Michel Bickers, MD  levothyroxine (SYNTHROID, LEVOTHROID) 50 MCG tablet Take 1 tablet (50 mcg total) by mouth daily before breakfast. 06/04/14   Heath Lark, MD  Multiple Vitamin (MULTIVITAMIN) tablet Take  1 tablet by mouth daily.    Historical Provider, MD  pilocarpine (SALAGEN) 5 MG tablet TAKE 1 TABLET (5 MG TOTAL) BY MOUTH 3 (THREE) TIMES DAILY. 06/09/13   Nobie Putnam, MD  temazepam (RESTORIL) 15 MG capsule Take 2 capsules (30 mg total) by mouth at bedtime as needed for sleep. 04/24/14   Michel Bickers, MD   traMADol (ULTRAM) 50 MG tablet TAKE 1 TABLET BY MOUTH EVERY 6 HOURS AS NEEDED 05/14/14   Michel Bickers, MD   BP 189/102 mmHg  Pulse 74  Temp(Src) 98 F (36.7 C) (Oral)  Resp 18  SpO2 98% Physical Exam  Constitutional: He is oriented to person, place, and time. He appears well-developed and well-nourished. No distress.  Eyes: Conjunctivae and EOM are normal. Pupils are equal, round, and reactive to light.  The eye proper does not appear to be affected.  Neck: Normal range of motion. Neck supple.  Cardiovascular: Normal rate.   Pulmonary/Chest: Effort normal. No respiratory distress.  Musculoskeletal: Normal range of motion. He exhibits no edema.  Neurological: He is alert and oriented to person, place, and time. He has normal strength. No cranial nerve deficit or sensory deficit. He exhibits normal muscle tone. Coordination normal. GCS eye subscore is 4. GCS verbal subscore is 5. GCS motor subscore is 6.  Skin: Skin is warm and dry.  3 cm heart is on a laceration across the left eyebrow. When the edges are separated the depth involves the dermis and a portion of the subcutaneous layer. No current bleeding. No signs of infection.  Psychiatric: He has a normal mood and affect. His behavior is normal. Thought content normal.  Nursing note and vitals reviewed.   ED Course  LACERATION REPAIR Date/Time: 07/12/2015 9:14 PM Performed by: Marcha Dutton, Skyllar Notarianni Authorized by: Melony Overly Consent: Verbal consent obtained. Risks and benefits: risks, benefits and alternatives were discussed Consent given by: patient Patient understanding: patient states understanding of the procedure being performed Patient identity confirmed: verbally with patient Body area: head/neck Location details: left eyebrow Laceration length: 3 cm Foreign bodies: no foreign bodies Tendon involvement: none Nerve involvement: none Vascular damage: no Patient sedated: no Irrigation solution: saline Irrigation method: jet  lavage Amount of cleaning: standard Debridement: none Degree of undermining: none Skin closure: Steri-Strips Number of sutures: 4 Technique: simple Approximation: loose Approximation difficulty: simple Patient tolerance: Patient tolerated the procedure well with no immediate complications Comments: Applied steri strips loosely after copious jet lavage.   (including critical care time) Labs Review Labs Reviewed - No data to display  Imaging Review No results found.   MDM   1. Laceration of eyebrow, left, initial encounter   2. Head injury, initial encounter    Keep steri strips on for 5-7 days Keep dry Watch for infection, return for problems. Instructions for head injury sx's.    Janne Napoleon, NP 07/12/15 2116  Janne Napoleon, NP 07/12/15 2118

## 2015-07-12 NOTE — ED Notes (Signed)
Pt  Reports  He  Felled   Last  Pm   And  sustained  A   laceration  To  His  l  Upper  Eyebrow      He   denies  Any  Vomiting       He  Denied  Blacking  Out      He  States  He    Has  High  Blood  Pressure        And  He  States  He  Took his  meds  Today

## 2015-07-12 NOTE — Discharge Instructions (Signed)
Facial Laceration ° A facial laceration is a cut on the face. These injuries can be painful and cause bleeding. Lacerations usually heal quickly, but they need special care to reduce scarring. °DIAGNOSIS  °Your health care provider will take a medical history, ask for details about how the injury occurred, and examine the wound to determine how deep the cut is. °TREATMENT  °Some facial lacerations may not require closure. Others may not be able to be closed because of an increased risk of infection. The risk of infection and the chance for successful closure will depend on various factors, including the amount of time since the injury occurred. °The wound may be cleaned to help prevent infection. If closure is appropriate, pain medicines may be given if needed. Your health care provider will use stitches (sutures), wound glue (adhesive), or skin adhesive strips to repair the laceration. These tools bring the skin edges together to allow for faster healing and a better cosmetic outcome. If needed, you may also be given a tetanus shot. °HOME CARE INSTRUCTIONS °· Only take over-the-counter or prescription medicines as directed by your health care provider. °· Follow your health care provider's instructions for wound care. These instructions will vary depending on the technique used for closing the wound. °For Sutures: °· Keep the wound clean and dry.   °· If you were given a bandage (dressing), you should change it at least once a day. Also change the dressing if it becomes wet or dirty, or as directed by your health care provider.   °· Wash the wound with soap and water 2 times a day. Rinse the wound off with water to remove all soap. Pat the wound dry with a clean towel.   °· After cleaning, apply a thin layer of the antibiotic ointment recommended by your health care provider. This will help prevent infection and keep the dressing from sticking.   °· You may shower as usual after the first 24 hours. Do not soak the  wound in water until the sutures are removed.   °· Get your sutures removed as directed by your health care provider. With facial lacerations, sutures should usually be taken out after 4-5 days to avoid stitch marks.   °· Wait a few days after your sutures are removed before applying any makeup. °For Skin Adhesive Strips: °· Keep the wound clean and dry.   °· Do not get the skin adhesive strips wet. You may bathe carefully, using caution to keep the wound dry.   °· If the wound gets wet, pat it dry with a clean towel.   °· Skin adhesive strips will fall off on their own. You may trim the strips as the wound heals. Do not remove skin adhesive strips that are still stuck to the wound. They will fall off in time.   °For Wound Adhesive: °· You may briefly wet your wound in the shower or bath. Do not soak or scrub the wound. Do not swim. Avoid periods of heavy sweating until the skin adhesive has fallen off on its own. After showering or bathing, gently pat the wound dry with a clean towel.   °· Do not apply liquid medicine, cream medicine, ointment medicine, or makeup to your wound while the skin adhesive is in place. This may loosen the film before your wound is healed.   °· If a dressing is placed over the wound, be careful not to apply tape directly over the skin adhesive. This may cause the adhesive to be pulled off before the wound is healed.   °· Avoid   prolonged exposure to sunlight or tanning lamps while the skin adhesive is in place.  The skin adhesive will usually remain in place for 5-10 days, then naturally fall off the skin. Do not pick at the adhesive film.  After Healing: Once the wound has healed, cover the wound with sunscreen during the day for 1 full year. This can help minimize scarring. Exposure to ultraviolet light in the first year will darken the scar. It can take 1-2 years for the scar to lose its redness and to heal completely.  SEEK IMMEDIATE MEDICAL CARE IF:  You have redness, pain, or  swelling around the wound.   You see ayellowish-white fluid (pus) coming from the wound.   You have chills or a fever.  MAKE SURE YOU:  Understand these instructions.  Will watch your condition.  Will get help right away if you are not doing well or get worse. Document Released: 12/15/2004 Document Revised: 08/28/2013 Document Reviewed: 06/20/2013 [facility] Patient Information 2015 [place], [place]. This information is not intended to replace advice given to you by your health care provider. Make sure you discuss any questions you have with your health care provider.  Head Injury You have received a head injury. It does not appear serious at this time. Headaches and vomiting are common following head injury. It should be easy to awaken from sleeping. Sometimes it is necessary for you to stay in the emergency department for a while for observation. Sometimes admission to the hospital may be needed. After injuries such as yours, most problems occur within the first 24 hours, but side effects may occur up to 7-10 days after the injury. It is important for you to carefully monitor your condition and contact your health care provider or seek immediate medical care if there is a change in your condition. WHAT ARE THE TYPES OF HEAD INJURIES? Head injuries can be as minor as a bump. Some head injuries can be more severe. More severe head injuries include:  A jarring injury to the brain (concussion).  A bruise of the brain (contusion). This mean there is bleeding in the brain that can cause swelling.  A cracked skull (skull fracture).  Bleeding in the brain that collects, clots, and forms a bump (hematoma). WHAT CAUSES A HEAD INJURY? A serious head injury is most likely to happen to someone who is in a car wreck and is not wearing a seat belt. Other causes of major head injuries include bicycle or motorcycle accidents, sports injuries, and falls. HOW ARE HEAD INJURIES DIAGNOSED? A complete  history of the event leading to the injury and your current symptoms will be helpful in diagnosing head injuries. Many times, pictures of the brain, such as CT or MRI are needed to see the extent of the injury. Often, an overnight hospital stay is necessary for observation.  WHEN SHOULD I SEEK IMMEDIATE MEDICAL CARE?  You should get help right away if:  You have confusion or drowsiness.  You feel sick to your stomach (nauseous) or have continued, forceful vomiting.  You have dizziness or unsteadiness that is getting worse.  You have severe, continued headaches not relieved by medicine. Only take over-the-counter or prescription medicines for pain, fever, or discomfort as directed by your health care provider.  You do not have normal function of the arms or legs or are unable to walk.  You notice changes in the black spots in the center of the colored part of your eye (pupil).  You have a clear  or bloody fluid coming from your nose or ears.  You have a loss of vision. During the next 24 hours after the injury, you must stay with someone who can watch you for the warning signs. This person should contact local emergency services (911 in the [place] if you have seizures, you become unconscious, or you are unable to wake up. HOW CAN I PREVENT A HEAD INJURY IN THE FUTURE? The most important factor for preventing major head injuries is avoiding motor vehicle accidents. To minimize the potential for damage to your head, it is crucial to wear seat belts while riding in motor vehicles. Wearing helmets while bike riding and playing collision sports (like football) is also helpful. Also, avoiding dangerous activities around the house will further help reduce your risk of head injury.  WHEN CAN I RETURN TO NORMAL ACTIVITIES AND ATHLETICS? You should be reevaluated by your health care provider before returning to these activities. If you have any of the following symptoms, you should not return to  activities or contact sports until 1 week after the symptoms have stopped:  Persistent headache.  Dizziness or vertigo.  Poor attention and concentration.  Confusion.  Memory problems.  Nausea or vomiting.  Fatigue or tire easily.  Irritability.  Intolerant of bright lights or loud noises.  Anxiety or depression.  Disturbed sleep. MAKE SURE YOU:   Understand these instructions.  Will watch your condition.  Will get help right away if you are not doing well or get worse. Document Released: 11/07/2005 Document Revised: 11/12/2013 Document Reviewed: 07/15/2013 [facility] Patient Information 2015 [place], [place]. This information is not intended to replace advice given to you by your health care provider. Make sure you discuss any questions you have with your health care provider.  Laceration Care, Adult A laceration is a cut or lesion that goes through all layers of the skin and into the tissue just beneath the skin. TREATMENT  Some lacerations may not require closure. Some lacerations may not be able to be closed due to an increased risk of infection. It is important to see your caregiver as soon as possible after an injury to minimize the risk of infection and maximize the opportunity for successful closure. If closure is appropriate, pain medicines may be given, if needed. The wound will be cleaned to help prevent infection. Your caregiver will use stitches (sutures), staples, wound glue (adhesive), or skin adhesive strips to repair the laceration. These tools bring the skin edges together to allow for faster healing and a better cosmetic outcome. However, all wounds will heal with a scar. Once the wound has healed, scarring can be minimized by covering the wound with sunscreen during the day for 1 full year. HOME CARE INSTRUCTIONS  For sutures or staples:  Keep the wound clean and dry.  If you were given a bandage (dressing), you should change it at least once a day. Also,  change the dressing if it becomes wet or dirty, or as directed by your caregiver.  Wash the wound with soap and water 2 times a day. Rinse the wound off with water to remove all soap. Pat the wound dry with a clean towel.  After cleaning, apply a thin layer of the antibiotic ointment as recommended by your caregiver. This will help prevent infection and keep the dressing from sticking.  You may shower as usual after the first 24 hours. Do not soak the wound in water until the sutures are removed.  Only take over-the-counter or prescription  medicines for pain, discomfort, or fever as directed by your caregiver.  Get your sutures or staples removed as directed by your caregiver. For skin adhesive strips:  Keep the wound clean and dry.  Do not get the skin adhesive strips wet. You may bathe carefully, using caution to keep the wound dry.  If the wound gets wet, pat it dry with a clean towel.  Skin adhesive strips will fall off on their own. You may trim the strips as the wound heals. Do not remove skin adhesive strips that are still stuck to the wound. They will fall off in time. For wound adhesive:  You may briefly wet your wound in the shower or bath. Do not soak or scrub the wound. Do not swim. Avoid periods of heavy perspiration until the skin adhesive has fallen off on its own. After showering or bathing, gently pat the wound dry with a clean towel.  Do not apply liquid medicine, cream medicine, or ointment medicine to your wound while the skin adhesive is in place. This may loosen the film before your wound is healed.  If a dressing is placed over the wound, be careful not to apply tape directly over the skin adhesive. This may cause the adhesive to be pulled off before the wound is healed.  Avoid prolonged exposure to sunlight or tanning lamps while the skin adhesive is in place. Exposure to ultraviolet light in the first year will darken the scar.  The skin adhesive will usually  remain in place for 5 to 10 days, then naturally fall off the skin. Do not pick at the adhesive film. You may need a tetanus shot if:  You cannot remember when you had your last tetanus shot.  You have never had a tetanus shot. If you get a tetanus shot, your arm may swell, get red, and feel warm to the touch. This is common and not a problem. If you need a tetanus shot and you choose not to have one, there is a rare chance of getting tetanus. Sickness from tetanus can be serious. SEEK MEDICAL CARE IF:   You have redness, swelling, or increasing pain in the wound.  You see a red line that goes away from the wound.  You have yellowish-white fluid (pus) coming from the wound.  You have a fever.  You notice a bad smell coming from the wound or dressing.  Your wound breaks open before or after sutures have been removed.  You notice something coming out of the wound such as wood or glass.  Your wound is on your hand or foot and you cannot move a finger or toe. SEEK IMMEDIATE MEDICAL CARE IF:   Your pain is not controlled with prescribed medicine.  You have severe swelling around the wound causing pain and numbness or a change in color in your arm, hand, leg, or foot.  Your wound splits open and starts bleeding.  You have worsening numbness, weakness, or loss of function of any joint around or beyond the wound.  You develop painful lumps near the wound or on the skin anywhere on your body. MAKE SURE YOU:   Understand these instructions.  Will watch your condition.  Will get help right away if you are not doing well or get worse. Document Released: 11/07/2005 Document Revised: 01/30/2012 Document Reviewed: 05/03/2011 [facility] Patient Information 2015 [place], [place]. This information is not intended to replace advice given to you by your health care provider. Make sure you discuss any  questions you have with your health care provider.  Non-Sutured Laceration A laceration is a  cut or wound that goes through all layers of the skin and into the tissue just beneath the skin. Usually, these are stitched up or held together with tape or glue shortly after the injury occurred. However, if several or more hours have passed before getting care, too many germs (bacteria) get into the laceration. Stitching it closed would bring the risk of infection. If your health care provider feels your laceration is too old, it may be left open and then bandaged to allow healing from the bottom layer up. HOME CARE INSTRUCTIONS       When you re-bandage your laceration, make sure that the dressing or packing gauze goes all the way to the bottom of the laceration. The top of the laceration is kept open so it can heal from the bottom up. There is less chance for infection with this method.  If the bandage becomes wet, dirty, or has a bad smell, change it as soon as possible.  Only take medicine as directed by your health care provider. You might need a tetanus shot now if:  You have no idea when you had the last one.  You have never had a tetanus shot before.  Your laceration had dirt in it.  Your laceration was dirty, and your last tetanus shot was more than 7 years ago.  Your laceration was clean, and your last tetanus shot was more than 10 years ago. If you need a tetanus shot, and you decide not to get one, there is a rare chance of getting tetanus. Sickness from tetanus can be serious. If you got a tetanus shot, your arm may swell and get red and warm to the touch at the shot site. This is common and not a problem. SEEK MEDICAL CARE IF:   You have redness, swelling, or increasing pain in the laceration.  You notice a red line that goes away from your laceration.  You have pus coming from the laceration.  You have a fever.  You notice a bad smell coming from the laceration or dressing.  You notice something coming out of the laceration, such as wood or glass.  Your  laceration is on your hand or foot and you are unable to properly move a finger or toe.  You have severe swelling around the laceration, causing pain and numbness.  You notice a change in color in your arm, hand, leg, or foot. MAKE SURE YOU:   Understand these instructions.  Will watch your condition.  Will get help right away if you are not doing well or get worse. Document Released: 10/05/2006 Document Revised: 11/12/2013 Document Reviewed: 04/27/2009 [facility] Patient Information 2015 [place], [place]. This information is not intended to replace advice given to you by your health care provider. Make sure you discuss any questions you have with your health care provider.

## 2015-07-19 ENCOUNTER — Encounter: Payer: Self-pay | Admitting: Internal Medicine

## 2015-07-20 NOTE — Telephone Encounter (Signed)
Error

## 2015-07-23 ENCOUNTER — Encounter: Payer: Self-pay | Admitting: Internal Medicine

## 2015-07-23 ENCOUNTER — Telehealth: Payer: Self-pay | Admitting: *Deleted

## 2015-07-23 NOTE — Telephone Encounter (Signed)
Letter ready for pick up at [facility] front desk.

## 2015-08-04 ENCOUNTER — Other Ambulatory Visit: Payer: Self-pay | Admitting: Internal Medicine

## 2015-08-04 DIAGNOSIS — G629 Polyneuropathy, unspecified: Secondary | ICD-10-CM

## 2015-08-31 ENCOUNTER — Ambulatory Visit (INDEPENDENT_AMBULATORY_CARE_PROVIDER_SITE_OTHER): Payer: PRIVATE HEALTH INSURANCE

## 2015-08-31 DIAGNOSIS — Z23 Encounter for immunization: Secondary | ICD-10-CM | POA: Diagnosis not present

## 2015-10-31 ENCOUNTER — Other Ambulatory Visit: Payer: Self-pay | Admitting: Internal Medicine

## 2016-01-15 ENCOUNTER — Other Ambulatory Visit: Payer: Self-pay | Admitting: Internal Medicine

## 2016-01-15 DIAGNOSIS — B20 Human immunodeficiency virus [HIV] disease: Secondary | ICD-10-CM

## 2016-01-16 ENCOUNTER — Other Ambulatory Visit: Payer: Self-pay | Admitting: Internal Medicine

## 2016-01-16 DIAGNOSIS — B20 Human immunodeficiency virus [HIV] disease: Secondary | ICD-10-CM

## 2016-01-18 ENCOUNTER — Other Ambulatory Visit: Payer: Self-pay | Admitting: *Deleted

## 2016-01-18 DIAGNOSIS — E785 Hyperlipidemia, unspecified: Secondary | ICD-10-CM

## 2016-01-27 ENCOUNTER — Other Ambulatory Visit: Payer: PRIVATE HEALTH INSURANCE

## 2016-01-27 DIAGNOSIS — B20 Human immunodeficiency virus [HIV] disease: Secondary | ICD-10-CM

## 2016-01-27 DIAGNOSIS — E785 Hyperlipidemia, unspecified: Secondary | ICD-10-CM

## 2016-01-27 LAB — LIPID PANEL
Cholesterol: 225 mg/dL — ABNORMAL HIGH (ref 125–200)
HDL: 39 mg/dL — ABNORMAL LOW (ref >=40)
TRIGLYCERIDES: 437 mg/dL — AB (ref <150)
Total CHOL/HDL Ratio: 5.8 Ratio — ABNORMAL HIGH (ref <=5.0)

## 2016-01-27 LAB — COMPREHENSIVE METABOLIC PANEL
ALK PHOS: 38 U/L — AB (ref 40–115)
ALT: 17 U/L (ref 9–46)
AST: 16 U/L (ref 10–35)
Albumin: 4.5 g/dL (ref 3.6–5.1)
BILIRUBIN TOTAL: 0.3 mg/dL (ref 0.2–1.2)
BUN: 23 mg/dL (ref 7–25)
CALCIUM: 10.5 mg/dL — AB (ref 8.6–10.3)
CO2: 29 mmol/L (ref 20–31)
CREATININE: 1.77 mg/dL — AB (ref 0.70–1.25)
Chloride: 98 mmol/L (ref 98–110)
GLUCOSE: 125 mg/dL — AB (ref 65–99)
Potassium: 4.5 mmol/L (ref 3.5–5.3)
SODIUM: 134 mmol/L — AB (ref 135–146)
Total Protein: 7.7 g/dL (ref 6.1–8.1)

## 2016-01-27 LAB — CBC
HEMATOCRIT: 39.9 % (ref 39.0–52.0)
HEMOGLOBIN: 13.5 g/dL (ref 13.0–17.0)
MCH: 32.5 pg (ref 26.0–34.0)
MCHC: 33.8 g/dL (ref 30.0–36.0)
MCV: 95.9 fL (ref 78.0–100.0)
MPV: 8.5 fL — ABNORMAL LOW (ref 8.6–12.4)
Platelets: 259 10*3/uL (ref 150–400)
RBC: 4.16 MIL/uL — ABNORMAL LOW (ref 4.22–5.81)
RDW: 13.7 % (ref 11.5–15.5)
WBC: 3.8 10*3/uL — ABNORMAL LOW (ref 4.0–10.5)

## 2016-01-28 LAB — T-HELPER CELL (CD4) - (RCID CLINIC ONLY)
CD4 T CELL HELPER: 25 % — AB (ref 33–55)
CD4 T Cell Abs: 330 /uL — ABNORMAL LOW (ref 400–2700)

## 2016-01-29 LAB — HIV-1 RNA QUANT-NO REFLEX-BLD

## 2016-02-10 ENCOUNTER — Ambulatory Visit (INDEPENDENT_AMBULATORY_CARE_PROVIDER_SITE_OTHER): Payer: PRIVATE HEALTH INSURANCE | Admitting: Internal Medicine

## 2016-02-10 ENCOUNTER — Encounter: Payer: Self-pay | Admitting: Internal Medicine

## 2016-02-10 VITALS — BP 92/63 | HR 90 | Temp 98.5°F | Wt 164.8 lb

## 2016-02-10 DIAGNOSIS — G629 Polyneuropathy, unspecified: Secondary | ICD-10-CM | POA: Diagnosis not present

## 2016-02-10 DIAGNOSIS — N289 Disorder of kidney and ureter, unspecified: Secondary | ICD-10-CM

## 2016-02-10 DIAGNOSIS — B20 Human immunodeficiency virus [HIV] disease: Secondary | ICD-10-CM | POA: Diagnosis not present

## 2016-02-10 DIAGNOSIS — F329 Major depressive disorder, single episode, unspecified: Secondary | ICD-10-CM | POA: Diagnosis not present

## 2016-02-10 DIAGNOSIS — F32A Depression, unspecified: Secondary | ICD-10-CM

## 2016-02-10 NOTE — Assessment & Plan Note (Signed)
He has mild, stable renal insufficiency since at least 2012.

## 2016-02-10 NOTE — Progress Notes (Signed)
Patient Active Problem List   Diagnosis Date Noted  . Peripheral neuropathy ([place]) 09/30/2013    Priority: High  . Human immunodeficiency virus (HIV) disease ([place]) 12/01/2006    Priority: High  . Depression 10/01/2013    Priority: Medium  . Renal insufficiency 08/19/2013    Priority: Medium  . 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 Hypothyroidism 11/01/2012  . Hyperglycemia 12/08/2011  . HYPOGONADISM 12/23/2009  . Dyslipidemia 01/03/2008  . SYPHILIS, [place], LATENT NOS 01/18/2007  . HYPERTENSION 12/01/2006  . PROTEINURIA 12/01/2006    Patient's Medications  New Prescriptions   No medications on file  Previous Medications   BENAZEPRIL-HYDROCHLORTHIAZIDE (LOTENSIN HCT) 10-12.5 MG PER TABLET    TAKE 1 TABLET BY MOUTH EVERY DAY   CHLORHEXIDINE (PERIDEX) 0.12 % SOLUTION    Use as directed 15 mLs in the mouth or throat 2 (two) times daily.   CLONAZEPAM (KLONOPIN) 1 MG TABLET    TAKE 1 TABLET 3 TIMES A DAY AS NEEDED FOR ANXIETY   DRONABINOL (MARINOL) 5 MG CAPSULE    Take 1 capsule (5 mg total) by mouth 2 (two) times daily before a meal.   FENOFIBRATE (TRICOR) 145 MG TABLET    TAKE 1 TABLET (145 MG TOTAL) BY MOUTH DAILY.   FLUOXETINE (PROZAC) 20 MG CAPSULE    TAKE 1 CAPSULE (20 MG TOTAL) BY MOUTH DAILY.   LEVOTHYROXINE (SYNTHROID, LEVOTHROID) 50 MCG TABLET    Take 1 tablet (50 mcg total) by mouth daily before breakfast.   MULTIPLE VITAMIN (MULTIVITAMIN) TABLET    Take 1 tablet by mouth daily.   PILOCARPINE (SALAGEN) 5 MG TABLET    TAKE 1 TABLET (5 MG TOTAL) BY MOUTH 3 (THREE) TIMES DAILY.   TEMAZEPAM (RESTORIL) 15 MG CAPSULE    Take 2 capsules (30 mg total) by mouth at bedtime as needed for sleep.   TRAMADOL (ULTRAM) 50 MG TABLET    TAKE 1 TABLET BY MOUTH EVERY 6 HOURS AS NEEDED   TRIUMEQ 600-50-300 MG TABLET    TAKE 1 TABLET BY MOUTH DAILY.  Modified Medications   No medications on file    Discontinued Medications   CEPHALEXIN (KEFLEX) 500 MG CAPSULE    1 cap tid x 3 d    Subjective: [name] is in for his routine HIV follow-up visit. He has had no problems obtaining, taking or tolerating Triumeq. He takes it each morning and has not missed any doses. He continues to take his Prozac. He is not feeling depressed currently and he feels like the Prozac has definitely helped his painful peripheral neuropathy. He also has had 2 recent acupuncture treatments and notes that the residual pain and numbness in his feet has improved even more.   Review of Systems: Review of Systems  Constitutional: Negative for fever, chills, weight loss, malaise/fatigue and diaphoresis.  HENT: Negative for sore throat.   Respiratory: Negative for cough, sputum production and shortness of breath.   Cardiovascular: Negative for chest pain.  Gastrointestinal: Negative for nausea, vomiting and diarrhea.  Genitourinary: Negative for dysuria and frequency.  Musculoskeletal: Negative for myalgias and joint pain.  Skin: Negative for rash.  Neurological: Positive for sensory change. Negative for dizziness, focal weakness and headaches.  Psychiatric/Behavioral: Negative for depression and substance abuse. The patient is not nervous/anxious.     Past Medical History  Diagnosis Date  . 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 Hypertension   . HIV DISEASE 12/01/2006  . SYPHILIS, [place], LATENT NOS 01/18/2007  . HYPOGONADISM 12/23/2009  . HYPERLIPIDEMIA, WITH LOW HDL 01/03/2008  . HYPERTENSION 12/01/2006    Social History  Substance Use Topics  . Smoking status: Never Smoker   . Smokeless tobacco: Never Used  . Alcohol Use: 0.0 oz/week    0 Standard drinks or equivalent per week     Comment: social    No family history on file.  Allergies  Allergen Reactions  . Codeine   . Sulfamethoxazole-Trimethoprim   . Sulfonamide Derivatives     Objective:  Filed Vitals:   02/10/16 1624  BP: 92/63  Pulse:  90  Temp: 98.5 F (36.9 C)  TempSrc: Oral  Weight: 164 lb 12 oz (74.73 kg)   Body mass index is 23.64 kg/(m^2).  Physical Exam  Constitutional: He is oriented to person, place, and time.  He is in good spirits.  HENT:  Mouth/Throat: No oropharyngeal exudate.  Mucous membranes are moist.  Eyes: Conjunctivae are normal.  Cardiovascular: Normal rate and regular rhythm.   No murmur heard. Pulmonary/Chest: Breath sounds normal.  Abdominal: Soft. He exhibits no mass. There is no tenderness.  Musculoskeletal: Normal range of motion.  Neurological: He is alert and oriented to person, place, and time.  Skin: No rash noted.  Psychiatric: Mood and affect normal.    Lab Results Lab Results  Component Value Date   WBC 3.8* 01/27/2016   HGB 13.5 01/27/2016   HCT 39.9 01/27/2016   MCV 95.9 01/27/2016   PLT 259 01/27/2016    Lab Results  Component Value Date   CREATININE 1.77* 01/27/2016   BUN 23 01/27/2016   NA 134* 01/27/2016   K 4.5 01/27/2016   CL 98 01/27/2016   CO2 29 01/27/2016    Lab Results  Component Value Date   ALT 17 01/27/2016   AST 16 01/27/2016   ALKPHOS 38* 01/27/2016   BILITOT 0.3 01/27/2016    Lab Results  Component Value Date   CHOL 225* 01/27/2016   HDL 39* 01/27/2016   LDLCALC NOT CALC 01/27/2016   TRIG 437* 01/27/2016   CHOLHDL 5.8* 01/27/2016    Lab Results HIV 1 RNA QUANT (copies/mL)  Date Value  01/27/2016 <20  04/30/2015 <20  10/23/2014 <20   CD4 T CELL ABS (/uL)  Date Value  01/27/2016 330*  04/30/2015 244*  10/23/2014 310*      Problem List Items Addressed This Visit      High   Human immunodeficiency virus (HIV) disease ([place])    His infection remains under excellent control. He will continue Triumeq and follow-up here after blood work in 6 months.      Relevant Orders   T-helper cell (CD4)- ([facility] clinic only)   HIV 1 RNA quant-no reflex-bld   CBC   Comprehensive metabolic panel   Peripheral neuropathy ([place]) - Primary      His painful peripheral neuropathy has improved with Prozac and acupuncture.        Medium   Depression    His depression has improved.      Renal insufficiency    He has mild, stable renal insufficiency since at least 2012.           Michel Bickers, MD [facility] [phone] pager   [phone] cell 02/10/2016, 4:50 PM

## 2016-02-10 NOTE — Assessment & Plan Note (Signed)
His infection remains under excellent control. He will continue Triumeq and follow-up here after blood work in 6 months.

## 2016-02-10 NOTE — Assessment & Plan Note (Signed)
His depression has improved.

## 2016-02-10 NOTE — Assessment & Plan Note (Signed)
His painful peripheral neuropathy has improved with Prozac and acupuncture.

## 2016-03-21 ENCOUNTER — Other Ambulatory Visit: Payer: Self-pay | Admitting: Internal Medicine

## 2016-07-03 ENCOUNTER — Other Ambulatory Visit: Payer: Self-pay | Admitting: Internal Medicine

## 2016-07-03 DIAGNOSIS — B20 Human immunodeficiency virus [HIV] disease: Secondary | ICD-10-CM

## 2016-07-05 ENCOUNTER — Other Ambulatory Visit: Payer: Self-pay | Admitting: Internal Medicine

## 2016-07-05 DIAGNOSIS — G629 Polyneuropathy, unspecified: Secondary | ICD-10-CM

## 2016-07-18 ENCOUNTER — Ambulatory Visit (INDEPENDENT_AMBULATORY_CARE_PROVIDER_SITE_OTHER): Payer: PRIVATE HEALTH INSURANCE | Admitting: *Deleted

## 2016-07-18 ENCOUNTER — Other Ambulatory Visit: Payer: PRIVATE HEALTH INSURANCE

## 2016-07-18 DIAGNOSIS — B20 Human immunodeficiency virus [HIV] disease: Secondary | ICD-10-CM

## 2016-07-18 DIAGNOSIS — Z23 Encounter for immunization: Secondary | ICD-10-CM | POA: Diagnosis not present

## 2016-07-18 LAB — CBC
HCT: 39.4 % (ref 38.5–50.0)
HEMOGLOBIN: 13.2 g/dL (ref 13.2–17.1)
MCH: 31.5 pg (ref 27.0–33.0)
MCHC: 33.5 g/dL (ref 32.0–36.0)
MCV: 94 fL (ref 80.0–100.0)
MPV: 9.2 fL (ref 7.5–12.5)
Platelets: 235 10*3/uL (ref 140–400)
RBC: 4.19 MIL/uL — AB (ref 4.20–5.80)
RDW: 13.6 % (ref 11.0–15.0)
WBC: 4.1 10*3/uL (ref 3.8–10.8)

## 2016-07-18 LAB — COMPREHENSIVE METABOLIC PANEL
ALBUMIN: 4.1 g/dL (ref 3.6–5.1)
ALK PHOS: 43 U/L (ref 40–115)
ALT: 26 U/L (ref 9–46)
AST: 28 U/L (ref 10–35)
BILIRUBIN TOTAL: 0.4 mg/dL (ref 0.2–1.2)
BUN: 17 mg/dL (ref 7–25)
CO2: 25 mmol/L (ref 20–31)
CREATININE: 1.5 mg/dL — AB (ref 0.70–1.25)
Calcium: 9.8 mg/dL (ref 8.6–10.3)
Chloride: 104 mmol/L (ref 98–110)
Glucose, Bld: 95 mg/dL (ref 65–99)
Potassium: 4.3 mmol/L (ref 3.5–5.3)
SODIUM: 139 mmol/L (ref 135–146)
Total Protein: 7.5 g/dL (ref 6.1–8.1)

## 2016-07-18 NOTE — Progress Notes (Signed)
Needing Flu vaccine.

## 2016-07-19 LAB — T-HELPER CELL (CD4) - (RCID CLINIC ONLY)
CD4 T CELL ABS: 370 /uL — AB (ref 400–2700)
CD4 T CELL HELPER: 26 % — AB (ref 33–55)

## 2016-07-19 LAB — HIV-1 RNA QUANT-NO REFLEX-BLD: HIV-1 RNA Quant, Log: <1.3 Log copies/mL (ref <1.30)

## 2016-08-09 ENCOUNTER — Encounter: Payer: Self-pay | Admitting: Internal Medicine

## 2016-08-09 ENCOUNTER — Ambulatory Visit (INDEPENDENT_AMBULATORY_CARE_PROVIDER_SITE_OTHER): Payer: PRIVATE HEALTH INSURANCE | Admitting: Internal Medicine

## 2016-08-09 DIAGNOSIS — Z23 Encounter for immunization: Secondary | ICD-10-CM | POA: Diagnosis not present

## 2016-08-09 DIAGNOSIS — B20 Human immunodeficiency virus [HIV] disease: Secondary | ICD-10-CM | POA: Diagnosis not present

## 2016-08-09 NOTE — Assessment & Plan Note (Signed)
His infection remains under excellent, long-term control. He will continue Triumeq and follow-up in one year.

## 2016-08-09 NOTE — Progress Notes (Signed)
Patient Active Problem List   Diagnosis Date Noted  . Peripheral neuropathy ([place]) 09/30/2013    Priority: High  . Human immunodeficiency virus (HIV) disease ([place]) 12/01/2006    Priority: High  . Depression 10/01/2013    Priority: Medium  . Renal insufficiency 08/19/2013    Priority: Medium  . 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 Oral cancer ([place]) 08/18/2008    Priority: Medium  . Hypothyroidism 11/01/2012  . Hyperglycemia 12/08/2011  . HYPOGONADISM 12/23/2009  . Dyslipidemia 01/03/2008  . SYPHILIS, [place], LATENT NOS 01/18/2007  . HYPERTENSION 12/01/2006  . PROTEINURIA 12/01/2006    Patient's Medications  New Prescriptions   No medications on file  Previous Medications   BENAZEPRIL (LOTENSIN) 10 MG TABLET    Take 10 mg by mouth daily.   CHLORHEXIDINE (PERIDEX) 0.12 % SOLUTION    Use as directed 15 mLs in the mouth or throat 2 (two) times daily.   CLONAZEPAM (KLONOPIN) 1 MG TABLET    TAKE 1 TABLET 3 TIMES A DAY AS NEEDED FOR ANXIETY   FENOFIBRATE (TRICOR) 145 MG TABLET    TAKE 1 TABLET (145 MG TOTAL) BY MOUTH DAILY.   FLUOXETINE (PROZAC) 20 MG CAPSULE    TAKE ONE CAPSULE BY MOUTH DAILY   LEVOTHYROXINE (SYNTHROID, LEVOTHROID) 50 MCG TABLET    Take 1 tablet (50 mcg total) by mouth daily before breakfast.   MULTIPLE VITAMIN (MULTIVITAMIN) TABLET    Take 1 tablet by mouth daily.   PILOCARPINE (SALAGEN) 5 MG TABLET    TAKE 1 TABLET (5 MG TOTAL) BY MOUTH 3 (THREE) TIMES DAILY.   TEMAZEPAM (RESTORIL) 30 MG CAPSULE    Take 30 mg by mouth at bedtime.   TRIUMEQ 600-50-300 MG TABLET    TAKE 1 TABLET BY MOUTH DAILY.  Modified Medications   No medications on file  Discontinued Medications   BENAZEPRIL-HYDROCHLORTHIAZIDE (LOTENSIN HCT) 10-12.5 MG PER TABLET    TAKE 1 TABLET BY MOUTH EVERY DAY   DRONABINOL (MARINOL) 5 MG CAPSULE    Take 1 capsule (5 mg total) by mouth 2 (two) times daily before a meal.   TEMAZEPAM (RESTORIL) 15 MG CAPSULE    Take 2  capsules (30 mg total) by mouth at bedtime as needed for sleep.   TRAMADOL (ULTRAM) 50 MG TABLET    TAKE 1 TABLET BY MOUTH EVERY 6 HOURS AS NEEDED    Subjective: Christopher [name] Is in for his routine HIV follow-up visit. He has had no problems obtaining, taking or tolerating his Triumeq. He does not recall missing any doses. He is doing well and has not had any health concerns. His partner, Christopher [name], is struggling with severe low back pain and may need surgery.   Review of Systems: Review of Systems  Constitutional: Negative for chills, diaphoresis, fever, malaise/fatigue and weight loss.  HENT: Negative for sore throat.   Respiratory: Negative for cough, sputum production and shortness of breath.   Cardiovascular: Negative for chest pain.  Gastrointestinal: Negative for abdominal pain, diarrhea, nausea and vomiting.  Genitourinary: Negative for dysuria and frequency.  Musculoskeletal: Negative for joint pain and myalgias.  Skin: Negative for rash.  Neurological: Negative for dizziness and headaches.  Psychiatric/Behavioral: Negative for depression and substance abuse. The patient is not nervous/anxious.     Past Medical History:  Diagnosis Date  . HIV DISEASE 12/01/2006  . HYPERLIPIDEMIA, WITH LOW HDL 01/03/2008  . Hypertension   . HYPERTENSION 12/01/2006  .  HYPOGONADISM 12/23/2009  . SYPHILIS, [place], LATENT NOS 01/18/2007  . 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 Smoking status: Never Smoker  . Smokeless tobacco: Never Used  . Alcohol use 0.0 oz/week     Comment: social    No family history on file.  Allergies  Allergen Reactions  . Codeine   . Sulfamethoxazole-Trimethoprim   . Sulfonamide Derivatives     Objective:  Vitals:   08/09/16 0847  Weight: 157 lb (71.2 kg)  Height: 5\' 10"  (1.778 m)   Body mass index is 22.53 kg/m.  Physical Exam  Constitutional: He is oriented to person, place, and time.  He is in good spirits as usual.    HENT:  Mouth/Throat: No oropharyngeal exudate.  Eyes: Conjunctivae are normal.  Cardiovascular: Normal rate and regular rhythm.   No murmur heard. Pulmonary/Chest: Effort normal and breath sounds normal.  Abdominal: Soft. There is no tenderness.  Musculoskeletal: Normal range of motion.  Neurological: He is alert and oriented to person, place, and time. Gait normal.  Skin: No rash noted.  Psychiatric: Mood and affect normal.    Lab Results Lab Results  Component Value Date   WBC 4.1 07/18/2016   HGB 13.2 07/18/2016   HCT 39.4 07/18/2016   MCV 94.0 07/18/2016   PLT 235 07/18/2016    Lab Results  Component Value Date   CREATININE 1.50 (H) 07/18/2016   BUN 17 07/18/2016   NA 139 07/18/2016   K 4.3 07/18/2016   CL 104 07/18/2016   CO2 25 07/18/2016    Lab Results  Component Value Date   ALT 26 07/18/2016   AST 28 07/18/2016   ALKPHOS 43 07/18/2016   BILITOT 0.4 07/18/2016    Lab Results  Component Value Date   CHOL 225 (H) 01/27/2016   HDL 39 (L) 01/27/2016   LDLCALC NOT CALC 01/27/2016   TRIG 437 (H) 01/27/2016   CHOLHDL 5.8 (H) 01/27/2016   HIV 1 RNA Quant (copies/mL)  Date Value  07/18/2016 <20  01/27/2016 <20  04/30/2015 <20   CD4 T Cell Abs (/uL)  Date Value  07/18/2016 370 (L)  01/27/2016 330 (L)  04/30/2015 244 (L)     Problem List Items Addressed This Visit      High   Human immunodeficiency virus (HIV) disease ([place])    His infection remains under excellent, long-term control. He will continue Triumeq and follow-up in one year.       Other Visit Diagnoses   None.       Michel Bickers, MD [facility] [phone] pager   [phone] cell 08/09/2016, 9:11 AM

## 2016-08-29 ENCOUNTER — Telehealth: Payer: Self-pay | Admitting: Pharmacist Clinician (PhC)/ Clinical Pharmacy Specialist

## 2016-08-29 NOTE — Telephone Encounter (Signed)
Christopher [name] called and asked to see if Triumeq can be crushed. Told him that it can be.

## 2016-10-13 ENCOUNTER — Other Ambulatory Visit: Payer: Self-pay | Admitting: Internal Medicine

## 2016-12-03 ENCOUNTER — Other Ambulatory Visit: Payer: Self-pay | Admitting: Internal Medicine

## 2016-12-03 DIAGNOSIS — B20 Human immunodeficiency virus [HIV] disease: Secondary | ICD-10-CM

## 2017-03-14 ENCOUNTER — Other Ambulatory Visit (INDEPENDENT_AMBULATORY_CARE_PROVIDER_SITE_OTHER): Payer: Self-pay | Admitting: Otolaryngology

## 2017-03-14 DIAGNOSIS — R1310 Dysphagia, unspecified: Secondary | ICD-10-CM

## 2017-03-23 ENCOUNTER — Ambulatory Visit (HOSPITAL_COMMUNITY)
Admission: RE | Admit: 2017-03-23 | Discharge: 2017-03-23 | Disposition: A | Discharge status: Home / Self Care | Payer: PRIVATE HEALTH INSURANCE | Source: Ambulatory Visit | Attending: Otolaryngology | Admitting: Otolaryngology

## 2017-03-23 DIAGNOSIS — R1310 Dysphagia, unspecified: Secondary | ICD-10-CM | POA: Insufficient documentation

## 2017-03-23 DIAGNOSIS — R933 Abnormal findings on diagnostic imaging of other parts of digestive tract: Secondary | ICD-10-CM | POA: Diagnosis not present

## 2017-03-28 ENCOUNTER — Other Ambulatory Visit: Payer: Self-pay | Admitting: Internal Medicine

## 2017-03-28 DIAGNOSIS — B20 Human immunodeficiency virus [HIV] disease: Secondary | ICD-10-CM

## 2017-04-05 ENCOUNTER — Other Ambulatory Visit: Payer: Self-pay | Admitting: Internal Medicine

## 2017-04-05 DIAGNOSIS — B20 Human immunodeficiency virus [HIV] disease: Secondary | ICD-10-CM

## 2017-04-06 ENCOUNTER — Ambulatory Visit (INDEPENDENT_AMBULATORY_CARE_PROVIDER_SITE_OTHER): Payer: PRIVATE HEALTH INSURANCE | Admitting: Physician Assistant

## 2017-04-06 ENCOUNTER — Encounter: Payer: Self-pay | Admitting: Physician Assistant

## 2017-04-06 VITALS — BP 82/50 | HR 80 | Ht 70.0 in | Wt 157.0 lb

## 2017-04-06 DIAGNOSIS — R1310 Dysphagia, unspecified: Secondary | ICD-10-CM

## 2017-04-06 DIAGNOSIS — K219 Gastro-esophageal reflux disease without esophagitis: Secondary | ICD-10-CM

## 2017-04-06 DIAGNOSIS — R933 Abnormal findings on diagnostic imaging of other parts of digestive tract: Secondary | ICD-10-CM

## 2017-04-06 MED ORDER — PANTOPRAZOLE SODIUM 40 MG PO TBEC: DELAYED_RELEASE_TABLET | ORAL | 3 refills | Status: DC

## 2017-04-06 NOTE — Progress Notes (Signed)
Subjective:    Patient ID: Christopher [name], male    DOB: 1951/05/12, 66 y.o.   MRN: [id number]  HPI Will is a pleasant 66 year old white male, new to GI today referred by Dr. Thea Gist for evaluation of dysphagia. Patient has history of hypertension, HIV which has been under excellent control on therapy. He is followed by Dr. Megan Salon. 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 He subsequently developed and abscess in a tooth, has what sounds like osteomyelitis and wound up having a wide excision of that area of his lower right jaw at [facility] about 6 years ago. He says he has had some difficulty swallowing for many years. He had been referred to speech therapy 6 years ago after the jaw surgery and says he did not find it helpful at all. He does not want to do speech therapy now. He had a barium swallow done on 03/23/2017 showing frank aspiration with minimal throat clearing but no cough reflux elicited and wall irregularity in the distal esophagus, nonspecific as this could not be fully distended but upper endoscopy recommended. Patient states that he has had ongoing problems with heartburn and indigestion for years. He feels that is indigestion is worse after alcohol, and he feels that he also has more episodes of dysphagia and aspiration after drinking alcohol.   He says he chokes intermittently very frequently and feels that his pills get stuck at times as well. He has had a lot of burning in his pharyngeal area and due to pills sitting for prolonged periods of time. He generally does better eating very soft foods. He says he has quit going out to eat over the past few years because it takes him to 3 hours to complete a meal and that he frequently will have some coughing or choking. Fortunately he has not had any episodes of aspiration pneumonia.  He feels he may have had a very remote EGD, he had colonoscopy about 15 years  ago probably by Dr. Collene Mares ,he says  this was a negative exam. He has no family history of colon cancer. He has decided he does not want to have any further screening for colon cancer. He says he has been miserable because of his tonsillar cancer and subsequent complications.  Review of Systems Pertinent positive and negative review of systems were noted in the above HPI section.  All other review of systems was otherwise negative.  Outpatient Encounter Prescriptions as of 04/06/2017  Medication Sig  . benazepril (LOTENSIN) 10 MG tablet Take 10 mg by mouth daily.  . clonazePAM (KLONOPIN) 1 MG tablet TAKE 1 TABLET 3 TIMES A DAY AS NEEDED FOR ANXIETY  . FLUoxetine (PROZAC) 20 MG capsule TAKE ONE CAPSULE BY MOUTH DAILY  . levothyroxine (SYNTHROID, LEVOTHROID) 50 MCG tablet Take 1 tablet (50 mcg total) by mouth daily before breakfast.  . pilocarpine (SALAGEN) 5 MG tablet TAKE 1 TABLET (5 MG TOTAL) BY MOUTH 3 (THREE) TIMES DAILY.  Marland Kitchen temazepam (RESTORIL) 30 MG capsule Take 30 mg by mouth at bedtime.  . traMADol (ULTRAM) 50 MG tablet Take by mouth every 6 (six) hours as needed.  . TRIUMEQ 600-50-300 MG tablet TAKE 1 TABLET BY MOUTH DAILY.  . chlorhexidine (PERIDEX) 0.12 % solution Use as directed 15 mLs in the mouth or throat 2 (two) times daily.  . pantoprazole (PROTONIX) 40 MG tablet Take 1 tablet by mouth every morning  . [  DISCONTINUED] fenofibrate (TRICOR) 145 MG tablet TAKE 1 TABLET BY MOUTH DAILY  . [DISCONTINUED] Multiple Vitamin (MULTIVITAMIN) tablet Take 1 tablet by mouth daily.   No facility-administered encounter medications on file as of 04/06/2017.    Allergies  Allergen Reactions  . Codeine   . Sulfamethoxazole-Trimethoprim   . Sulfonamide Derivatives    Patient Active Problem List   Diagnosis Date Noted  . Depression 10/01/2013  . Peripheral neuropathy 09/30/2013  . Renal insufficiency 08/19/2013  . Hypothyroidism 11/01/2012  . 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  Hyperglycemia 12/08/2011  . HYPOGONADISM 12/23/2009  . Oral cancer ([employer]) 08/18/2008  . Dyslipidemia 01/03/2008  . SYPHILIS, [place], LATENT NOS 01/18/2007  . Human immunodeficiency virus (HIV) disease ([place]) 12/01/2006  . HYPERTENSION 12/01/2006  . PROTEINURIA 12/01/2006   Social History   Social History  . Marital status: Single    Spouse name: N/A  . Number of children: N/A  . Years of education: N/A   Occupational History  . retired    Social History Main Topics  . Smoking status: Never Smoker  . Smokeless tobacco: Never Used  . Alcohol use 1.2 oz/week    2 Shots of liquor per week     Comment: social, martini on the weekends  . Drug use: No  . Sexual activity: Not Currently     Comment: declined condoms   Other Topics Concern  . Not on file   Social History Narrative  . No narrative on file    [name] family history includes COPD in his mother; Diabetes in his mother; Heart attack in his father.      Objective:    Vitals:   04/06/17 0921  BP: (!) 82/50  Pulse: 80    Physical Exam  well-developed older white male in no acute distress, pleasant blood pressure 82/50 pulse 80, height 5 foot 10, BMI 22.5. 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 Patient does not wish to undergo evaluation with speech therapy 32 Solid food dysphagia and chronic GERD. Abnormal barium swallow showing irregularity of the distal esophagus, no definite stricture. Rule out esophagitis, rule out neoplasm, rule out [place] stricture #3 HIV-under very good control on Triumeq #4 hypertension #5 peripheral neuropathy #6 colon cancer surveillance-negative remote colonoscopy approximately 15 years ago, patient declines any further surveillance.  Plan; start Protonix 40 mg by mouth every morning Patient will be scheduled for EGD and possible dilation with Dr. Loletha Carrow . Procedure discussed in detail with patient including risks and benefits and he is agreeable to proceed.  Amy S Esterwood PA-C 04/06/2017   Cc: Leta Baptist, MD

## 2017-04-06 NOTE — Patient Instructions (Signed)
We have sent the following medications to your pharmacy for you to pick up at your convenience: 90 supply of Pantoprazole sodium 40 mg.   You have been scheduled for an endoscopy. Please follow written instructions given to you at your visit today. If you use inhalers (even only as needed), please bring them with you on the day of your procedure. Your physician has requested that you go to www.startemmi.com and enter the access code given to you at your visit today. This web site gives a general overview about your procedure. However, you should still follow specific instructions given to you by our office regarding your preparation for the procedure.

## 2017-04-07 ENCOUNTER — Other Ambulatory Visit: Payer: Self-pay | Admitting: Internal Medicine

## 2017-04-07 DIAGNOSIS — G629 Polyneuropathy, unspecified: Secondary | ICD-10-CM

## 2017-04-07 NOTE — Progress Notes (Signed)
Thank you for sending this case to me. I have reviewed the entire note, and the outlined plan seems appropriate.  The EGD can assess the reported distal esophageal abnormality seen on imaging, but his dysphagia sounds largely oropharyngeal from prior radiation and surgery.  Wilfrid Lund, MD

## 2017-04-10 ENCOUNTER — Ambulatory Visit (AMBULATORY_SURGERY_CENTER): Payer: PRIVATE HEALTH INSURANCE | Admitting: Gastroenterology

## 2017-04-10 ENCOUNTER — Telehealth: Payer: Self-pay | Admitting: Gastroenterology

## 2017-04-10 ENCOUNTER — Encounter: Payer: Self-pay | Admitting: Gastroenterology

## 2017-04-10 ENCOUNTER — Other Ambulatory Visit: Payer: Self-pay | Admitting: Gastroenterology

## 2017-04-10 VITALS — BP 116/76 | HR 71 | Temp 98.4°F | Resp 11 | Ht 70.0 in | Wt 157.0 lb

## 2017-04-10 DIAGNOSIS — R933 Abnormal findings on diagnostic imaging of other parts of digestive tract: Secondary | ICD-10-CM | POA: Diagnosis not present

## 2017-04-10 DIAGNOSIS — R1310 Dysphagia, unspecified: Secondary | ICD-10-CM | POA: Diagnosis present

## 2017-04-10 DIAGNOSIS — R1319 Other dysphagia: Secondary | ICD-10-CM

## 2017-04-10 MED ORDER — SODIUM CHLORIDE 0.9 % IV SOLN: 500.0000 mL | INTRAVENOUS | Status: DC

## 2017-04-10 NOTE — Telephone Encounter (Signed)
Please see my 5/21 EGD note for details, and then schedule this patient for a repeat EGD with dilation in [place] in about 8 weeks.

## 2017-04-10 NOTE — Progress Notes (Signed)
  [facility] Post-op Note  Patient: Christopher [name]  Procedure(s) Performed: endoscopy  Patient Location: [facility] - Recovery Area  Anesthesia Type: Deep Sedation/Propofol  Level of Consciousness: awake, oriented and patient cooperative  Airway and Oxygen Therapy: Patient Spontanous Breathing  Post-op Pain: none  Post-op Assessment:  Post-op Vital signs reviewed, Patient's Cardiovascular Status Stable, Respiratory Function Stable, Patent Airway, No signs of Nausea or vomiting and Pain level controlled  Post-op Vital Signs: Reviewed and stable  Complications: No apparent anesthesia complications  Joelie Schou E Nikolaus Pienta 4:50 PM

## 2017-04-10 NOTE — Patient Instructions (Addendum)
YOU HAD AN ENDOSCOPIC PROCEDURE TODAY AT [facility]:   Refer to the procedure report that was given to you for any specific questions about what was found during the examination.  If the procedure report does not answer your questions, please call your gastroenterologist to clarify.  If you requested that your care partner not be given the details of your procedure findings, then the procedure report has been included in a sealed envelope for you to review at your convenience later.  YOU SHOULD EXPECT: Some feelings of bloating in the abdomen. Passage of more gas than usual.  Walking can help get rid of the air that was put into your GI tract during the procedure and reduce the bloating. If you had a lower endoscopy (such as a colonoscopy or flexible sigmoidoscopy) you may notice spotting of blood in your stool or on the toilet paper. If you underwent a bowel prep for your procedure, you may not have a normal bowel movement for a few days.  Please Note:  You might notice some irritation and congestion in your nose or some drainage.  This is from the oxygen used during your procedure.  There is no need for concern and it should clear up in a day or so.  SYMPTOMS TO REPORT IMMEDIATELY:    Following upper endoscopy (EGD)  Vomiting of blood or coffee ground material  New chest pain or pain under the shoulder blades  Painful or persistently difficult swallowing  New shortness of breath  Fever of 100F or higher  Black, tarry-looking stools  For urgent or emergent issues, a gastroenterologist can be reached at any hour by calling [phone]. Start omeprazole 20 mg over the counter with evening meals in addition to the Protonix with am meal.  DIET:  We do recommend a small meal at first, but then you may proceed to your regular diet.  Drink plenty of fluids but you should avoid alcoholic beverages for 24 hours.  ACTIVITY:  You should plan to take it easy for the rest of today  and you should NOT DRIVE or use heavy machinery until tomorrow (because of the sedation medicines used during the test).    FOLLOW UP: Our staff will call the number listed on your records the next business day following your procedure to check on you and address any questions or concerns that you may have regarding the information given to you following your procedure. If we do not reach you, we will leave a message.  However, if you are feeling well and you are not experiencing any problems, there is no need to return our call.  We will assume that you have returned to your regular daily activities without incident.  If any biopsies were taken you will be contacted by phone or by letter within the next 1-3 weeks.  Please call us at [phone] if you have not heard about the biopsies in 3 weeks.    SIGNATURES/CONFIDENTIALITY: You and/or your care partner have signed paperwork which will be entered into your electronic medical record.  These signatures attest to the fact that that the information above on your After Visit Summary has been reviewed and is understood.  Full responsibility of the confidentiality of this discharge information lies with you and/or your care-partner.  Thank you for letting us take care of your healthcare needs today.

## 2017-04-10 NOTE — Op Note (Signed)
Christopher [name] Patient Name: Christopher [name] Procedure Date: 04/10/2017 4:19 PM MRN: [id number] Endoscopist: Mallie Mussel L. Loletha Carrow , MD Age: 66 Referring MD:  Date of Birth: Mar 10, 1951 Gender: Male Account #: [id number] Procedure:                Upper GI endoscopy Indications:              Oropharyngeal phase dysphagia (from prior tonsillar                            cancer and surgery), Esophageal dysphagia, Abnormal                            UGI series suggesting distal esophageal stricture Medicines:                Monitored Anesthesia Care Procedure:                Pre-Anesthesia Assessment:                           - Prior to the procedure, a History and Physical                            was performed, and patient medications and                            allergies were reviewed. The patient's tolerance of                            previous anesthesia was also reviewed. The risks                            and benefits of the procedure and the sedation                            options and risks were discussed with the patient.                            All questions were answered, and informed consent                            was obtained. Prior Anticoagulants: The patient has                            taken no previous anticoagulant or antiplatelet                            agents. ASA Grade Assessment: III - A patient with                            severe systemic disease. After reviewing the risks                            and benefits, the patient was deemed in  satisfactory condition to undergo the procedure.                           After obtaining informed consent, the endoscope was                            passed under direct vision. Throughout the                            procedure, the patient's blood pressure, pulse, and                            oxygen saturations were monitored continuously. The   Endoscope was introduced through the mouth, and                            advanced to the second part of duodenum. The upper                            GI endoscopy was accomplished without difficulty.                            The patient tolerated the procedure well. Scope In: Scope Out: Findings:                 LA Grade D (one or more mucosal breaks involving at                            least 75% of esophageal circumference) esophagitis                            with no bleeding was found 30 to 40 cm from the                            incisors. There was severe ulceration with                            friability and stricture of the last few                            centimeters of the distal esophagus. The scope was                            able to pass with mild resistance.                           A medium-sized hiatal hernia was present.                           The stomach was normal.                           The cardia and gastric fundus were normal on  retroflexion.                           The examined duodenum was normal. Complications:            No immediate complications. Estimated Blood Loss:     Estimated blood loss: none. Impression:               Esophageal dilation was not performed due to the                            severe esophagitis.                           - LA Grade D reflux esophagitis.                           - Medium-sized hiatal hernia.                           - Normal stomach.                           - Normal examined duodenum.                           - No specimens collected. Recommendation:           - Patient has a contact number available for                            emergencies. The signs and symptoms of potential                            delayed complications were discussed with the                            patient. Return to normal activities tomorrow.                            Written  discharge instructions were provided to the                            patient.                           - Resume previous diet.                           - Continue present medications. In addition to the                            once daily PPI recently started (which should be                            taken with AM meal), take omeprazole 20 mg                            (  available OTC) with supper meal.                           Reassess esophageal dysphagia symptoms at that time                            and decide if repeat EGD for dilation needed.                           - Repeat upper endoscopy in 8 weeks to check                            healing and perform dilation of stricture. Christopher [name] L. Loletha Carrow, MD 04/10/2017 4:54:04 PM This report has been signed electronically. CC Letter to:             Nicola Police, MD

## 2017-04-11 ENCOUNTER — Telehealth: Payer: Self-pay | Admitting: *Deleted

## 2017-04-11 NOTE — Telephone Encounter (Signed)
Left message for patient to call office, I have scheduled him for a previsit and EGD.

## 2017-04-11 NOTE — Telephone Encounter (Signed)
  Follow up Call-  Call back number 04/10/2017  Post procedure Call Back phone  # [phone]  Permission to leave phone message Yes  Some recent data might be hidden     Patient questions:  Do you have a fever, pain , or abdominal swelling? No. Pain Score  0 *  Have you tolerated food without any problems? Yes.    Have you been able to return to your normal activities? Yes.    Do you have any questions about your discharge instructions: Diet   No. Medications  No. Follow up visit  No.  Do you have questions or concerns about your Care? No.  Actions: * If pain score is 4 or above: No action needed, pain <4.

## 2017-04-11 NOTE — Telephone Encounter (Signed)
Patient scheduled for 7/9 for procedure, he needed a Monday.

## 2017-04-14 ENCOUNTER — Encounter: Payer: Self-pay | Admitting: Gastroenterology

## 2017-05-17 ENCOUNTER — Ambulatory Visit (AMBULATORY_SURGERY_CENTER): Payer: Self-pay

## 2017-05-17 ENCOUNTER — Encounter: Payer: Self-pay | Admitting: Gastroenterology

## 2017-05-17 VITALS — Ht 70.0 in | Wt 163.2 lb

## 2017-05-17 DIAGNOSIS — R1310 Dysphagia, unspecified: Secondary | ICD-10-CM

## 2017-05-17 NOTE — Progress Notes (Signed)
Denies allergies to eggs or soy products. Denies complication of anesthesia or sedation. Denies use of weight loss medication. Denies use of O2.   Emmi instructions given for colonoscopy.  

## 2017-05-29 ENCOUNTER — Encounter: Payer: Self-pay | Admitting: Gastroenterology

## 2017-05-29 ENCOUNTER — Ambulatory Visit (AMBULATORY_SURGERY_CENTER): Payer: PRIVATE HEALTH INSURANCE | Admitting: Gastroenterology

## 2017-05-29 ENCOUNTER — Telehealth: Payer: Self-pay | Admitting: Gastroenterology

## 2017-05-29 ENCOUNTER — Encounter: Payer: PRIVATE HEALTH INSURANCE | Admitting: Gastroenterology

## 2017-05-29 VITALS — BP 97/68 | HR 62 | Temp 97.8°F | Resp 13 | Ht 70.0 in | Wt 157.0 lb

## 2017-05-29 DIAGNOSIS — R1310 Dysphagia, unspecified: Secondary | ICD-10-CM | POA: Diagnosis present

## 2017-05-29 DIAGNOSIS — K222 Esophageal obstruction: Secondary | ICD-10-CM | POA: Diagnosis not present

## 2017-05-29 MED ORDER — METOCLOPRAMIDE HCL 5 MG PO TABS: 5.0000 mg | ORAL_TABLET | Freq: Three times a day (TID) | ORAL | 0 refills | Status: DC

## 2017-05-29 MED ORDER — SODIUM CHLORIDE 0.9 % IV SOLN: 500.0000 mL | INTRAVENOUS | Status: DC

## 2017-05-29 NOTE — Op Note (Signed)
[facility] Patient Name: Christopher [name] Procedure Date: 05/29/2017 10:38 AM MRN: [id number] Endoscopist: Mallie Mussel L. Loletha Carrow , MD Age: 66 Referring MD:  Date of Birth: 01-23-1951 Gender: Male Account #: [id number] Procedure:                Upper GI endoscopy Indications:              Dysphagia, For therapy of esophageal stenosis Medicines:                Monitored Anesthesia Care Procedure:                Pre-Anesthesia Assessment:                           - Prior to the procedure, a History and Physical                            was performed, and patient medications and                            allergies were reviewed. The patient's tolerance of                            previous anesthesia was also reviewed. The risks                            and benefits of the procedure and the sedation                            options and risks were discussed with the patient.                            All questions were answered, and informed consent                            was obtained. Prior Anticoagulants: The patient has                            taken no previous anticoagulant or antiplatelet                            agents. ASA Grade Assessment: III - A patient with                            severe systemic disease. After reviewing the risks                            and benefits, the patient was deemed in                            satisfactory condition to undergo the procedure.                           After obtaining informed consent, the endoscope was  passed under direct vision. Throughout the                            procedure, the patient's blood pressure, pulse, and                            oxygen saturations were monitored continuously. The                            Endoscope was introduced through the mouth, and                            advanced to the second part of duodenum. The upper                            GI  endoscopy was accomplished without difficulty.                            The patient tolerated the procedure well. Scope In: Scope Out: Findings:                 The larynx was normal.                           LA Grade D (one or more mucosal breaks involving at                            least 75% of esophageal circumference) esophagitis                            with no bleeding was found in the lower third of                            the esophagus.                           One severe (stenosis; an endoscope cannot pass)                            benign-appearing, intrinsic stenosis was found in                            the lower third of the esophagus. This measured 6                            mm (inner diameter) x 3 cm (in length) and was                            traversed after dilation. A TTS dilator was passed                            through the scope. Dilation with an 10-02-12 mm  balloon dilator was performed to 13 mm. The                            dilation site was examined and showed moderate                            improvement in luminal narrowing.                           A medium amount of food (residue) was found in the                            gastric body. Very little motiity was seen. This                            raises the suspicion of gastroparesis.                           The cardia and gastric fundus were normal on                            retroflexion.                           The examined duodenum was normal. Complications:            No immediate complications. Estimated Blood Loss:     Estimated blood loss was minimal. Impression:               - Normal larynx.                           - LA Grade D reflux esophagitis.                           - Benign-appearing esophageal stenosis. Dilated.                           - A medium amount of food (residue) in the stomach.                           - Normal  examined duodenum.                           - No specimens collected. Recommendation:           - Patient has a contact number available for                            emergencies. The signs and symptoms of potential                            delayed complications were discussed with the                            patient. Return to normal activities tomorrow.  Written discharge instructions were provided to the                            patient.                           - Soft diet.                           - Continue present medications.                           - Use metoclopramide 5 mg. One tablet by mouth PO                            QID; 30 min AC and HS for 4 weeks. Disp #120, RF                            zero                           - Follow an antireflux regimen indefinitely. Christopher [name] L. Loletha Carrow, MD 05/29/2017 11:08:48 AM This report has been signed electronically.

## 2017-05-29 NOTE — Progress Notes (Signed)
To PACU, vss patent aw report to rn 

## 2017-05-29 NOTE — Telephone Encounter (Signed)
Pt wanted to know if it would be okay if he tried probiotic. Advised he could take this and if no improvement to stop taking it. Advised if helps please notify Dr. Loletha Carrow.

## 2017-05-29 NOTE — Progress Notes (Signed)
Called to room to assist during endoscopic procedure.  Patient ID and intended procedure confirmed with present staff. Received instructions for my participation in the procedure from the performing physician.  

## 2017-05-29 NOTE — Telephone Encounter (Signed)
Please make sure Christopher [name] was able to get his metoclopramide as ordered, and he also needs to be scheduled for an EGD with balloon dilation in the [place] in 3-4 weeks.

## 2017-05-29 NOTE — Patient Instructions (Signed)
YOU HAD AN ENDOSCOPIC PROCEDURE TODAY AT [facility]:   Refer to the procedure report that was given to you for any specific questions about what was found during the examination.  If the procedure report does not answer your questions, please call your gastroenterologist to clarify.  If you requested that your care partner not be given the details of your procedure findings, then the procedure report has been included in a sealed envelope for you to review at your convenience later.  YOU SHOULD EXPECT: Some feelings of bloating in the abdomen. Passage of more gas than usual.  Walking can help get rid of the air that was put into your GI tract during the procedure and reduce the bloating. If you had a lower endoscopy (such as a colonoscopy or flexible sigmoidoscopy) you may notice spotting of blood in your stool or on the toilet paper. If you underwent a bowel prep for your procedure, you may not have a normal bowel movement for a few days.  Please Note:  You might notice some irritation and congestion in your nose or some drainage.  This is from the oxygen used during your procedure.  There is no need for concern and it should clear up in a day or so.  SYMPTOMS TO REPORT IMMEDIATELY:   Following upper endoscopy (EGD)  Vomiting of blood or coffee ground material  New chest pain or pain under the shoulder blades  Painful or persistently difficult swallowing  New shortness of breath  Fever of 100F or higher  Black, tarry-looking stools  For urgent or emergent issues, a gastroenterologist can be reached at any hour by calling [phone].   DIET:  Follow Post Esophageal Dilation Diet.  Drink plenty of fluids but you should avoid alcoholic beverages for 24 hours.  ACTIVITY:  You should plan to take it easy for the rest of today and you should NOT DRIVE or use heavy machinery until tomorrow (because of the sedation medicines used during the test).    FOLLOW UP: Our staff will  call the number listed on your records the next business day following your procedure to check on you and address any questions or concerns that you may have regarding the information given to you following your procedure. If we do not reach you, we will leave a message.  However, if you are feeling well and you are not experiencing any problems, there is no need to return our call.  We will assume that you have returned to your regular daily activities without incident.  If any biopsies were taken you will be contacted by phone or by letter within the next 1-3 weeks.  Please call us at [phone] if you have not heard about the biopsies in 3 weeks.   Soft Diet Follow antireflux regimen indefinitely Reflux (handout given) Post Esophageal Dilation Diet (handout given) Esophagitis and Stricture (handout given)    SIGNATURES/CONFIDENTIALITY: You and/or your care partner have signed paperwork which will be entered into your electronic medical record.  These signatures attest to the fact that that the information above on your After Visit Summary has been reviewed and is understood.  Full responsibility of the confidentiality of this discharge information lies with you and/or your care-partner.

## 2017-05-29 NOTE — Progress Notes (Signed)
Pt's states no medical or surgical changes since previsit or office visit. 

## 2017-05-30 ENCOUNTER — Telehealth: Payer: Self-pay

## 2017-05-30 ENCOUNTER — Other Ambulatory Visit: Payer: Self-pay

## 2017-05-30 ENCOUNTER — Telehealth: Payer: Self-pay | Admitting: Gastroenterology

## 2017-05-30 DIAGNOSIS — K2100 Gastro-esophageal reflux disease with esophagitis, without bleeding: Secondary | ICD-10-CM

## 2017-05-30 DIAGNOSIS — K222 Esophageal obstruction: Secondary | ICD-10-CM

## 2017-05-30 DIAGNOSIS — R1310 Dysphagia, unspecified: Secondary | ICD-10-CM

## 2017-05-30 DIAGNOSIS — C099 Malignant neoplasm of tonsil, unspecified: Secondary | ICD-10-CM

## 2017-05-30 DIAGNOSIS — K30 Functional dyspepsia: Secondary | ICD-10-CM

## 2017-05-30 DIAGNOSIS — K449 Diaphragmatic hernia without obstruction or gangrene: Secondary | ICD-10-CM

## 2017-05-30 NOTE — Telephone Encounter (Signed)
  Follow up Call-  Call back number 05/29/2017 04/10/2017  Post procedure Call Back phone  # [phone]  Permission to leave phone message Yes Yes  Some recent data might be hidden     Left message

## 2017-05-30 NOTE — Telephone Encounter (Signed)
Contacted Mr Kahre. He did pick up his medication from the pharmacy with no problems. Scheduled repeat EGD for 07-03-2017 @ 10 am. This is not within the 3-4 week follow up as requested. Pt requests a Monday due to transportation. 07-03-2017 was the first next available Monday. He also would like a referral to see a dietician. request has been placed with [facility] for the dietician. Pt will come to the office to pick up instruction packet and sign the procedure consent form.

## 2017-05-30 NOTE — Telephone Encounter (Signed)
Patient requesting a consult to nutrition. Ok to place? Please advise diet you would like him on

## 2017-05-30 NOTE — Telephone Encounter (Signed)
Pt has questions about his Endo and about his food digesting.  Wants to know if there is something that can prescribed that can help his food digest faster

## 2017-05-30 NOTE — Telephone Encounter (Signed)
I explained to the patient that his stomach is not emptying well and that the metoclopramide that was prescribed yesterday is to help with that.  He is aware that I will call him back about nutrition consult once I hear from Dr. Loletha Carrow

## 2017-05-30 NOTE — Telephone Encounter (Signed)
  Follow up Call-  Call back number 05/29/2017 04/10/2017  Post procedure Call Back phone  # [phone]  Permission to leave phone message Yes Yes  Some recent data might be hidden     Patient questions:  Do you have a fever, pain , or abdominal swelling? No. Pain Score  1 *  Have you tolerated food without any problems? No.  Have you been able to return to your normal activities? No.  Do you have any questions about your discharge instructions: Diet   No. Medications  No. Follow up visit  No.  Do you have questions or concerns about your Care? No.  Actions: * If pain score is 4 or above: No action needed, pain <4.

## 2017-06-01 NOTE — Telephone Encounter (Signed)
Referral order placed for nutrition referral.  I left a message for the patient for the patient that he can call [phone] to schedule an appt at his convenience.

## 2017-06-01 NOTE — Telephone Encounter (Signed)
Yes, my MA Vivien Rota had asked me if dietician was OK and I agreed.   The metoclopramide is a brief trial for a month or two in order to see if it helps empty stomach better, decrease reflux and help heal stricture.

## 2017-06-05 ENCOUNTER — Encounter: Payer: Self-pay | Admitting: Dietician

## 2017-06-05 ENCOUNTER — Encounter: Payer: PRIVATE HEALTH INSURANCE | Attending: Gastroenterology | Admitting: Dietician

## 2017-06-05 DIAGNOSIS — R1310 Dysphagia, unspecified: Secondary | ICD-10-CM | POA: Diagnosis not present

## 2017-06-05 DIAGNOSIS — K449 Diaphragmatic hernia without obstruction or gangrene: Secondary | ICD-10-CM | POA: Diagnosis not present

## 2017-06-05 DIAGNOSIS — K210 Gastro-esophageal reflux disease with esophagitis: Secondary | ICD-10-CM | POA: Diagnosis not present

## 2017-06-05 DIAGNOSIS — K3184 Gastroparesis: Secondary | ICD-10-CM

## 2017-06-05 DIAGNOSIS — K222 Esophageal obstruction: Secondary | ICD-10-CM | POA: Insufficient documentation

## 2017-06-05 DIAGNOSIS — Z713 Dietary counseling and surveillance: Secondary | ICD-10-CM | POA: Insufficient documentation

## 2017-06-05 DIAGNOSIS — K30 Functional dyspepsia: Secondary | ICD-10-CM | POA: Insufficient documentation

## 2017-06-05 DIAGNOSIS — C099 Malignant neoplasm of tonsil, unspecified: Secondary | ICD-10-CM | POA: Diagnosis not present

## 2017-06-05 DIAGNOSIS — K219 Gastro-esophageal reflux disease without esophagitis: Secondary | ICD-10-CM

## 2017-06-05 NOTE — Patient Instructions (Signed)
Personalize your diet for what you can tolerate. Be sure to eat enough to maintain your weight. Protein with each meal (keifer, cottage cheese, 1% milk, yogurt, scrambled eggs, very soft lean meat). Lean and low fiber generally especially if not well tolerated. If you are having more problems with digestion or swallowing you may need pureed foods or change to liquids and discuss with your doctor. Continue supplements such as NIKE, Boost or Ensure if unable to tolerate other foods.  See the Reflux sheet for other tips.

## 2017-06-05 NOTE — Progress Notes (Signed)
  Medical Nutrition Therapy:  Appt start time: 1000 end time:  1100.   Assessment:  Primary concerns today: Patient is here today with his husband.  He was recently diagnosed with gastroparesis and an esophageal stricture after an endoscopy.  Other hx includes HIV, hyperlipidemia, HTN, hypothyroidism, history of throat cancer, GERD, constipation.  He was told to follow a soft diet and wants to know what to eat with the gastroparesis and his partner's diabetes.  They cook the meats and vegetables until very soft.  Weight today 163 lbs which is his current UBW.  175 lbs 7 years ago prior to throat cancer.  Patient lives with his husband.  He is retired Futures trader. He does the cooking.  He is cooking foods very well done.  He can only chew on the left side of his mouth and takes increased time to eat.  Symptoms of choking and vomiting has improved since his esophagus was dilated.  He complained of constipation but states that the reglan has helped and he is tolerating foods well.  Preferred Learning Style:   No preference indicated   Learning Readiness:   Ready  Change in progress   MEDICATIONS: see list   DIETARY INTAKE:  Usual eating pattern includes 3 meals and 2 snacks per day.  24-hr recall:  B ( AM): smoothie (banana, peaches, berries, 2% milk or keifer or yogurt and ice) OR egg sandwich OR Hardies sausage and egg biscuit OR oatmeal or grits OR Pimento cheese toast AND coffee with creamer and sugar Snk ( AM): occasional spoon of peanut butter or peanut butter cracker or Mars bar  L ( PM): sandwich or bowl of beans or soup Snk ( PM): occasional oatmeal cookie or peanut butter or peanut butter crackers (NABS) D ( PM): salad with shrimp or chicken OR pot roast, corn on the cob, vegetables, legumes Snk ( PM): chocolate ice cream Beverages: coffee with creamer and sugar, sparkling water, occasional coke Usual physical activity: gardening, yard work, shopping  Estimated energy  needs: 2000 calories 85 g protein 40-50 g fat  Progress Towards Goal(s):  In progress.   Nutritional Diagnosis:  NB-1.1 Food and nutrition-related knowledge deficit As related to gastroparesis.  As evidenced by patient report.    Intervention:  Nutrition counseling/educastion related to GERD and gastroparesis.  Discussed diet based on tolerance.  Recommendations for lower fat, lower fiber, soft foods and avoid all foods that are not tolerated.  Supplement as needed.  Downgrade diet to pureed or liquid if solids are not tolerated.  Personalize your diet for what you can tolerate. Be sure to eat enough to maintain your weight. Protein with each meal (keifer, cottage cheese, 1% milk, yogurt, scrambled eggs, very soft lean meat). Lean and low fiber generally especially if not well tolerated. If you are having more problems with digestion or swallowing you may need pureed foods or change to liquids and discuss with your doctor. Continue supplements such as NIKE, Boost or Ensure if unable to tolerate other foods.  See the Reflux sheet for other tips.   Teaching Method Utilized:  Visual Auditory Hands on  Handouts given during visit include:  GERD nutrition therapy from AND  Gastroparesis nutrition therapy from AND  Barriers to learning/adherence to lifestyle change: none  Demonstrated degree of understanding via:  Teach Back   Monitoring/Evaluation:  Dietary intake, exercise, and body weight prn.

## 2017-06-06 ENCOUNTER — Encounter: Payer: PRIVATE HEALTH INSURANCE | Admitting: Gastroenterology

## 2017-06-27 ENCOUNTER — Encounter: Payer: Self-pay | Admitting: Gastroenterology

## 2017-07-03 ENCOUNTER — Other Ambulatory Visit: Payer: Self-pay | Admitting: Gastroenterology

## 2017-07-03 ENCOUNTER — Ambulatory Visit (AMBULATORY_SURGERY_CENTER): Payer: PRIVATE HEALTH INSURANCE | Admitting: Gastroenterology

## 2017-07-03 ENCOUNTER — Encounter: Payer: Self-pay | Admitting: Gastroenterology

## 2017-07-03 VITALS — BP 100/52 | HR 67 | Temp 98.4°F | Resp 14 | Ht 70.0 in | Wt 157.0 lb

## 2017-07-03 DIAGNOSIS — K222 Esophageal obstruction: Secondary | ICD-10-CM | POA: Diagnosis not present

## 2017-07-03 DIAGNOSIS — R1310 Dysphagia, unspecified: Secondary | ICD-10-CM | POA: Diagnosis not present

## 2017-07-03 DIAGNOSIS — K210 Gastro-esophageal reflux disease with esophagitis: Secondary | ICD-10-CM | POA: Diagnosis not present

## 2017-07-03 DIAGNOSIS — K219 Gastro-esophageal reflux disease without esophagitis: Secondary | ICD-10-CM | POA: Diagnosis not present

## 2017-07-03 MED ORDER — METOCLOPRAMIDE HCL 5 MG PO TABS: 5.0000 mg | ORAL_TABLET | Freq: Three times a day (TID) | ORAL | 2 refills | Status: DC

## 2017-07-03 MED ORDER — SODIUM CHLORIDE 0.9 % IV SOLN: 500.0000 mL | INTRAVENOUS | Status: DC

## 2017-07-03 NOTE — Progress Notes (Signed)
Called to room to assist during endoscopic procedure.  Patient ID and intended procedure confirmed with present staff. Received instructions for my participation in the procedure from the performing physician.  

## 2017-07-03 NOTE — Op Note (Signed)
[facility] Patient Name: Christopher [name] Procedure Date: 07/03/2017 9:59 AM MRN: [id number] Endoscopist: Mallie Mussel L. Loletha Carrow , MD Age: 66 Referring MD:  Date of Birth: 14-Aug-1951 Gender: Male Account #: [id number] Procedure:                Upper GI endoscopy Indications:              Dysphagia, For therapy of esophageal stricture Medicines:                Monitored Anesthesia Care Procedure:                Pre-Anesthesia Assessment:                           - Prior to the procedure, a History and Physical                            was performed, and patient medications and                            allergies were reviewed. The patient's tolerance of                            previous anesthesia was also reviewed. The risks                            and benefits of the procedure and the sedation                            options and risks were discussed with the patient.                            All questions were answered, and informed consent                            was obtained. Prior Anticoagulants: The patient has                            taken no previous anticoagulant or antiplatelet                            agents. ASA Grade Assessment: III - A patient with                            severe systemic disease. After reviewing the risks                            and benefits, the patient was deemed in                            satisfactory condition to undergo the procedure.                           After obtaining informed consent, the endoscope was  passed under direct vision. Throughout the                            procedure, the patient's blood pressure, pulse, and                            oxygen saturations were monitored continuously. The                            Model GIF-[id number] scope was introduced                            through the mouth, and advanced to the second part                            of  duodenum. The upper GI endoscopy was                            accomplished without difficulty. The patient                            tolerated the procedure well. Scope In: Scope Out: Findings:                 A small hiatal hernia was present.                           One tongue of salmon-colored mucosa was present at                            38 cm. No other visible abnormalities were present.                            The maximum longitudinal extent of these esophageal                            mucosal changes was 1 cm in length. Biopsies were                            taken with a cold forceps for histology.                           One moderate (circumferential scarring or stenosis;                            an endoscope may pass) benign-appearing, intrinsic                            stenosis was found in the distal esophagus. This                            measured 8 mm (inner diameter) x 2 cm (in length)  and was traversed with mild scope pressure. A TTS                            dilator was passed through the scope. Dilation with                            a 13.5-14.5-15.5 mm balloon dilator was performed                            to 15.5 mm. The dilation site was examined and                            showed moderate improvement in luminal narrowing.                           The stomach was normal. There was no retained food                            in the stomach as there had been on prior EGD.                           The cardia and gastric fundus were normal on                            retroflexion.                           The examined duodenum was normal. Complications:            No immediate complications. Estimated Blood Loss:     Estimated blood loss was minimal. Impression:               - Small hiatal hernia.                           - Salmon-colored mucosa suspicious for                            short-segment  Barrett's esophagus. Biopsied.                           - Benign-appearing esophageal stenosis. Dilated.                           - Normal stomach.                           - Normal examined duodenum. Recommendation:           - Patient has a contact number available for                            emergencies. The signs and symptoms of potential                            delayed complications were discussed with the  patient. Return to normal activities tomorrow.                            Written discharge instructions were provided to the                            patient.                           - Resume previous diet.                           - Continue present medications.                           - Await pathology results.                           - Return to my office in about 4 weeks. [name], MD 07/03/2017 10:34:45 AM This report has been signed electronically.

## 2017-07-03 NOTE — Progress Notes (Signed)
  [facility] Post-op Note  Patient: Christopher [name]  Procedure(s) Performed: endoscopy with dilatation  Patient Location: [facility] - Recovery Area  Anesthesia Type: Deep Sedation/Propofol  Level of Consciousness: awake, oriented and patient cooperative  Airway and Oxygen Therapy: Patient Spontanous Breathing  Post-op Pain: none  Post-op Assessment:  Post-op Vital signs reviewed, Patient's Cardiovascular Status Stable, Respiratory Function Stable, Patent Airway, No signs of Nausea or vomiting and Pain level controlled  Post-op Vital Signs: Reviewed and stable  Complications: No apparent anesthesia complications  Sebastian Dzik E Ivoree Felmlee 10:33 AM

## 2017-07-03 NOTE — Patient Instructions (Addendum)
YOU HAD AN ENDOSCOPIC PROCEDURE TODAY AT [facility]:   Refer to the procedure report that was given to you for any specific questions about what was found during the examination.  If the procedure report does not answer your questions, please call your gastroenterologist to clarify.  If you requested that your care partner not be given the details of your procedure findings, then the procedure report has been included in a sealed envelope for you to review at your convenience later.  YOU SHOULD EXPECT: Some feelings of bloating in the abdomen. Passage of more gas than usual.  Walking can help get rid of the air that was put into your GI tract during the procedure and reduce the bloating.   Please Note:  You might notice some irritation and congestion in your nose or some drainage.  This is from the oxygen used during your procedure.  There is no need for concern and it should clear up in a day or so.  SYMPTOMS TO REPORT IMMEDIATELY:    Following upper endoscopy (EGD)  Vomiting of blood or coffee ground material  New chest pain or pain under the shoulder blades  Painful or persistently difficult swallowing  New shortness of breath  Fever of 100F or higher  Black, tarry-looking stools  For urgent or emergent issues, a gastroenterologist can be reached at any hour by calling [phone].   DIET:  Just clear liquids until 11am, and then proceed to a soft diet for the rest of today.  You may have a regular diet tomorrow. Drink plenty of fluids but you should avoid alcoholic beverages for 24 hours. Try to eat a lot of protein when you start your regular diet.  ACTIVITY:  You should plan to take it easy for the rest of today and you should NOT DRIVE or use heavy machinery until tomorrow (because of the sedation medicines used during the test).    FOLLOW UP: Our staff will call the number listed on your records the next business day following your procedure to check on you and  address any questions or concerns that you may have regarding the information given to you following your procedure. If we do not reach you, we will leave a message.  However, if you are feeling well and you are not experiencing any problems, there is no need to return our call.  We will assume that you have returned to your regular daily activities without incident.  If any biopsies were taken you will be contacted by phone or by letter within the next 1-3 weeks.  Please call us at [phone] if you have not heard about the biopsies in 3 weeks.    SIGNATURES/CONFIDENTIALITY: You and/or your care partner have signed paperwork which will be entered into your electronic medical record.  These signatures attest to the fact that that the information above on your After Visit Summary has been reviewed and is understood.  Full responsibility of the confidentiality of this discharge information lies with you and/or your care-partner.

## 2017-07-04 ENCOUNTER — Telehealth: Payer: Self-pay | Admitting: *Deleted

## 2017-07-04 NOTE — Telephone Encounter (Signed)
  Follow up Call-  Call back number 07/03/2017 05/29/2017 04/10/2017  Post procedure Call Back phone  # [phone]  Permission to leave phone message Yes Yes Yes  Some recent data might be hidden     Patient questions:  Do you have a fever, pain , or abdominal swelling? No. Pain Score  0 *  Have you tolerated food without any problems? Yes.    Have you been able to return to your normal activities? Yes.    Do you have any questions about your discharge instructions: Diet   No. Medications  No. Follow up visit  No.  Do you have questions or concerns about your Care? No.  Actions: * If pain score is 4 or above: No action needed, pain <4.

## 2017-07-04 NOTE — Telephone Encounter (Signed)
Message left

## 2017-08-03 ENCOUNTER — Encounter: Payer: Self-pay | Admitting: Internal Medicine

## 2017-08-03 ENCOUNTER — Ambulatory Visit (INDEPENDENT_AMBULATORY_CARE_PROVIDER_SITE_OTHER): Payer: PRIVATE HEALTH INSURANCE | Admitting: Internal Medicine

## 2017-08-03 DIAGNOSIS — Z23 Encounter for immunization: Secondary | ICD-10-CM

## 2017-08-03 DIAGNOSIS — I10 Essential (primary) hypertension: Secondary | ICD-10-CM | POA: Diagnosis not present

## 2017-08-03 DIAGNOSIS — B20 Human immunodeficiency virus [HIV] disease: Secondary | ICD-10-CM | POA: Diagnosis not present

## 2017-08-03 DIAGNOSIS — F3340 Major depressive disorder, recurrent, in remission, unspecified: Secondary | ICD-10-CM | POA: Diagnosis not present

## 2017-08-03 NOTE — Assessment & Plan Note (Signed)
His depression remains in remission. 

## 2017-08-03 NOTE — Assessment & Plan Note (Signed)
His blood pressure remains in goal range even after stopping benazepril.

## 2017-08-03 NOTE — Progress Notes (Signed)
Patient Active Problem List   Diagnosis Date Noted  . Peripheral neuropathy 09/30/2013    Priority: High  . Human immunodeficiency virus (HIV) disease ([place]) 12/01/2006    Priority: High  . Depression 10/01/2013    Priority: Medium  . Renal insufficiency 08/19/2013    Priority: Medium  . 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 Oral cancer ([place]) 08/18/2008    Priority: Medium  . Hypothyroidism 11/01/2012  . Hyperglycemia 12/08/2011  . HYPOGONADISM 12/23/2009  . Dyslipidemia 01/03/2008  . SYPHILIS, [place], LATENT NOS 01/18/2007  . Essential hypertension 12/01/2006  . PROTEINURIA 12/01/2006    Patient's Medications  New Prescriptions   No medications on file  Previous Medications   CHLORHEXIDINE (PERIDEX) 0.12 % SOLUTION    Use as directed 15 mLs in the mouth or throat 2 (two) times daily.   CLONAZEPAM (KLONOPIN) 1 MG TABLET    TAKE 1 TABLET 3 TIMES A DAY AS NEEDED FOR ANXIETY   FENOFIBRATE (TRICOR) 145 MG TABLET       FLUOXETINE (PROZAC) 20 MG CAPSULE    TAKE ONE CAPSULE EVERY DAY   LEVOTHYROXINE (SYNTHROID, LEVOTHROID) 50 MCG TABLET    Take 1 tablet (50 mcg total) by mouth daily before breakfast.   METOCLOPRAMIDE (REGLAN) 5 MG TABLET    Take 1 tablet (5 mg total) by mouth 4 (four) times daily -  before meals and at bedtime.   PANTOPRAZOLE (PROTONIX) 40 MG TABLET    Take 1 tablet by mouth every morning   TEMAZEPAM (RESTORIL) 30 MG CAPSULE    Take 30 mg by mouth at bedtime.   TRIUMEQ 600-50-300 MG TABLET    TAKE 1 TABLET BY MOUTH DAILY.  Modified Medications   No medications on file  Discontinued Medications   BENAZEPRIL (LOTENSIN) 10 MG TABLET    Take 10 mg by mouth daily.    Subjective: Christopher [name] is in for his routine HIV follow-up visit. He's had no problems obtaining, taking or tolerating his Triumeq. He has had some problems with dysphagia with solid foods and has recently undergone esophageal dilation. He has never had any problem swallowing  his Triumeq. He does not recall missing any doses. His primary care physician, Dr. Maudry Mayhew, recently took him off of benazepril because his blood pressure was lower. He does walk on a regular basis but does not get any other exercise. He says that over the past year he has noted that if he gets down on the ground he has difficulty getting himself back up if he is not close to some furniture he can grab onto.  Review of Systems: Review of Systems  Constitutional: Negative for chills, diaphoresis, fever, malaise/fatigue and weight loss.  HENT: Negative for sore throat.        Swallowing difficulty is noted in history of present illness.  Respiratory: Negative for cough, sputum production and shortness of breath.   Cardiovascular: Negative for chest pain.  Gastrointestinal: Positive for heartburn. Negative for abdominal pain, diarrhea, nausea and vomiting.  Genitourinary: Negative for dysuria and frequency.  Musculoskeletal: Negative for joint pain and myalgias.  Skin: Negative for rash.  Neurological: Positive for sensory change. Negative for dizziness, focal weakness and headaches.  Psychiatric/Behavioral: Negative for depression and substance abuse. The patient is not nervous/anxious.     Past Medical History:  Diagnosis Date  . Anxiety   . Cataract   . Chronic kidney disease   . GERD (  gastroesophageal reflux disease)   . Heart murmur   . HIV DISEASE 12/01/2006  . HYPERLIPIDEMIA, WITH LOW HDL 01/03/2008  . Hypertension   . HYPERTENSION 12/01/2006  . HYPOGONADISM 12/23/2009  . Neuromuscular disorder ([place])   . SYPHILIS, [place], LATENT NOS 01/18/2007  . Thyroid disease   . 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 Tuberculosis    history of TB about 30 years ago    Social History  Substance Use Topics  . Smoking status: Former Research scientist (life sciences)  . Smokeless tobacco: Never Used     Comment: quit when he was 66 years old.  . Alcohol use No     Comment: very rare     Family History    Problem Relation Age of Onset  . Diabetes Mother   . COPD Mother   . Heart attack Father   . Colon cancer Neg Hx   . Colon polyps Neg Hx   . Esophageal cancer Neg Hx   . Rectal cancer Neg Hx   . Stomach cancer Neg Hx     Allergies  Allergen Reactions  . Codeine   . Sulfamethoxazole-Trimethoprim   . Sulfonamide Derivatives     Objective:  Vitals:   08/03/17 0953  BP: 113/77  Pulse: 98  Temp: 98.4 F (36.9 C)  TempSrc: Oral  Weight: 170 lb (77.1 kg)   Body mass index is 24.39 kg/m.  Physical Exam  Constitutional: He is oriented to person, place, and time.  He is well dressed and in good spirits.  HENT:  Mouth/Throat: No oropharyngeal exudate.  Multiple missing teeth.  Eyes: Conjunctivae are normal.  Cardiovascular: Normal rate and regular rhythm.   No murmur heard. Pulmonary/Chest: Breath sounds normal.  Abdominal: Soft. He exhibits no mass. There is no tenderness.  Musculoskeletal: Normal range of motion. He exhibits no edema or tenderness.  Neurological: He is alert and oriented to person, place, and time.  Skin: No rash noted.  No change in lipoatrophy or central adiposity.  Psychiatric: Mood and affect normal.    Lab Results Lab Results  Component Value Date   WBC 4.1 07/18/2016   HGB 13.2 07/18/2016   HCT 39.4 07/18/2016   MCV 94.0 07/18/2016   PLT 235 07/18/2016    Lab Results  Component Value Date   CREATININE 1.50 (H) 07/18/2016   BUN 17 07/18/2016   NA 139 07/18/2016   K 4.3 07/18/2016   CL 104 07/18/2016   CO2 25 07/18/2016    Lab Results  Component Value Date   ALT 26 07/18/2016   AST 28 07/18/2016   ALKPHOS 43 07/18/2016   BILITOT 0.4 07/18/2016    Lab Results  Component Value Date   CHOL 225 (H) 01/27/2016   HDL 39 (L) 01/27/2016   LDLCALC NOT CALC 01/27/2016   TRIG 437 (H) 01/27/2016   CHOLHDL 5.8 (H) 01/27/2016   Lab Results  Component Value Date   LABRPR NON REAC 04/30/2015   RPRTITER 1:1 02/02/2012   HIV 1 RNA  Quant (copies/mL)  Date Value  07/18/2016 <20  01/27/2016 <20  04/30/2015 <20   CD4 T Cell Abs (/uL)  Date Value  07/18/2016 370 (L)  01/27/2016 330 (L)  04/30/2015 244 (L)     Problem List Items Addressed This Visit      High   Human immunodeficiency virus (HIV) disease ([place])    His HIV infection is under excellent, long-term control. He will continue Triumeq and follow-up after lab  work in one year.      Relevant Orders   CBC   T-helper cell (CD4)- ([facility] clinic only)   Comprehensive metabolic panel   Lipid panel   RPR   HIV 1 RNA quant-no reflex-bld     Medium   Depression    His depression remains in remission.        Unprioritized   Essential hypertension    His blood pressure remains in goal range even after stopping benazepril.           Michel Bickers, MD [facility] [place] [facility] [phone] pager   [phone] cell 08/03/2017, 10:18 AM

## 2017-08-03 NOTE — Assessment & Plan Note (Signed)
His HIV infection is under excellent, long-term control. He will continue Triumeq and follow-up after lab work in one year.

## 2017-08-10 ENCOUNTER — Encounter: Payer: Self-pay | Admitting: Gastroenterology

## 2017-08-10 ENCOUNTER — Ambulatory Visit (INDEPENDENT_AMBULATORY_CARE_PROVIDER_SITE_OTHER): Payer: PRIVATE HEALTH INSURANCE | Admitting: Gastroenterology

## 2017-08-10 VITALS — BP 120/70 | HR 80 | Ht 70.0 in | Wt 165.0 lb

## 2017-08-10 DIAGNOSIS — K30 Functional dyspepsia: Secondary | ICD-10-CM

## 2017-08-10 DIAGNOSIS — K222 Esophageal obstruction: Secondary | ICD-10-CM | POA: Diagnosis not present

## 2017-08-10 DIAGNOSIS — R1310 Dysphagia, unspecified: Secondary | ICD-10-CM | POA: Diagnosis not present

## 2017-08-10 DIAGNOSIS — K219 Gastro-esophageal reflux disease without esophagitis: Secondary | ICD-10-CM | POA: Diagnosis not present

## 2017-08-10 NOTE — Patient Instructions (Addendum)
Please check your meds at home.  I would like you to continue the pantoprazole 40 mg once every morning with AM meal.  If you are still taking the over the counter omeprazole in the evening, that can be stopped.  Continue the metoclopramide at current dose.  See me in 3 months, but call sooner if needed, We will put you on the recall list.  If you are age 67 or older, your body mass index should be between 23-30. Your Body mass index is 23.68 kg/m. If this is out of the aforementioned range listed, please consider follow up with your Primary Care Provider.  If you are age 43 or younger, your body mass index should be between 19-25. Your Body mass index is 23.68 kg/m. If this is out of the aformentioned range listed, please consider follow up with your Primary Care Provider.   Thank you for choosing [facility]  Dr Wilfrid Lund III

## 2017-08-10 NOTE — Progress Notes (Signed)
[facility] Progress Note  Chief Complaint: Dysphagia  Subjective  History:  Christopher [name] follows up after his recent upper endoscopy with balloon dilation of a distal stricture. On the exam prior to that he had severe reflux esophagitis with retained gastric food. He has been on metoclopramide 5 mg 4 times daily for about the last 2 months and has a decrease in nausea, less regurgitation and heartburn and improved constipation. He is not having any side effects from the metoclopramide. I believe he is still taking pantoprazole 40 mg once daily and omeprazole OTC 20 mg every evening, but he will check his home meds to be certain. 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 However, he is able to consume a varied diet of soft consistency and maintain his weight.  ROS: Cardiovascular:  no chest pain Respiratory: no dyspnea  The patient's Past Medical, Family and Social History were reviewed and are on file in the EMR.  Objective:  Med list reviewed  Current Outpatient Prescriptions:  .  chlorhexidine (PERIDEX) 0.12 % solution, Use as directed 15 mLs in the mouth or throat 2 (two) times daily., Disp: , Rfl:  .  clonazePAM (KLONOPIN) 1 MG tablet, TAKE 1 TABLET 3 TIMES A DAY AS NEEDED FOR ANXIETY, Disp: 90 tablet, Rfl: 2 .  fenofibrate (TRICOR) 145 MG tablet, , Disp: , Rfl:  .  FLUoxetine (PROZAC) 20 MG capsule, TAKE ONE CAPSULE EVERY DAY, Disp: 90 capsule, Rfl: 2 .  levothyroxine (SYNTHROID, LEVOTHROID) 50 MCG tablet, Take 1 tablet (50 mcg total) by mouth daily before breakfast., Disp: 30 tablet, Rfl: 0 .  metoCLOPramide (REGLAN) 5 MG tablet, Take 1 tablet (5 mg total) by mouth 4 (four) times daily -  before meals and at bedtime., Disp: 120 tablet, Rfl: 2 .  pantoprazole (PROTONIX) 40 MG tablet, Take 1 tablet by mouth every morning, Disp: 90 tablet, Rfl: 3 .  temazepam (RESTORIL) 30 MG capsule, Take 30 mg by mouth at bedtime., Disp: , Rfl:  5 .  TRIUMEQ 600-50-300 MG tablet, TAKE 1 TABLET BY MOUTH DAILY., Disp: 30 tablet, Rfl: 3  Current Facility-Administered Medications:  .  0.9 %  sodium chloride infusion, 500 mL, Intravenous, Continuous, Danis, Henry L III, MD .  0.9 %  sodium chloride infusion, 500 mL, Intravenous, Continuous, Danis, Henry L III, MD .  0.9 %  sodium chloride infusion, 500 mL, Intravenous, Continuous, Danis, Estill Cotta III, MD  omeprazole OTC evening meal  Vital signs in last 24 hrs: Vitals:   08/10/17 0939  BP: 120/70  Pulse: 80    Physical Exam    HEENT: sclera anicteric, oral mucosa moist without lesions. Postsurgical changes of his face and neck as before  Neck: supple, no thyromegaly, JVD or lymphadenopathy  Cardiac: RRR without murmurs, S1S2 heard, no peripheral edema  Pulm: clear to auscultation bilaterally, normal RR and effort noted  Abdomen: soft, no tenderness, with active bowel sounds. No guarding or palpable hepatosplenomegaly.  Skin; warm and dry, no jaundice or rash  Recent Labs:  Esophageal biopsy negative for Barrett's esophagus   @ASSESSMENTPLANBEGIN @ Assessment: Encounter Diagnoses  Name Primary?  . Gastroesophageal reflux disease, esophagitis presence not specified Yes  . Esophageal stricture   . Delayed gastric emptying   . Dysphagia, unspecified type     His reflux and dysphagia symptoms are much improved from a few months ago. It seems the most recent larger diameter dilation was the most helpful yet.  He clearly had severe ulcerative reflux, probably worsened by delayed gastric emptying.  Plan: Continue metoclopramide 5 mg 4 times a day, since it is also decreased his nocturnal reflux symptoms. He is aware of the potential neurologic side effects, and will alert me if anything occurs. Continue once daily pantoprazole, discontinue the evening PPI dose if he is still taking that. Follow up with me in 3 months, and to call sooner as needed.  Total time 20  minutes, over half spent in counseling and coordination of care.   Nelida Meuse III

## 2017-10-04 ENCOUNTER — Other Ambulatory Visit: Payer: Self-pay | Admitting: Gastroenterology

## 2017-10-04 DIAGNOSIS — R1310 Dysphagia, unspecified: Secondary | ICD-10-CM

## 2017-10-04 DIAGNOSIS — K222 Esophageal obstruction: Secondary | ICD-10-CM

## 2017-10-11 ENCOUNTER — Other Ambulatory Visit: Payer: Self-pay | Admitting: Internal Medicine

## 2017-10-11 DIAGNOSIS — E785 Hyperlipidemia, unspecified: Secondary | ICD-10-CM

## 2017-11-18 ENCOUNTER — Other Ambulatory Visit: Payer: Self-pay | Admitting: Internal Medicine

## 2017-11-18 DIAGNOSIS — B20 Human immunodeficiency virus [HIV] disease: Secondary | ICD-10-CM

## 2017-11-24 ENCOUNTER — Encounter: Payer: Self-pay | Admitting: Gastroenterology

## 2017-12-26 ENCOUNTER — Telehealth: Payer: Self-pay

## 2017-12-26 NOTE — Telephone Encounter (Signed)
Received a fax from [facility] stating it was time to renew PA for Triumeq. When I called the PA advocate to initiate this process I was told it has already been submitted. It was submitted on 12/19/17 and that it was it the reviewing process. We should receive a fax with a decision within 24-72 hours. [place]

## 2017-12-28 NOTE — Telephone Encounter (Signed)
PA approved pharmacy was notified. [place]

## 2018-01-03 ENCOUNTER — Other Ambulatory Visit: Payer: Self-pay | Admitting: Gastroenterology

## 2018-01-03 DIAGNOSIS — R1310 Dysphagia, unspecified: Secondary | ICD-10-CM

## 2018-01-03 DIAGNOSIS — K222 Esophageal obstruction: Secondary | ICD-10-CM

## 2018-01-04 NOTE — Telephone Encounter (Signed)
Refill request for Reglan 5 mg 4 x a day. Last seen 08-10-2017.

## 2018-01-04 NOTE — Telephone Encounter (Signed)
I refilled his metoclopramide.  Please arrange office follow up with me in the next 2 months regarding the upper digestive symptoms.  I need to monitor his symptoms and possible side effects while on this medicine.

## 2018-01-05 NOTE — Telephone Encounter (Signed)
Follow up is scheduled for 01-26-2017.

## 2018-01-26 ENCOUNTER — Ambulatory Visit: Payer: PRIVATE HEALTH INSURANCE | Admitting: Gastroenterology

## 2018-01-26 ENCOUNTER — Encounter: Payer: Self-pay | Admitting: Gastroenterology

## 2018-01-26 VITALS — BP 118/70 | HR 82 | Ht 70.0 in | Wt 171.5 lb

## 2018-01-26 DIAGNOSIS — K2100 Gastro-esophageal reflux disease with esophagitis, without bleeding: Secondary | ICD-10-CM

## 2018-01-26 DIAGNOSIS — K3184 Gastroparesis: Secondary | ICD-10-CM

## 2018-01-26 DIAGNOSIS — K222 Esophageal obstruction: Secondary | ICD-10-CM

## 2018-01-26 DIAGNOSIS — R1312 Dysphagia, oropharyngeal phase: Secondary | ICD-10-CM | POA: Diagnosis not present

## 2018-01-26 DIAGNOSIS — R1310 Dysphagia, unspecified: Secondary | ICD-10-CM | POA: Diagnosis not present

## 2018-01-26 DIAGNOSIS — R1319 Other dysphagia: Secondary | ICD-10-CM

## 2018-01-26 NOTE — Progress Notes (Signed)
[facility] Progress Note  Chief Complaint: Heartburn and dysphagia  Subjective  History:  This is a 67 year old man known to me from previous evaluation of severe reflux with dysphagia.  He has oral pharyngeal dysphagia from treatment of previous head and neck cancer, but also an esophageal dysphagia from reflux related stricture requiring balloon dilation last year.  He also appears to have gastroparesis based on retained food at the time of upper endoscopy, currently on treatment with metoclopramide.  Overall, Christopher [name] has been doing well since his last visit with me.  He still has oral pharyngeal dysphagia, rarely has esophageal dysphagia as long as he is careful about his food intake.  He denies heartburn, has had no nausea or vomiting.  He is having no neurological or other side effects from the metoclopramide.  I again reviewed the possibility of neurologic side effects including tardive dyskinesia, and he expressed understanding. ROS: Cardiovascular:  no chest pain Respiratory: no dyspnea  The patient's Past Medical, Family and Social History were reviewed and are on file in the EMR.  Objective:  Med list reviewed  Current Outpatient Medications:  .  chlorhexidine (PERIDEX) 0.12 % solution, Use as directed 15 mLs in the mouth or throat 2 (two) times daily., Disp: , Rfl:  .  clonazePAM (KLONOPIN) 1 MG tablet, TAKE 1 TABLET 3 TIMES A DAY AS NEEDED FOR ANXIETY, Disp: 90 tablet, Rfl: 2 .  fenofibrate (TRICOR) 145 MG tablet, TAKE 1 TABLET EVERY DAY, Disp: 30 tablet, Rfl: 5 .  FLUoxetine (PROZAC) 20 MG capsule, TAKE ONE CAPSULE EVERY DAY, Disp: 90 capsule, Rfl: 2 .  levothyroxine (SYNTHROID, LEVOTHROID) 50 MCG tablet, Take 1 tablet (50 mcg total) by mouth daily before breakfast., Disp: 30 tablet, Rfl: 0 .  metoCLOPramide (REGLAN) 5 MG tablet, TAKE 1 TABLET 4 TIMES A DAY BEFORE MEALS AND AT BEDTIME, Disp: 120 tablet, Rfl: 2 .  pantoprazole (PROTONIX) 40 MG tablet, Take 1  tablet by mouth every morning, Disp: 90 tablet, Rfl: 3 .  temazepam (RESTORIL) 30 MG capsule, Take 30 mg by mouth at bedtime., Disp: , Rfl: 5 .  TRIUMEQ 600-50-300 MG tablet, TAKE 1 TABLET BY MOUTH DAILY., Disp: 30 tablet, Rfl: 3 .  TRIUMEQ 600-50-300 MG tablet, TAKE 1 TABLET BY MOUTH EVERY DAY, Disp: 30 tablet, Rfl: 3 .  [DISCONTINUED] Dolutegravir Sodium (TIVICAY) 50 MG TABS, Take 1 tablet (50 mg total) by mouth daily., Disp: 30 tablet, Rfl: 11  Current Facility-Administered Medications:  .  0.9 %  sodium chloride infusion, 500 mL, Intravenous, Continuous, Danis, Sylvan Lahm L III, MD .  0.9 %  sodium chloride infusion, 500 mL, Intravenous, Continuous, Danis, Zaidy Absher L III, MD .  0.9 %  sodium chloride infusion, 500 mL, Intravenous, Continuous, Danis, Estill Cotta III, MD   Vital signs in last 24 hrs: Vitals:   01/26/18 1412  BP: 118/70  Pulse: 82    Physical Exam   he is well-appearing, thin as always  HEENT: sclera anicteric, oral mucosa moist without lesions.  Limited range of oral opening, postsurgical changes right-sided neck  Neck: supple, no thyromegaly, JVD or lymphadenopathy  Cardiac: RRR without murmurs, S1S2 heard, no peripheral edema  Pulm: clear to auscultation bilaterally, normal RR and effort noted  Abdomen: soft, no tenderness, with active bowel sounds. No guarding or palpable hepatosplenomegaly.  Skin; warm and dry, no jaundice or rash    @ASSESSMENTPLANBEGIN @ Assessment: Encounter Diagnoses  Name Primary?  . Esophageal dysphagia Yes  . Oropharyngeal  dysphagia   . Gastroparesis   . Esophageal stricture   . Gastroesophageal reflux disease with esophagitis    He is doing well from the standpoint of both dysphagia and GERD.  He almost certainly still has an esophageal stricture, but he has accommodated to that with soft diet.  Plan: Continue current medical management. He knows to call me if the esophageal dysphagia is worsening with food frequently feeling stuck  in the chest.  If that happens, he will need a repeat upper endoscopy with stricture dilation. Otherwise he will follow-up in 6 months or sooner as needed.    Total time 20 minutes, over half spent in counseling and coordination of care.   Nelida Meuse III

## 2018-01-26 NOTE — Patient Instructions (Signed)
If you are age 68 or older, your body mass index should be between 23-30. Your Body mass index is 24.61 kg/m. If this is out of the aforementioned range listed, please consider follow up with your Primary Care Provider.  If you are age 91 or younger, your body mass index should be between 19-25. Your Body mass index is 24.61 kg/m. If this is out of the aformentioned range listed, please consider follow up with your Primary Care Provider.   Follow up in 6 months, or call us as needed. [phone]  Thank you for choosing [facility]  Dr Wilfrid Lund III

## 2018-03-01 ENCOUNTER — Other Ambulatory Visit: Payer: Self-pay | Admitting: Internal Medicine

## 2018-03-01 DIAGNOSIS — E785 Hyperlipidemia, unspecified: Secondary | ICD-10-CM

## 2018-03-01 DIAGNOSIS — B20 Human immunodeficiency virus [HIV] disease: Secondary | ICD-10-CM

## 2018-03-19 ENCOUNTER — Other Ambulatory Visit: Payer: Self-pay | Admitting: Physician Assistant

## 2018-03-20 NOTE — Telephone Encounter (Signed)
Refill request for Protonix 40 mg.  Originally saw Christopher [name] on 04-06-17. You did an EGD with [facility] on 07-20-2018.  You last saw the patient 01-27-2018.  You noted that this patient should continue the Protonix 40 mg once daily.  Would you want to refill this prescription?

## 2018-03-29 ENCOUNTER — Other Ambulatory Visit: Payer: Self-pay | Admitting: Gastroenterology

## 2018-03-29 DIAGNOSIS — R1310 Dysphagia, unspecified: Secondary | ICD-10-CM

## 2018-03-29 DIAGNOSIS — K222 Esophageal obstruction: Secondary | ICD-10-CM

## 2018-04-05 ENCOUNTER — Other Ambulatory Visit: Payer: Self-pay | Admitting: Internal Medicine

## 2018-04-05 DIAGNOSIS — G629 Polyneuropathy, unspecified: Secondary | ICD-10-CM

## 2018-06-17 ENCOUNTER — Other Ambulatory Visit: Payer: Self-pay | Admitting: Internal Medicine

## 2018-06-17 DIAGNOSIS — E785 Hyperlipidemia, unspecified: Secondary | ICD-10-CM

## 2018-06-26 ENCOUNTER — Other Ambulatory Visit: Payer: Self-pay | Admitting: Gastroenterology

## 2018-06-26 DIAGNOSIS — K222 Esophageal obstruction: Secondary | ICD-10-CM

## 2018-06-26 DIAGNOSIS — R1310 Dysphagia, unspecified: Secondary | ICD-10-CM

## 2018-06-28 ENCOUNTER — Other Ambulatory Visit: Payer: Self-pay | Admitting: Internal Medicine

## 2018-06-28 DIAGNOSIS — B20 Human immunodeficiency virus [HIV] disease: Secondary | ICD-10-CM

## 2018-09-04 ENCOUNTER — Other Ambulatory Visit: Payer: Self-pay | Admitting: *Deleted

## 2018-09-04 ENCOUNTER — Other Ambulatory Visit: Payer: PRIVATE HEALTH INSURANCE

## 2018-09-04 DIAGNOSIS — Z113 Encounter for screening for infections with a predominantly sexual mode of transmission: Secondary | ICD-10-CM

## 2018-09-04 DIAGNOSIS — Z79899 Other long term (current) drug therapy: Secondary | ICD-10-CM

## 2018-09-04 DIAGNOSIS — B20 Human immunodeficiency virus [HIV] disease: Secondary | ICD-10-CM

## 2018-09-04 NOTE — Addendum Note (Signed)
Addended by: Landis Gandy on: 09/04/2018 11:45 AM   Modules accepted: Orders

## 2018-09-05 ENCOUNTER — Other Ambulatory Visit: Payer: PRIVATE HEALTH INSURANCE

## 2018-09-05 LAB — T-HELPER CELL (CD4) - (RCID CLINIC ONLY)
CD4 % Helper T Cell: 32 % — ABNORMAL LOW (ref 33–55)
CD4 T CELL ABS: 430 /uL (ref 400–2700)

## 2018-09-06 LAB — COMPLETE METABOLIC PANEL WITH GFR
AG RATIO: 1.4 (calc) (ref 1.0–2.5)
ALKALINE PHOSPHATASE (APISO): 41 U/L (ref 40–115)
ALT: 44 U/L (ref 9–46)
AST: 66 U/L — AB (ref 10–35)
Albumin: 4.8 g/dL (ref 3.6–5.1)
BUN/Creatinine Ratio: 14 (calc) (ref 6–22)
BUN: 23 mg/dL (ref 7–25)
CALCIUM: 10.3 mg/dL (ref 8.6–10.3)
CHLORIDE: 101 mmol/L (ref 98–110)
CO2: 26 mmol/L (ref 20–32)
Creat: 1.62 mg/dL — ABNORMAL HIGH (ref 0.70–1.25)
GFR, Est African American: 50 mL/min/{1.73_m2} — ABNORMAL LOW (ref >=60)
GFR, Est Non African American: 43 mL/min/{1.73_m2} — ABNORMAL LOW (ref >=60)
GLOBULIN: 3.4 g/dL (ref 1.9–3.7)
GLUCOSE: 113 mg/dL — AB (ref 65–99)
POTASSIUM: 4.2 mmol/L (ref 3.5–5.3)
SODIUM: 136 mmol/L (ref 135–146)
Total Bilirubin: 0.8 mg/dL (ref 0.2–1.2)
Total Protein: 8.2 g/dL — ABNORMAL HIGH (ref 6.1–8.1)

## 2018-09-06 LAB — HIV-1 RNA QUANT-NO REFLEX-BLD
HIV 1 RNA Quant: <20 copies/mL
HIV-1 RNA Quant, Log: <1.3 Log copies/mL

## 2018-09-06 LAB — CBC WITH DIFFERENTIAL/PLATELET
BASOS PCT: 0.9 %
Basophils Absolute: 38 cells/uL (ref 0–200)
EOS PCT: 3.1 %
Eosinophils Absolute: 130 cells/uL (ref 15–500)
HCT: 40.9 % (ref 38.5–50.0)
Hemoglobin: 14.4 g/dL (ref 13.2–17.1)
LYMPHS ABS: 1289 {cells}/uL (ref 850–3900)
MCH: 33.1 pg — ABNORMAL HIGH (ref 27.0–33.0)
MCHC: 35.2 g/dL (ref 32.0–36.0)
MCV: 94 fL (ref 80.0–100.0)
MPV: 10.1 fL (ref 7.5–12.5)
Monocytes Relative: 9.2 %
NEUTROS PCT: 56.1 %
Neutro Abs: 2356 cells/uL (ref 1500–7800)
PLATELETS: 227 10*3/uL (ref 140–400)
RBC: 4.35 10*6/uL (ref 4.20–5.80)
RDW: 12.5 % (ref 11.0–15.0)
Total Lymphocyte: 30.7 %
WBC mixed population: 386 cells/uL (ref 200–950)
WBC: 4.2 10*3/uL (ref 3.8–10.8)

## 2018-09-06 LAB — LIPID PANEL
Cholesterol: 207 mg/dL — ABNORMAL HIGH (ref <200)
HDL: 30 mg/dL — AB (ref >40)
LDL Cholesterol (Calc): 124 mg/dL (calc) — ABNORMAL HIGH
Non-HDL Cholesterol (Calc): 177 mg/dL (calc) — ABNORMAL HIGH (ref <130)
Total CHOL/HDL Ratio: 6.9 (calc) — ABNORMAL HIGH (ref <5.0)
Triglycerides: 373 mg/dL — ABNORMAL HIGH (ref <150)

## 2018-09-06 LAB — RPR: RPR: NONREACTIVE

## 2018-09-19 ENCOUNTER — Ambulatory Visit: Payer: PRIVATE HEALTH INSURANCE | Admitting: Internal Medicine

## 2018-09-19 ENCOUNTER — Encounter: Payer: Self-pay | Admitting: Internal Medicine

## 2018-09-19 DIAGNOSIS — B20 Human immunodeficiency virus [HIV] disease: Secondary | ICD-10-CM

## 2018-09-19 MED ORDER — ABACAVIR-DOLUTEGRAVIR-LAMIVUD 600-50-300 MG PO TABS: 1.0000 | ORAL_TABLET | Freq: Every day | ORAL | 11 refills | Status: DC

## 2018-09-19 NOTE — Assessment & Plan Note (Signed)
His infection remains under excellent, long-term control.  He will continue Triumeq and follow-up after lab work in 1 year.

## 2018-09-19 NOTE — Progress Notes (Signed)
Patient Active Problem List   Diagnosis Date Noted  . Peripheral neuropathy 09/30/2013    Priority: High  . Human immunodeficiency virus (HIV) disease ([name]) 12/01/2006    Priority: High  . Depression 10/01/2013    Priority: Medium  . Renal insufficiency 08/19/2013    Priority: Medium  . 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 Oral cancer ([name]) 08/18/2008    Priority: Medium  . Hypothyroidism 11/01/2012  . Hyperglycemia 12/08/2011  . HYPOGONADISM 12/23/2009  . Dyslipidemia 01/03/2008  . SYPHILIS, [name], LATENT NOS 01/18/2007  . Essential hypertension 12/01/2006  . PROTEINURIA 12/01/2006    Patient's Medications  New Prescriptions   No medications on file  Previous Medications   CHLORHEXIDINE (PERIDEX) 0.12 % SOLUTION    Use as directed 15 mLs in the mouth or throat 2 (two) times daily.   CLONAZEPAM (KLONOPIN) 1 MG TABLET    TAKE 1 TABLET 3 TIMES A DAY AS NEEDED FOR ANXIETY   DOLUTEGRAVIR SODIUM (TIVICAY) 50 MG TABS    Take 1 tablet (50 mg total) by mouth daily.   FENOFIBRATE (TRICOR) 145 MG TABLET    TAKE 1 TABLET EVERY DAY   FLUOXETINE (PROZAC) 20 MG CAPSULE    TAKE ONE CAPSULE EVERY DAY   LEVOTHYROXINE (SYNTHROID, LEVOTHROID) 50 MCG TABLET    Take 1 tablet (50 mcg total) by mouth daily before breakfast.   METOCLOPRAMIDE (REGLAN) 5 MG TABLET    TAKE 1 TABLET 4 TIMES A DAY BEFORE MEALS AND AT BEDTIME   PANTOPRAZOLE (PROTONIX) 40 MG TABLET    TAKE 1 TABLET BY MOUTH EVERY DAY IN THE MORNING   TEMAZEPAM (RESTORIL) 30 MG CAPSULE    Take 30 mg by mouth at bedtime.   TRIUMEQ 600-50-300 MG TABLET    TAKE 1 TABLET BY MOUTH EVERY DAY  Modified Medications   Modified Medication Previous Medication   ABACAVIR-DOLUTEGRAVIR-LAMIVUDINE (TRIUMEQ) 600-50-300 MG TABLET TRIUMEQ 600-50-300 MG tablet      Take 1 tablet by mouth daily.    TAKE 1 TABLET BY MOUTH DAILY.  Discontinued Medications   No medications on file    Subjective: Christopher [name] is in for his  routine HIV follow-up visit.  He has had no problems obtaining, taking or tolerating his Triumeq and thinks he is only meant missed 1 dose since he started therapy decades ago.  He received his influenza vaccine at his PCPs office.  He has no current problems swallowing his Triumeq.  Review of Systems: Review of Systems  Constitutional: Negative for chills, diaphoresis, fever, malaise/fatigue and weight loss.  HENT: Negative for sore throat.   Respiratory: Negative for cough, sputum production and shortness of breath.   Cardiovascular: Negative for chest pain.  Gastrointestinal: Negative for abdominal pain, diarrhea, heartburn, nausea and vomiting.  Genitourinary: Negative for dysuria and frequency.  Musculoskeletal: Negative for joint pain and myalgias.  Skin: Negative for rash.  Neurological: Negative for dizziness and headaches.  Psychiatric/Behavioral: Negative for depression and substance abuse. The patient is not nervous/anxious.     Past Medical History:  Diagnosis Date  . Anxiety   . Cataract   . Chronic kidney disease   . GERD (gastroesophageal reflux disease)   . Heart murmur   . HIV DISEASE 12/01/2006  . HYPERLIPIDEMIA, WITH LOW HDL 01/03/2008  . Hypertension   . HYPERTENSION 12/01/2006  . HYPOGONADISM 12/23/2009  . Neuromuscular disorder ([name])   . SYPHILIS, [name], LATENT NOS 01/18/2007  .  Thyroid disease   . 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 Tuberculosis    history of TB about 30 years ago    Social History   Tobacco Use  . Smoking status: Former Research scientist (life sciences)  . Smokeless tobacco: Never Used  . Tobacco comment: quit when he was 67 years old.  Substance Use Topics  . Alcohol use: No    Alcohol/week: 2.0 standard drinks    Types: 2 Shots of liquor per week    Comment: very rare   . Drug use: No    Comment: 40 years ago used multiple drugs    Family History  Problem Relation Age of Onset  . Diabetes Mother   . COPD Mother   . Heart attack Father   .  Colon cancer Neg Hx   . Colon polyps Neg Hx   . Esophageal cancer Neg Hx   . Rectal cancer Neg Hx   . Stomach cancer Neg Hx     Allergies  Allergen Reactions  . Codeine   . Sulfamethoxazole-Trimethoprim   . Sulfonamide Derivatives     Health Maintenance  Topic Date Due  . COLONOSCOPY  02/08/2000  . PNA vac Low Risk Adult (1 of 2 - PCV13) 02/08/2015  . TETANUS/TDAP  05/20/2019  . INFLUENZA VACCINE  Completed  . Hepatitis C Screening  Completed    Objective:  Vitals:   09/19/18 1422  BP: 106/70  Pulse: 79  Temp: 98 F (36.7 C)  TempSrc: Oral  Weight: 165 lb (74.8 kg)   Body mass index is 23.68 kg/m.  Physical Exam  Constitutional: He is oriented to person, place, and time.  He is in good spirits as usual.  HENT:  Mouth/Throat: No oropharyngeal exudate.  Eyes: Conjunctivae are normal.  Cardiovascular: Normal rate, regular rhythm and normal heart sounds.  No murmur heard. Pulmonary/Chest: Effort normal and breath sounds normal.  Abdominal: Soft. He exhibits no mass. There is no tenderness.  Musculoskeletal: Normal range of motion.  Neurological: He is alert and oriented to person, place, and time.  Skin: No rash noted.  Psychiatric: He has a normal mood and affect.    Lab Results Lab Results  Component Value Date   WBC 4.2 09/04/2018   HGB 14.4 09/04/2018   HCT 40.9 09/04/2018   MCV 94.0 09/04/2018   PLT 227 09/04/2018    Lab Results  Component Value Date   CREATININE 1.62 (H) 09/04/2018   BUN 23 09/04/2018   NA 136 09/04/2018   K 4.2 09/04/2018   CL 101 09/04/2018   CO2 26 09/04/2018    Lab Results  Component Value Date   ALT 44 09/04/2018   AST 66 (H) 09/04/2018   ALKPHOS 43 07/18/2016   BILITOT 0.8 09/04/2018    Lab Results  Component Value Date   CHOL 207 (H) 09/04/2018   HDL 30 (L) 09/04/2018   LDLCALC 124 (H) 09/04/2018   TRIG 373 (H) 09/04/2018   CHOLHDL 6.9 (H) 09/04/2018   Lab Results  Component Value Date   LABRPR  NON-REACTIVE 09/04/2018   RPRTITER 1:1 02/02/2012   HIV 1 RNA Quant (copies/mL)  Date Value  09/04/2018 <20 NOT DETECTED  07/18/2016 <20  01/27/2016 <20   CD4 T Cell Abs (/uL)  Date Value  09/04/2018 430  07/18/2016 370 (L)  01/27/2016 330 (L)     Problem List Items Addressed This Visit      High   Human immunodeficiency virus (HIV) disease (  [place])    His infection remains under excellent, long-term control.  He will continue Triumeq and follow-up after lab work in 1 year.      Relevant Medications   abacavir-dolutegravir-lamiVUDine (TRIUMEQ) 600-50-300 MG tablet   Other Relevant Orders   CBC   T-helper cell (CD4)- ([facility] clinic only)   Comprehensive metabolic panel   Lipid panel   RPR   HIV-1 RNA quant-no reflex-bld    Other Visit Diagnoses    HIV disease ([place])       Relevant Medications   abacavir-dolutegravir-lamiVUDine (TRIUMEQ) 323-55-732 MG tablet        Michel Bickers, MD [facility] for [place] [phone] pager   [phone] cell 09/19/2018, 2:37 PM

## 2018-09-21 ENCOUNTER — Telehealth: Payer: Self-pay | Admitting: *Deleted

## 2018-09-21 ENCOUNTER — Ambulatory Visit (INDEPENDENT_AMBULATORY_CARE_PROVIDER_SITE_OTHER): Payer: PRIVATE HEALTH INSURANCE | Admitting: Pharmacist

## 2018-09-21 DIAGNOSIS — B20 Human immunodeficiency virus [HIV] disease: Secondary | ICD-10-CM | POA: Diagnosis not present

## 2018-09-21 NOTE — Telephone Encounter (Signed)
PA submitted electronically for triumeq, requested urgent review.   Landis Gandy, RN

## 2018-09-21 NOTE — Progress Notes (Signed)
Christopher [name] walked in with questions regarding prior authorization for Triumeq. Paperwork was received by RN ~10 minutes ago. Informed him that we are working on it but it may take a few days to be approved. Confirmed he is not yet out of Triumeq. Encouraged him to call if he encounters any further issues.

## 2018-09-24 ENCOUNTER — Other Ambulatory Visit: Payer: Self-pay | Admitting: *Deleted

## 2018-09-24 DIAGNOSIS — B20 Human immunodeficiency virus [HIV] disease: Secondary | ICD-10-CM

## 2018-09-24 MED ORDER — ABACAVIR-DOLUTEGRAVIR-LAMIVUD 600-50-300 MG PO TABS: 1.0000 | ORAL_TABLET | Freq: Every day | ORAL | 4 refills | Status: DC

## 2018-09-24 NOTE — Telephone Encounter (Signed)
Received fax from [employer] notifying office that PA for Triumeq was approved from 09/21/18-09/22/19. Pharmacy was informed of PA approval and will update their record. [place]

## 2018-09-25 ENCOUNTER — Other Ambulatory Visit: Payer: Self-pay | Admitting: Gastroenterology

## 2018-09-25 DIAGNOSIS — K222 Esophageal obstruction: Secondary | ICD-10-CM

## 2018-09-25 DIAGNOSIS — R1310 Dysphagia, unspecified: Secondary | ICD-10-CM

## 2018-12-09 ENCOUNTER — Other Ambulatory Visit: Payer: Self-pay | Admitting: Internal Medicine

## 2018-12-09 ENCOUNTER — Other Ambulatory Visit: Payer: Self-pay | Admitting: Gastroenterology

## 2018-12-09 DIAGNOSIS — E785 Hyperlipidemia, unspecified: Secondary | ICD-10-CM

## 2018-12-31 ENCOUNTER — Other Ambulatory Visit: Payer: Self-pay | Admitting: Internal Medicine

## 2018-12-31 DIAGNOSIS — G629 Polyneuropathy, unspecified: Secondary | ICD-10-CM

## 2019-01-04 ENCOUNTER — Other Ambulatory Visit: Payer: Self-pay

## 2019-01-04 ENCOUNTER — Other Ambulatory Visit: Payer: Self-pay | Admitting: Behavioral Health

## 2019-01-04 ENCOUNTER — Telehealth: Payer: Self-pay | Admitting: Gastroenterology

## 2019-01-04 DIAGNOSIS — E785 Hyperlipidemia, unspecified: Secondary | ICD-10-CM

## 2019-01-04 MED ORDER — PANTOPRAZOLE SODIUM 40 MG PO TBEC: DELAYED_RELEASE_TABLET | ORAL | 0 refills | Status: DC

## 2019-01-04 MED ORDER — FENOFIBRATE 145 MG PO TABS: 145.0000 mg | ORAL_TABLET | Freq: Every day | ORAL | 1 refills | Status: DC

## 2019-01-04 NOTE — Telephone Encounter (Signed)
Refill request for Protonix 40 mg one a day. contacted Christopher [name] and scheduled a follow up for medication refills. 30 days supply given of the Protonix. Sent Rx to [facility] as he requested. Pt understands to keep follow up for any additional refills.

## 2019-01-04 NOTE — Telephone Encounter (Signed)
PT advised that the med pantoprazole (PROTONIX) has been expired and also changed pharmacy. Should be sent to: [facility] on [address].[place]

## 2019-01-31 ENCOUNTER — Ambulatory Visit: Payer: PRIVATE HEALTH INSURANCE | Admitting: Gastroenterology

## 2019-01-31 ENCOUNTER — Encounter: Payer: Self-pay | Admitting: Gastroenterology

## 2019-01-31 ENCOUNTER — Other Ambulatory Visit: Payer: Self-pay

## 2019-01-31 VITALS — BP 102/68 | HR 76 | Temp 96.0°F | Ht 70.0 in | Wt 167.1 lb

## 2019-01-31 DIAGNOSIS — K3184 Gastroparesis: Secondary | ICD-10-CM | POA: Diagnosis not present

## 2019-01-31 DIAGNOSIS — K222 Esophageal obstruction: Secondary | ICD-10-CM

## 2019-01-31 DIAGNOSIS — R1310 Dysphagia, unspecified: Secondary | ICD-10-CM | POA: Diagnosis not present

## 2019-01-31 DIAGNOSIS — K5904 Chronic idiopathic constipation: Secondary | ICD-10-CM | POA: Diagnosis not present

## 2019-01-31 MED ORDER — PANTOPRAZOLE SODIUM 40 MG PO TBEC: DELAYED_RELEASE_TABLET | ORAL | 3 refills | Status: DC

## 2019-01-31 MED ORDER — METOCLOPRAMIDE HCL 5 MG PO TABS: 5.0000 mg | ORAL_TABLET | Freq: Three times a day (TID) | ORAL | 6 refills | Status: DC

## 2019-01-31 NOTE — Progress Notes (Signed)
[facility] Progress Note  Chief Complaint: GERD and esophageal stricture  Subjective  History:  Christopher [name] follows up with me for the first time in a year.  Throughout 2018 and Christopher [name] 2019, he had severe dysphagia from esophageal stricture related to reflux and probable gastroparesis.  As before, he is more uncertain about his medicines.  He believes he is taking the Protonix daily, but thinks he may have run out of the metoclopramide.  He has chronic dysphagia, but it is under good control if he stays on the soft diet that he has become accustomed to.  He has not been nauseated or vomiting.  Over this last year he has noticed intermittent constipation that rectal bleeding.  ROS: Cardiovascular:  no chest pain Respiratory: no dyspnea Anxiety The patient's Past Medical, Family and Social History were reviewed and are on file in the EMR.  Objective:  Med list reviewed  Current Outpatient Medications:    abacavir-dolutegravir-lamiVUDine (TRIUMEQ) 600-50-300 MG tablet, Take 1 tablet by mouth daily., Disp: 90 tablet, Rfl: 4   chlorhexidine (PERIDEX) 0.12 % solution, Use as directed 15 mLs in the mouth or throat 2 (two) times daily., Disp: , Rfl:    clonazePAM (KLONOPIN) 1 MG tablet, TAKE 1 TABLET 3 TIMES A DAY AS NEEDED FOR ANXIETY, Disp: 90 tablet, Rfl: 2   fenofibrate (TRICOR) 145 MG tablet, Take 1 tablet (145 mg total) by mouth daily., Disp: 90 tablet, Rfl: 1   FLUoxetine (PROZAC) 20 MG capsule, TAKE ONE CAPSULE EVERY DAY, Disp: 90 capsule, Rfl: 1   levothyroxine (SYNTHROID, LEVOTHROID) 50 MCG tablet, Take 1 tablet (50 mcg total) by mouth daily before breakfast., Disp: 30 tablet, Rfl: 0   metoCLOPramide (REGLAN) 5 MG tablet, Take 1 tablet (5 mg total) by mouth 3 (three) times daily before meals., Disp: 90 tablet, Rfl: 6   pantoprazole (PROTONIX) 40 MG tablet, One a day, Disp: 90 tablet, Rfl: 3   temazepam (RESTORIL) 30 MG capsule, Take 30 mg by mouth at bedtime., Disp:  , Rfl: 5   [DISCONTINUED] Dolutegravir Sodium (TIVICAY) 50 MG TABS, Take 1 tablet (50 mg total) by mouth daily., Disp: 30 tablet, Rfl: 11   Vital signs in last 24 hrs: Vitals:   01/31/19 1456  BP: 102/68  Pulse: 76  Temp: (!) 96 F (35.6 C)    Physical Exam  Thin as before, postsurgical changes from his previous section of head and neck cancer with dysarthria  Cardiac: RRR without murmurs, S1S2 heard, no peripheral edema  Pulm: clear to auscultation bilaterally, normal RR and effort noted  Abdomen: soft, no tenderness, with active bowel sounds. No guarding or palpable hepatosplenomegaly.  Skin; warm and dry, no jaundice or rash   @ASSESSMENTPLANBEGIN @ Assessment: Encounter Diagnoses  Name Primary?   Dysphagia, unspecified type    Esophageal stricture    Gastroparesis Yes   Chronic idiopathic constipation    Chronic stable dysphagia from severe reflux exacerbated by suspected gastroparesis.  He never had a gastric emptying study, but retained food on multiple upper endoscopies suggested this diagnosis.  He has improved and remains stable on current regimen, so we will continue it.  He has not developed side effects of metoclopramide, and I reviewed what those might be.  Constipation is likely from decreased dietary fiber due to his special diet for gastroparesis.  I recommended trial of low-dose daily MiraLAX.  He had not found Senokot or stool softener to be very helpful.  I have refilled his medicines,  and had him decrease the metoclopramide to 3 times a day with meals and cut out the bedtime dose.  He will continue pantoprazole once daily.  See me in a year or sooner as needed.  I would like him to reconsider a screening colonoscopy.  He did not feel he was ready for that in the past.   Total time 25 minutes, over half spent face-to-face with patient in counseling and coordination of care.   Nelida Meuse III

## 2019-01-31 NOTE — Patient Instructions (Signed)
If you are age 69 or older, your body mass index should be between 23-30. Your Body mass index is 23.98 kg/m. If this is out of the aforementioned range listed, please consider follow up with your Primary Care Provider.  If you are age 43 or younger, your body mass index should be between 19-25. Your Body mass index is 23.98 kg/m. If this is out of the aformentioned range listed, please consider follow up with your Primary Care Provider.   We have sent the following medications to your pharmacy for you to pick up at your convenience: Protonix, Reglan  Please start Miralax 1/2 to 1 capful daily.  Please follow up in 1 year.   It was a pleasure to see you today!  Dr. Loletha Carrow

## 2019-07-04 ENCOUNTER — Other Ambulatory Visit: Payer: Self-pay | Admitting: Internal Medicine

## 2019-07-04 DIAGNOSIS — G629 Polyneuropathy, unspecified: Secondary | ICD-10-CM

## 2019-08-10 ENCOUNTER — Other Ambulatory Visit: Payer: Self-pay | Admitting: Internal Medicine

## 2019-08-10 DIAGNOSIS — E785 Hyperlipidemia, unspecified: Secondary | ICD-10-CM

## 2019-09-20 ENCOUNTER — Other Ambulatory Visit: Payer: Self-pay

## 2019-09-20 ENCOUNTER — Other Ambulatory Visit: Payer: PRIVATE HEALTH INSURANCE

## 2019-09-20 DIAGNOSIS — B20 Human immunodeficiency virus [HIV] disease: Secondary | ICD-10-CM

## 2019-09-20 LAB — T-HELPER CELL (CD4) - (RCID CLINIC ONLY)
CD4 % Helper T Cell: 33 % (ref 33–65)
CD4 T Cell Abs: 250 /uL — ABNORMAL LOW (ref 400–1790)

## 2019-09-23 ENCOUNTER — Other Ambulatory Visit: Payer: Self-pay | Admitting: Gastroenterology

## 2019-09-23 DIAGNOSIS — K222 Esophageal obstruction: Secondary | ICD-10-CM

## 2019-09-23 DIAGNOSIS — R1310 Dysphagia, unspecified: Secondary | ICD-10-CM

## 2019-09-27 LAB — HIV-1 RNA QUANT-NO REFLEX-BLD
HIV 1 RNA Quant: <20 copies/mL
HIV-1 RNA Quant, Log: <1.3 Log copies/mL

## 2019-09-27 LAB — COMPREHENSIVE METABOLIC PANEL
AG Ratio: 1.2 (calc) (ref 1.0–2.5)
ALT: 21 U/L (ref 9–46)
AST: 49 U/L — ABNORMAL HIGH (ref 10–35)
Albumin: 4.3 g/dL (ref 3.6–5.1)
Alkaline phosphatase (APISO): 49 U/L (ref 35–144)
BUN: 11 mg/dL (ref 7–25)
CO2: 22 mmol/L (ref 20–32)
Calcium: 9.7 mg/dL (ref 8.6–10.3)
Chloride: 102 mmol/L (ref 98–110)
Creat: 1.2 mg/dL (ref 0.70–1.25)
Globulin: 3.5 g/dL (calc) (ref 1.9–3.7)
Glucose, Bld: 289 mg/dL — ABNORMAL HIGH (ref 65–99)
Potassium: 3.8 mmol/L (ref 3.5–5.3)
Sodium: 135 mmol/L (ref 135–146)
Total Bilirubin: 0.7 mg/dL (ref 0.2–1.2)
Total Protein: 7.8 g/dL (ref 6.1–8.1)

## 2019-09-27 LAB — RPR: RPR Ser Ql: NONREACTIVE

## 2019-09-27 LAB — CBC
HCT: 40 % (ref 38.5–50.0)
Hemoglobin: 13.5 g/dL (ref 13.2–17.1)
MCH: 35 pg — ABNORMAL HIGH (ref 27.0–33.0)
MCHC: 33.8 g/dL (ref 32.0–36.0)
MCV: 103.6 fL — ABNORMAL HIGH (ref 80.0–100.0)
MPV: 10.6 fL (ref 7.5–12.5)
Platelets: 165 10*3/uL (ref 140–400)
RBC: 3.86 10*6/uL — ABNORMAL LOW (ref 4.20–5.80)
RDW: 12.1 % (ref 11.0–15.0)
WBC: 3 10*3/uL — ABNORMAL LOW (ref 3.8–10.8)

## 2019-09-27 LAB — LIPID PANEL
Cholesterol: 185 mg/dL (ref <200)
HDL: 29 mg/dL — ABNORMAL LOW (ref >=40)
LDL Cholesterol (Calc): 105 mg/dL (calc) — ABNORMAL HIGH
Non-HDL Cholesterol (Calc): 156 mg/dL (calc) — ABNORMAL HIGH (ref <130)
Total CHOL/HDL Ratio: 6.4 (calc) — ABNORMAL HIGH (ref <5.0)
Triglycerides: 386 mg/dL — ABNORMAL HIGH (ref <150)

## 2019-10-07 ENCOUNTER — Other Ambulatory Visit: Payer: Self-pay

## 2019-10-07 ENCOUNTER — Encounter: Payer: Self-pay | Admitting: Internal Medicine

## 2019-10-07 ENCOUNTER — Ambulatory Visit: Payer: PRIVATE HEALTH INSURANCE | Admitting: Internal Medicine

## 2019-10-07 DIAGNOSIS — B20 Human immunodeficiency virus [HIV] disease: Secondary | ICD-10-CM

## 2019-10-07 NOTE — Progress Notes (Signed)
Patient Active Problem List   Diagnosis Date Noted  . Peripheral neuropathy 09/30/2013    Priority: High  . Human immunodeficiency virus (HIV) disease ([place]) 12/01/2006    Priority: High  . Depression 10/01/2013    Priority: Medium  . Renal insufficiency 08/19/2013    Priority: Medium  . 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 Oral cancer ([place]) 08/18/2008    Priority: Medium  . Hypothyroidism 11/01/2012  . Hyperglycemia 12/08/2011  . HYPOGONADISM 12/23/2009  . Dyslipidemia 01/03/2008  . SYPHILIS, [place], LATENT NOS 01/18/2007  . Essential hypertension 12/01/2006  . PROTEINURIA 12/01/2006    Patient's Medications  New Prescriptions   No medications on file  Previous Medications   ABACAVIR-DOLUTEGRAVIR-LAMIVUDINE (TRIUMEQ) 600-50-300 MG TABLET    Take 1 tablet by mouth daily.   CHLORHEXIDINE (PERIDEX) 0.12 % SOLUTION    Use as directed 15 mLs in the mouth or throat 2 (two) times daily.   CLONAZEPAM (KLONOPIN) 1 MG TABLET    TAKE 1 TABLET 3 TIMES A DAY AS NEEDED FOR ANXIETY   DOLUTEGRAVIR SODIUM (TIVICAY) 50 MG TABS    Take 1 tablet (50 mg total) by mouth daily.   FENOFIBRATE (TRICOR) 145 MG TABLET    TAKE ONE TABLET BY MOUTH DAILY   FLUOXETINE (PROZAC) 20 MG CAPSULE    TAKE ONE CAPSULE BY MOUTH DAILY   LEVOTHYROXINE (SYNTHROID, LEVOTHROID) 50 MCG TABLET    Take 1 tablet (50 mcg total) by mouth daily before breakfast.   METOCLOPRAMIDE (REGLAN) 5 MG TABLET    TAKE ONE TABLET BY MOUTH THREE TIMES A DAY BEFORE MEALS   PANTOPRAZOLE (PROTONIX) 40 MG TABLET    One a day   TEMAZEPAM (RESTORIL) 30 MG CAPSULE    Take 30 mg by mouth at bedtime.  Modified Medications   No medications on file  Discontinued Medications   No medications on file    Subjective: [name] he is in for his routine HIV follow-up visit.  He has not had any problems obtaining, taking or tolerating his Triumeq and does not recall missing any doses.  He is feeling well.  He has been  staying at home during the Covid pandemic.  He denies feeling anxious or depressed.  He has already received his influenza vaccine.  Review of Systems: Review of Systems  Constitutional: Negative for fever and weight loss.  Respiratory: Negative for cough and shortness of breath.   Cardiovascular: Negative for chest pain.  Gastrointestinal: Negative for abdominal pain, diarrhea, nausea and vomiting.  Psychiatric/Behavioral: Negative for depression. The patient is not nervous/anxious.     Past Medical History:  Diagnosis Date  . Anxiety   . Cataract   . Chronic kidney disease   . GERD (gastroesophageal reflux disease)   . Heart murmur   . HIV DISEASE 12/01/2006  . HYPERLIPIDEMIA, WITH LOW HDL 01/03/2008  . Hypertension   . HYPERTENSION 12/01/2006  . HYPOGONADISM 12/23/2009  . Neuromuscular disorder ([place])   . SYPHILIS, [place], LATENT NOS 01/18/2007  . Thyroid disease   . 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 Tuberculosis    history of TB about 30 years ago    Social History   Tobacco Use  . Smoking status: Former Research scientist (life sciences)  . Smokeless tobacco: Never Used  . Tobacco comment: quit when he was 68 years old.  Substance Use Topics  . Alcohol use: No    Alcohol/week: 2.0  standard drinks    Types: 2 Shots of liquor per week    Comment: very rare   . Drug use: No    Comment: 40 years ago used multiple drugs    Family History  Problem Relation Age of Onset  . Diabetes Mother   . COPD Mother   . Heart attack Father   . Colon cancer Neg Hx   . Colon polyps Neg Hx   . Esophageal cancer Neg Hx   . Rectal cancer Neg Hx   . Stomach cancer Neg Hx     Allergies  Allergen Reactions  . Codeine   . Sulfamethoxazole-Trimethoprim   . Sulfonamide Derivatives     Health Maintenance  Topic Date Due  . COLONOSCOPY  02/08/2000  . PNA vac Low Risk Adult (1 of 2 - PCV13) 02/08/2015  . TETANUS/TDAP  05/20/2019  . INFLUENZA VACCINE  06/22/2019  . Hepatitis C Screening  Completed     Objective:  Vitals:   10/07/19 1428  BP: 125/75  Pulse: 82  Weight: 163 lb (73.9 kg)   Body mass index is 23.39 kg/m.  Physical Exam Constitutional:      Comments: He is calm and in good spirits.  Cardiovascular:     Rate and Rhythm: Normal rate and regular rhythm.     Heart sounds: No murmur.  Pulmonary:     Effort: Pulmonary effort is normal.     Breath sounds: Normal breath sounds.  Psychiatric:        Mood and Affect: Mood normal.     Lab Results Lab Results  Component Value Date   WBC 3.0 (L) 09/20/2019   HGB 13.5 09/20/2019   HCT 40.0 09/20/2019   MCV 103.6 (H) 09/20/2019   PLT 165 09/20/2019    Lab Results  Component Value Date   CREATININE 1.20 09/20/2019   BUN 11 09/20/2019   NA 135 09/20/2019   K 3.8 09/20/2019   CL 102 09/20/2019   CO2 22 09/20/2019    Lab Results  Component Value Date   ALT 21 09/20/2019   AST 49 (H) 09/20/2019   ALKPHOS 43 07/18/2016   BILITOT 0.7 09/20/2019    Lab Results  Component Value Date   CHOL 185 09/20/2019   HDL 29 (L) 09/20/2019   LDLCALC 105 (H) 09/20/2019   TRIG 386 (H) 09/20/2019   CHOLHDL 6.4 (H) 09/20/2019   Lab Results  Component Value Date   LABRPR NON-REACTIVE 09/20/2019   RPRTITER 1:1 02/02/2012   HIV 1 RNA Quant (copies/mL)  Date Value  09/20/2019 <20 NOT DETECTED  09/04/2018 <20 NOT DETECTED  07/18/2016 <20   CD4 T Cell Abs (/uL)  Date Value  09/20/2019 250 (L)  09/04/2018 430  07/18/2016 370 (L)     Problem List Items Addressed This Visit      High   Human immunodeficiency virus (HIV) disease ([place])    His infection remains under excellent, long-term control.  He will continue Triumeq and follow-up after lab work in 1 year.      Relevant Orders   1 Year CBC   1 Year CD4   1 Year CMP   1 Year Lipid panel   1 Year RPR   1 Year VL        Michel Bickers, MD [facility] for [place] [phone] pager   [phone] cell  10/07/2019, 2:43 PM

## 2019-10-07 NOTE — Assessment & Plan Note (Signed)
His infection remains under excellent, long-term control.  He will continue Triumeq and follow-up after lab work in 1 year.

## 2019-10-12 ENCOUNTER — Other Ambulatory Visit: Payer: Self-pay | Admitting: Internal Medicine

## 2019-10-12 DIAGNOSIS — G629 Polyneuropathy, unspecified: Secondary | ICD-10-CM

## 2019-12-22 ENCOUNTER — Other Ambulatory Visit: Payer: Self-pay | Admitting: Internal Medicine

## 2019-12-22 DIAGNOSIS — B20 Human immunodeficiency virus [HIV] disease: Secondary | ICD-10-CM

## 2019-12-24 ENCOUNTER — Telehealth: Payer: Self-pay | Admitting: *Deleted

## 2019-12-24 NOTE — Telephone Encounter (Signed)
Initiated Prior auth with [employer] [email or web address] for triumeq. RN faxed request along with last office note to [phone]. Landis Gandy, RN

## 2020-01-10 ENCOUNTER — Ambulatory Visit: Payer: PRIVATE HEALTH INSURANCE | Admitting: Gastroenterology

## 2020-02-07 ENCOUNTER — Other Ambulatory Visit: Payer: Self-pay | Admitting: Internal Medicine

## 2020-02-07 DIAGNOSIS — E785 Hyperlipidemia, unspecified: Secondary | ICD-10-CM

## 2020-02-19 ENCOUNTER — Other Ambulatory Visit: Payer: Self-pay | Admitting: Gastroenterology

## 2020-03-08 ENCOUNTER — Other Ambulatory Visit: Payer: Self-pay | Admitting: Gastroenterology

## 2020-04-04 ENCOUNTER — Other Ambulatory Visit: Payer: Self-pay | Admitting: Gastroenterology

## 2020-04-04 DIAGNOSIS — R1310 Dysphagia, unspecified: Secondary | ICD-10-CM

## 2020-04-04 DIAGNOSIS — K222 Esophageal obstruction: Secondary | ICD-10-CM

## 2020-04-08 ENCOUNTER — Other Ambulatory Visit: Payer: Self-pay | Admitting: Internal Medicine

## 2020-04-08 DIAGNOSIS — G629 Polyneuropathy, unspecified: Secondary | ICD-10-CM

## 2020-04-13 ENCOUNTER — Inpatient Hospital Stay (HOSPITAL_COMMUNITY): Payer: No Typology Code available for payment source

## 2020-04-13 ENCOUNTER — Other Ambulatory Visit: Payer: Self-pay

## 2020-04-13 ENCOUNTER — Emergency Department (HOSPITAL_COMMUNITY): Payer: No Typology Code available for payment source

## 2020-04-13 ENCOUNTER — Inpatient Hospital Stay (HOSPITAL_COMMUNITY)
Admit: 2020-04-13 | Discharge: 2020-04-13 | Disposition: A | Discharge status: Home / Self Care | Payer: No Typology Code available for payment source | Attending: Internal Medicine | Admitting: Internal Medicine

## 2020-04-13 ENCOUNTER — Inpatient Hospital Stay (HOSPITAL_COMMUNITY)
Admission: EM | Admit: 2020-04-13 | Discharge: 2020-04-16 | DRG: 637 | Disposition: A | Discharge status: Home Health | Payer: No Typology Code available for payment source | Attending: Internal Medicine | Admitting: Internal Medicine

## 2020-04-13 ENCOUNTER — Encounter (HOSPITAL_COMMUNITY): Payer: Self-pay | Admitting: Emergency Medicine

## 2020-04-13 DIAGNOSIS — Z833 Family history of diabetes mellitus: Secondary | ICD-10-CM | POA: Diagnosis not present

## 2020-04-13 DIAGNOSIS — S0990XA Unspecified injury of head, initial encounter: Secondary | ICD-10-CM | POA: Diagnosis not present

## 2020-04-13 DIAGNOSIS — Z20822 Contact with and (suspected) exposure to covid-19: Secondary | ICD-10-CM | POA: Diagnosis present

## 2020-04-13 DIAGNOSIS — R17 Unspecified jaundice: Secondary | ICD-10-CM

## 2020-04-13 DIAGNOSIS — R197 Diarrhea, unspecified: Secondary | ICD-10-CM

## 2020-04-13 DIAGNOSIS — F329 Major depressive disorder, single episode, unspecified: Secondary | ICD-10-CM | POA: Diagnosis present

## 2020-04-13 DIAGNOSIS — R05 Cough: Secondary | ICD-10-CM | POA: Diagnosis present

## 2020-04-13 DIAGNOSIS — Z8589 Personal history of malignant neoplasm of other organs and systems: Secondary | ICD-10-CM | POA: Diagnosis not present

## 2020-04-13 DIAGNOSIS — Z66 Do not resuscitate: Secondary | ICD-10-CM | POA: Diagnosis present

## 2020-04-13 DIAGNOSIS — E1165 Type 2 diabetes mellitus with hyperglycemia: Secondary | ICD-10-CM | POA: Diagnosis not present

## 2020-04-13 DIAGNOSIS — Z8611 Personal history of tuberculosis: Secondary | ICD-10-CM

## 2020-04-13 DIAGNOSIS — J9621 Acute and chronic respiratory failure with hypoxia: Secondary | ICD-10-CM | POA: Diagnosis present

## 2020-04-13 DIAGNOSIS — D539 Nutritional anemia, unspecified: Secondary | ICD-10-CM | POA: Diagnosis present

## 2020-04-13 DIAGNOSIS — R55 Syncope and collapse: Secondary | ICD-10-CM | POA: Diagnosis not present

## 2020-04-13 DIAGNOSIS — Y9301 Activity, walking, marching and hiking: Secondary | ICD-10-CM | POA: Diagnosis present

## 2020-04-13 DIAGNOSIS — W010XXA Fall on same level from slipping, tripping and stumbling without subsequent striking against object, initial encounter: Secondary | ICD-10-CM | POA: Diagnosis present

## 2020-04-13 DIAGNOSIS — Z87891 Personal history of nicotine dependence: Secondary | ICD-10-CM

## 2020-04-13 DIAGNOSIS — I129 Hypertensive chronic kidney disease with stage 1 through stage 4 chronic kidney disease, or unspecified chronic kidney disease: Secondary | ICD-10-CM | POA: Diagnosis present

## 2020-04-13 DIAGNOSIS — R059 Cough, unspecified: Secondary | ICD-10-CM

## 2020-04-13 DIAGNOSIS — K8020 Calculus of gallbladder without cholecystitis without obstruction: Secondary | ICD-10-CM | POA: Diagnosis present

## 2020-04-13 DIAGNOSIS — E440 Moderate protein-calorie malnutrition: Secondary | ICD-10-CM | POA: Insufficient documentation

## 2020-04-13 DIAGNOSIS — N179 Acute kidney failure, unspecified: Secondary | ICD-10-CM

## 2020-04-13 DIAGNOSIS — Z79899 Other long term (current) drug therapy: Secondary | ICD-10-CM

## 2020-04-13 DIAGNOSIS — E1100 Type 2 diabetes mellitus with hyperosmolarity without nonketotic hyperglycemic-hyperosmolar coma (NKHHC): Secondary | ICD-10-CM | POA: Diagnosis present

## 2020-04-13 DIAGNOSIS — S0101XA Laceration without foreign body of scalp, initial encounter: Secondary | ICD-10-CM | POA: Diagnosis present

## 2020-04-13 DIAGNOSIS — E785 Hyperlipidemia, unspecified: Secondary | ICD-10-CM | POA: Diagnosis present

## 2020-04-13 DIAGNOSIS — E039 Hypothyroidism, unspecified: Secondary | ICD-10-CM | POA: Diagnosis present

## 2020-04-13 DIAGNOSIS — Z85818 Personal history of malignant neoplasm of other sites of lip, oral cavity, and pharynx: Secondary | ICD-10-CM | POA: Diagnosis not present

## 2020-04-13 DIAGNOSIS — Z21 Asymptomatic human immunodeficiency virus [HIV] infection status: Secondary | ICD-10-CM | POA: Diagnosis present

## 2020-04-13 DIAGNOSIS — I6523 Occlusion and stenosis of bilateral carotid arteries: Secondary | ICD-10-CM | POA: Diagnosis present

## 2020-04-13 DIAGNOSIS — Y92002 Bathroom of unspecified non-institutional (private) residence single-family (private) house as the place of occurrence of the external cause: Secondary | ICD-10-CM

## 2020-04-13 DIAGNOSIS — E860 Dehydration: Secondary | ICD-10-CM | POA: Diagnosis present

## 2020-04-13 DIAGNOSIS — N1831 Chronic kidney disease, stage 3a: Secondary | ICD-10-CM | POA: Diagnosis present

## 2020-04-13 DIAGNOSIS — E876 Hypokalemia: Secondary | ICD-10-CM | POA: Diagnosis present

## 2020-04-13 DIAGNOSIS — R739 Hyperglycemia, unspecified: Secondary | ICD-10-CM

## 2020-04-13 DIAGNOSIS — K219 Gastro-esophageal reflux disease without esophagitis: Secondary | ICD-10-CM | POA: Diagnosis present

## 2020-04-13 DIAGNOSIS — E119 Type 2 diabetes mellitus without complications: Secondary | ICD-10-CM

## 2020-04-13 DIAGNOSIS — E861 Hypovolemia: Secondary | ICD-10-CM | POA: Diagnosis present

## 2020-04-13 DIAGNOSIS — E871 Hypo-osmolality and hyponatremia: Secondary | ICD-10-CM | POA: Diagnosis present

## 2020-04-13 DIAGNOSIS — D7589 Other specified diseases of blood and blood-forming organs: Secondary | ICD-10-CM | POA: Diagnosis not present

## 2020-04-13 DIAGNOSIS — I6521 Occlusion and stenosis of right carotid artery: Secondary | ICD-10-CM

## 2020-04-13 DIAGNOSIS — R631 Polydipsia: Secondary | ICD-10-CM | POA: Diagnosis present

## 2020-04-13 DIAGNOSIS — Z79890 Hormone replacement therapy (postmenopausal): Secondary | ICD-10-CM

## 2020-04-13 DIAGNOSIS — E538 Deficiency of other specified B group vitamins: Secondary | ICD-10-CM | POA: Diagnosis present

## 2020-04-13 DIAGNOSIS — E1151 Type 2 diabetes mellitus with diabetic peripheral angiopathy without gangrene: Secondary | ICD-10-CM | POA: Diagnosis present

## 2020-04-13 DIAGNOSIS — E1122 Type 2 diabetes mellitus with diabetic chronic kidney disease: Secondary | ICD-10-CM | POA: Diagnosis present

## 2020-04-13 DIAGNOSIS — E1142 Type 2 diabetes mellitus with diabetic polyneuropathy: Secondary | ICD-10-CM | POA: Diagnosis present

## 2020-04-13 LAB — BASIC METABOLIC PANEL
Anion gap: 19 — ABNORMAL HIGH (ref 5–15)
Anion gap: 13 (ref 5–15)
Anion gap: 9 (ref 5–15)
BUN: 17 mg/dL (ref 8–23)
BUN: 16 mg/dL (ref 8–23)
BUN: 14 mg/dL (ref 8–23)
CO2: 22 mmol/L (ref 22–32)
CO2: 23 mmol/L (ref 22–32)
CO2: 27 mmol/L (ref 22–32)
Calcium: 10.3 mg/dL (ref 8.9–10.3)
Calcium: 9.8 mg/dL (ref 8.9–10.3)
Calcium: 9.3 mg/dL (ref 8.9–10.3)
Chloride: 78 mmol/L — ABNORMAL LOW (ref 98–111)
Chloride: 96 mmol/L — ABNORMAL LOW (ref 98–111)
Chloride: 102 mmol/L (ref 98–111)
Creatinine, Ser: 1.93 mg/dL — ABNORMAL HIGH (ref 0.61–1.24)
Creatinine, Ser: 1.7 mg/dL — ABNORMAL HIGH (ref 0.61–1.24)
Creatinine, Ser: 1.3 mg/dL — ABNORMAL HIGH (ref 0.61–1.24)
GFR calc Af Amer: 40 mL/min — ABNORMAL LOW (ref >60)
GFR calc Af Amer: 46 mL/min — ABNORMAL LOW (ref >60)
GFR calc Af Amer: >60 mL/min (ref >60)
GFR calc non Af Amer: 34 mL/min — ABNORMAL LOW (ref >60)
GFR calc non Af Amer: 40 mL/min — ABNORMAL LOW (ref >60)
GFR calc non Af Amer: 55 mL/min — ABNORMAL LOW (ref >60)
Glucose, Bld: 1400 mg/dL (ref 70–99)
Glucose, Bld: 894 mg/dL (ref 70–99)
Glucose, Bld: 289 mg/dL — ABNORMAL HIGH (ref 70–99)
Potassium: 4.7 mmol/L (ref 3.5–5.1)
Potassium: 3.6 mmol/L (ref 3.5–5.1)
Potassium: 3.6 mmol/L (ref 3.5–5.1)
Sodium: 119 mmol/L — CL (ref 135–145)
Sodium: 132 mmol/L — ABNORMAL LOW (ref 135–145)
Sodium: 138 mmol/L (ref 135–145)

## 2020-04-13 LAB — CBC WITH DIFFERENTIAL/PLATELET
Abs Immature Granulocytes: 0.05 10*3/uL (ref 0.00–0.07)
Basophils Absolute: 0 10*3/uL (ref 0.0–0.1)
Basophils Relative: 0 %
Eosinophils Absolute: 0 10*3/uL (ref 0.0–0.5)
Eosinophils Relative: 0 %
HCT: 43.4 % (ref 39.0–52.0)
Hemoglobin: 13.1 g/dL (ref 13.0–17.0)
Immature Granulocytes: 1 %
Lymphocytes Relative: 5 %
Lymphs Abs: 0.5 10*3/uL — ABNORMAL LOW (ref 0.7–4.0)
MCH: 33.4 pg (ref 26.0–34.0)
MCHC: 30.2 g/dL (ref 30.0–36.0)
MCV: 110.7 fL — ABNORMAL HIGH (ref 80.0–100.0)
Monocytes Absolute: 0.6 10*3/uL (ref 0.1–1.0)
Monocytes Relative: 7 %
Neutro Abs: 8.3 10*3/uL — ABNORMAL HIGH (ref 1.7–7.7)
Neutrophils Relative %: 87 %
Platelets: 209 10*3/uL (ref 150–400)
RBC: 3.92 MIL/uL — ABNORMAL LOW (ref 4.22–5.81)
RDW: 14.2 % (ref 11.5–15.5)
WBC: 9.5 10*3/uL (ref 4.0–10.5)
nRBC: 0 % (ref 0.0–0.2)

## 2020-04-13 LAB — URINALYSIS, ROUTINE W REFLEX MICROSCOPIC
Bacteria, UA: NONE SEEN
Bilirubin Urine: NEGATIVE
Glucose, UA: >=500 mg/dL — AB
Hgb urine dipstick: NEGATIVE
Ketones, ur: NEGATIVE mg/dL
Leukocytes,Ua: NEGATIVE
Nitrite: NEGATIVE
Protein, ur: NEGATIVE mg/dL
Specific Gravity, Urine: 1.025 (ref 1.005–1.030)
pH: 7 (ref 5.0–8.0)

## 2020-04-13 LAB — POCT I-STAT EG7
Acid-Base Excess: 2 mmol/L (ref 0.0–2.0)
Bicarbonate: 26.3 mmol/L (ref 20.0–28.0)
Calcium, Ion: 1.14 mmol/L — ABNORMAL LOW (ref 1.15–1.40)
HCT: 40 % (ref 39.0–52.0)
Hemoglobin: 13.6 g/dL (ref 13.0–17.0)
O2 Saturation: 89 %
Potassium: 5.2 mmol/L — ABNORMAL HIGH (ref 3.5–5.1)
Sodium: 123 mmol/L — ABNORMAL LOW (ref 135–145)
TCO2: 27 mmol/L (ref 22–32)
pCO2, Ven: 38.5 mmHg — ABNORMAL LOW (ref 44.0–60.0)
pH, Ven: 7.442 — ABNORMAL HIGH (ref 7.250–7.430)
pO2, Ven: 54 mmHg — ABNORMAL HIGH (ref 32.0–45.0)

## 2020-04-13 LAB — HEPATIC FUNCTION PANEL
ALT: 25 U/L (ref 0–44)
AST: 27 U/L (ref 15–41)
Albumin: 3.8 g/dL (ref 3.5–5.0)
Alkaline Phosphatase: 68 U/L (ref 38–126)
Bilirubin, Direct: 0.6 mg/dL — ABNORMAL HIGH (ref 0.0–0.2)
Indirect Bilirubin: 1.4 mg/dL — ABNORMAL HIGH (ref 0.3–0.9)
Total Bilirubin: 2 mg/dL — ABNORMAL HIGH (ref 0.3–1.2)
Total Protein: 7.9 g/dL (ref 6.5–8.1)

## 2020-04-13 LAB — GLUCOSE, CAPILLARY
Glucose-Capillary: 157 mg/dL — ABNORMAL HIGH (ref 70–99)
Glucose-Capillary: 178 mg/dL — ABNORMAL HIGH (ref 70–99)
Glucose-Capillary: 230 mg/dL — ABNORMAL HIGH (ref 70–99)
Glucose-Capillary: 201 mg/dL — ABNORMAL HIGH (ref 70–99)

## 2020-04-13 LAB — CBG MONITORING, ED
Glucose-Capillary: >600 mg/dL (ref 70–99)
Glucose-Capillary: >600 mg/dL (ref 70–99)
Glucose-Capillary: >600 mg/dL (ref 70–99)
Glucose-Capillary: >600 mg/dL (ref 70–99)
Glucose-Capillary: >600 mg/dL (ref 70–99)
Glucose-Capillary: >600 mg/dL (ref 70–99)
Glucose-Capillary: >600 mg/dL (ref 70–99)
Glucose-Capillary: 595 mg/dL (ref 70–99)
Glucose-Capillary: >600 mg/dL (ref 70–99)
Glucose-Capillary: 469 mg/dL — ABNORMAL HIGH (ref 70–99)
Glucose-Capillary: 408 mg/dL — ABNORMAL HIGH (ref 70–99)
Glucose-Capillary: 294 mg/dL — ABNORMAL HIGH (ref 70–99)
Glucose-Capillary: 238 mg/dL — ABNORMAL HIGH (ref 70–99)
Glucose-Capillary: 211 mg/dL — ABNORMAL HIGH (ref 70–99)
Glucose-Capillary: 202 mg/dL — ABNORMAL HIGH (ref 70–99)

## 2020-04-13 LAB — PHOSPHORUS: Phosphorus: 3.3 mg/dL (ref 2.5–4.6)

## 2020-04-13 LAB — MAGNESIUM: Magnesium: 1.8 mg/dL (ref 1.7–2.4)

## 2020-04-13 LAB — OSMOLALITY: Osmolality: 348 mOsm/kg (ref 275–295)

## 2020-04-13 LAB — FOLATE: Folate: 2.6 ng/mL — ABNORMAL LOW (ref >5.9)

## 2020-04-13 LAB — HEMOGLOBIN A1C
Hgb A1c MFr Bld: 12.9 % — ABNORMAL HIGH (ref 4.8–5.6)
Mean Plasma Glucose: 324 mg/dL

## 2020-04-13 LAB — SARS CORONAVIRUS 2 BY RT PCR (HOSPITAL ORDER, PERFORMED IN ~~LOC~~ HOSPITAL LAB): SARS Coronavirus 2: NEGATIVE

## 2020-04-13 LAB — VITAMIN B12: Vitamin B-12: 381 pg/mL (ref 180–914)

## 2020-04-13 MED ORDER — TEMAZEPAM 15 MG PO CAPS
30.0000 mg | ORAL_CAPSULE | Freq: Every day | ORAL | Status: DC
  Administered 2020-04-13 – 2020-04-15 (×3): 30 mg via ORAL
  Filled 2020-04-13 (×3): qty 2

## 2020-04-13 MED ORDER — ABACAVIR-DOLUTEGRAVIR-LAMIVUD 600-50-300 MG PO TABS
1.0000 | ORAL_TABLET | Freq: Every day | ORAL | Status: DC
  Administered 2020-04-14 – 2020-04-16 (×3): 1 via ORAL
  Filled 2020-04-13 (×4): qty 1

## 2020-04-13 MED ORDER — ACETAMINOPHEN 650 MG RE SUPP: 650.0000 mg | Freq: Four times a day (QID) | RECTAL | Status: DC | PRN

## 2020-04-13 MED ORDER — SODIUM CHLORIDE 0.9 % IV BOLUS
1000.0000 mL | Freq: Once | INTRAVENOUS | Status: AC
  Administered 2020-04-13: 1000 mL via INTRAVENOUS

## 2020-04-13 MED ORDER — INSULIN GLARGINE 100 UNIT/ML ~~LOC~~ SOLN
7.0000 [IU] | Freq: Every day | SUBCUTANEOUS | Status: DC
  Administered 2020-04-13: 7 [IU] via SUBCUTANEOUS
  Filled 2020-04-13 (×2): qty 0.07

## 2020-04-13 MED ORDER — ASPIRIN EC 81 MG PO TBEC
81.0000 mg | DELAYED_RELEASE_TABLET | Freq: Every day | ORAL | Status: DC
  Administered 2020-04-13 – 2020-04-16 (×4): 81 mg via ORAL
  Filled 2020-04-13 (×4): qty 1

## 2020-04-13 MED ORDER — POTASSIUM CHLORIDE CRYS ER 20 MEQ PO TBCR
20.0000 meq | EXTENDED_RELEASE_TABLET | Freq: Once | ORAL | Status: AC
  Administered 2020-04-13: 20 meq via ORAL
  Filled 2020-04-13: qty 1

## 2020-04-13 MED ORDER — LEVOTHYROXINE SODIUM 50 MCG PO TABS
50.0000 ug | ORAL_TABLET | Freq: Every day | ORAL | Status: DC
  Administered 2020-04-14 – 2020-04-16 (×3): 50 ug via ORAL
  Filled 2020-04-13 (×3): qty 1

## 2020-04-13 MED ORDER — ACETAMINOPHEN 325 MG PO TABS: 650.0000 mg | ORAL_TABLET | Freq: Four times a day (QID) | ORAL | Status: DC | PRN

## 2020-04-13 MED ORDER — ALBUTEROL SULFATE (2.5 MG/3ML) 0.083% IN NEBU: 2.5000 mg | INHALATION_SOLUTION | Freq: Four times a day (QID) | RESPIRATORY_TRACT | Status: DC | PRN

## 2020-04-13 MED ORDER — INSULIN GLARGINE 100 UNIT/ML ~~LOC~~ SOLN: 10.0000 [IU] | Freq: Every day | SUBCUTANEOUS | Status: DC

## 2020-04-13 MED ORDER — INSULIN ASPART 100 UNIT/ML IV SOLN
10.0000 [IU] | Freq: Once | INTRAVENOUS | Status: AC
  Administered 2020-04-13: 10 [IU] via INTRAVENOUS

## 2020-04-13 MED ORDER — DEXTROSE-NACL 5-0.45 % IV SOLN: INTRAVENOUS | Status: DC

## 2020-04-13 MED ORDER — ROSUVASTATIN CALCIUM 5 MG PO TABS
10.0000 mg | ORAL_TABLET | Freq: Every day | ORAL | Status: DC
  Administered 2020-04-13 – 2020-04-16 (×4): 10 mg via ORAL
  Filled 2020-04-13 (×4): qty 2

## 2020-04-13 MED ORDER — DEXTROSE 50 % IV SOLN: 0.0000 mL | INTRAVENOUS | Status: DC | PRN

## 2020-04-13 MED ORDER — INSULIN ASPART 100 UNIT/ML ~~LOC~~ SOLN
0.0000 [IU] | Freq: Three times a day (TID) | SUBCUTANEOUS | Status: DC
  Administered 2020-04-14: 7 [IU] via SUBCUTANEOUS
  Administered 2020-04-14 – 2020-04-15 (×3): 5 [IU] via SUBCUTANEOUS
  Administered 2020-04-15: 7 [IU] via SUBCUTANEOUS
  Administered 2020-04-15: 2 [IU] via SUBCUTANEOUS
  Administered 2020-04-16: 7 [IU] via SUBCUTANEOUS

## 2020-04-13 MED ORDER — SODIUM CHLORIDE 0.9 % IV SOLN: INTRAVENOUS | Status: DC

## 2020-04-13 MED ORDER — CLONAZEPAM 1 MG PO TABS: 1.0000 mg | ORAL_TABLET | Freq: Three times a day (TID) | ORAL | Status: DC | PRN

## 2020-04-13 MED ORDER — FLUOXETINE HCL 20 MG PO CAPS
20.0000 mg | ORAL_CAPSULE | Freq: Every day | ORAL | Status: DC
  Administered 2020-04-13 – 2020-04-16 (×4): 20 mg via ORAL
  Filled 2020-04-13 (×4): qty 1

## 2020-04-13 MED ORDER — INSULIN ASPART 100 UNIT/ML ~~LOC~~ SOLN
0.0000 [IU] | Freq: Every day | SUBCUTANEOUS | Status: DC
  Administered 2020-04-13: 2 [IU] via SUBCUTANEOUS
  Administered 2020-04-14: 3 [IU] via SUBCUTANEOUS

## 2020-04-13 MED ORDER — INSULIN REGULAR(HUMAN) IN NACL 100-0.9 UT/100ML-% IV SOLN
INTRAVENOUS | Status: DC
  Administered 2020-04-13: 3.8 [IU]/h via INTRAVENOUS
  Administered 2020-04-13: 7.5 [IU]/h via INTRAVENOUS
  Filled 2020-04-13 (×2): qty 100

## 2020-04-13 MED ORDER — VITAMIN B-12 100 MCG PO TABS
100.0000 ug | ORAL_TABLET | Freq: Every day | ORAL | Status: DC
  Administered 2020-04-15 – 2020-04-16 (×2): 100 ug via ORAL
  Filled 2020-04-13 (×3): qty 1

## 2020-04-13 MED ORDER — FOLIC ACID 1 MG PO TABS
1.0000 mg | ORAL_TABLET | Freq: Every day | ORAL | Status: DC
  Administered 2020-04-13 – 2020-04-15 (×3): 1 mg via ORAL
  Filled 2020-04-13 (×3): qty 1

## 2020-04-13 NOTE — ED Notes (Signed)
Dr. Rathore at bedside 

## 2020-04-13 NOTE — Evaluation (Signed)
Physical Therapy Evaluation Patient Details Name: Christopher [name] MRN: [id number] DOB: 1951-05-27 Today's Date: 04/13/2020   History of Present Illness  Christopher [name] is a 69 y.o. male with medical history significant of anxiety, CKD 3, GERD, HIV, benign familial essential tremors, hypertension, hyperlipidemia, neuropathy, neuromuscular disorder, hypothyroidism, history of tonsillar cancer, history of TB presenting for evaluation of head injury after a mechanical fall. Found to have HHS/new onset diabetes  Clinical Impression  Pt admitted secondary to problem above with deficits below. Pt Requiring min A to take side steps at edge of stretcher. Pt with slight drop in SBP and had double vision upon sitting, that resolved with seated rest. Pt reports husband can assist PRN, but he plans to take a few days off initially. Feel pt would benefit from HHPT at d/c to increase independence and safety with mobility. Will continue to follow acutely.      Follow Up Recommendations Home health PT;Supervision/Assistance - 24 hour(initially 24/7)    Equipment Recommendations  Rolling walker with 5" wheels    Recommendations for Other Services       Precautions / Restrictions Precautions Precautions: Fall Precaution Comments: Fall prior to admission  Restrictions Weight Bearing Restrictions: No      Mobility  Bed Mobility Overal bed mobility: Needs Assistance Bed Mobility: Supine to Sit;Sit to Supine     Supine to sit: Min assist Sit to supine: Supervision   General bed mobility comments: Min A for trunk elevation to come to sitting. Increased time to perform.   Transfers Overall transfer level: Needs assistance Equipment used: 1 person hand held assist Transfers: Sit to/from Stand Sit to Stand: Min assist         General transfer comment: Min A for steadying assist. Slight drop in BP, and pt reporting some double vision that resolved within short period.    Ambulation/Gait Ambulation/Gait assistance: Min assist   Assistive device: 1 person hand held assist       General Gait Details: Min A and HHA for stability to take side steps at EOB. Pt with slight lean on stretcher for support.  Stairs            Wheelchair Mobility    Modified Rankin (Stroke Patients Only)       Balance Overall balance assessment: Needs assistance Sitting-balance support: No upper extremity supported;Feet supported Sitting balance-Leahy Scale: Good     Standing balance support: Single extremity supported Standing balance-Leahy Scale: Poor Standing balance comment: Reliant on at least 1 UE support                              Pertinent Vitals/Pain Pain Assessment: No/denies pain    Home Living Family/patient expects to be discharged to:: Private residence Living Arrangements: Spouse/significant other(Christopher [name]) Available Help at Discharge: Family;Available PRN/intermittently Type of Home: House Home Access: Stairs to enter   [employer] of Steps: 1 Home Layout: Two level Home Equipment: Cane - single point      Prior Function Level of Independence: Independent               Hand Dominance        Extremity/Trunk Assessment   Upper Extremity Assessment Upper Extremity Assessment: Defer to OT evaluation    Lower Extremity Assessment Lower Extremity Assessment: Generalized weakness    Cervical / Trunk Assessment Cervical / Trunk Assessment: Kyphotic  Communication   Communication: No difficulties  Cognition Arousal/Alertness: Awake/alert Behavior  During Therapy: WFL for tasks assessed/performed Overall Cognitive Status: Within Functional Limits for tasks assessed                                        General Comments      Exercises     Assessment/Plan    PT Assessment Patient needs continued PT services  PT Problem List Decreased strength;Decreased balance;Decreased  mobility;Decreased activity tolerance;Decreased knowledge of use of DME;Decreased knowledge of precautions       PT Treatment Interventions Gait training;DME instruction;Therapeutic activities;Functional mobility training;Stair training;Therapeutic exercise;Balance training;Patient/family education    PT Goals (Current goals can be found in the Care Plan section)  Acute Rehab PT Goals Patient Stated Goal: to go home PT Goal Formulation: With patient Time For Goal Achievement: 04/27/20 Potential to Achieve Goals: Good    Frequency Min 3X/week   Barriers to discharge        Co-evaluation               AM-PAC PT "6 Clicks" Mobility  Outcome Measure Help needed turning from your back to your side while in a flat bed without using bedrails?: A Little Help needed moving from lying on your back to sitting on the side of a flat bed without using bedrails?: A Little Help needed moving to and from a bed to a chair (including a wheelchair)?: A Little Help needed standing up from a chair using your arms (e.g., wheelchair or bedside chair)?: A Little Help needed to walk in hospital room?: A Little Help needed climbing 3-5 steps with a railing? : A Lot 6 Click Score: 17    End of Session Equipment Utilized During Treatment: Gait belt Activity Tolerance: Patient tolerated treatment well Patient left: in bed;with call bell/phone within reach;with family/visitor present Nurse Communication: Mobility status PT Visit Diagnosis: History of falling (Z91.81);Repeated falls (R29.6);Muscle weakness (generalized) (M62.81);Unsteadiness on feet (R26.81)    Time: [id number] PT Time Calculation (min) (ACUTE ONLY): 15 min   Charges:   PT Evaluation $PT Eval Moderate Complexity: 1 Mod          Reuel Derby, PT, DPT  Acute Rehabilitation Services  Pager: [phone] Office: [phone]   Rudean Hitt 04/13/2020, 3:59 PM

## 2020-04-13 NOTE — Evaluation (Signed)
Occupational Therapy Evaluation Patient Details Name: Christopher Christopher [name] MRN: [id number] DOB: [birth year] Today's Date: 04/13/2020    History of Present Illness Christopher Christopher [name] is a 69 y.o. male with medical history significant of anxiety, CKD 3, GERD, HIV, benign familial essential tremors, hypertension, hyperlipidemia, neuropathy, neuromuscular disorder, hypothyroidism, history of tonsillar cancer, history of TB presenting for evaluation of head injury after a mechanical fall. Found to have HHS/new onset diabetes   Clinical Impression   This 69 yo male admitted with above presents to acute OT with PLOF of being totally independent with all basic ADLs, IADLs, and driving. Currently he is unsteady on his feet requiring one person HHA for balance thus affecting his safety and independence with basic ADLs. He will benefit from acute OT without need for follow up .     Follow Up Recommendations  No OT follow up(24 hour S/prn A until husband feels safe to leave him home alone)    Equipment Recommendations  None recommended by OT       Precautions / Restrictions Restrictions Weight Bearing Restrictions: No      Mobility Bed Mobility Overal bed mobility: Needs Assistance Bed Mobility: Supine to Sit;Sit to Supine     Supine to sit: Supervision Sit to supine: Supervision      Transfers Overall transfer level: Needs assistance Equipment used: 1 person hand held assist Transfers: Sit to/from Stand;Stand Pivot Transfers Sit to Stand: Min assist Stand pivot transfers: Min assist            Balance Overall balance assessment: Needs assistance Sitting-balance support: No upper extremity supported;Feet supported Sitting balance-Leahy Scale: Good     Christopher [name] balance support: Single extremity supported Christopher [name] balance-Leahy Scale: Poor                             ADL either performed or assessed with clinical judgement   ADL Overall ADL's : Needs  assistance/impaired Eating/Feeding: Independent;Sitting   Grooming: Set up;Sitting   Upper Body Bathing: Set up;Sitting   Lower Body Bathing: Minimal assistance;Sit to/from stand   Upper Body Dressing : Set up;Sitting   Lower Body Dressing: Minimal assistance;Sit to/from stand   Toilet Transfer: Minimal Scientist, forensic Details (indicate cue type and reason): one hand A Toileting- Clothing Manipulation and Hygiene: Minimal assistance;Sit to/from stand         General ADL Comments: Pt's husband Christopher Christopher [name]) asking about weighted utensil for patient due to benign familial tremors. I told him that the counter weighted utensils may work (but the weighted ones have not really shown to be effective). I also informed him that has been shown to be more effective is stablizing the upper arm (propping arm).     Vision Patient Visual Report: No change from baseline              Pertinent Vitals/Pain Pain Assessment: No/denies pain     Hand Dominance Right   Extremity/Trunk Assessment Upper Extremity Assessment Upper Extremity Assessment: Overall WFL for tasks assessed           Communication Communication Communication: No difficulties   Cognition Arousal/Alertness: Awake/alert Behavior During Therapy: WFL for tasks assessed/performed Overall Cognitive Status: Within Functional Limits for tasks assessed  Home Living Family/patient expects to be discharged to:: Private residence Living Arrangements: Spouse/significant other(Christopher Christopher [name]) Available Help at Discharge: Family;Available PRN/intermittently Type of Home: House Home Access: Stairs to enter Entrance Stairs-Number of Steps: 1   Home Layout: Two level Alternate Level Stairs-Number of Steps: flight Alternate Level Stairs-Rails: Right Bathroom Shower/Tub: Tub/shower unit;Curtain   Bathroom Toilet: Handicapped height     Home Equipment:  Cane - single point   Additional Comments: House is small and lots of furniture so no room to [employer] a RW      Prior Functioning/Environment Level of Independence: Independent        Comments: 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--has to sip on water alot        OT Problem List: Impaired balance (sitting and/or Christopher [name])      OT Treatment/Interventions: Self-care/ADL training;DME and/or AE instruction;Patient/family education;Balance training    OT Goals(Current goals can be found in the care plan section) Acute Rehab OT Goals Patient Stated Goal: to go home OT Goal Formulation: With patient Time For Goal Achievement: 04/27/20 Potential to Achieve Goals: Good  OT Frequency: Min 2X/week              AM-PAC OT "6 Clicks" Daily Activity     Outcome Measure Help from another person eating meals?: None Help from another person taking care of personal grooming?: A Little Help from another person toileting, which includes using toliet, bedpan, or urinal?: A Little Help from another person bathing (including washing, rinsing, drying)?: A Little Help from another person to put on and taking off regular upper body clothing?: A Little Help from another person to put on and taking off regular lower body clothing?: A Little 6 Click Score: 19   End of Session Nurse Communication: Mobility status(IV beeping)  Activity Tolerance: Patient tolerated treatment well Patient left: in bed;with call bell/phone within reach;with family/visitor present  OT Visit Diagnosis: Unsteadiness on feet (R26.81);Other abnormalities of gait and mobility (R26.89);Muscle weakness (generalized) (M62.81)                Time: [id number] OT Time Calculation (min): 41 min Charges:  OT General Charges $OT Visit: 1 Visit OT Evaluation $OT Eval Moderate Complexity: 1 Mod OT Treatments $Self Care/Home Management : 23-37 mins  [place]en Circle, OTR/L Acute Rehab Services Pager [phone] Office  [phone]  ' Christopher Christopher [name] 04/13/2020, 11:39 AM

## 2020-04-13 NOTE — Progress Notes (Addendum)
Inpatient Diabetes Program Recommendations  AACE/ADA: New Consensus Statement on Inpatient Glycemic Control (2015)  Target Ranges:  Prepandial:   less than 140 mg/dL      Peak postprandial:   less than 180 mg/dL (1-2 hours)      Critically ill patients:  140 - 180 mg/dL   Results for Christopher [name], Christopher [name] (MRN [id number]) as of 04/13/2020 09:36  Ref. Range 04/13/2020 03:52 04/13/2020 05:05 04/13/2020 05:19 04/13/2020 05:42 04/13/2020 06:15 04/13/2020 06:43 04/13/2020 07:18 04/13/2020 08:47 04/13/2020 09:20  Glucose-Capillary Latest Ref Range: 70 - 99 mg/dL >600 (HH) >600 (HH)  IV Insulin Drip started at 4:30am >600 (HH) >600 (HH) >600 (HH) >600 (HH) >600 (HH) >600 (HH) 595 (HH)   Results for Christopher [name], Christopher [name] (MRN [id number]) as of 04/13/2020 09:36  Ref. Range 04/13/2020 01:00  Glucose Latest Ref Range: 70 - 99 mg/dL 1,400 (HH)    Admit with: Fall/ Head Injury after Mechanical Fall/ HHS/ New Onset Diabetes  History: CKD3, HIV, HTN, Pre-Diabetes per his PCP (2 years ago)  Home DM Meds: None  Current Orders: IV Insulin Drip  PCP: Dr. [name]    Note Current Hemoglobin A1c level pending--Based on presentation, patient may likely need insulin at time of discharge home.  Please leave on the IV Insulin Drip until CBGs are well below 200 mg/dl and stable.  Will order educational materials for patient once has room assignment    Addendum 12:10pm--Met w/ pt and his spouse at bedside in the ED.  Discussed with patient and spouse diagnosis of HHS (pathophysiology), treatment of HHS, lab results, and transition plan to SQ insulin regimen.  Also reviewed basic pathophysiology of diabetes and various meds to treat diabetes at home (ie. Insulin, oral meds, etc).  Reviewed with pt and spouse that we are waiting on current A1c results--explained what an A1c is, what it measures, and how it will assist [place] in determining what to send pt home on to keep CBGs under control.  Pt's spouse explained to me that he  has diabetes and that [name] (the patient) was a part of learning diabetes when he went through his education.  Pt reiterated this info and told me he will just need to take some time to learn how to manage his diabetes as well.  Spouse had many questions about what meds pt will need at time of d/c.  Explained to pt and spouse that we will use many factors (ie. Patient response to insulin, how much insulin pt requires, A1c, etc) to help [place] determine a d/c med plan.  Explained to pt and spouse that I will order educational materials once pt gets a room assignment.  Explained the difference between giving insulin via vial and syringe versus insulin pens.  Spouse stated he would be able to assist pt with giving insulin if necessary.  Will follow up with pt and his spouse tomorrow 05/25.     --Will follow patient during hospitalization--  Wyn Quaker RN, MSN, CDE Diabetes Coordinator Inpatient Glycemic Control Team Team Pager: [phone] (8a-5p)

## 2020-04-13 NOTE — Progress Notes (Addendum)
PROGRESS NOTE    Christopher [name]  VOZ:[id number] DOB: Sep 13, 1951 DOA: 04/13/2020 PCP: [name], MD   Brief Narrative: 69 year old with past medical history significant for anxiety, CKD stage III, GERD, HIV, hypertension, hyperlipidemia hypothyroidism, history of tonsillar cancer, history of TB presents for evaluation of head injury after mechanical fall.  To be tachycardic, tachypneic, CBG 1400, anion gap at 14, bicarb 22 UA without ketones.  CT head showed trace amount of right hyperdense parafalcine thickening, recommendation was to repeat CT scan in 6 hours but this might be calcification.    Assessment & Plan:   Principal Problem:   Hyperosmolar hyperglycemic state (HHS) ([place]) Active Problems:   Diabetes ([place])   Head injury   Diarrhea   AKI (acute kidney injury) ([place])  1-Hyperosmolar hyperglycemic state/new onset diabetes: -Patient presented with a blood sugar 1400, gap of 19, bicarb 22 - for ketones in urine. -Patient is started on insulin drip.  Follow B-met -Plan to transition to long-acting insulin and sliding scale insulin with CBG less than 200  2-Head injury secondary to mechanical fall Patient sustained head laceration which was stapled in the ED. CT head showed trace amount of right hyperdense parafalcine thickening which could be related to calcification, recommendation was to repeat CT head in 6 hours Repeated CT head stable  3-Hyponatremia;  Pseudohyponatremia.  Related to HHS.  Improved.   4-Diarrhea: GI pathogen and enteric precaution  AKI on CKD stage III: Baseline 1.2 Related to hypovolemia related to hyperglycemia. Continue with IV fluids  Mechanical fall: PT OT eval.   Macrocytosis without anemia: Check Q25 folic acid. Folic acid low, B 12 low normal. Start supplement.   Elevated bilirubin: Right upper quadrant ultrasound.  Cholelithiasis without finding of cholecystitis Hypekalemia: Related to honk Carotid atherosclerosis: Carotid  Doppler. Preliminary results 80-90 % stenosis right ICA, vascular consulted.   Mild acute hypoxic respiratory failure: Chronic cough.  Covid test negative.  Chest x-ray: Mild bronchitis changes.  Albuterol as needed HIV: Resume Triumeq Hypothyroidism; on synthroid.  Estimated body mass index is 23.39 kg/m as calculated from the following:   Height as of 01/31/19: _0  (1.778 m).   Weight as of 10/07/19: 73.9 kg.   DVT prophylaxis: SCDs Code Status: DNR  family Communication: Care discussed with patient Disposition Plan:  Status is: Inpatient  Remains inpatient appropriate because:IV treatments appropriate due to intensity of illness or inability to take PO   Dispo: The patient is from: Home              Anticipated d/c is to: Home              Anticipated d/c date is: 2 days              Patient currently is not medically stable to d/c.         Consultants:   None  Procedures:   Ultrasound: Cholelithiasis no cholecystitis  Antimicrobials:    Subjective: He is alert, denies headache. He reported polyuria polydipsia.  Objective: Vitals:   04/13/20 0330 04/13/20 0500 04/13/20 0600 04/13/20 0700  BP: 137/80 (!) 172/116 127/87 (!) 137/93  Pulse: 87 93 85 86  Resp: (!) 21 (!) _1 Temp:      TempSrc:      SpO2: 91% 90% 96% 92%    Intake/Output Summary (Last 24 hours) at 04/13/2020 0805 Last data filed at 04/13/2020 0446 Gross per 24 hour  Intake 2000 ml  Output 800 ml  Net  1200 ml   There were no vitals filed for this visit.  Examination:  General exam: Appears calm and comfortable  Respiratory system: Clear to auscultation. Respiratory effort normal. Cardiovascular system: S1 & S2 heard, RRR. No JVD, murmurs, rubs, gallops or clicks. No pedal edema. Gastrointestinal system: Abdomen is nondistended, soft and nontender. No organomegaly or masses felt. Normal bowel sounds heard. Central nervous system: Alert and oriented.  Hematoma in the temporal  area status of sutures Extremities: Symmetric 5 x 5 power. Skin: No rashes, lesions or ulcers    Data Reviewed: I have personally reviewed following labs and imaging studies  CBC: Recent Labs  Lab 04/13/20 0100 04/13/20 0334  WBC 9.5  --   NEUTROABS 8.3*  --   HGB 13.1 13.6  HCT 43.4 40.0  MCV 110.7*  --   PLT 209  --    Basic Metabolic Panel: Recent Labs  Lab 04/13/20 0100 04/13/20 0320 04/13/20 0334  NA 119*  --  123*  K 4.7  --  5.2*  CL 78*  --   --   CO2 22  --   --   GLUCOSE 1,400*  --   --   BUN 17  --   --   CREATININE 1.93*  --   --   CALCIUM 10.3  --   --   MG  --  1.8  --   PHOS  --  3.3  --    GFR: CrCl cannot be calculated (Unknown ideal weight.). Liver Function Tests: Recent Labs  Lab 04/13/20 0320  AST 27  ALT 25  ALKPHOS 68  BILITOT 2.0*  PROT 7.9  ALBUMIN 3.8   No results for input(s): LIPASE, AMYLASE in the last 168 hours. No results for input(s): AMMONIA in the last 168 hours. Coagulation Profile: No results for input(s): INR, PROTIME in the last 168 hours. Cardiac Enzymes: No results for input(s): CKTOTAL, CKMB, CKMBINDEX, TROPONINI in the last 168 hours. BNP (last 3 results) No results for input(s): PROBNP in the last 8760 hours. HbA1C: No results for input(s): HGBA1C in the last 72 hours. CBG: Recent Labs  Lab 04/13/20 0519 04/13/20 0542 04/13/20 0615 04/13/20 0643 04/13/20 0718  GLUCAP >600* >600* >600* >600* >600*   Lipid Profile: No results for input(s): CHOL, HDL, LDLCALC, TRIG, CHOLHDL, LDLDIRECT in the last 72 hours. Thyroid Function Tests: No results for input(s): TSH, T4TOTAL, FREET4, T3FREE, THYROIDAB in the last 72 hours. Anemia Panel: No results for input(s): VITAMINB12, FOLATE, FERRITIN, TIBC, IRON, RETICCTPCT in the last 72 hours. Sepsis Labs: No results for input(s): PROCALCITON, LATICACIDVEN in the last 168 hours.  Recent Results (from the past 240 hour(s))  SARS Coronavirus 2 by RT PCR (hospital  order, performed in [facility] hospital lab) Nasopharyngeal Nasopharyngeal Swab     Status: None   Collection Time: 04/13/20  4:09 AM   Specimen: Nasopharyngeal Swab  Result Value Ref Range Status   SARS Coronavirus 2 NEGATIVE NEGATIVE Final    Comment: (NOTE) SARS-CoV-2 target nucleic acids are NOT DETECTED. The SARS-CoV-2 RNA is generally detectable in upper and lower respiratory specimens during the acute phase of infection. The lowest concentration of SARS-CoV-2 viral copies this assay can detect is 250 copies / mL. A negative result does not preclude SARS-CoV-2 infection and should not be used as the sole basis for treatment or other patient management decisions.  A negative result may occur with improper specimen collection / handling, submission of specimen other than nasopharyngeal swab, presence  of viral mutation(s) within the areas targeted by this assay, and inadequate number of viral copies (<250 copies / mL). A negative result must be combined with clinical observations, patient history, and epidemiological information. Fact Sheet for Patients:   StrictlyIdeas.no Fact Sheet for Healthcare Providers: BankingDealers.co.za This test is not yet approved or cleared  by the [place] FDA and has been authorized for detection and/or diagnosis of SARS-CoV-2 by FDA under an Emergency Use Authorization (EUA).  This EUA will remain in effect (meaning this test can be used) for the duration of the COVID-19 declaration under Section 564(b)(1) of the Act, 21 U.S.C. section 360bbb-3(b)(1), unless the authorization is terminated or revoked sooner. Performed at [facility], [place] [address], [place], [place] [address]          Radiology Studies: CT Head Wo Contrast  Addendum Date: 04/13/2020   ADDENDUM REPORT: 04/13/2020 02:42 ADDENDUM: These results were called by telephone at the time of interpretation on 04/13/2020 at 2:42  am to provider DAVID [facility] , who verbally acknowledged these results. Electronically Signed   By: Lovena Le M.D.   On: 04/13/2020 02:42   Result Date: 04/13/2020 CLINICAL DATA:  Slipped and fell with positive head strike, laceration to the right temple EXAM: CT HEAD WITHOUT CONTRAST CT CERVICAL SPINE WITHOUT CONTRAST TECHNIQUE: Multidetector CT imaging of the head and cervical spine was performed following the standard protocol without intravenous contrast. Multiplanar CT image reconstructions of the cervical spine were also generated. COMPARISON:  Maxillofacial CT 05/03/2012, CT neck 05/04/2010 FINDINGS: CT HEAD FINDINGS Brain: There is a trace amount of right hyperdense parafalcine thickening which may be calcification though is more indeterminate in the setting of trauma and absence of direct visual comparison (3/26). No other sites suspicious for intracranial hemorrhage. No extra-axial collection. No mass effect or midline shift. No CT evidence of large vascular territory infarct. No convincing features of hydrocephaly. Symmetric prominence of the ventricles, cisterns and sulci compatible with parenchymal volume loss. Patchy areas of white matter hypoattenuation are most compatible with chronic microvascular angiopathy. Vascular: Atherosclerotic calcification of the carotid siphons and intradural vertebral arteries. No hyperdense vessel. Skull: Extensive soft tissue swelling and thickening over the right temporoparietal region with soft tissue laceration and hematoma of the parietal scalp (3/25) measuring up to a maximal thickness of 9 mm. Overlying bandaging material is present. No subjacent calvarial fracture. No visible or suspected temporal bone fracture. Partial visualization of the reconstructive changes of the right mandible. Sinuses/Orbits: Periodontal disease including a periapical lucency of the right maxillary molars. Mural thickening in the right maxillary sinus is likely on a Don degenerative a  CIS. Remaining paranasal sinuses are predominantly clear. Small dependent bilateral mastoid effusions. Middle ear cavities are clear. Ossicular chains are normally configured. Debris in the right external auditory canal. Other: None CT CERVICAL SPINE FINDINGS Alignment: Cervical stabilization collar is in place at the time of examination. Preservation of the normal cervical lordosis. Likely degenerative anterolisthesis C4 on C5 with most pronounced facet changes at these levels. No evidence of traumatic listhesis. No abnormally widened, perched or jumped facets. Partial fusion of the posterior C2-3 vertebral bodies and the left articular facets. Normal alignment of the craniocervical and atlantoaxial articulations. Skull base and vertebrae: The osseous structures appear diffusely demineralized which may limit detection of small or nondisplaced fractures. Partial fusion of C2-3, as above. Advanced arthrosis of the C1-2 articulation anteriorly with some calcific pannus formation posterior to the dens. No visible skull base fracture.  No acute cervical spine fracture or vertebral body height loss. No worrisome osseous lesions. Soft tissues and spinal canal: No pre or paravertebral fluid or swelling. No visible canal hematoma. Disc levels: Multilevel intervertebral disc height loss with spondylitic endplate changes. Small posterior disc osteophyte complexes at several levels without significant canal stenosis. Uncinate spurring and facet hypertrophic changes result in mild-to-moderate multilevel foraminal narrowing most pronounced at C4-5. Upper chest: Some biapical pleuroparenchymal scarring is noted. No acute abnormality in the lung apices. Other: Extensive cervical carotid atherosclerosis. Diminutive appearance of the thyroid. Paucity of subcutaneous fat. IMPRESSION: CT HEAD 1. Trace amount of right hyperdense parafalcine thickening which may be calcification though is more indeterminate in the setting of trauma and  absence of direct visual comparison. Consider short-term (6 hour) interval follow-up to assess for stability or resolution. 2. No other acute intracranial abnormality. Background parenchymal volume loss and chronic microvascular angiopathy. 3. Extensive soft tissue swelling and thickening over the right temporoparietal region with soft tissue laceration and hematoma of the parietal scalp. No subjacent calvarial fracture. CT CERVICAL SPINE 1. No evidence of acute fracture or traumatic listhesis of the cervical spine. Osseous structures appear diffusely demineralized which may limit detection of small or nondisplaced fractures. 2. Multilevel degenerative disc disease and facet hypertrophic changes of the cervical spine, detailed above. 3. Extensive cervical carotid atherosclerosis. Currently attempting to contact the ordering provider with a critical value result. Addendum will be submitted upon case discussion. Electronically Signed: By: Lovena Le M.D. On: 04/13/2020 02:33   CT Cervical Spine Wo Contrast  Addendum Date: 04/13/2020   ADDENDUM REPORT: 04/13/2020 02:42 ADDENDUM: These results were called by telephone at the time of interpretation on 04/13/2020 at 2:42 am to provider DAVID [facility] , who verbally acknowledged these results. Electronically Signed   By: Lovena Le M.D.   On: 04/13/2020 02:42   Result Date: 04/13/2020 CLINICAL DATA:  Slipped and fell with positive head strike, laceration to the right temple EXAM: CT HEAD WITHOUT CONTRAST CT CERVICAL SPINE WITHOUT CONTRAST TECHNIQUE: Multidetector CT imaging of the head and cervical spine was performed following the standard protocol without intravenous contrast. Multiplanar CT image reconstructions of the cervical spine were also generated. COMPARISON:  Maxillofacial CT 05/03/2012, CT neck 05/04/2010 FINDINGS: CT HEAD FINDINGS Brain: There is a trace amount of right hyperdense parafalcine thickening which may be calcification though is more  indeterminate in the setting of trauma and absence of direct visual comparison (3/26). No other sites suspicious for intracranial hemorrhage. No extra-axial collection. No mass effect or midline shift. No CT evidence of large vascular territory infarct. No convincing features of hydrocephaly. Symmetric prominence of the ventricles, cisterns and sulci compatible with parenchymal volume loss. Patchy areas of white matter hypoattenuation are most compatible with chronic microvascular angiopathy. Vascular: Atherosclerotic calcification of the carotid siphons and intradural vertebral arteries. No hyperdense vessel. Skull: Extensive soft tissue swelling and thickening over the right temporoparietal region with soft tissue laceration and hematoma of the parietal scalp (3/25) measuring up to a maximal thickness of 9 mm. Overlying bandaging material is present. No subjacent calvarial fracture. No visible or suspected temporal bone fracture. Partial visualization of the reconstructive changes of the right mandible. Sinuses/Orbits: Periodontal disease including a periapical lucency of the right maxillary molars. 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 Remaining paranasal sinuses are predominantly clear. Small dependent bilateral mastoid effusions. Middle ear cavities are clear. Ossicular chains are normally configured. Debris in the  right external auditory canal. Other: None CT CERVICAL SPINE FINDINGS Alignment: Cervical stabilization collar is in place at the time of examination. Preservation of the normal cervical lordosis. Likely degenerative anterolisthesis C4 on C5 with most pronounced facet changes at these levels. No evidence of traumatic listhesis. No abnormally widened, perched or jumped facets. Partial fusion of the posterior C2-3 vertebral bodies and the left articular facets. Normal alignment of the craniocervical and atlantoaxial articulations. Skull base and vertebrae:  The osseous structures appear diffusely demineralized which may limit detection of small or nondisplaced fractures. Partial fusion of C2-3, as above. Advanced arthrosis of the C1-2 articulation anteriorly with some calcific pannus formation posterior to the dens. No visible skull base fracture. No acute cervical spine fracture or vertebral body height loss. No worrisome osseous lesions. Soft tissues and spinal canal: No pre or paravertebral fluid or swelling. No visible canal hematoma. Disc levels: Multilevel intervertebral disc height loss with spondylitic endplate changes. Small posterior disc osteophyte complexes at several levels without significant canal stenosis. Uncinate spurring and facet hypertrophic changes result in mild-to-moderate multilevel foraminal narrowing most pronounced at C4-5. Upper chest: Some biapical pleuroparenchymal scarring is noted. No acute abnormality in the lung apices. Other: Extensive cervical carotid atherosclerosis. Diminutive appearance of the thyroid. Paucity of subcutaneous fat. IMPRESSION: CT HEAD 1. Trace amount of right hyperdense parafalcine thickening which may be calcification though is more indeterminate in the setting of trauma and absence of direct visual comparison. Consider short-term (6 hour) interval follow-up to assess for stability or resolution. 2. No other acute intracranial abnormality. Background parenchymal volume loss and chronic microvascular angiopathy. 3. Extensive soft tissue swelling and thickening over the right temporoparietal region with soft tissue laceration and hematoma of the parietal scalp. No subjacent calvarial fracture. CT CERVICAL SPINE 1. No evidence of acute fracture or traumatic listhesis of the cervical spine. Osseous structures appear diffusely demineralized which may limit detection of small or nondisplaced fractures. 2. Multilevel degenerative disc disease and facet hypertrophic changes of the cervical spine, detailed above. 3.  Extensive cervical carotid atherosclerosis. Currently attempting to contact the ordering provider with a critical value result. Addendum will be submitted upon case discussion. Electronically Signed: By: Lovena Le M.D. On: 04/13/2020 02:33   DG CHEST PORT 1 VIEW  Result Date: 04/13/2020 CLINICAL DATA:  Cough EXAM: PORTABLE CHEST 1 VIEW COMPARISON:  06/13/2012 FINDINGS: Heart is normal size. Mild peribronchial thickening. No confluent opacities or effusions. No acute bony abnormality. IMPRESSION: Mild bronchitic changes. Electronically Signed   By: Rolm Baptise M.D.   On: 04/13/2020 07:07   US Abdomen Limited RUQ  Result Date: 04/13/2020 CLINICAL DATA:  Elevated serum bilirubin EXAM: ULTRASOUND ABDOMEN LIMITED RIGHT UPPER QUADRANT COMPARISON:  None. FINDINGS: Gallbladder: Multiple shadowing gallstones. No gallbladder wall thickening, pericholecystic edema, or focal tenderness. Echogenic areas with subtle ring down artifact is likely floating cholesterol crystals and adenomyosis. Common bile duct: Diameter: 5 mm.  Where visualized, no filling defect. Liver: No focal lesion identified. Within normal limits in parenchymal echogenicity. Portal vein is patent on color Doppler imaging with normal direction of blood flow towards the liver. Other: Incidental small right upper pole renal cyst. IMPRESSION: Cholelithiasis without findings of acute cholecystitis. Electronically Signed   By: Monte Fantasia M.D.   On: 04/13/2020 07:05        Scheduled Meds: Continuous Infusions: . sodium chloride 200 mL/hr at 04/13/20 6045  . insulin 7.5 Units/hr (04/13/20 0431)  . sodium chloride       LOS:  0 days    Time spent: 35 minutes    Charie Pinkus A Tyrell Antonio, MD [facility]   If 7PM-7AM, please contact night-coverage [email or web address]  04/13/2020, 8:05 AM

## 2020-04-13 NOTE — Progress Notes (Signed)
Patient's CBG 196. Insulin gtt titrated to 3.6.

## 2020-04-13 NOTE — Consult Note (Addendum)
[facility] [place] CONSULT NOTE   MRN : [id number]  Reason for Consult: Asymptomatic right ICA stenosis Referring Physician: ED  History of Present Illness: 69 year old male brought to the ED s/p fall sustaining head wound.  During the work up he was noted to have right ICA stenosis via carotid duplex study 80-99 % calcified and irregular.    He denise history of stroke or TIA.  He denise weakness in his upper or lower extremities, amaurosis or aphasia.  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    Past medical history includes: HIV, TB, CKD, HTN, Neuromuscular disorder, and hyperlipidemia.     Current Facility-Administered Medications  Medication Dose Route Frequency Provider Last Rate Last Admin  . 0.9 %  sodium chloride infusion   Intravenous Continuous Regalado, Belkys A, MD 100 mL/hr at 04/13/20 1527 Rate Change at 04/13/20 1527  . abacavir-dolutegravir-lamiVUDine (TRIUMEQ) [id number] MG per tablet 1 tablet  1 tablet Oral Daily Regalado, Belkys A, MD   Stopped at 04/13/20 1528  . acetaminophen (TYLENOL) tablet 650 mg  650 mg Oral Q6H PRN Shela Leff, MD       Or  . acetaminophen (TYLENOL) suppository 650 mg  650 mg Rectal Q6H PRN Shela Leff, MD      . albuterol (PROVENTIL) (2.5 MG/3ML) 0.083% nebulizer solution 2.5 mg  2.5 mg Nebulization Q6H PRN Regalado, Belkys A, MD      . clonazePAM (KLONOPIN) tablet 1 mg  1 mg Oral TID PRN Regalado, Belkys A, MD      . dextrose 50 % solution 0-50 mL  0-50 mL Intravenous PRN Shela Leff, MD      . FLUoxetine (PROZAC) capsule 20 mg  20 mg Oral Daily Regalado, Belkys A, MD   20 mg at 04/13/20 1459  . folic acid (FOLVITE) tablet 1 mg  1 mg Oral Daily Regalado, Belkys A, MD   1 mg at 04/13/20 1459  . insulin regular, human (MYXREDLIN) 100 units/ 100 mL infusion   Intravenous Continuous Shela Leff, MD 3.8 mL/hr at 04/13/20 1616 3.8 Units/hr at 04/13/20 1616  . [START ON  04/14/2020] levothyroxine (SYNTHROID) tablet 50 mcg  50 mcg Oral QAC breakfast Regalado, Belkys A, MD      . potassium chloride SA (KLOR-CON) CR tablet 20 mEq  20 mEq Oral Once Niel Hummer A, MD   Stopped at 04/13/20 1621  . temazepam (RESTORIL) capsule 30 mg  30 mg Oral QHS Regalado, Belkys A, MD      . vitamin B-12 (CYANOCOBALAMIN) tablet 100 mcg  100 mcg Oral Daily Regalado, Belkys A, MD   Stopped at 04/13/20 1528   Current Outpatient Medications  Medication Sig Dispense Refill  . clonazePAM (KLONOPIN) 1 MG tablet TAKE 1 TABLET 3 TIMES A DAY AS NEEDED FOR ANXIETY (Patient taking differently: Take 1 mg by mouth 3 (three) times daily as needed for anxiety. ) 90 tablet 2  . diphenhydrAMINE (BENADRYL) 25 MG tablet Take 50 mg by mouth every 6 (six) hours as needed.    . fenofibrate (TRICOR) 145 MG tablet TAKE ONE TABLET BY MOUTH DAILY (Patient taking differently: Take 145 mg by mouth daily. ) 90 tablet 0  . FLUoxetine (PROZAC) 20 MG capsule TAKE ONE CAPSULE BY MOUTH DAILY (Patient taking differently: Take 20 mg by mouth daily. ) 90 capsule 0  . levothyroxine (SYNTHROID, LEVOTHROID) 50 MCG tablet Take 1 tablet (50 mcg total) by mouth daily before breakfast. 30  tablet 0  . metoCLOPramide (REGLAN) 5 MG tablet TAKE ONE TABLET BY MOUTH THREE TIMES A DAY BEFORE MEALS (Patient taking differently: Take 5 mg by mouth 3 (three) times daily before meals. ) 90 tablet 5  . pantoprazole (PROTONIX) 40 MG tablet TAKE ONE TABLET BY MOUTH DAILY (Patient taking differently: Take 40 mg by mouth daily. ) 90 tablet 2  . temazepam (RESTORIL) 30 MG capsule Take 30 mg by mouth at bedtime.  5  . TRIUMEQ 600-50-300 MG tablet TAKE ONE TABLET BY MOUTH DAILY (Patient taking differently: Take 1 tablet by mouth daily. ) 90 tablet 3  . [DISCONTINUED] Dolutegravir Sodium (TIVICAY) 50 MG TABS Take 1 tablet (50 mg total) by mouth daily. 30 tablet 11    Pt meds include: Statin :No Betablocker: No ASA: No Other  anticoagulants/antiplatelets: none  Past Medical History:  Diagnosis Date  . Anxiety   . Cataract   . Chronic kidney disease   . GERD (gastroesophageal reflux disease)   . Heart murmur   . HIV DISEASE 12/01/2006  . HYPERLIPIDEMIA, WITH LOW HDL 01/03/2008  . Hypertension   . HYPERTENSION 12/01/2006  . HYPOGONADISM 12/23/2009  . Neuromuscular disorder ([place])   . SYPHILIS, [place], LATENT NOS 01/18/2007  . Thyroid disease   . Tonsillar cancer ([place])    tonsillar ca   . Tuberculosis    history of TB about 30 years ago    Past Surgical History:  Procedure Laterality Date  . COLONOSCOPY    . TONSILECTOMY, ADENOIDECTOMY, BILATERAL MYRINGOTOMY AND TUBES     tonsil cancer   . UPPER GASTROINTESTINAL ENDOSCOPY      Social History Social History   Tobacco Use  . Smoking status: Former Research scientist (life sciences)  . Smokeless tobacco: Never Used  . Tobacco comment: quit when he was 69 years old.  Substance Use Topics  . Alcohol use: No    Alcohol/week: 2.0 standard drinks    Types: 2 Shots of liquor per week    Comment: very rare   . Drug use: No    Comment: 40 years ago used multiple drugs    Family History Family History  Problem Relation Age of Onset  . Diabetes Mother   . COPD Mother   . Heart attack Father   . Colon cancer Neg Hx   . Colon polyps Neg Hx   . Esophageal cancer Neg Hx   . Rectal cancer Neg Hx   . Stomach cancer Neg Hx     Allergies  Allergen Reactions  . Codeine Other (See Comments)    "makes me crazy"  . Sulfamethoxazole-Trimethoprim Itching and Other (See Comments)    "makes me crazy"  . Sulfonamide Derivatives Itching and Other (See Comments)    "makes me crazy"     REVIEW OF SYSTEMS  General: [ ]  Weight loss, [ ]  Fever, [ ]  chills Neurologic: [ ]  Dizziness, [ ]  Blackouts, [ ]  Seizure [ ]  Stroke, [ ]  "Mini stroke", [ ]  Slurred speech, [ ]  Temporary blindness; [ ]  weakness in arms or legs, [ ]  Hoarseness [ ]  Dysphagia Cardiac: [ ]  Chest pain/pressure, [ ]   Shortness of breath at rest [ ]  Shortness of breath with exertion, [ ]  Atrial fibrillation or irregular heartbeat  Vascular: [ ]  Pain in legs with walking, [ ]  Pain in legs at rest, [ ]  Pain in legs at night,  [ ]  Non-healing ulcer, [ ]  Blood clot in vein/DVT,   Pulmonary: [ ]  Home oxygen, [ ]  Productive  cough, [ ]  Coughing up blood, [ ]  Asthma,  [ ]  Wheezing [ ]  COPD Musculoskeletal:  [ ]  Arthritis, [ ]  Low back pain, [ ]  Joint pain Hematologic: [ ]  Easy Bruising, [ ]  Anemia; [ ]  Hepatitis Gastrointestinal: [ ]  Blood in stool, [ ]  Gastroesophageal Reflux/heartburn, Urinary: [ ]  chronic Kidney disease, [ ]  on HD - [ ]  MWF or [ ]  TTHS, [ ]  Burning with urination, [ ]  Difficulty urinating Skin: [ ]  Rashes, [ ]  Wounds Psychological: [ ]  Anxiety, [ ]  Depression  Physical Examination Vitals:   04/13/20 1400 04/13/20 1430 04/13/20 1524 04/13/20 1650  BP: 109/68 (!) 123/92    Pulse: 76 74 72 78  Resp: 17 (!) 22 16 16   Temp:      TempSrc:      SpO2: 95% 98% 99% 97%   There is no height or weight on file to calculate BMI.  General:  WDWN in NAD HENT: WNL, head dressing covering head laceration Eyes: Pupils equal Pulmonary: normal non-labored breathing Cardiac: RRR,  Abdomen: soft, NT, no masses Skin: no rashes, ulcers noted;  no Gangrene , no cellulitis; no open wounds;   Right neck skin adhered to subcutaious tissue.  Post radiation to the neck.  Vascular Exam/Pulses:Palpable radial, DP/PT pulses B   Musculoskeletal: no muscle wasting or atrophy; no edema  Neurologic: A&O X 3; Appropriate Affect ;  SENSATION: normal; MOTOR FUNCTION: 5/5 Symmetric Speech is fluent/normal   Significant Diagnostic Studies: CBC Lab Results  Component Value Date   WBC 9.5 04/13/2020   HGB 13.6 04/13/2020   HCT 40.0 04/13/2020   MCV 110.7 (H) 04/13/2020   PLT 209 04/13/2020    BMET    Component Value Date/Time   NA 138 04/13/2020 1414   NA 140 10/31/2011 1039   K 3.6 04/13/2020 1414   K  4.4 10/31/2011 1039   CL 102 04/13/2020 1414   CL 100 10/31/2011 1039   CO2 27 04/13/2020 1414   CO2 30 10/31/2011 1039   GLUCOSE 289 (H) 04/13/2020 1414   GLUCOSE 97 10/31/2011 1039   BUN 14 04/13/2020 1414   BUN 18 10/31/2011 1039   CREATININE 1.30 (H) 04/13/2020 1414   CREATININE 1.20 09/20/2019 1116   CALCIUM 9.3 04/13/2020 1414   CALCIUM 9.4 10/31/2011 1039   GFRNONAA 55 (L) 04/13/2020 1414   GFRNONAA 43 (L) 09/04/2018 1154   GFRAA >60 04/13/2020 1414   GFRAA 50 (L) 09/04/2018 1154   CrCl cannot be calculated (Unknown ideal weight.).  COAG Lab Results  Component Value Date   INR 1.08 07/07/2010     Non-Invasive Vascular Imaging:     Right Carotid Findings:  +----------+--------+--------+--------+---------------------+--------------  ----+       PSV cm/sEDV cm/sStenosisPlaque Description  Comments        +----------+--------+--------+--------+---------------------+--------------  ----+  CCA Prox 112   11                  intimal  thickening  +----------+--------+--------+--------+---------------------+--------------  ----+  CCA Distal34   6                   intimal  thickening  +----------+--------+--------+--------+---------------------+--------------  ----+  ICA Prox 332   110   80-99% calcific and                                  irregular                  +----------+--------+--------+--------+---------------------+--------------  ----+  ICA Mid  163   66                              +----------+--------+--------+--------+---------------------+--------------  ----+  ICA Distal93   24                              +----------+--------+--------+--------+---------------------+--------------  ----+  ECA    162   17                               +----------+--------+--------+--------+---------------------+--------------  ----+   +----------+--------+-------+--------+-------------------+       PSV cm/sEDV cmsDescribeArm Pressure (mmHG)  +----------+--------+-------+--------+-------------------+  [id number]                       +----------+--------+-------+--------+-------------------+   +---------+--------+--+--------+--+  VertebralPSV cm/s53EDV cm/s16  +---------+--------+--+--------+--+      Left Carotid Findings:  +----------+--------+--------+--------+------------------+-----------------  -+       PSV cm/sEDV cm/sStenosisPlaque DescriptionComments        +----------+--------+--------+--------+------------------+-----------------  -+  CCA Prox 93   28                intimal  thickening  +----------+--------+--------+--------+------------------+-----------------  -+  CCA Distal79   21                intimal  thickening  +----------+--------+--------+--------+------------------+-----------------  -+  ICA Prox 45   21       calcific     Shadowing        +----------+--------+--------+--------+------------------+-----------------  -+  ICA Distal126   57                turbulent flow     +----------+--------+--------+--------+------------------+-----------------  -+  ECA    128   21                            +----------+--------+--------+--------+------------------+-----------------  -+    +---------+--------+--+--------+--+  VertebralPSV cm/s88EDV cm/s25  +---------+--------+--+--------+--+        Summary:  Right Carotid: Velocities in the right ICA are consistent with a 80-99%         stenosis.   Left Carotid: Velocities in the left ICA are  consistent with a 1-39%  stenosis.        Calcific plaque could obscure higher velocities.   Vertebrals: Bilateral vertebral arteries demonstrate antegrade flow.  Subclavians: Left subclavian artery was not visualized. Normal flow  hemodynamics        were seen in the right subclavian artery. Clavicle.  ASSESSMENT/PLAN:  History of right neck radiation for tonsillar cancer date unknown. Right ICA stenosis Asymptomatic for stroke and TIA.  We will order a neck and head CTA tomorrow.  The right neck skin is adhered to the subcutaneous tissue, this would make an open CEA very difficult.  He may be a candidate for TCAR.    He can follow with our office in 2-4 weeks to see Dr. Oneida Alar.  He states he has pending dental work for implants in the near future and would like to get this resolved first.   Recommend he start Aspirin 81 mg and Statin daily.   Roxy Horseman 04/13/2020 5:34 PM   Agree with above.  Patient with incidental finding of right internal carotid artery stenosis.  No evidence of TIA amaurosis or  stroke.  His fall and scalp laceration seem to be more due to neuropathy and a casual event of tripping and ground-level fall.  Most likely his carotid stenosis is secondary to radiation.  He has thickened fixed skin on the right neck which would make carotid endarterectomy more difficult.  The patient is also scheduled to have multiple teeth extraction in the near future.  We will obtain CT angio of the head and neck to evaluate for TCAR stenting.  After the patient has a CT scan we will schedule him for follow-up with me in 2 to 4 weeks to determine whether or not he would be a candidate for carotid intervention.  I discussed with the patient his risk of stroke currently would be about 5 %/year.  We will start him on aspirin and a statin today.  I will follow up on his CT results.  Ruta Hinds, MD [facility] Office:  [phone]

## 2020-04-13 NOTE — ED Notes (Signed)
Patient taken to US.

## 2020-04-13 NOTE — ED Notes (Signed)
Patient taken to CT.

## 2020-04-13 NOTE — Progress Notes (Signed)
Christopher [name] [id number] Admission Data: 04/13/2020 6:57 PM Attending Provider: Elmarie Shiley, MD  [id number], Jori Moll, MD  [name] is a 69 y.o. male patient admitted from ED awake, alert  & orientated  X 3,  DNR, VSS - Blood pressure 128/76, pulse 76, temperature 97.7 F (36.5 C), temperature source Oral, resp. rate 18, SpO2 98 %., O2 No c/o shortness of breath, no c/o chest pain, no distress noted. Tele # [id number] placed and pt is currently running:normal sinus rhythm.  Pt orientation to unit, room and routine. Information packet given to patient/family.  Admission INP armband ID verified with patient/family, and in place. SR up x 2, fall risk assessment complete with patient and family verbalizing understanding of risks associated with falls. Pt verbalizes an understanding of how to use the call bell and to call for help before getting out of bed. Head laceration not visualized as dressing intact, scabs noted to RLE but other wise skin clean, dry and intact.  Will continue to monitor and assist as needed.  Hiram Comber, RN 04/13/2020 6:57 PM

## 2020-04-13 NOTE — H&P (Signed)
History and Physical    MARCIO FURTADO [id number] DOB: 06-19-1951 DOA: 04/13/2020  PCP: [name], MD Patient coming from: Home  Chief Complaint: Fall, head injury  HPI: Christopher [name] is a 69 y.o. male with medical history significant of anxiety, CKD 3, GERD, HIV, hypertension, hyperlipidemia, neuromuscular disorder, hypothyroidism, history of tonsillar cancer, history of TB presenting for evaluation of head injury after a mechanical fall.  Patient states he has neuropathy and difficulty feeling his feet.  He normally has to hold onto objects when walking.  He does not use a walker at home.  Last night he had to go use the bathroom and while walking in the house he lost his balance and fell.  States he normally has to hold onto the wall when going to the bathroom but somehow missed the wall because it was dark which caused him to fall.  A heavy lamp fell on his head when he fell.  He did not lose consciousness.  Denies any preceding lightheadedness/dizziness, palpitations, chest pain, or shortness of breath.  Denies headache, neck, or back pain.  Denies sustaining any other injuries from the fall.  Denies ethanol use.  States 2 years ago his doctor told him he was a prediabetic.  For the past 1 month he has been drinking a lot of sweet tea throughout the day and waking up at the night to drink more.  He has been urinating a lot.  Also reports having lower abdominal cramps and nonbloody diarrhea for the past few days.  No recent laxative or antibiotic use.  Denies nausea or vomiting.  Reports having a chronic cough.  He does not smoke cigarettes.  Denies chest pain or shortness of breath.  ED Course: Afebrile.  Slightly tachycardic and tachypneic.  Not hypotensive.  Labs show no leukocytosis.  Hemoglobin 13.1, MCV 110.  Blood glucose significantly elevated at 1400.  Anion gap mildly elevated at 19.  Bicarb normal at 22.  VBG with pH 7.44.  UA without ketones.  Serum osmolarity pending.  A1c  pending.  Potassium 4.7.  BUN 17, creatinine 1.9.  Baseline creatinine 1.2.  T bili 2.0, remainder of LFTs normal.  Mag and Phos levels normal.  Screening SARS-CoV-2 PCR test pending.  Head CT showing trace amount of right hyperdense parafalcine thickening which radiologist feels is due to calcification though is indeterminate in the setting of trauma.  Recommending repeat head CT in 6 hours to assess for stability or resolution.  No other acute intracranial abnormality.  Extensive soft tissue swelling and thickening over the right temporoparietal region with soft tissue laceration and hematoma of the parietal scalp.  No subjacent calvarial fracture.  CT C-spine negative for acute fracture or traumatic listhesis.  Also showing extensive cervical carotid atherosclerosis.  Scalp laceration stapled in the ED.  Patient was given 1 L normal saline bolus and started on insulin infusion.  Review of Systems:  All systems reviewed and apart from history of presenting illness, are negative.  Past Medical History:  Diagnosis Date  . Anxiety   . Cataract   . Chronic kidney disease   . GERD (gastroesophageal reflux disease)   . Heart murmur   . HIV DISEASE 12/01/2006  . HYPERLIPIDEMIA, WITH LOW HDL 01/03/2008  . Hypertension   . HYPERTENSION 12/01/2006  . HYPOGONADISM 12/23/2009  . Neuromuscular disorder ([place])   . SYPHILIS, [place], LATENT NOS 01/18/2007  . Thyroid disease   . Tonsillar cancer ([place])    tonsillar ca   .  Tuberculosis    history of TB about 30 years ago    Past Surgical History:  Procedure Laterality Date  . COLONOSCOPY    . TONSILECTOMY, ADENOIDECTOMY, BILATERAL MYRINGOTOMY AND TUBES     tonsil cancer   . UPPER GASTROINTESTINAL ENDOSCOPY       reports that he has quit smoking. He has never used smokeless tobacco. He reports that he does not drink alcohol or use drugs.  Allergies  Allergen Reactions  . Codeine Other (See Comments)    "makes me crazy"  .  Sulfamethoxazole-Trimethoprim Itching and Other (See Comments)    "makes me crazy"  . Sulfonamide Derivatives Itching and Other (See Comments)    "makes me crazy"    Family History  Problem Relation Age of Onset  . Diabetes Mother   . COPD Mother   . Heart attack Father   . Colon cancer Neg Hx   . Colon polyps Neg Hx   . Esophageal cancer Neg Hx   . Rectal cancer Neg Hx   . Stomach cancer Neg Hx     Prior to Admission medications   Medication Sig Start Date End Date Taking? Authorizing Provider  chlorhexidine (PERIDEX) 0.12 % solution Use as directed 15 mLs in the mouth or throat 2 (two) times daily.    [provider]  clonazePAM (KLONOPIN) 1 MG tablet TAKE 1 TABLET 3 TIMES A DAY AS NEEDED FOR ANXIETY    Michel Bickers, MD  fenofibrate (TRICOR) 145 MG tablet TAKE ONE TABLET BY MOUTH DAILY 02/07/20   Michel Bickers, MD  FLUoxetine (PROZAC) 20 MG capsule TAKE ONE CAPSULE BY MOUTH DAILY 04/08/20   Michel Bickers, MD  levothyroxine (SYNTHROID, LEVOTHROID) 50 MCG tablet Take 1 tablet (50 mcg total) by mouth daily before breakfast. 06/04/14   Heath Lark, MD  metoCLOPramide (REGLAN) 5 MG tablet TAKE ONE TABLET BY MOUTH THREE TIMES A DAY BEFORE MEALS 09/23/19   Doran Stabler, MD  pantoprazole (PROTONIX) 40 MG tablet TAKE ONE TABLET BY MOUTH DAILY 03/09/20   Doran Stabler, MD  temazepam (RESTORIL) 30 MG capsule Take 30 mg by mouth at bedtime. 07/26/16   [provider]  TRIUMEQ 600-50-300 MG tablet TAKE ONE TABLET BY MOUTH DAILY 12/23/19   Michel Bickers, MD  Dolutegravir Sodium (TIVICAY) 50 MG TABS Take 1 tablet (50 mg total) by mouth daily. 12/05/12 12/05/28  Truman Hayward, MD    Physical Exam: Vitals:   04/13/20 0230 04/13/20 0330 04/13/20 0500 04/13/20 0600  BP: (!) 177/87 137/80 (!) 172/116 127/87  Pulse: (!) 102 87 93 85  Resp: (!) 27 (!) 21 (!) 24 20  Temp:      TempSrc:      SpO2: (!) 80% 91% 90% 96%    Physical Exam  Constitutional: He is oriented  to person, place, and time. No distress.  Cachectic  HENT:  Head: Normocephalic.  Bandage over head laceration Dry mucous membranes  Eyes: Pupils are equal, round, and reactive to light. EOM are normal.  Cardiovascular: Normal rate, regular rhythm and intact distal pulses.  Pulmonary/Chest: Effort normal and breath sounds normal. No respiratory distress. He has no wheezes. He has no rales.  Abdominal: Soft. Bowel sounds are normal. He exhibits no distension. There is no abdominal tenderness. There is no guarding.  Musculoskeletal:        General: No edema.     Cervical back: Neck supple.  Neurological: He is alert and oriented to person, place,  and time. No cranial nerve deficit.  Speech fluent, tongue midline, no facial droop. No focal motor or sensory deficit.  Skin: Skin is warm and dry. He is not diaphoretic.    Labs on Admission: I have personally reviewed following labs and imaging studies  CBC: Recent Labs  Lab 04/13/20 0100 04/13/20 0334  WBC 9.5  --   NEUTROABS 8.3*  --   HGB 13.1 13.6  HCT 43.4 40.0  MCV 110.7*  --   PLT 209  --    Basic Metabolic Panel: Recent Labs  Lab 04/13/20 0100 04/13/20 0320 04/13/20 0334  NA 119*  --  123*  K 4.7  --  5.2*  CL 78*  --   --   CO2 22  --   --   GLUCOSE 1,400*  --   --   BUN 17  --   --   CREATININE 1.93*  --   --   CALCIUM 10.3  --   --   MG  --  1.8  --   PHOS  --  3.3  --    GFR: CrCl cannot be calculated (Unknown ideal weight.). Liver Function Tests: Recent Labs  Lab 04/13/20 0320  AST 27  ALT 25  ALKPHOS 68  BILITOT 2.0*  PROT 7.9  ALBUMIN 3.8   No results for input(s): LIPASE, AMYLASE in the last 168 hours. No results for input(s): AMMONIA in the last 168 hours. Coagulation Profile: No results for input(s): INR, PROTIME in the last 168 hours. Cardiac Enzymes: No results for input(s): CKTOTAL, CKMB, CKMBINDEX, TROPONINI in the last 168 hours. BNP (last 3 results) No results for input(s): PROBNP  in the last 8760 hours. HbA1C: No results for input(s): HGBA1C in the last 72 hours. CBG: Recent Labs  Lab 04/13/20 0505 04/13/20 0519 04/13/20 0542 04/13/20 0615 04/13/20 0643  GLUCAP >600* >600* >600* >600* >600*   Lipid Profile: No results for input(s): CHOL, HDL, LDLCALC, TRIG, CHOLHDL, LDLDIRECT in the last 72 hours. Thyroid Function Tests: No results for input(s): TSH, T4TOTAL, FREET4, T3FREE, THYROIDAB in the last 72 hours. Anemia Panel: No results for input(s): VITAMINB12, FOLATE, FERRITIN, TIBC, IRON, RETICCTPCT in the last 72 hours. Urine analysis:    Component Value Date/Time   COLORURINE STRAW (A) 04/13/2020 0345   APPEARANCEUR CLEAR 04/13/2020 0345   LABSPEC 1.025 04/13/2020 0345   PHURINE 7.0 04/13/2020 0345   GLUCOSEU >=500 (A) 04/13/2020 0345   HGBUR NEGATIVE 04/13/2020 0345   BILIRUBINUR NEGATIVE 04/13/2020 0345   KETONESUR NEGATIVE 04/13/2020 0345   PROTEINUR NEGATIVE 04/13/2020 0345   NITRITE NEGATIVE 04/13/2020 0345   LEUKOCYTESUR NEGATIVE 04/13/2020 0345    Radiological Exams on Admission: CT Head Wo Contrast  Addendum Date: 04/13/2020   ADDENDUM REPORT: 04/13/2020 02:42 ADDENDUM: These results were called by telephone at the time of interpretation on 04/13/2020 at 2:42 am to provider DAVID [facility] , who verbally acknowledged these results. Electronically Signed   By: Lovena Le M.D.   On: 04/13/2020 02:42   Result Date: 04/13/2020 CLINICAL DATA:  Slipped and fell with positive head strike, laceration to the right temple EXAM: CT HEAD WITHOUT CONTRAST CT CERVICAL SPINE WITHOUT CONTRAST TECHNIQUE: Multidetector CT imaging of the head and cervical spine was performed following the standard protocol without intravenous contrast. Multiplanar CT image reconstructions of the cervical spine were also generated. COMPARISON:  Maxillofacial CT 05/03/2012, CT neck 05/04/2010 FINDINGS: CT HEAD FINDINGS Brain: There is a trace amount of right hyperdense parafalcine  thickening  which may be calcification though is more indeterminate in the setting of trauma and absence of direct visual comparison (3/26). No other sites suspicious for intracranial hemorrhage. No extra-axial collection. No mass effect or midline shift. No CT evidence of large vascular territory infarct. No convincing features of hydrocephaly. Symmetric prominence of the ventricles, cisterns and sulci compatible with parenchymal volume loss. Patchy areas of white matter hypoattenuation are most compatible with chronic microvascular angiopathy. Vascular: Atherosclerotic calcification of the carotid siphons and intradural vertebral arteries. No hyperdense vessel. Skull: Extensive soft tissue swelling and thickening over the right temporoparietal region with soft tissue laceration and hematoma of the parietal scalp (3/25) measuring up to a maximal thickness of 9 mm. Overlying bandaging material is present. No subjacent calvarial fracture. No visible or suspected temporal bone fracture. Partial visualization of the reconstructive changes of the right mandible. Sinuses/Orbits: Periodontal disease including a periapical lucency of the right maxillary molars. 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 Remaining paranasal sinuses are predominantly clear. Small dependent bilateral mastoid effusions. Middle ear cavities are clear. Ossicular chains are normally configured. Debris in the right external auditory canal. Other: None CT CERVICAL SPINE FINDINGS Alignment: Cervical stabilization collar is in place at the time of examination. Preservation of the normal cervical lordosis. Likely degenerative anterolisthesis C4 on C5 with most pronounced facet changes at these levels. No evidence of traumatic listhesis. No abnormally widened, perched or jumped facets. Partial fusion of the posterior C2-3 vertebral bodies and the left articular facets. Normal alignment of the craniocervical and  atlantoaxial articulations. Skull base and vertebrae: The osseous structures appear diffusely demineralized which may limit detection of small or nondisplaced fractures. Partial fusion of C2-3, as above. Advanced arthrosis of the C1-2 articulation anteriorly with some calcific pannus formation posterior to the dens. No visible skull base fracture. No acute cervical spine fracture or vertebral body height loss. No worrisome osseous lesions. Soft tissues and spinal canal: No pre or paravertebral fluid or swelling. No visible canal hematoma. Disc levels: Multilevel intervertebral disc height loss with spondylitic endplate changes. Small posterior disc osteophyte complexes at several levels without significant canal stenosis. Uncinate spurring and facet hypertrophic changes result in mild-to-moderate multilevel foraminal narrowing most pronounced at C4-5. Upper chest: Some biapical pleuroparenchymal scarring is noted. No acute abnormality in the lung apices. Other: Extensive cervical carotid atherosclerosis. Diminutive appearance of the thyroid. Paucity of subcutaneous fat. IMPRESSION: CT HEAD 1. Trace amount of right hyperdense parafalcine thickening which may be calcification though is more indeterminate in the setting of trauma and absence of direct visual comparison. Consider short-term (6 hour) interval follow-up to assess for stability or resolution. 2. No other acute intracranial abnormality. Background parenchymal volume loss and chronic microvascular angiopathy. 3. Extensive soft tissue swelling and thickening over the right temporoparietal region with soft tissue laceration and hematoma of the parietal scalp. No subjacent calvarial fracture. CT CERVICAL SPINE 1. No evidence of acute fracture or traumatic listhesis of the cervical spine. Osseous structures appear diffusely demineralized which may limit detection of small or nondisplaced fractures. 2. Multilevel degenerative disc disease and facet hypertrophic  changes of the cervical spine, detailed above. 3. Extensive cervical carotid atherosclerosis. Currently attempting to contact the ordering provider with a critical value result. Addendum will be submitted upon case discussion. Electronically Signed: By: Lovena Le M.D. On: 04/13/2020 02:33   CT Cervical Spine Wo Contrast  Addendum Date: 04/13/2020   ADDENDUM REPORT: 04/13/2020 02:42 ADDENDUM: These results  were called by telephone at the time of interpretation on 04/13/2020 at 2:42 am to provider DAVID [facility] , who verbally acknowledged these results. Electronically Signed   By: Lovena Le M.D.   On: 04/13/2020 02:42   Result Date: 04/13/2020 CLINICAL DATA:  Slipped and fell with positive head strike, laceration to the right temple EXAM: CT HEAD WITHOUT CONTRAST CT CERVICAL SPINE WITHOUT CONTRAST TECHNIQUE: Multidetector CT imaging of the head and cervical spine was performed following the standard protocol without intravenous contrast. Multiplanar CT image reconstructions of the cervical spine were also generated. COMPARISON:  Maxillofacial CT 05/03/2012, CT neck 05/04/2010 FINDINGS: CT HEAD FINDINGS Brain: There is a trace amount of right hyperdense parafalcine thickening which may be calcification though is more indeterminate in the setting of trauma and absence of direct visual comparison (3/26). No other sites suspicious for intracranial hemorrhage. No extra-axial collection. No mass effect or midline shift. No CT evidence of large vascular territory infarct. No convincing features of hydrocephaly. Symmetric prominence of the ventricles, cisterns and sulci compatible with parenchymal volume loss. Patchy areas of white matter hypoattenuation are most compatible with chronic microvascular angiopathy. Vascular: Atherosclerotic calcification of the carotid siphons and intradural vertebral arteries. No hyperdense vessel. Skull: Extensive soft tissue swelling and thickening over the right temporoparietal  region with soft tissue laceration and hematoma of the parietal scalp (3/25) measuring up to a maximal thickness of 9 mm. Overlying bandaging material is present. No subjacent calvarial fracture. No visible or suspected temporal bone fracture. Partial visualization of the reconstructive changes of the right mandible. Sinuses/Orbits: Periodontal disease including a periapical lucency of the right maxillary molars. 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 Remaining paranasal sinuses are predominantly clear. Small dependent bilateral mastoid effusions. Middle ear cavities are clear. Ossicular chains are normally configured. Debris in the right external auditory canal. Other: None CT CERVICAL SPINE FINDINGS Alignment: Cervical stabilization collar is in place at the time of examination. Preservation of the normal cervical lordosis. Likely degenerative anterolisthesis C4 on C5 with most pronounced facet changes at these levels. No evidence of traumatic listhesis. No abnormally widened, perched or jumped facets. Partial fusion of the posterior C2-3 vertebral bodies and the left articular facets. Normal alignment of the craniocervical and atlantoaxial articulations. Skull base and vertebrae: The osseous structures appear diffusely demineralized which may limit detection of small or nondisplaced fractures. Partial fusion of C2-3, as above. Advanced arthrosis of the C1-2 articulation anteriorly with some calcific pannus formation posterior to the dens. No visible skull base fracture. No acute cervical spine fracture or vertebral body height loss. No worrisome osseous lesions. Soft tissues and spinal canal: No pre or paravertebral fluid or swelling. No visible canal hematoma. Disc levels: Multilevel intervertebral disc height loss with spondylitic endplate changes. Small posterior disc osteophyte complexes at several levels without significant canal stenosis. Uncinate spurring and  facet hypertrophic changes result in mild-to-moderate multilevel foraminal narrowing most pronounced at C4-5. Upper chest: Some biapical pleuroparenchymal scarring is noted. No acute abnormality in the lung apices. Other: Extensive cervical carotid atherosclerosis. Diminutive appearance of the thyroid. Paucity of subcutaneous fat. IMPRESSION: CT HEAD 1. Trace amount of right hyperdense parafalcine thickening which may be calcification though is more indeterminate in the setting of trauma and absence of direct visual comparison. Consider short-term (6 hour) interval follow-up to assess for stability or resolution. 2. No other acute intracranial abnormality. Background parenchymal volume loss and chronic microvascular angiopathy. 3. Extensive soft tissue  swelling and thickening over the right temporoparietal region with soft tissue laceration and hematoma of the parietal scalp. No subjacent calvarial fracture. CT CERVICAL SPINE 1. No evidence of acute fracture or traumatic listhesis of the cervical spine. Osseous structures appear diffusely demineralized which may limit detection of small or nondisplaced fractures. 2. Multilevel degenerative disc disease and facet hypertrophic changes of the cervical spine, detailed above. 3. Extensive cervical carotid atherosclerosis. Currently attempting to contact the ordering provider with a critical value result. Addendum will be submitted upon case discussion. Electronically Signed: By: Lovena Le M.D. On: 04/13/2020 02:33    Assessment/Plan Principal Problem:   Hyperosmolar hyperglycemic state (HHS) ([place]) Active Problems:   Diabetes ([place])   Head injury   Diarrhea   AKI (acute kidney injury) ([place])   HHS/new onset diabetes: Patient reports 1 month history of polydipsia and polyuria.  He has been drinking sweet tea constantly throughout the day and at night.  Reports history of prediabetes. Blood glucose significantly elevated at 1400.  DKA felt to be less likely as  although anion gap is mildly elevated at 19, bicarb is normal at 22 and VBG with pH 7.44.  UA without ketones.   -Insulin infusion has been started.  Fluid bolus initiated in the ED, continue IV fluid hydration with normal saline at 200 cc/h.  Frequent CBG checks per Endo tool.  Keep n.p.o.  Serum osmolarity pending.  A1c pending. When CBG less than 180, initiate subcutaneous insulin and diet.  Continue IV insulin for an additional 2 hours.  Head injury secondary to mechanical fall: Patient sustained a head laceration which was stapled in the ED.  Neuro exam nonfocal.  Head CT showing trace amount of right hyperdense parafalcine thickening which radiologist feels is due to calcification though is indeterminate in the setting of trauma.  Recommending repeat head CT in 6 hours to assess for stability or resolution.   -Frequent neurochecks, repeat head CT in 6 hours has been ordered  Diarrhea: Complaining of mild lower abdominal cramps.  Abdominal exam benign.  No fever or leukocytosis.  No recent laxative or antibiotic use. -GI pathogen panel, enteric precautions  AKI on CKD stage III: Likely prerenal due to dehydration.  Creatinine 1.9, baseline 1.2. -Continue IV fluid hydration.  Monitor renal function and urine output.  Avoid nephrotoxic agents.  Mechanical fall: PT and OT evaluation, fall precautions  Macrocytosis without anemia: Check B12 and folate levels.  Elevated T bili: T bili 2.0, remainder of LFTs normal.   -Right upper quadrant ultrasound  Cough/mild hypoxia: Per patient, cough is chronic.  Denies history of tobacco use.  He is not on an ACE inhibitor.  Oxygen saturation in the low 90s on room air.  SARS-CoV-2 PCR test negative.  Lungs clear on exam. -Order chest x-ray  Carotid atherosclerosis: Extensive cervical carotid arthrosclerosis seen on CT. -Carotid Dopplers ordered  Hyperlipidemia: Resume TriCor after pharmacy med rec is done  Hypothyroidism: Resume Synthroid after  pharmacy med rec is done  HIV: Resume Triumeq after pharmacy med rec is done.  Depression: Resume Prozac after pharmacy med rec is done  DVT prophylaxis: SCDs at this time Code Status: Patient wishes to be DNR. Family Communication: No family available at this time. Disposition Plan: Status is: Inpatient  Remains inpatient appropriate because:IV treatments appropriate due to intensity of illness or inability to take PO and Inpatient level of care appropriate due to severity of illness   Dispo: The patient is from: Home  Anticipated d/c is to: SNF              Anticipated d/c date is: 3 days              Patient currently is not medically stable to d/c.  The medical decision making on this patient was of high complexity and the patient is at high risk for clinical deterioration, therefore this is a level 3 visit.  Shela Leff MD [facility]  If 7PM-7AM, please contact night-coverage [email or web address]  04/13/2020, 6:48 AM

## 2020-04-13 NOTE — ED Triage Notes (Signed)
Patient is from home, slipped and fell and hit head on nightstand.  CAOx4.  Bleeding is arterial in nature.  No LOC.  Laceration at right temple area.  No blood thinners.

## 2020-04-13 NOTE — ED Provider Notes (Signed)
[facility] EMERGENCY DEPARTMENT Provider Note   CSN: [id number] Arrival date & time: 04/13/20  0038   History Chief Complaint  Patient presents with  . Fall    Christopher [name] is a 69 y.o. male.  The history is provided by the patient.  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  He hit the right side of his head causing a laceration.  He denies loss of consciousness.  Is not unusual for him to trip and fall based on his pre-existing neuropathy.  He denies other injury in the fall.  Of note, he has had a lot of diarrhea today which was treated with a dose of loperamide and he has not had any diarrhea since 7 PM.  He is up-to-date on tetanus immunizations.  Past Medical History:  Diagnosis Date  . Anxiety   . Cataract   . Chronic kidney disease   . GERD (gastroesophageal reflux disease)   . Heart murmur   . HIV DISEASE 12/01/2006  . HYPERLIPIDEMIA, WITH LOW HDL 01/03/2008  . Hypertension   . HYPERTENSION 12/01/2006  . HYPOGONADISM 12/23/2009  . Neuromuscular disorder ([place])   . SYPHILIS, [place], LATENT NOS 01/18/2007  . Thyroid disease   . 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 Tuberculosis    history of TB about 30 years ago    Patient Active Problem List   Diagnosis Date Noted  . Depression 10/01/2013  . Peripheral neuropathy 09/30/2013  . Renal insufficiency 08/19/2013  . Hypothyroidism 11/01/2012  . 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 Hyperglycemia 12/08/2011  . HYPOGONADISM 12/23/2009  . 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 Dyslipidemia 01/03/2008  . SYPHILIS, Polio, LATENT NOS 01/18/2007  . Human immunodeficiency virus (HIV) disease ([place]) 12/01/2006  . Essential hypertension 12/01/2006  . PROTEINURIA 12/01/2006    Past Surgical History:  Procedure Laterality Date  .  COLONOSCOPY    . TONSILECTOMY, ADENOIDECTOMY, BILATERAL MYRINGOTOMY AND TUBES     tonsil cancer   . UPPER GASTROINTESTINAL ENDOSCOPY         Family History  Problem Relation Age of Onset  . Diabetes Mother   . COPD Mother   . Heart attack Father   . Colon cancer Neg Hx   . Colon polyps Neg Hx   . Esophageal cancer Neg Hx   . Rectal cancer Neg Hx   . Stomach cancer Neg Hx     Social History   Tobacco Use  . Smoking status: Former Research scientist (life sciences)  . Smokeless tobacco: Never Used  . Tobacco comment: quit when he was 69 years old.  Substance Use Topics  . Alcohol use: No    Alcohol/week: 2.0 standard drinks    Types: 2 Shots of liquor per week    Comment: very rare   . Drug use: No    Comment: 40 years ago used multiple drugs    Home Medications Prior to Admission medications   Medication Sig Start Date End Date Taking? Authorizing Provider  chlorhexidine (PERIDEX) 0.12 % solution Use as directed 15 mLs in the mouth or throat 2 (two) times daily.    [provider]  clonazePAM (KLONOPIN) 1 MG tablet TAKE 1 TABLET 3 TIMES A DAY AS NEEDED FOR ANXIETY    Michel Bickers, MD  fenofibrate (TRICOR) 145 MG tablet TAKE ONE TABLET BY  MOUTH DAILY 02/07/20   Michel Bickers, MD  FLUoxetine (PROZAC) 20 MG capsule TAKE ONE CAPSULE BY MOUTH DAILY 04/08/20   Michel Bickers, MD  levothyroxine (SYNTHROID, LEVOTHROID) 50 MCG tablet Take 1 tablet (50 mcg total) by mouth daily before breakfast. 06/04/14   Heath Lark, MD  metoCLOPramide (REGLAN) 5 MG tablet TAKE ONE TABLET BY MOUTH THREE TIMES A DAY BEFORE MEALS 09/23/19   Doran Stabler, MD  pantoprazole (PROTONIX) 40 MG tablet TAKE ONE TABLET BY MOUTH DAILY 03/09/20   Doran Stabler, MD  temazepam (RESTORIL) 30 MG capsule Take 30 mg by mouth at bedtime. 07/26/16   [provider]  TRIUMEQ 600-50-300 MG tablet TAKE ONE TABLET BY MOUTH DAILY 12/23/19   Michel Bickers, MD  Dolutegravir Sodium (TIVICAY) 50 MG TABS Take 1 tablet (50 mg  total) by mouth daily. 12/05/12 12/05/28  Truman Hayward, MD    Allergies    Codeine, Sulfamethoxazole-trimethoprim, and Sulfonamide derivatives  Review of Systems   Review of Systems  All other systems reviewed and are negative.   Physical Exam Updated Vital Signs BP 136/89 (BP Location: Left Arm)   Pulse (!) 106   Temp 98.1 F (36.7 C) (Oral)   Resp 17   SpO2 90%   Physical Exam Vitals and nursing note reviewed.   69 year old male, resting comfortably and in no acute distress. Vital signs are significant for mild elevation of heart rate and respiratory rate. Oxygen saturation is 90%, which is normal. Head is normocephalic.  Laceration present right parietal area. PERRLA, EOMI. Oropharynx is clear.  Deformity of jaw from prior surgery noted. Neck is immobilized in a stiff cervical collar and is nontender without adenopathy or JVD. Back is nontender and there is no CVA tenderness. Lungs are clear without rales, wheezes, or rhonchi. Chest is nontender. Heart has regular rate and rhythm without murmur. Abdomen is soft, flat, nontender without masses or hepatosplenomegaly and peristalsis is hypoactive. Extremities have no cyanosis or edema, full range of motion is present. Skin is warm and dry without rash. Neurologic: Mental status is normal, cranial nerves are intact, there are no motor deficits.  ED Results / Procedures / Treatments   Labs (all labs ordered are listed, but only abnormal results are displayed) Labs Reviewed  BASIC METABOLIC PANEL - Abnormal; Notable for the following components:      Result Value   Sodium 119 (*)    Chloride 78 (*)    Glucose, Bld 1,400 (*)    Creatinine, Ser 1.93 (*)    GFR calc non Af Amer 34 (*)    GFR calc Af Amer 40 (*)    Anion gap 19 (*)    All other components within normal limits  CBC WITH DIFFERENTIAL/PLATELET - Abnormal; Notable for the following components:   RBC 3.92 (*)    MCV 110.7 (*)    Neutro Abs 8.3 (*)     Lymphs Abs 0.5 (*)    All other components within normal limits  BLOOD GAS, VENOUS  HEPATIC FUNCTION PANEL  MAGNESIUM  PHOSPHORUS  URINALYSIS, ROUTINE W REFLEX MICROSCOPIC    Radiology CT Head Wo Contrast  Addendum Date: 04/13/2020   ADDENDUM REPORT: 04/13/2020 02:42 ADDENDUM: These results were called by telephone at the time of interpretation on 04/13/2020 at 2:42 am to provider Kimmi Acocella [facility] , who verbally acknowledged these results. Electronically Signed   By: Lovena Le M.D.   On: 04/13/2020 02:42   Result Date:  04/13/2020 CLINICAL DATA:  Slipped and fell with positive head strike, laceration to the right temple EXAM: CT HEAD WITHOUT CONTRAST CT CERVICAL SPINE WITHOUT CONTRAST TECHNIQUE: Multidetector CT imaging of the head and cervical spine was performed following the standard protocol without intravenous contrast. Multiplanar CT image reconstructions of the cervical spine were also generated. COMPARISON:  Maxillofacial CT 05/03/2012, CT neck 05/04/2010 FINDINGS: CT HEAD FINDINGS Brain: There is a trace amount of right hyperdense parafalcine thickening which may be calcification though is more indeterminate in the setting of trauma and absence of direct visual comparison (3/26). No other sites suspicious for intracranial hemorrhage. No extra-axial collection. No mass effect or midline shift. No CT evidence of large vascular territory infarct. No convincing features of hydrocephaly. Symmetric prominence of the ventricles, cisterns and sulci compatible with parenchymal volume loss. Patchy areas of white matter hypoattenuation are most compatible with chronic microvascular angiopathy. Vascular: Atherosclerotic calcification of the carotid siphons and intradural vertebral arteries. No hyperdense vessel. Skull: Extensive soft tissue swelling and thickening over the right temporoparietal region with soft tissue laceration and hematoma of the parietal scalp (3/25) measuring up to a maximal thickness of  9 mm. Overlying bandaging material is present. No subjacent calvarial fracture. No visible or suspected temporal bone fracture. Partial visualization of the reconstructive changes of the right mandible. Sinuses/Orbits: Periodontal disease including a periapical lucency of the right maxillary molars. 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 Remaining paranasal sinuses are predominantly clear. Small dependent bilateral mastoid effusions. Middle ear cavities are clear. Ossicular chains are normally configured. Debris in the right external auditory canal. Other: None CT CERVICAL SPINE FINDINGS Alignment: Cervical stabilization collar is in place at the time of examination. Preservation of the normal cervical lordosis. Likely degenerative anterolisthesis C4 on C5 with most pronounced facet changes at these levels. No evidence of traumatic listhesis. No abnormally widened, perched or jumped facets. Partial fusion of the posterior C2-3 vertebral bodies and the left articular facets. Normal alignment of the craniocervical and atlantoaxial articulations. Skull base and vertebrae: The osseous structures appear diffusely demineralized which may limit detection of small or nondisplaced fractures. Partial fusion of C2-3, as above. Advanced arthrosis of the C1-2 articulation anteriorly with some calcific pannus formation posterior to the dens. No visible skull base fracture. No acute cervical spine fracture or vertebral body height loss. No worrisome osseous lesions. Soft tissues and spinal canal: No pre or paravertebral fluid or swelling. No visible canal hematoma. Disc levels: Multilevel intervertebral disc height loss with spondylitic endplate changes. Small posterior disc osteophyte complexes at several levels without significant canal stenosis. Uncinate spurring and facet hypertrophic changes result in mild-to-moderate multilevel foraminal narrowing most pronounced at C4-5. Upper  chest: Some biapical pleuroparenchymal scarring is noted. No acute abnormality in the lung apices. Other: Extensive cervical carotid atherosclerosis. Diminutive appearance of the thyroid. Paucity of subcutaneous fat. IMPRESSION: CT HEAD 1. Trace amount of right hyperdense parafalcine thickening which may be calcification though is more indeterminate in the setting of trauma and absence of direct visual comparison. Consider short-term (6 hour) interval follow-up to assess for stability or resolution. 2. No other acute intracranial abnormality. Background parenchymal volume loss and chronic microvascular angiopathy. 3. Extensive soft tissue swelling and thickening over the right temporoparietal region with soft tissue laceration and hematoma of the parietal scalp. No subjacent calvarial fracture. CT CERVICAL SPINE 1. No evidence of acute fracture or traumatic listhesis of the cervical spine. Osseous structures appear diffusely  demineralized which may limit detection of small or nondisplaced fractures. 2. Multilevel degenerative disc disease and facet hypertrophic changes of the cervical spine, detailed above. 3. Extensive cervical carotid atherosclerosis. Currently attempting to contact the ordering provider with a critical value result. Addendum will be submitted upon case discussion. Electronically Signed: By: Lovena Le M.D. On: 04/13/2020 02:33   CT Cervical Spine Wo Contrast  Addendum Date: 04/13/2020   ADDENDUM REPORT: 04/13/2020 02:42 ADDENDUM: These results were called by telephone at the time of interpretation on 04/13/2020 at 2:42 am to provider Jashiya Bassett [facility] , who verbally acknowledged these results. Electronically Signed   By: Lovena Le M.D.   On: 04/13/2020 02:42   Result Date: 04/13/2020 CLINICAL DATA:  Slipped and fell with positive head strike, laceration to the right temple EXAM: CT HEAD WITHOUT CONTRAST CT CERVICAL SPINE WITHOUT CONTRAST TECHNIQUE: Multidetector CT imaging of the head and  cervical spine was performed following the standard protocol without intravenous contrast. Multiplanar CT image reconstructions of the cervical spine were also generated. COMPARISON:  Maxillofacial CT 05/03/2012, CT neck 05/04/2010 FINDINGS: CT HEAD FINDINGS Brain: There is a trace amount of right hyperdense parafalcine thickening which may be calcification though is more indeterminate in the setting of trauma and absence of direct visual comparison (3/26). No other sites suspicious for intracranial hemorrhage. No extra-axial collection. No mass effect or midline shift. No CT evidence of large vascular territory infarct. No convincing features of hydrocephaly. Symmetric prominence of the ventricles, cisterns and sulci compatible with parenchymal volume loss. Patchy areas of white matter hypoattenuation are most compatible with chronic microvascular angiopathy. Vascular: Atherosclerotic calcification of the carotid siphons and intradural vertebral arteries. No hyperdense vessel. Skull: Extensive soft tissue swelling and thickening over the right temporoparietal region with soft tissue laceration and hematoma of the parietal scalp (3/25) measuring up to a maximal thickness of 9 mm. Overlying bandaging material is present. No subjacent calvarial fracture. No visible or suspected temporal bone fracture. Partial visualization of the reconstructive changes of the right mandible. Sinuses/Orbits: Periodontal disease including a periapical lucency of the right maxillary molars. 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 Remaining paranasal sinuses are predominantly clear. Small dependent bilateral mastoid effusions. Middle ear cavities are clear. Ossicular chains are normally configured. Debris in the right external auditory canal. Other: None CT CERVICAL SPINE FINDINGS Alignment: Cervical stabilization collar is in place at the time of examination. Preservation of the normal  cervical lordosis. Likely degenerative anterolisthesis C4 on C5 with most pronounced facet changes at these levels. No evidence of traumatic listhesis. No abnormally widened, perched or jumped facets. Partial fusion of the posterior C2-3 vertebral bodies and the left articular facets. Normal alignment of the craniocervical and atlantoaxial articulations. Skull base and vertebrae: The osseous structures appear diffusely demineralized which may limit detection of small or nondisplaced fractures. Partial fusion of C2-3, as above. Advanced arthrosis of the C1-2 articulation anteriorly with some calcific pannus formation posterior to the dens. No visible skull base fracture. No acute cervical spine fracture or vertebral body height loss. No worrisome osseous lesions. Soft tissues and spinal canal: No pre or paravertebral fluid or swelling. No visible canal hematoma. Disc levels: Multilevel intervertebral disc height loss with spondylitic endplate changes. Small posterior disc osteophyte complexes at several levels without significant canal stenosis. Uncinate spurring and facet hypertrophic changes result in mild-to-moderate multilevel foraminal narrowing most pronounced at C4-5. Upper chest: Some biapical pleuroparenchymal scarring is noted. No acute  abnormality in the lung apices. Other: Extensive cervical carotid atherosclerosis. Diminutive appearance of the thyroid. Paucity of subcutaneous fat. IMPRESSION: CT HEAD 1. Trace amount of right hyperdense parafalcine thickening which may be calcification though is more indeterminate in the setting of trauma and absence of direct visual comparison. Consider short-term (6 hour) interval follow-up to assess for stability or resolution. 2. No other acute intracranial abnormality. Background parenchymal volume loss and chronic microvascular angiopathy. 3. Extensive soft tissue swelling and thickening over the right temporoparietal region with soft tissue laceration and hematoma  of the parietal scalp. No subjacent calvarial fracture. CT CERVICAL SPINE 1. No evidence of acute fracture or traumatic listhesis of the cervical spine. Osseous structures appear diffusely demineralized which may limit detection of small or nondisplaced fractures. 2. Multilevel degenerative disc disease and facet hypertrophic changes of the cervical spine, detailed above. 3. Extensive cervical carotid atherosclerosis. Currently attempting to contact the ordering provider with a critical value result. Addendum will be submitted upon case discussion. Electronically Signed: By: Lovena Le M.D. On: 04/13/2020 02:33    Procedures .Marland KitchenLaceration Repair  Date/Time: 04/13/2020 3:18 AM Performed by: Delora Fuel, MD Authorized by: Delora Fuel, MD   Consent:    Consent obtained:  Verbal   Consent given by:  Patient   Risks discussed:  Infection and pain   Alternatives discussed:  No treatment Anesthesia (see MAR for exact dosages):    Anesthesia method:  None Laceration details:    Location:  Scalp   Scalp location:  R parietal   Length (cm):  2   Depth (mm):  3 Repair type:    Repair type:  Simple Pre-procedure details:    Preparation:  Patient was prepped and draped in usual sterile fashion and imaging obtained to evaluate for foreign bodies Exploration:    Hemostasis achieved with:  Direct pressure   Wound exploration: entire depth of wound probed and visualized     Wound extent: no foreign bodies/material noted     Contaminated: no   Treatment:    Area cleansed with:  Saline   Amount of cleaning:  Standard Skin repair:    Repair method:  Staples   Number of staples:  5 Approximation:    Approximation:  Close Post-procedure details:    Dressing:  Sterile dressing   Patient tolerance of procedure:  Tolerated well, no immediate complications Comments:     Wound was cleaned, blood was noted to be spurting from the laceration.  Following closure, there was persistent oozing, so  pressure dressing is applied.    CRITICAL CARE Performed by: Delora Fuel Total critical care time: 95 minutes Critical care time was exclusive of separately billable procedures and treating other patients. Critical care was necessary to treat or prevent imminent or life-threatening deterioration. Critical care was time spent personally by me on the following activities: development of treatment plan with patient and/or surrogate as well as nursing, discussions with consultants, evaluation of patient's response to treatment, examination of patient, obtaining history from patient or surrogate, ordering and performing treatments and interventions, ordering and review of laboratory studies, ordering and review of radiographic studies, pulse oximetry and re-evaluation of patient's condition.  Medications Ordered in ED Medications  insulin aspart (novoLOG) injection 10 Units (has no administration in time range)  sodium chloride 0.9 % bolus 1,000 mL (1,000 mLs Intravenous New Bag/Given 04/13/20 0211)    ED Course  I have reviewed the triage vital signs and the nursing notes.  Pertinent labs & imaging results  that were available during my care of the patient were reviewed by me and considered in my medical decision making (see chart for details).  MDM Rules/Calculators/A&P Fall with scalp laceration.  He will be sent for CT of head and cervical spine.  Diarrhea.  He does not appear clinically dehydrated but will check electrolytes and give IV fluids.  Old records are reviewed, and he has no relevant past visits.  CT shows questionable parafalcine hemorrhage versus calcifications.  Labs are significant for glucose of 1400.  Sodium is appropriately low at 119, but anion gap is elevated to 20 with normal CO2.  Creatinine is also elevated to 1.93 which is an increase compared with 1.20 on 09/20/2019.  With normal CO2, I doubt ketoacidosis, elevated anion gap could be secondary to dehydration related to  his diarrhea.  On further questioning, patient has had polyuria and polydipsia for about the last 2 weeks.  Further review of past records shows glucose of 289 on 09/20/2019.  Macrocytosis is noted of uncertain significance, hemoglobin normal.  He is started on intravenous insulin.  Laceration is closed with staples.  There was some persistent oozing through the laceration, so with pressure dressings applied.  Radiologist had recommended repeat CT scan to make sure that there is no expanding hematoma.  Case is discussed with Dr. Marlowe Sax of [facility] hospitalist who agrees to admit the patient, requests he be started on an insulin infusion.  Of note, venous blood gas showed normal pH, no evidence of ketoacidosis.  Final Clinical Impression(s) / ED Diagnoses Final diagnoses:  Fall from slip, trip, or stumble, initial encounter  Scalp laceration, initial encounter  Hyperglycemia  Acute kidney injury (nontraumatic) ([place])  Macrocytosis without anemia  Closed head injury, initial encounter    Rx / DC Orders ED Discharge Orders    None       Delora Fuel, MD [id number] [phone]

## 2020-04-14 ENCOUNTER — Other Ambulatory Visit: Payer: Self-pay

## 2020-04-14 ENCOUNTER — Inpatient Hospital Stay (HOSPITAL_COMMUNITY): Payer: No Typology Code available for payment source

## 2020-04-14 LAB — GLUCOSE, CAPILLARY
Glucose-Capillary: 196 mg/dL — ABNORMAL HIGH (ref 70–99)
Glucose-Capillary: 212 mg/dL — ABNORMAL HIGH (ref 70–99)
Glucose-Capillary: 253 mg/dL — ABNORMAL HIGH (ref 70–99)
Glucose-Capillary: 256 mg/dL — ABNORMAL HIGH (ref 70–99)
Glucose-Capillary: 268 mg/dL — ABNORMAL HIGH (ref 70–99)
Glucose-Capillary: 279 mg/dL — ABNORMAL HIGH (ref 70–99)
Glucose-Capillary: 327 mg/dL — ABNORMAL HIGH (ref 70–99)

## 2020-04-14 LAB — MRSA PCR SCREENING: MRSA by PCR: NEGATIVE

## 2020-04-14 LAB — BASIC METABOLIC PANEL
Anion gap: 7 (ref 5–15)
BUN: 12 mg/dL (ref 8–23)
CO2: 27 mmol/L (ref 22–32)
Calcium: 8.8 mg/dL — ABNORMAL LOW (ref 8.9–10.3)
Chloride: 101 mmol/L (ref 98–111)
Creatinine, Ser: 1.11 mg/dL (ref 0.61–1.24)
GFR calc Af Amer: >60 mL/min (ref >60)
GFR calc non Af Amer: >60 mL/min (ref >60)
Glucose, Bld: 290 mg/dL — ABNORMAL HIGH (ref 70–99)
Potassium: 3.7 mmol/L (ref 3.5–5.1)
Sodium: 135 mmol/L (ref 135–145)

## 2020-04-14 MED ORDER — LIVING WELL WITH DIABETES BOOK
Freq: Once | Status: AC
  Filled 2020-04-14: qty 1

## 2020-04-14 MED ORDER — CLOPIDOGREL BISULFATE 75 MG PO TABS
75.0000 mg | ORAL_TABLET | Freq: Every day | ORAL | Status: DC
  Administered 2020-04-14 – 2020-04-16 (×3): 75 mg via ORAL
  Filled 2020-04-14 (×3): qty 1

## 2020-04-14 MED ORDER — INSULIN STARTER KIT- PEN NEEDLES (ENGLISH)
1.0000 | Freq: Once | Status: AC
  Administered 2020-04-14: 1
  Filled 2020-04-14: qty 1

## 2020-04-14 MED ORDER — IOHEXOL 350 MG/ML SOLN
100.0000 mL | Freq: Once | INTRAVENOUS | Status: AC | PRN
  Administered 2020-04-14: 100 mL via INTRAVENOUS

## 2020-04-14 MED ORDER — INSULIN ASPART 100 UNIT/ML ~~LOC~~ SOLN
3.0000 [IU] | Freq: Three times a day (TID) | SUBCUTANEOUS | Status: DC
  Administered 2020-04-14 – 2020-04-15 (×3): 3 [IU] via SUBCUTANEOUS

## 2020-04-14 MED ORDER — GLUCERNA SHAKE PO LIQD
237.0000 mL | Freq: Three times a day (TID) | ORAL | Status: DC
  Administered 2020-04-14 – 2020-04-15 (×5): 237 mL via ORAL

## 2020-04-14 MED ORDER — INSULIN GLARGINE 100 UNIT/ML ~~LOC~~ SOLN
15.0000 [IU] | Freq: Every day | SUBCUTANEOUS | Status: DC
  Administered 2020-04-14 – 2020-04-15 (×2): 15 [IU] via SUBCUTANEOUS
  Filled 2020-04-14 (×2): qty 0.15

## 2020-04-14 NOTE — TOC Initial Note (Signed)
Transition of Care [facility]) - Initial/Assessment Note    Patient Details  Name: Christopher [name] MRN: [id number] Date of Birth: [birth year]  Transition of Care [facility]) CM/SW Contact:    Maryclare Labrador, RN Phone Number: 04/14/2020, 3:50 PM  Clinical Narrative:    PTA independent from home with husband.  Pt confirms he has a PCP and denied barriers with paying for discharge meds.  Pt plans to transport home by private vehicle driven by his husband.  Pt also confirms he will have 24 hour supervision at discharge. Pt politely declined [place] as recommended.               Expected Discharge Plan: Home/Self Care Barriers to Discharge: Continued Medical Work up   Patient Goals and CMS Choice        Expected Discharge Plan and Services Expected Discharge Plan: Home/Self Care       Living arrangements for the past 2 months: Single Family Home                           HH Arranged: Refused HH          Prior Living Arrangements/Services Living arrangements for the past 2 months: Single Family Home Lives with:: Spouse Patient language and need for interpreter reviewed:: Yes Do you feel safe going back to the place where you live?: Yes      Need for Family Participation in Patient Care: Yes (Comment) Care giver support system in place?: Yes (comment)   Criminal Activity/Legal Involvement Pertinent to Current Situation/Hospitalization: No - Comment as needed  Activities of Daily Living Home Assistive Devices/Equipment: None ADL Screening (condition at time of admission) Patient's cognitive ability adequate to safely complete daily activities?: Yes Is the patient deaf or have difficulty hearing?: No Does the patient have difficulty seeing, even when wearing glasses/contacts?: No Does the patient have difficulty concentrating, remembering, or making decisions?: No Patient able to express need for assistance with ADLs?: No Does the patient have difficulty dressing or bathing?:  No Independently performs ADLs?: Yes (appropriate for developmental age) Does the patient have difficulty walking or climbing stairs?: No Weakness of Legs: Both Weakness of Arms/Hands: Both  Permission Sought/Granted                  Emotional Assessment   Attitude/Demeanor/Rapport: Self-Confident, Engaged Affect (typically observed): Accepting, Adaptable Orientation: : Oriented to Self, Oriented to Place, Oriented to  Time, Oriented to Situation   Psych Involvement: No (comment)  Admission diagnosis:  Cough [R05] Hyperglycemia [R73.9] Serum total bilirubin elevated [R17] Acute kidney injury (nontraumatic) (HCC) [N17.9] Macrocytosis without anemia [D75.89] Closed head injury, initial encounter [S09.90XA] Fall from slip, trip, or stumble, initial encounter [W01.0XXA] Scalp laceration, initial encounter [S01.01XA] Hyperosmolar hyperglycemic state (HHS) ([place]) [E11.00, E11.65] Patient Active Problem List   Diagnosis Date Noted  . Hyperosmolar hyperglycemic state (HHS) ([place]) 04/13/2020  . Diabetes ([place]) 04/13/2020  . Head injury 04/13/2020  . Diarrhea 04/13/2020  . AKI (acute kidney injury) ([place]) 04/13/2020  . Depression 10/01/2013  . Peripheral neuropathy 09/30/2013  . Renal insufficiency 08/19/2013  . Hypothyroidism 11/01/2012  . 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 Hyperglycemia 12/08/2011  . HYPOGONADISM 12/23/2009  . 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 Dyslipidemia 01/03/2008  . SYPHILIS, [place], LATENT NOS 01/18/2007  . Human immunodeficiency virus (HIV) disease ([place]) 12/01/2006  . Essential hypertension 12/01/2006  . PROTEINURIA 12/01/2006   PCP:  [name], MD Pharmacy:  [facility] [address], [place] [facility] [address] [place] [address] Phone: [phone] Fax: [phone]     Social Determinants of Health ([place]) Interventions    Readmission Risk Interventions No flowsheet data found.

## 2020-04-14 NOTE — Progress Notes (Addendum)
Progress Note    04/14/2020 7:54 AM * No surgery found *  Subjective:  Cpmplaining of dry mouth (chronic).  He denies vision disturbances or extremity weakness overnight   Vitals:   04/14/20 0000 04/14/20 0400  BP: (!) 144/87 108/63  Pulse: 80 63  Resp: 13 17  Temp: (!) 97.1 F (36.2 C) 97.7 F (36.5 C)  SpO2: 99% 92%    Physical Exam: Cardiac:  RRR Lungs:  CTA Bil Extremities:  5/5 grip strength bilaterally. AROM all extremities  CBC    Component Value Date/Time   WBC 9.5 04/13/2020 0100   RBC 3.92 (L) 04/13/2020 0100   HGB 13.6 04/13/2020 0334   HGB 14.4 07/02/2012 0856   HCT 40.0 04/13/2020 0334   HCT 42.8 07/02/2012 0856   PLT 209 04/13/2020 0100   PLT 179 07/02/2012 0856   MCV 110.7 (H) 04/13/2020 0100   MCV 93.5 07/02/2012 0856   MCH 33.4 04/13/2020 0100   MCHC 30.2 04/13/2020 0100   RDW 14.2 04/13/2020 0100   RDW 15.0 (H) 07/02/2012 0856   LYMPHSABS 0.5 (L) 04/13/2020 0100   LYMPHSABS 1.2 07/02/2012 0856   MONOABS 0.6 04/13/2020 0100   MONOABS 0.3 07/02/2012 0856   EOSABS 0.0 04/13/2020 0100   EOSABS 0.1 07/02/2012 0856   BASOSABS 0.0 04/13/2020 0100   BASOSABS 0.0 07/02/2012 0856    BMET    Component Value Date/Time   NA 135 04/14/2020 0325   NA 140 10/31/2011 1039   K 3.7 04/14/2020 0325   K 4.4 10/31/2011 1039   CL 101 04/14/2020 0325   CL 100 10/31/2011 1039   CO2 27 04/14/2020 0325   CO2 30 10/31/2011 1039   GLUCOSE 290 (H) 04/14/2020 0325   GLUCOSE 97 10/31/2011 1039   BUN 12 04/14/2020 0325   BUN 18 10/31/2011 1039   CREATININE 1.11 04/14/2020 0325   CREATININE 1.20 09/20/2019 1116   CALCIUM 8.8 (L) 04/14/2020 0325   CALCIUM 9.4 10/31/2011 1039   GFRNONAA >60 04/14/2020 0325   GFRNONAA 43 (L) 09/04/2018 1154   GFRAA >60 04/14/2020 0325   GFRAA 50 (L) 09/04/2018 1154     Intake/Output Summary (Last 24 hours) at 04/14/2020 0754 Last data filed at 04/14/2020 2130 Gross per 24 hour  Intake 2834.46 ml  Output 800 ml  Net  2034.46 ml    HOSPITAL MEDICATIONS Scheduled Meds: . abacavir-dolutegravir-lamiVUDine  1 tablet Oral Daily  . aspirin EC  81 mg Oral Daily  . FLUoxetine  20 mg Oral Daily  . folic acid  1 mg Oral Daily  . insulin aspart  0-5 Units Subcutaneous QHS  . insulin aspart  0-9 Units Subcutaneous TID WC  . insulin aspart  3 Units Subcutaneous TID WC  . insulin glargine  15 Units Subcutaneous Daily  . insulin starter kit- pen needles  1 kit Other Once  . levothyroxine  50 mcg Oral QAC breakfast  . living well with diabetes book   Does not apply Once  . rosuvastatin  10 mg Oral Daily  . temazepam  30 mg Oral QHS  . vitamin B-12  100 mcg Oral Daily   Continuous Infusions: . sodium chloride 100 mL/hr at 04/13/20 1932  . sodium chloride 50 mL/hr at 04/13/20 2103   PRN Meds:.acetaminophen **OR** acetaminophen, albuterol, clonazePAM, dextrose  Assessment:right ICA stenosis; hx head/neck cancer with XRT    Plan: -Awaiting CTA head and neck. On ASA and statin -   Risa Grill, PA-C [facility]  [phone] 04/14/2020  7:54 AM   CTA reviewed bilateral > 80% ICA stenosis.  Would say this is asymptomatic.  Will give pt a few weeks to recover from this scalp laceration.then plan bilateral staged TCAR stent.    He needs to be on Plavix ASA and statin.  Ruta Hinds, MD [facility] [place] Office: [phone]

## 2020-04-14 NOTE — Progress Notes (Signed)
Inpatient Diabetes Program Recommendations  AACE/ADA: New Consensus Statement on Inpatient Glycemic Control (2015)  Target Ranges:  Prepandial:   less than 140 mg/dL      Peak postprandial:   less than 180 mg/dL (1-2 hours)      Critically ill patients:  140 - 180 mg/dL   Results for Christopher [name], Christopher [name] (MRN [id number]) as of 04/14/2020 12:50  Ref. Range 04/14/2020 00:09 04/14/2020 07:40 04/14/2020 11:06 04/14/2020 12:39  Glucose-Capillary Latest Ref Range: 70 - 99 mg/dL 212 (H)  2 units NOVOLOG  327 (H)  7 units NOVOLOG  279 (H)  8 units NOVOLOG +  15 units LANTUS  256 (H)   Results for Christopher, Favia LEONARD [name] (MRN [id number]) as of 04/14/2020 12:50  Ref. Range 04/13/2020 04:21  Hemoglobin A1C Latest Ref Range: 4.8 - 5.6 % 12.9 (H)    Admit with: Fall/ Head Injury after Mechanical Fall/ HHS/ New Onset Diabetes  History: CKD3, HIV, HTN, Pre-Diabetes per his PCP (2 years ago)  Home DM Meds: None  Current Orders: Lantus 15 units Daily      Novolog Sensitive Correction Scale/ SSI (0-9 units) TID AC + HS      Novolog 3 units TID with meals  PCP: Dr. [name]    MD- Note Lantus increased this AM and Novolog Meal Coverage started at 12pm with lunch today.  Based on A1c of 12.9%, patient likely needs Insulin for home.  Pt and his spouse would prefer Insulin pens for home.  When patient ready to d/c home, please make sure to give patient the following Rxs: 1. Lantus Insulin Pen- Order # [id number] 2. Novolog Insulin Pen- Order # [id number] 3. Insulin Pen Needles- Order # [id number] 8. CBG Meter and Supplies- Order # [id number]     Met w/ pt and his spouse again today at bedside.  RD had just visited with the patient and reviewed proper diabetes diet at home. Asked pt and his spouse if they had any questions about what the RD just reviewed with the pt and they stated they felt comfortable with the info.  Re-emphasized avoidance of beverages with sugar and portion control of carbohydrates.  Per  pt's spouse, spouse has diabetes as well and has been thru diabetes classes and is happy to help the patient (Christopher [name]) with all his diabetes care at home.  Pt's spouse stated he plans to get Christopher [name] established with his Endocrinologist Dr. Elyse Hsu with [facility].  Re-reviewed basic diabetes info with pt and spouse.  Also reviewed signs and symptoms of Hypoglycemia and how to treat at home.  Also reviewed Lantus and Novolog with pt and spouse (what they are, how they work, how to take, etc).  Educated patient and spouse on insulin pen use at home.  Reviewed contents of insulin flexpen starter kit.  Reviewed all steps of insulin pen including attachment of needle, 2-unit air shot, dialing up dose, giving injection, rotation of injection sites, removing needle, disposal of sharps, storage of unused insulin, disposal of insulin etc.  Patient's spouse able to provide successful return demonstration and plans to assist pt with all injections at home.  Reviewed troubleshooting with insulin pen.  Have asked RNs caring for patient to please allow patient to give all injections here in hospital as much as possible for practice.  MD to give patient Rxs for insulin pens and insulin pen needles.      --Will follow patient during hospitalization--  Wyn Quaker RN, MSN, CDE Diabetes  Coordinator Inpatient Glycemic Control Team Team Pager: [phone] (8a-5p)

## 2020-04-14 NOTE — Progress Notes (Signed)
PROGRESS NOTE    Christopher [name]  [id number] DOB: 03/24/1951 DOA: 04/13/2020 PCP: [name], MD   Brief Narrative: 69 year old with past medical history significant for anxiety, CKD stage III, GERD, HIV, hypertension, hyperlipidemia hypothyroidism, history of tonsillar cancer, history of TB presents for evaluation of head injury after mechanical fall.  To be tachycardic, tachypneic, CBG 1400, anion gap at 14, bicarb 22 UA without ketones.  CT head showed trace amount of right hyperdense parafalcine thickening, recommendation was to repeat CT scan in 6 hours but this might be calcification.    Assessment & Plan:   Principal Problem:   Hyperosmolar hyperglycemic state (HHS) ([place]) Active Problems:   Diabetes ([place])   Head injury   Diarrhea   AKI (acute kidney injury) ([place])  1-Hyperosmolar hyperglycemic state/new onset diabetes: -Patient presented with a blood sugar 1400, gap of 19, bicarb 22 - for ketones in urine. -Patient was started on insulin drip. Now transition to lantus, increase dose to 15 units. 3 units novolog added for meals coverage.  -HBA1c at 12.   2-Head injury secondary to mechanical fall Patient sustained head laceration which was stapled in the ED. CT head showed trace amount of right hyperdense parafalcine thickening which could be related to calcification, recommendation was to repeat CT head in 6 hours Repeated CT head stable  3-Hyponatremia;  Pseudohyponatremia.  Related to HHS.  Improved.   4-Diarrhea: GI pathogen and enteric precaution  AKI on CKD stage III: Baseline 1.2 Related to hypovolemia related to hyperglycemia. Continue with IV fluids Resolved.   Mechanical fall: PT OT eval.   Macrocytosis without anemia: Check [id number] folic acid. Folic acid low, B 12 low normal. Start supplement.   Elevated bilirubin: Right upper quadrant ultrasound.  Cholelithiasis without finding of cholecystitis Hypekalemia: Related to honk.  Carotid  atherosclerosis: Carotid Doppler. Preliminary results 80-90 % stenosis right ICA, vascular consulted.  CT angio neck Bilateral carotid stenosis more than 80 %. Vascular planning B/L stent placement. Started on Plavix, continue with aspirin and statins.   Mild acute hypoxic respiratory failure: Chronic cough.  Covid test negative.  Chest x-ray: Mild bronchitis changes.  Albuterol as needed.  HIV: Resume Triumeq  Hypothyroidism; on synthroid.   Estimated body mass index is 23.39 kg/m as calculated from the following:   Height as of 01/31/19: 5\' 10"  (1.778 m).   Weight as of 10/07/19: 73.9 kg.   DVT prophylaxis: SCDs Code Status: DNR  family Communication: Care discussed with patient and husband at bedside.  Disposition Plan:  Status is: Inpatient  Remains inpatient appropriate because:IV treatments appropriate due to intensity of illness or inability to take PO   Dispo: The patient is from: Home              Anticipated d/c is to: Home              Anticipated d/c date is: 1 day if CBG improved.               Patient currently is not medically stable to d/c.         Consultants:   None  Procedures:   Ultrasound: Cholelithiasis no cholecystitis  Antimicrobials:    Subjective: No new complaints.   Objective: Vitals:   04/14/20 0000 04/14/20 0400 04/14/20 0700 04/14/20 1235  BP: (!) 144/87 108/63 97/73 106/63  Pulse: 80 63 69 77  Resp: 13 17 16 18   Temp: (!) 97.1 F (36.2 C) 97.7 F (36.5 C) 97.9 F (36.6 C)  98.2 F (36.8 C)  TempSrc: Oral Axillary Oral Oral  SpO2: 99% 92%      Intake/Output Summary (Last 24 hours) at 04/14/2020 1350 Last data filed at 04/14/2020 1238 Gross per 24 hour  Intake 3074.46 ml  Output 800 ml  Net 2274.46 ml   There were no vitals filed for this visit.  Examination:  General exam: NAD Respiratory system: CTA Cardiovascular system: S 1, S 2 RRR. Gastrointestinal system: BS present, soft, nt Central nervous system: alert  and oriented.  Hematoma in the temporal area status of sutures Extremities: no edema    Data Reviewed: I have personally reviewed following labs and imaging studies  CBC: Recent Labs  Lab 04/13/20 0100 04/13/20 0334  WBC 9.5  --   NEUTROABS 8.3*  --   HGB 13.1 13.6  HCT 43.4 40.0  MCV 110.7*  --   PLT 209  --    Basic Metabolic Panel: Recent Labs  Lab 04/13/20 0100 04/13/20 0320 04/13/20 0334 04/13/20 0648 04/13/20 1414 04/14/20 0325  NA 119*  --  123* 132* 138 135  K 4.7  --  5.2* 3.6 3.6 3.7  CL 78*  --   --  96* 102 101  CO2 22  --   --  23 27 27   GLUCOSE 1,400*  --   --  894* 289* 290*  BUN 17  --   --  16 14 12   CREATININE 1.93*  --   --  1.70* 1.30* 1.11  CALCIUM 10.3  --   --  9.8 9.3 8.8*  MG  --  1.8  --   --   --   --   PHOS  --  3.3  --   --   --   --    GFR: CrCl cannot be calculated (Unknown ideal weight.). Liver Function Tests: Recent Labs  Lab 04/13/20 0320  AST 27  ALT 25  ALKPHOS 68  BILITOT 2.0*  PROT 7.9  ALBUMIN 3.8   No results for input(s): LIPASE, AMYLASE in the last 168 hours. No results for input(s): AMMONIA in the last 168 hours. Coagulation Profile: No results for input(s): INR, PROTIME in the last 168 hours. Cardiac Enzymes: No results for input(s): CKTOTAL, CKMB, CKMBINDEX, TROPONINI in the last 168 hours. BNP (last 3 results) No results for input(s): PROBNP in the last 8760 hours. HbA1C: Recent Labs    04/13/20 0421  HGBA1C 12.9*   CBG: Recent Labs  Lab 04/13/20 2304 04/14/20 0009 04/14/20 0740 04/14/20 1106 04/14/20 1239  GLUCAP 201* 212* 327* 279* 256*   Lipid Profile: No results for input(s): CHOL, HDL, LDLCALC, TRIG, CHOLHDL, LDLDIRECT in the last 72 hours. Thyroid Function Tests: No results for input(s): TSH, T4TOTAL, FREET4, T3FREE, THYROIDAB in the last 72 hours. Anemia Panel: Recent Labs    04/13/20 0648  VITAMINB12 381  FOLATE 2.6*   Sepsis Labs: No results for input(s): PROCALCITON,  LATICACIDVEN in the last 168 hours.  Recent Results (from the past 240 hour(s))  SARS Coronavirus 2 by RT PCR (hospital order, performed in [facility] hospital lab) Nasopharyngeal Nasopharyngeal Swab     Status: None   Collection Time: 04/13/20  4:09 AM   Specimen: Nasopharyngeal Swab  Result Value Ref Range Status   SARS Coronavirus 2 NEGATIVE NEGATIVE Final    Comment: (NOTE) SARS-CoV-2 target nucleic acids are NOT DETECTED. The SARS-CoV-2 RNA is generally detectable in upper and lower respiratory specimens during the acute phase of infection. The lowest  concentration of SARS-CoV-2 viral copies this assay can detect is 250 copies / mL. A negative result does not preclude SARS-CoV-2 infection and should not be used as the sole basis for treatment or other patient management decisions.  A negative result may occur with improper specimen collection / handling, submission of specimen other than nasopharyngeal swab, presence of viral mutation(s) within the areas targeted by this assay, and inadequate number of viral copies (<250 copies / mL). A negative result must be combined with clinical observations, patient history, and epidemiological information. Fact Sheet for Patients:   StrictlyIdeas.no Fact Sheet for Healthcare Providers: BankingDealers.co.za This test is not yet approved or cleared  by the [place] FDA and has been authorized for detection and/or diagnosis of SARS-CoV-2 by FDA under an Emergency Use Authorization (EUA).  This EUA will remain in effect (meaning this test can be used) for the duration of the COVID-19 declaration under Section 564(b)(1) of the Act, 21 U.S.C. section 360bbb-3(b)(1), unless the authorization is terminated or revoked sooner. Performed at [facility], [place] [address] [place], [place] [address]   MRSA PCR Screening     Status: None   Collection Time: 04/13/20  6:49 PM   Specimen: Nasal  Mucosa; Nasopharyngeal  Result Value Ref Range Status   MRSA by PCR NEGATIVE NEGATIVE Final    Comment:        The GeneXpert MRSA Assay (FDA approved for NASAL specimens only), is one component of a comprehensive MRSA colonization surveillance program. It is not intended to diagnose MRSA infection nor to guide or monitor treatment for MRSA infections. Performed at [facility], [place] [address], [place], [place] [address]          Radiology Studies: CT ANGIO HEAD W OR WO CONTRAST  Result Date: 04/14/2020 CLINICAL DATA:  Carotid artery stenosis. Recent head trauma. EXAM: CT ANGIOGRAPHY HEAD AND NECK TECHNIQUE: Multidetector CT imaging of the head and neck was performed using the standard protocol during bolus administration of intravenous contrast. Multiplanar CT image reconstructions and MIPs were obtained to evaluate the vascular anatomy. Carotid stenosis measurements (when applicable) are obtained utilizing NASCET criteria, using the distal internal carotid diameter as the denominator. CONTRAST:  121mL OMNIPAQUE IOHEXOL 350 MG/ML SOLN COMPARISON:  CT head without contrast 04/13/2020 FINDINGS: CT HEAD FINDINGS Brain: Mild generalized atrophy and white matter hypoattenuation is again noted bilaterally. Slight thickening of the posterior falx is stable, likely ossification. A 4 mm meningioma is present posteriorly. No extra-axial hemorrhage or fluid is present. The brainstem and cerebellum are within normal limits. No acute infarct, hemorrhage, or mass lesion is present. Vascular: Atherosclerotic calcifications are again seen within the cavernous internal carotid arteries bilaterally. No hyperdense vessel is present. Skull: Right temporoparietal scalp soft tissue swelling is present. Skin staples are in place. No underlying fracture is present. Calvarium is intact. Sinuses: Mild circumferential mucosal thickening is present right maxillary sinus. No fluid level or fracture is present. The  paranasal sinuses and mastoid air cells are otherwise clear. Orbits: Bilateral lens replacements are noted. Globes and orbits are otherwise unremarkable. Review of the MIP images confirms the above findings CTA NECK FINDINGS Aortic arch: Atherosclerotic calcifications are present in the distal arch. No significant aneurysm is present. No significant stenosis is present at the great vessel origins. Right carotid system: Atherosclerotic irregularity is noted along the walls of proximal right common carotid artery without a significant stenosis. A high-grade, near occlusive stenosis is present in the proximal left ICA.  A tandem high-grade, near occlusive stenosis in the left ICA is noted 1.5 cm more distal. This results in asymmetric attenuation of the distal right ICA lumen. Left carotid system: The left common carotid artery demonstrates proximal tortuosity. Anterior atherosclerotic changes are present in the mid common carotid artery without a significant stenosis. Dense calcifications present about the left carotid bifurcation. The minimal luminal diameter of the proximal left ICA is 2.8 mm proximally. More distally scratched at 3 cm from the bifurcation is a high-grade, near occlusive stenosis. Non calcified plaque can be seen. More distal left ICA lumen is normal size. Vertebral arteries: Right vertebral artery is the dominant vessel. Atherosclerotic calcifications are present at the origins of both vessels. There is no significant stenosis on the right. Moderate proximal left vertebral artery stenosis is present. There is some irregularity in the left vertebral artery in the neck without a significant stenosis in the 3 year V4 segment. There is a moderate left vertebral artery stenosis at the dural margin just after the left PICA originates. No significant stenosis is present on the right. Skeleton: Posterior elements are fused at C2-3. Asymmetric degenerative facet changes are present on the right at C3-4 and  C4-5. Vertebral body heights are maintained. No focal lytic or blastic lesions are present. Other neck: Soft tissues of the neck are otherwise within normal limits. Upper chest: Patchy airspace opacity is present lung apices, right greater than left. Thoracic inlet is normal. Review of the MIP images confirms the above findings CTA HEAD FINDINGS Anterior circulation: Dense atherosclerotic changes are present within the cavernous internal carotid arteries. A high-grade stenosis is present at the paraophthalmic segment of the right ICA. No significant stenosis is present the left. A high-grade stenosis is present in the proximal right A1 segment. Left A1 segment is normal. The anterior communicating artery is patent. M1 segments are normal. MCA bifurcations are intact. ACA and MCA branch vessels are unremarkable. Posterior circulation: Right vertebral artery is the dominant vessel. The left vertebral artery is hypoplastic above the dura. 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 Both posterior cerebral arteries originate from basilar tip. The PCA branch vessels are within normal limits. Moderate narrowing is present in the distal right P2 segment. Venous sinuses: Dural sinuses are patent. The straight sinus and deep cerebral veins are intact. Cortical veins are unremarkable. No vascular malformations are present. Anatomic variants: None Review of the MIP images confirms the above findings IMPRESSION: 1. 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 2. High-grade, near occlusive, stenosis of the left internal carotid artery 3 cm from the bifurcation. The distal left ICA is the more normal vessel. 3. Moderate proximal left vertebral artery stenosis. 4. Moderate left vertebral artery stenosis at the dural margin just after the left PICA originates. 5. 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 6. Moderate narrowing of the distal right P2 segment. 7.  Mild generalized atrophy and white matter disease is stable. 8. 4 mm meningioma posteriorly. 9. No extra-axial hemorrhage. 10. Right temporoparietal scalp soft tissue swelling is improving. 11. Aortic Atherosclerosis (ICD10-I70.0). Electronically Signed   By: San Morelle M.D.   On: 04/14/2020 12:04   CT HEAD WO CONTRAST  Result Date: 04/13/2020 CLINICAL DATA:  Pain following trauma EXAM: CT HEAD WITHOUT CONTRAST TECHNIQUE: Contiguous axial images were obtained from the base of the skull through the vertex without intravenous contrast. COMPARISON:  Apr 13, 2020 study obtained earlier in the day  FINDINGS: Brain: The rather equivocal parafalcine thickening anteriorly is unchanged compared to earlier in the day, likely representing focal calcification in this area. There is no new opacity or new thickening in this area compared to earlier in the day. This area most likely represents localized is calcification. Elsewhere there is age related volume loss, stable. No mass, well-defined hemorrhage, extra-axial fluid collection, or midline shift. There is relatively mild patchy small vessel disease in the centra semiovale bilaterally. No acute appearing infarct evident. Vascular: No hyperdense vessels. There is calcification in the right distal vertebral artery and in the carotid siphon regions bilaterally. Skull: The bony calvarium appears intact. Sinuses/Orbits: There is mucosal thickening in the right maxillary antrum. There is mucosal thickening in several ethmoid air cells bilaterally. Orbits appear symmetric bilaterally. Other: There is opacification of multiple mastoid air cells bilaterally, stable. IMPRESSION: 1. No change in the anterior parafalcine region compared to earlier in the day. Suspect mild calcification in this area. 2. There is age related volume loss with patchy periventricular small vessel disease. No mass or acute infarct. No well-defined area of hemorrhage. No extra-axial fluid. 3.   There are foci of arterial vascular calcification. 4.  Foci of paranasal sinus disease and mastoid disease bilaterally. Electronically Signed   By: Lowella Grip III M.D.   On: 04/13/2020 09:09   CT Head Wo Contrast  Addendum Date: 04/13/2020   ADDENDUM REPORT: 04/13/2020 02:42 ADDENDUM: These results were called by telephone at the time of interpretation on 04/13/2020 at 2:42 am to provider DAVID [facility] , who verbally acknowledged these results. Electronically Signed   By: Lovena Le M.D.   On: 04/13/2020 02:42   Result Date: 04/13/2020 CLINICAL DATA:  Slipped and fell with positive head strike, laceration to the right temple EXAM: CT HEAD WITHOUT CONTRAST CT CERVICAL SPINE WITHOUT CONTRAST TECHNIQUE: Multidetector CT imaging of the head and cervical spine was performed following the standard protocol without intravenous contrast. Multiplanar CT image reconstructions of the cervical spine were also generated. COMPARISON:  Maxillofacial CT 05/03/2012, CT neck 05/04/2010 FINDINGS: CT HEAD FINDINGS Brain: There is a trace amount of right hyperdense parafalcine thickening which may be calcification though is more indeterminate in the setting of trauma and absence of direct visual comparison (3/26). No other sites suspicious for intracranial hemorrhage. No extra-axial collection. No mass effect or midline shift. No CT evidence of large vascular territory infarct. No convincing features of hydrocephaly. Symmetric prominence of the ventricles, cisterns and sulci compatible with parenchymal volume loss. Patchy areas of white matter hypoattenuation are most compatible with chronic microvascular angiopathy. Vascular: Atherosclerotic calcification of the carotid siphons and intradural vertebral arteries. No hyperdense vessel. Skull: Extensive soft tissue swelling and thickening over the right temporoparietal region with soft tissue laceration and hematoma of the parietal scalp (3/25) measuring up to a maximal  thickness of 9 mm. Overlying bandaging material is present. No subjacent calvarial fracture. No visible or suspected temporal bone fracture. Partial visualization of the reconstructive changes of the right mandible. Sinuses/Orbits: Periodontal disease including a periapical lucency of the right maxillary molars. 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 Remaining paranasal sinuses are predominantly clear. Small dependent bilateral mastoid effusions. Middle ear cavities are clear. Ossicular chains are normally configured. Debris in the right external auditory canal. Other: None CT CERVICAL SPINE FINDINGS Alignment: Cervical stabilization collar is in place at the time of examination. Preservation of the normal cervical lordosis. Likely degenerative anterolisthesis C4 on  C5 with most pronounced facet changes at these levels. No evidence of traumatic listhesis. No abnormally widened, perched or jumped facets. Partial fusion of the posterior C2-3 vertebral bodies and the left articular facets. Normal alignment of the craniocervical and atlantoaxial articulations. Skull base and vertebrae: The osseous structures appear diffusely demineralized which may limit detection of small or nondisplaced fractures. Partial fusion of C2-3, as above. Advanced arthrosis of the C1-2 articulation anteriorly with some calcific pannus formation posterior to the dens. No visible skull base fracture. No acute cervical spine fracture or vertebral body height loss. No worrisome osseous lesions. Soft tissues and spinal canal: No pre or paravertebral fluid or swelling. No visible canal hematoma. Disc levels: Multilevel intervertebral disc height loss with spondylitic endplate changes. Small posterior disc osteophyte complexes at several levels without significant canal stenosis. Uncinate spurring and facet hypertrophic changes result in mild-to-moderate multilevel foraminal narrowing most pronounced at  C4-5. Upper chest: Some biapical pleuroparenchymal scarring is noted. No acute abnormality in the lung apices. Other: Extensive cervical carotid atherosclerosis. Diminutive appearance of the thyroid. Paucity of subcutaneous fat. IMPRESSION: CT HEAD 1. Trace amount of right hyperdense parafalcine thickening which may be calcification though is more indeterminate in the setting of trauma and absence of direct visual comparison. Consider short-term (6 hour) interval follow-up to assess for stability or resolution. 2. No other acute intracranial abnormality. Background parenchymal volume loss and chronic microvascular angiopathy. 3. Extensive soft tissue swelling and thickening over the right temporoparietal region with soft tissue laceration and hematoma of the parietal scalp. No subjacent calvarial fracture. CT CERVICAL SPINE 1. No evidence of acute fracture or traumatic listhesis of the cervical spine. Osseous structures appear diffusely demineralized which may limit detection of small or nondisplaced fractures. 2. Multilevel degenerative disc disease and facet hypertrophic changes of the cervical spine, detailed above. 3. Extensive cervical carotid atherosclerosis. Currently attempting to contact the ordering provider with a critical value result. Addendum will be submitted upon case discussion. Electronically Signed: By: Lovena Le M.D. On: 04/13/2020 02:33   CT ANGIO NECK W OR WO CONTRAST  Result Date: 04/14/2020 CLINICAL DATA:  Carotid artery stenosis. Recent head trauma. EXAM: CT ANGIOGRAPHY HEAD AND NECK TECHNIQUE: Multidetector CT imaging of the head and neck was performed using the standard protocol during bolus administration of intravenous contrast. Multiplanar CT image reconstructions and MIPs were obtained to evaluate the vascular anatomy. Carotid stenosis measurements (when applicable) are obtained utilizing NASCET criteria, using the distal internal carotid diameter as the denominator. CONTRAST:   197mL OMNIPAQUE IOHEXOL 350 MG/ML SOLN COMPARISON:  CT head without contrast 04/13/2020 FINDINGS: CT HEAD FINDINGS Brain: Mild generalized atrophy and white matter hypoattenuation is again noted bilaterally. Slight thickening of the posterior falx is stable, likely ossification. A 4 mm meningioma is present posteriorly. No extra-axial hemorrhage or fluid is present. The brainstem and cerebellum are within normal limits. No acute infarct, hemorrhage, or mass lesion is present. Vascular: Atherosclerotic calcifications are again seen within the cavernous internal carotid arteries bilaterally. No hyperdense vessel is present. Skull: Right temporoparietal scalp soft tissue swelling is present. Skin staples are in place. No underlying fracture is present. Calvarium is intact. Sinuses: Mild circumferential mucosal thickening is present right maxillary sinus. No fluid level or fracture is present. The paranasal sinuses and mastoid air cells are otherwise clear. Orbits: Bilateral lens replacements are noted. Globes and orbits are otherwise unremarkable. Review of the MIP images confirms the above findings CTA NECK FINDINGS Aortic arch: Atherosclerotic calcifications are present in  the distal arch. No significant aneurysm is present. No significant stenosis is present at the great vessel origins. Right carotid system: Atherosclerotic irregularity is noted along the walls of proximal right common carotid artery without a significant stenosis. A high-grade, near occlusive stenosis is present in the proximal left ICA. A tandem high-grade, near occlusive stenosis in the left ICA is noted 1.5 cm more distal. This results in asymmetric attenuation of the distal right ICA lumen. Left carotid system: The left common carotid artery demonstrates proximal tortuosity. Anterior atherosclerotic changes are present in the mid common carotid artery without a significant stenosis. Dense calcifications present about the left carotid  bifurcation. The minimal luminal diameter of the proximal left ICA is 2.8 mm proximally. More distally scratched at 3 cm from the bifurcation is a high-grade, near occlusive stenosis. Non calcified plaque can be seen. More distal left ICA lumen is normal size. Vertebral arteries: Right vertebral artery is the dominant vessel. Atherosclerotic calcifications are present at the origins of both vessels. There is no significant stenosis on the right. Moderate proximal left vertebral artery stenosis is present. There is some irregularity in the left vertebral artery in the neck without a significant stenosis in the 3 year V4 segment. There is a moderate left vertebral artery stenosis at the dural margin just after the left PICA originates. No significant stenosis is present on the right. Skeleton: Posterior elements are fused at C2-3. Asymmetric degenerative facet changes are present on the right at C3-4 and C4-5. Vertebral body heights are maintained. No focal lytic or blastic lesions are present. Other neck: Soft tissues of the neck are otherwise within normal limits. Upper chest: Patchy airspace opacity is present lung apices, right greater than left. Thoracic inlet is normal. Review of the MIP images confirms the above findings CTA HEAD FINDINGS Anterior circulation: Dense atherosclerotic changes are present within the cavernous internal carotid arteries. A high-grade stenosis is present at the paraophthalmic segment of the right ICA. No significant stenosis is present the left. A high-grade stenosis is present in the proximal right A1 segment. Left A1 segment is normal. The anterior communicating artery is patent. M1 segments are normal. MCA bifurcations are intact. ACA and MCA branch vessels are unremarkable. Posterior circulation: Right vertebral artery is the dominant vessel. The left vertebral artery is hypoplastic above the dura. 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 Both posterior cerebral arteries originate from  basilar tip. The PCA branch vessels are within normal limits. Moderate narrowing is present in the distal right P2 segment. Venous sinuses: Dural sinuses are patent. The straight sinus and deep cerebral veins are intact. Cortical veins are unremarkable. No vascular malformations are present. Anatomic variants: None Review of the MIP images confirms the above findings IMPRESSION: 1. 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 2. High-grade, near occlusive, stenosis of the left internal carotid artery 3 cm from the bifurcation. The distal left ICA is the more normal vessel. 3. Moderate proximal left vertebral artery stenosis. 4. Moderate left vertebral artery stenosis at the dural margin just after the left PICA originates. 5. 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 6. Moderate narrowing of the distal right P2 segment. 7. Mild generalized atrophy and white matter disease is stable. 8. 4 mm meningioma posteriorly. 9. No extra-axial hemorrhage. 10. Right temporoparietal scalp soft tissue swelling is improving. 11. Aortic Atherosclerosis (ICD10-I70.0). Electronically Signed   By: San Morelle M.D.   On: 04/14/2020 12:04  CT Cervical Spine Wo Contrast  Addendum Date: 04/13/2020   ADDENDUM REPORT: 04/13/2020 02:42 ADDENDUM: These results were called by telephone at the time of interpretation on 04/13/2020 at 2:42 am to provider DAVID [facility] , who verbally acknowledged these results. Electronically Signed   By: Lovena Le M.D.   On: 04/13/2020 02:42   Result Date: 04/13/2020 CLINICAL DATA:  Slipped and fell with positive head strike, laceration to the right temple EXAM: CT HEAD WITHOUT CONTRAST CT CERVICAL SPINE WITHOUT CONTRAST TECHNIQUE: Multidetector CT imaging of the head and cervical spine was performed following the standard protocol without intravenous contrast. Multiplanar CT image reconstructions of the cervical  spine were also generated. COMPARISON:  Maxillofacial CT 05/03/2012, CT neck 05/04/2010 FINDINGS: CT HEAD FINDINGS Brain: There is a trace amount of right hyperdense parafalcine thickening which may be calcification though is more indeterminate in the setting of trauma and absence of direct visual comparison (3/26). No other sites suspicious for intracranial hemorrhage. No extra-axial collection. No mass effect or midline shift. No CT evidence of large vascular territory infarct. No convincing features of hydrocephaly. Symmetric prominence of the ventricles, cisterns and sulci compatible with parenchymal volume loss. Patchy areas of white matter hypoattenuation are most compatible with chronic microvascular angiopathy. Vascular: Atherosclerotic calcification of the carotid siphons and intradural vertebral arteries. No hyperdense vessel. Skull: Extensive soft tissue swelling and thickening over the right temporoparietal region with soft tissue laceration and hematoma of the parietal scalp (3/25) measuring up to a maximal thickness of 9 mm. Overlying bandaging material is present. No subjacent calvarial fracture. No visible or suspected temporal bone fracture. Partial visualization of the reconstructive changes of the right mandible. Sinuses/Orbits: Periodontal disease including a periapical lucency of the right maxillary molars. 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 Remaining paranasal sinuses are predominantly clear. Small dependent bilateral mastoid effusions. Middle ear cavities are clear. Ossicular chains are normally configured. Debris in the right external auditory canal. Other: None CT CERVICAL SPINE FINDINGS Alignment: Cervical stabilization collar is in place at the time of examination. Preservation of the normal cervical lordosis. Likely degenerative anterolisthesis C4 on C5 with most pronounced facet changes at these levels. No evidence of traumatic listhesis.  No abnormally widened, perched or jumped facets. Partial fusion of the posterior C2-3 vertebral bodies and the left articular facets. Normal alignment of the craniocervical and atlantoaxial articulations. Skull base and vertebrae: The osseous structures appear diffusely demineralized which may limit detection of small or nondisplaced fractures. Partial fusion of C2-3, as above. Advanced arthrosis of the C1-2 articulation anteriorly with some calcific pannus formation posterior to the dens. No visible skull base fracture. No acute cervical spine fracture or vertebral body height loss. No worrisome osseous lesions. Soft tissues and spinal canal: No pre or paravertebral fluid or swelling. No visible canal hematoma. Disc levels: Multilevel intervertebral disc height loss with spondylitic endplate changes. Small posterior disc osteophyte complexes at several levels without significant canal stenosis. Uncinate spurring and facet hypertrophic changes result in mild-to-moderate multilevel foraminal narrowing most pronounced at C4-5. Upper chest: Some biapical pleuroparenchymal scarring is noted. No acute abnormality in the lung apices. Other: Extensive cervical carotid atherosclerosis. Diminutive appearance of the thyroid. Paucity of subcutaneous fat. IMPRESSION: CT HEAD 1. Trace amount of right hyperdense parafalcine thickening which may be calcification though is more indeterminate in the setting of trauma and absence of direct visual comparison. Consider short-term (6 hour) interval follow-up to assess for stability or resolution.  2. No other acute intracranial abnormality. Background parenchymal volume loss and chronic microvascular angiopathy. 3. Extensive soft tissue swelling and thickening over the right temporoparietal region with soft tissue laceration and hematoma of the parietal scalp. No subjacent calvarial fracture. CT CERVICAL SPINE 1. No evidence of acute fracture or traumatic listhesis of the cervical spine.  Osseous structures appear diffusely demineralized which may limit detection of small or nondisplaced fractures. 2. Multilevel degenerative disc disease and facet hypertrophic changes of the cervical spine, detailed above. 3. Extensive cervical carotid atherosclerosis. Currently attempting to contact the ordering provider with a critical value result. Addendum will be submitted upon case discussion. Electronically Signed: By: Lovena Le M.D. On: 04/13/2020 02:33   DG CHEST PORT 1 VIEW  Result Date: 04/13/2020 CLINICAL DATA:  Cough EXAM: PORTABLE CHEST 1 VIEW COMPARISON:  06/13/2012 FINDINGS: Heart is normal size. Mild peribronchial thickening. No confluent opacities or effusions. No acute bony abnormality. IMPRESSION: Mild bronchitic changes. Electronically Signed   By: Rolm Baptise M.D.   On: 04/13/2020 07:07   VAS US CAROTID  Result Date: 04/13/2020 Carotid Arterial Duplex Study Indications:       Syncope and CT of spine shows extensive cervical carotid                    atherosclerosis. Risk Factors:      Hypertension, Diabetes. Other Factors:     CKD III, HIV. tonsillar cancer, history of TB. Comparison Study:  No prior study on file Performing Technologist: Sharion Dove RVS  Examination Guidelines: A complete evaluation includes B-mode imaging, spectral Doppler, color Doppler, and power Doppler as needed of all accessible portions of each vessel. Bilateral testing is considered an integral part of a complete examination. Limited examinations for reoccurring indications may be performed as noted.  Right Carotid Findings: +----------+--------+--------+--------+---------------------+------------------+             PSV cm/s EDV cm/s Stenosis Plaque Description    Comments            +----------+--------+--------+--------+---------------------+------------------+  CCA Prox   112      11                                      intimal thickening   +----------+--------+--------+--------+---------------------+------------------+  CCA Distal 34       6                                       intimal thickening  +----------+--------+--------+--------+---------------------+------------------+  ICA Prox   332      110      80-99%   calcific and                                                                     irregular                                 +----------+--------+--------+--------+---------------------+------------------+  ICA Mid    163      66                                                          +----------+--------+--------+--------+---------------------+------------------+  ICA Distal 93       24                                                          +----------+--------+--------+--------+---------------------+------------------+  ECA        162      17                                                          +----------+--------+--------+--------+---------------------+------------------+ +----------+--------+-------+--------+-------------------+             PSV cm/s EDV cms Describe Arm Pressure (mmHG)  +----------+--------+-------+--------+-------------------+  Subclavian 83                                             +----------+--------+-------+--------+-------------------+ +---------+--------+--+--------+--+  Vertebral PSV cm/s 53 EDV cm/s 16  +---------+--------+--+--------+--+  Left Carotid Findings: +----------+--------+--------+--------+------------------+------------------+             PSV cm/s EDV cm/s Stenosis Plaque Description Comments            +----------+--------+--------+--------+------------------+------------------+  CCA Prox   93       28                                   intimal thickening  +----------+--------+--------+--------+------------------+------------------+  CCA Distal 79       21                                   intimal thickening  +----------+--------+--------+--------+------------------+------------------+  ICA  Prox   45       21                calcific           Shadowing           +----------+--------+--------+--------+------------------+------------------+  ICA Distal 126      57                                   turbulent flow      +----------+--------+--------+--------+------------------+------------------+  ECA        128      21                                                       +----------+--------+--------+--------+------------------+------------------+ +---------+--------+--+--------+--+  Vertebral PSV cm/s 88 EDV cm/s 25  +---------+--------+--+--------+--+   Summary: Right Carotid: Velocities in the right ICA are consistent with a 80-99%                stenosis. Left Carotid: Velocities in the left ICA are consistent with a 1-39% stenosis.  Calcific plaque could obscure higher velocities. Vertebrals:  Bilateral vertebral arteries demonstrate antegrade flow. Subclavians: Left subclavian artery was not visualized. Normal flow hemodynamics              were seen in the right subclavian artery. Clavicle. *See table(s) above for measurements and observations.  Electronically signed by Ruta Hinds MD on 04/13/2020 at 6:33:12 PM.    Final    US Abdomen Limited RUQ  Result Date: 04/13/2020 CLINICAL DATA:  Elevated serum bilirubin EXAM: ULTRASOUND ABDOMEN LIMITED RIGHT UPPER QUADRANT COMPARISON:  None. FINDINGS: Gallbladder: Multiple shadowing gallstones. No gallbladder wall thickening, pericholecystic edema, or focal tenderness. Echogenic areas with subtle ring down artifact is likely floating cholesterol crystals and adenomyosis. Common bile duct: Diameter: 5 mm.  Where visualized, no filling defect. Liver: No focal lesion identified. Within normal limits in parenchymal echogenicity. Portal vein is patent on color Doppler imaging with normal direction of blood flow towards the liver. Other: Incidental small right upper pole renal cyst. IMPRESSION: Cholelithiasis without findings of acute  cholecystitis. Electronically Signed   By: Monte Fantasia M.D.   On: 04/13/2020 07:05        Scheduled Meds:  abacavir-dolutegravir-lamiVUDine  1 tablet Oral Daily   aspirin EC  81 mg Oral Daily   clopidogrel  75 mg Oral Daily   FLUoxetine  20 mg Oral Daily   folic acid  1 mg Oral Daily   insulin aspart  0-5 Units Subcutaneous QHS   insulin aspart  0-9 Units Subcutaneous TID WC   insulin aspart  3 Units Subcutaneous TID WC   insulin glargine  15 Units Subcutaneous Daily   levothyroxine  50 mcg Oral QAC breakfast   rosuvastatin  10 mg Oral Daily   temazepam  30 mg Oral QHS   vitamin B-12  100 mcg Oral Daily   Continuous Infusions:  sodium chloride 100 mL/hr at 04/13/20 1932   sodium chloride 50 mL/hr at 04/13/20 2103     LOS: 1 day    Time spent: 35 minutes    Nafisa Olds A Excell Neyland, MD [facility]   If 7PM-7AM, please contact night-coverage [email or web address]  04/14/2020, 1:50 PM

## 2020-04-14 NOTE — Progress Notes (Signed)
Initial Nutrition Assessment  DOCUMENTATION CODES:   Non-severe (moderate) malnutrition in context of chronic illness  INTERVENTION:  Provide Glucerna Shake po TID, each supplement provides 220 kcal and 10 grams of protein.  Encourage adequate PO intake.   Diet education regarding Diabetes given.   NUTRITION DIAGNOSIS:   Moderate Malnutrition related to chronic illness(HIV) as evidenced by moderate fat depletion, moderate muscle depletion.  GOAL:   Patient will meet greater than or equal to 90% of their needs  MONITOR:   PO intake, Supplement acceptance, Skin, Weight trends, Labs, I & O's  REASON FOR ASSESSMENT:   Consult Diet education  ASSESSMENT:   70 year old with past medical history significant for anxiety, CKD stage III, GERD, HIV, hypertension, hyperlipidemia hypothyroidism, history of tonsillar cancer, history of TB presents for evaluation of head injury after mechanical fall.  Meal completion has been 25-95%. Pt reports having a decreased appetite which has been ongoing over the past ~2 months. He reports usually consuming 1 full meal a day (dinner) and occasionally snacks throughout the day. Pt reports swallowing/chewing difficulties due to no saliva production from history of tonsillar cancer. Pt reports difficulties with eating at times, however ensures to foods are chopped, soft, and eaten with sauce/gravy. RD to order nutritional supplements to aid in increased caloric and protein needs. Educated on the importance of increased caloric and protein needs.   RD consulted for diet education regarding new onset diabetes. Family member at bedside. Handout "Counting Carbohydrates for People with Diabetes" from the Academy of Nutrition and Dietetics Manual given. Discussed different food groups and their effects on blood sugar, emphasizing carbohydrate-containing foods. Provided list of carbohydrates and recommended serving sizes of common foods. Discussed importance of  controlled and consistent carbohydrate intake throughout the day. Discussed diabetic friendly drink options and snack ideas. Teach back method used.  NUTRITION - FOCUSED PHYSICAL EXAM:    Most Recent Value  Orbital Region  Unable to assess  Upper Arm Region  Moderate depletion  Thoracic and Lumbar Region  Moderate depletion  Buccal Region  Unable to assess  Temple Region  Unable to assess  Clavicle Bone Region  Moderate depletion  Clavicle and Acromion Bone Region  Moderate depletion  Scapular Bone Region  Unable to assess  Dorsal Hand  Unable to assess  Patellar Region  Moderate depletion  Anterior Thigh Region  Moderate depletion  Posterior Calf Region  Moderate depletion  Edema (RD Assessment)  None  Hair  Reviewed  Mouth  Reviewed  Skin  Reviewed  Nails  Reviewed      Labs and medications reviewed.   Diet Order:   Diet Order            Diet heart healthy/carb modified Room service appropriate? Yes; Fluid consistency: Thin  Diet effective now              EDUCATION NEEDS:   Education needs have been addressed  Skin:  Skin Assessment: Reviewed RN Assessment  Last BM:  5/23  Height:   Ht Readings from Last 1 Encounters:  01/31/19 5\' 10"  (1.778 m)    Weight:   Wt Readings from Last 1 Encounters:  10/07/19 73.9 kg    BMI:  There is no height or weight on file to calculate BMI.  Estimated Nutritional Needs:   Kcal:  2050-2250  Protein:  105-115 grams  Fluid:  >/= 2 L/day   Corrin Parker, MS, RD, LDN RD pager number/after hours weekend pager number on Amion.

## 2020-04-14 NOTE — Progress Notes (Signed)
Physical Therapy Treatment Patient Details Name: Christopher [name] MRN: [id number] DOB: Oct 10, 1951 Today's Date: 04/14/2020    History of Present Illness DEUNDRE ELA is a 69 y.o. male with medical history significant of anxiety, CKD 3, GERD, HIV, benign familial essential tremors, hypertension, hyperlipidemia, neuropathy, neuromuscular disorder, hypothyroidism, history of tonsillar cancer, history of TB presenting for evaluation of head injury after a mechanical fall. Found to have HHS/new onset diabetes and carotid artery stenosis. Head lac stapled in the ED.  CT scan abnormal, but thought to be calcification, not active bleed.     PT Comments    Pt looks better on his feet today, but still needed more assist without AD.  He looked much better with a RW, but reports he does not have space in his home to maneuver a RW well enough.  He does have a cane, so we will try a cane next session and progress to hallway distance gait.  He reports having the support of his husband at discharge 24/7 initially.   PT to follow acutely for deficits listed below.     Follow Up Recommendations  Home health PT;Supervision/Assistance - 24 hour(pt politely declining recommendation for [facility] PT)     Equipment Recommendations  None recommended by PT(RW will not fit around his home and he has a cane)    Recommendations for Other Services   NA     Precautions / Restrictions Precautions Precautions: Fall Precaution Comments: Fall prior to admission likely due to medical issues, not balance    Mobility  Bed Mobility Overal bed mobility: Needs Assistance Bed Mobility: Supine to Sit     Supine to sit: Supervision     General bed mobility comments: supervision for safety  Transfers Overall transfer level: Needs assistance Equipment used: 1 person hand held assist;Rolling walker (2 wheeled) Transfers: Sit to/from Stand Sit to Stand: Mod assist;Min assist         General transfer comment: Inital stand  without RW mod assist for balance with heavy support of bil legs on base board of bed.  Second stand with RW and min assist, much improved.   Ambulation/Gait Ambulation/Gait assistance: Min guard Gait Distance (Feet): 20 Feet Assistive device: Rolling walker (2 wheeled) Gait Pattern/deviations: Step-through pattern;Shuffle Gait velocity: decreased   General Gait Details: Pt much more steady with RW, cues for safety, upright posture, slow turns to avoid dizziness.  Pt reports he feels more secure with walker, however, it would not fit around his home (too tight and narrow).  He does have a cane at home and we will practice with the cane next session and progress gait to the hallway.  This will give him a sense of if he needs his husband right there with him or not.           Balance Overall balance assessment: Needs assistance Sitting-balance support: Feet supported;No upper extremity supported Sitting balance-Leahy Scale: Good     Standing balance support: Bilateral upper extremity supported;Single extremity supported Standing balance-Leahy Scale: Poor Standing balance comment: needs external support in standing, especially without RW                            Cognition Arousal/Alertness: Awake/alert Behavior During Therapy: WFL for tasks assessed/performed Overall Cognitive Status: Within Functional Limits for tasks assessed  Pertinent Vitals/Pain Pain Assessment: No/denies pain           PT Goals (current goals can now be found in the care plan section) Acute Rehab PT Goals Patient Stated Goal: to go home Progress towards PT goals: Progressing toward goals    Frequency    Min 3X/week      PT Plan Current plan remains appropriate       AM-PAC PT "6 Clicks" Mobility   Outcome Measure  Help needed turning from your back to your side while in a flat bed without using bedrails?:  None Help needed moving from lying on your back to sitting on the side of a flat bed without using bedrails?: None Help needed moving to and from a bed to a chair (including a wheelchair)?: A Little Help needed standing up from a chair using your arms (e.g., wheelchair or bedside chair)?: A Little Help needed to walk in hospital room?: A Little Help needed climbing 3-5 steps with a railing? : A Little 6 Click Score: 20    End of Session   Activity Tolerance: Patient tolerated treatment well Patient left: in chair;with call bell/phone within reach   PT Visit Diagnosis: History of falling (Z91.81);Repeated falls (R29.6);Muscle weakness (generalized) (M62.81);Unsteadiness on feet (R26.81)     Time: [id number] PT Time Calculation (min) (ACUTE ONLY): 25 min  Charges:  $Gait Training: 8-22 mins $Therapeutic Activity: 8-22 mins                    Verdene Lennert, PT, DPT  Acute Rehabilitation [phone] pager #[phone] office     04/14/2020, 4:25 PM

## 2020-04-15 ENCOUNTER — Other Ambulatory Visit: Payer: Self-pay | Admitting: Cardiology

## 2020-04-15 DIAGNOSIS — R011 Cardiac murmur, unspecified: Secondary | ICD-10-CM

## 2020-04-15 DIAGNOSIS — N179 Acute kidney failure, unspecified: Secondary | ICD-10-CM

## 2020-04-15 DIAGNOSIS — Z794 Long term (current) use of insulin: Secondary | ICD-10-CM

## 2020-04-15 DIAGNOSIS — S0990XA Unspecified injury of head, initial encounter: Secondary | ICD-10-CM

## 2020-04-15 DIAGNOSIS — Z01810 Encounter for preprocedural cardiovascular examination: Secondary | ICD-10-CM

## 2020-04-15 DIAGNOSIS — R17 Unspecified jaundice: Secondary | ICD-10-CM

## 2020-04-15 DIAGNOSIS — S0101XA Laceration without foreign body of scalp, initial encounter: Secondary | ICD-10-CM

## 2020-04-15 DIAGNOSIS — I6523 Occlusion and stenosis of bilateral carotid arteries: Secondary | ICD-10-CM

## 2020-04-15 LAB — BASIC METABOLIC PANEL
Anion gap: 9 (ref 5–15)
BUN: 10 mg/dL (ref 8–23)
CO2: 26 mmol/L (ref 22–32)
Calcium: 9.2 mg/dL (ref 8.9–10.3)
Chloride: 100 mmol/L (ref 98–111)
Creatinine, Ser: 1.04 mg/dL (ref 0.61–1.24)
GFR calc Af Amer: >60 mL/min (ref >60)
GFR calc non Af Amer: >60 mL/min (ref >60)
Glucose, Bld: 251 mg/dL — ABNORMAL HIGH (ref 70–99)
Potassium: 3.2 mmol/L — ABNORMAL LOW (ref 3.5–5.1)
Sodium: 135 mmol/L (ref 135–145)

## 2020-04-15 LAB — CBC
HCT: 31.8 % — ABNORMAL LOW (ref 39.0–52.0)
Hemoglobin: 10.9 g/dL — ABNORMAL LOW (ref 13.0–17.0)
MCH: 33.6 pg (ref 26.0–34.0)
MCHC: 34.3 g/dL (ref 30.0–36.0)
MCV: 98.1 fL (ref 80.0–100.0)
Platelets: 149 10*3/uL — ABNORMAL LOW (ref 150–400)
RBC: 3.24 MIL/uL — ABNORMAL LOW (ref 4.22–5.81)
RDW: 14.1 % (ref 11.5–15.5)
WBC: 3.7 10*3/uL — ABNORMAL LOW (ref 4.0–10.5)
nRBC: 0 % (ref 0.0–0.2)

## 2020-04-15 LAB — GLUCOSE, CAPILLARY
Glucose-Capillary: 330 mg/dL — ABNORMAL HIGH (ref 70–99)
Glucose-Capillary: 279 mg/dL — ABNORMAL HIGH (ref 70–99)
Glucose-Capillary: 179 mg/dL — ABNORMAL HIGH (ref 70–99)
Glucose-Capillary: 145 mg/dL — ABNORMAL HIGH (ref 70–99)

## 2020-04-15 MED ORDER — POTASSIUM CHLORIDE CRYS ER 20 MEQ PO TBCR
40.0000 meq | EXTENDED_RELEASE_TABLET | Freq: Once | ORAL | Status: AC
  Administered 2020-04-15: 40 meq via ORAL
  Filled 2020-04-15: qty 2

## 2020-04-15 MED ORDER — INSULIN ASPART 100 UNIT/ML ~~LOC~~ SOLN
6.0000 [IU] | Freq: Three times a day (TID) | SUBCUTANEOUS | Status: DC
  Administered 2020-04-15 – 2020-04-16 (×3): 6 [IU] via SUBCUTANEOUS

## 2020-04-15 MED ORDER — METOPROLOL SUCCINATE ER 25 MG PO TB24
25.0000 mg | ORAL_TABLET | Freq: Every day | ORAL | Status: DC
  Administered 2020-04-15 – 2020-04-16 (×2): 25 mg via ORAL
  Filled 2020-04-15 (×2): qty 1

## 2020-04-15 MED ORDER — INSULIN GLARGINE 100 UNIT/ML ~~LOC~~ SOLN
22.0000 [IU] | Freq: Every day | SUBCUTANEOUS | Status: DC
  Administered 2020-04-16: 22 [IU] via SUBCUTANEOUS
  Filled 2020-04-15: qty 0.22

## 2020-04-15 MED ORDER — FOLIC ACID 1 MG PO TABS
2.0000 mg | ORAL_TABLET | Freq: Every day | ORAL | Status: DC
  Administered 2020-04-16: 2 mg via ORAL
  Filled 2020-04-15: qty 2

## 2020-04-15 NOTE — Consult Note (Signed)
CARDIOLOGY CONSULT NOTE  Patient ID: SAVIEN HRDLICKA MRN: [id number] DOB/AGE: 70-Oct-1952 70 y.o.  Admit date: 04/13/2020 Referring Physician: Jonetta Osgood, MD Primary Physician:  [name], MD Reason for Consultation: Preoperative risk stratification  HPI:  Christopher [name] is a 69 y.o. male who presents with a chief complaint of " fall/head injury." His past medical history and cardiovascular risk factors include: Newly diagnosed diabetes mellitus type 2 anxiety, chronic kidney disease stage III, GERD, HIV, hypertension, hyperlipidemia, neuromuscular disorder, hypothyroidism, history of tonsillar cancer, history of TB, advanced age.  Patient was admitted to the hospital on Apr 13, 2020 after presenting with a chief complaint of mechanical fall and head injury.  It appears the patient has been urinating more frequently in the night prior to admission he got up in the middle of the night to go to the bathroom and while walking in the house he lost balance and fell.  When he fell patient states a heavy lamp fell on his head.  He did not lose consciousness.  Patient presented to the hospital for further evaluation. Patient was diagnosed with hyperosmolar hyperglycemic syndrome/new onset of diabetes ( blood sugar on admission 1400 and a1c 12.9) and is currently getting treated under the care of primary team.  He also has acute kidney injury on chronic kidney disease.  Patient underwent CT of the head and CT angio head and neck results noted below.   Cardiology was consulted for preprocedural risk stratification as the patient is found to have high-grade bilateral carotid stenosis and plans to undergo bilateral carotid stenting with vascular surgery.  The procedure is tentatively scheduled for May 04, 2020.  Patient denies chest pain or shortness of breath at rest or with effort related activities. Patient's functional status is limited to gardening and going up and down stairs at his  residence and is able to do his activities of daily living.  Patient does not participate in any structured exercise program or daily routine.  Denies prior history of coronary artery disease, myocardial infarction, congestive heart failure, deep venous thrombosis, pulmonary embolism, stroke, transient ischemic attack.  ALLERGIES: Allergies  Allergen Reactions  . Codeine Other (See Comments)    "makes me crazy"  . Sulfamethoxazole-Trimethoprim Itching and Other (See Comments)    "makes me crazy"  . Sulfonamide Derivatives Itching and Other (See Comments)    "makes me crazy"    PAST MEDICAL HISTORY: Past Medical History:  Diagnosis Date  . Anxiety   . Cataract   . Chronic kidney disease   . GERD (gastroesophageal reflux disease)   . Heart murmur   . HIV DISEASE 12/01/2006  . HYPERLIPIDEMIA, WITH LOW HDL 01/03/2008  . Hypertension   . HYPERTENSION 12/01/2006  . HYPOGONADISM 12/23/2009  . Neuromuscular disorder ([place])   . SYPHILIS, [place], LATENT NOS 01/18/2007  . Thyroid disease   . Tonsillar cancer ([place])    tonsillar ca   . Tuberculosis    history of TB about 30 years ago    PAST SURGICAL HISTORY: Past Surgical History:  Procedure Laterality Date  . COLONOSCOPY    . TONSILECTOMY, ADENOIDECTOMY, BILATERAL MYRINGOTOMY AND TUBES     tonsil cancer   . UPPER GASTROINTESTINAL ENDOSCOPY      FAMILY HISTORY: The patient family history includes COPD in his mother; Diabetes in his mother; Heart attack in his father.   SOCIAL HISTORY:  The patient  reports that he has quit smoking. He has never used smokeless tobacco. He reports that he  does not drink alcohol or use drugs.  MEDICATIONS: Medications Prior to Admission  Medication Sig Dispense Refill Last Dose  . clonazePAM (KLONOPIN) 1 MG tablet TAKE 1 TABLET 3 TIMES A DAY AS NEEDED FOR ANXIETY (Patient taking differently: Take 1 mg by mouth 3 (three) times daily as needed for anxiety. ) 90 tablet 2 04/12/2020 at Unknown time  .  diphenhydrAMINE (BENADRYL) 25 MG tablet Take 50 mg by mouth every 6 (six) hours as needed.   Past Week at Unknown time  . fenofibrate (TRICOR) 145 MG tablet TAKE ONE TABLET BY MOUTH DAILY (Patient taking differently: Take 145 mg by mouth daily. ) 90 tablet 0 04/12/2020 at Unknown time  . FLUoxetine (PROZAC) 20 MG capsule TAKE ONE CAPSULE BY MOUTH DAILY (Patient taking differently: Take 20 mg by mouth daily. ) 90 capsule 0 04/12/2020 at Unknown time  . levothyroxine (SYNTHROID, LEVOTHROID) 50 MCG tablet Take 1 tablet (50 mcg total) by mouth daily before breakfast. 30 tablet 0 04/12/2020 at Unknown time  . metoCLOPramide (REGLAN) 5 MG tablet TAKE ONE TABLET BY MOUTH THREE TIMES A DAY BEFORE MEALS (Patient taking differently: Take 5 mg by mouth 3 (three) times daily before meals. ) 90 tablet 5 04/12/2020 at Unknown time  . pantoprazole (PROTONIX) 40 MG tablet TAKE ONE TABLET BY MOUTH DAILY (Patient taking differently: Take 40 mg by mouth daily. ) 90 tablet 2 04/12/2020 at Unknown time  . temazepam (RESTORIL) 30 MG capsule Take 30 mg by mouth at bedtime.  5 04/12/2020 at Unknown time  . TRIUMEQ [id number] MG tablet TAKE ONE TABLET BY MOUTH DAILY (Patient taking differently: Take 1 tablet by mouth daily. ) 90 tablet 3 04/12/2020 at Unknown time    Current Outpatient Medications  Medication Instructions  . clonazePAM (KLONOPIN) 1 MG tablet TAKE 1 TABLET 3 TIMES A DAY AS NEEDED FOR ANXIETY  . diphenhydrAMINE (BENADRYL) 50 mg, Oral, Every 6 hours PRN  . fenofibrate (TRICOR) 145 MG tablet TAKE ONE TABLET BY MOUTH DAILY  . FLUoxetine (PROZAC) 20 MG capsule TAKE ONE CAPSULE BY MOUTH DAILY  . levothyroxine (SYNTHROID) 50 mcg, Oral, Daily before breakfast  . metoCLOPramide (REGLAN) 5 MG tablet TAKE ONE TABLET BY MOUTH THREE TIMES A DAY BEFORE MEALS  . pantoprazole (PROTONIX) 40 MG tablet TAKE ONE TABLET BY MOUTH DAILY  . temazepam (RESTORIL) 30 mg, Oral, Daily at bedtime  . TRIUMEQ [id number] MG tablet TAKE ONE  TABLET BY MOUTH DAILY    Review of Systems  Constitution: Negative for chills and fever.  HENT: Negative for hoarse voice and nosebleeds.   Eyes: Negative for discharge, double vision and pain.  Cardiovascular: Negative for chest pain, claudication, dyspnea on exertion, leg swelling, near-syncope, orthopnea, palpitations, paroxysmal nocturnal dyspnea and syncope.  Respiratory: Negative for hemoptysis and shortness of breath.   Musculoskeletal: Negative for muscle cramps and myalgias.       Fell prior to admission.   Gastrointestinal: Negative for abdominal pain, constipation, diarrhea, hematemesis, hematochezia, melena, nausea and vomiting.  Neurological: Negative for dizziness and light-headedness.   PHYSICAL EXAM: Vitals with BMI 04/15/2020 04/15/2020 04/15/2020  Height - - -  Weight - - -  BMI - - -  Systolic [id number]  Diastolic 90 88 82  Pulse 80 85 82    CONSTITUTIONAL: Frail gentleman, appears older than stated age, hemodynamically stable. No acute distress.  SKIN: Skin is warm and dry. No rash noted. No cyanosis. No pallor. No jaundice HEAD: Staples present over the left  lateral cranium. Dry blood noted.  EYES: No scleral icterus MOUTH/THROAT: Moist oral membranes. Poor oral dentition.  NECK: No JVD present. No thyromegaly noted. Bilateral carotid bruits  LYMPHATIC: No visible cervical adenopathy.  CHEST Normal respiratory effort. No intercostal retractions  LUNGS: Clear to auscultation bilaterally. No stridor. No wheezes. No rales.  CARDIOVASCULAR: Regular rate and rhythm, positive [id number], soft systolic ejection murmur at 2RICS, no rubs or gallops appreciated. ABDOMINAL: No apparent ascites.  EXTREMITIES: No peripheral edema  HEMATOLOGIC: No significant bruising NEUROLOGIC: Oriented to person, place, and time. Nonfocal. Normal muscle tone.  PSYCHIATRIC: Normal mood and affect. Normal behavior. Cooperative  RADIOLOGY: CT ANGIO HEAD W OR WO CONTRAST  Result Date:  04/14/2020 CLINICAL DATA:  Carotid artery stenosis. Recent head trauma. EXAM: CT ANGIOGRAPHY HEAD AND NECK TECHNIQUE: Multidetector CT imaging of the head and neck was performed using the standard protocol during bolus administration of intravenous contrast. Multiplanar CT image reconstructions and MIPs were obtained to evaluate the vascular anatomy. Carotid stenosis measurements (when applicable) are obtained utilizing NASCET criteria, using the distal internal carotid diameter as the denominator. CONTRAST:  14mL OMNIPAQUE IOHEXOL 350 MG/ML SOLN COMPARISON:  CT head without contrast 04/13/2020 FINDINGS: CT HEAD FINDINGS Brain: Mild generalized atrophy and white matter hypoattenuation is again noted bilaterally. Slight thickening of the posterior falx is stable, likely ossification. A 4 mm meningioma is present posteriorly. No extra-axial hemorrhage or fluid is present. The brainstem and cerebellum are within normal limits. No acute infarct, hemorrhage, or mass lesion is present. Vascular: Atherosclerotic calcifications are again seen within the cavernous internal carotid arteries bilaterally. No hyperdense vessel is present. Skull: Right temporoparietal scalp soft tissue swelling is present. Skin staples are in place. No underlying fracture is present. Calvarium is intact. Sinuses: Mild circumferential mucosal thickening is present right maxillary sinus. No fluid level or fracture is present. The paranasal sinuses and mastoid air cells are otherwise clear. Orbits: Bilateral lens replacements are noted. Globes and orbits are otherwise unremarkable. Review of the MIP images confirms the above findings CTA NECK FINDINGS Aortic arch: Atherosclerotic calcifications are present in the distal arch. No significant aneurysm is present. No significant stenosis is present at the great vessel origins. Right carotid system: Atherosclerotic irregularity is noted along the walls of proximal right common carotid artery without a  significant stenosis. A high-grade, near occlusive stenosis is present in the proximal left ICA. A tandem high-grade, near occlusive stenosis in the left ICA is noted 1.5 cm more distal. This results in asymmetric attenuation of the distal right ICA lumen. Left carotid system: The left common carotid artery demonstrates proximal tortuosity. Anterior atherosclerotic changes are present in the mid common carotid artery without a significant stenosis. Dense calcifications present about the left carotid bifurcation. The minimal luminal diameter of the proximal left ICA is 2.8 mm proximally. More distally scratched at 3 cm from the bifurcation is a high-grade, near occlusive stenosis. Non calcified plaque can be seen. More distal left ICA lumen is normal size. Vertebral arteries: Right vertebral artery is the dominant vessel. Atherosclerotic calcifications are present at the origins of both vessels. There is no significant stenosis on the right. Moderate proximal left vertebral artery stenosis is present. There is some irregularity in the left vertebral artery in the neck without a significant stenosis in the 3 year V4 segment. There is a moderate left vertebral artery stenosis at the dural margin just after the left PICA originates. No significant stenosis is present on the right. Skeleton: Posterior elements are  fused at C2-3. Asymmetric degenerative facet changes are present on the right at C3-4 and C4-5. Vertebral body heights are maintained. No focal lytic or blastic lesions are present. Other neck: Soft tissues of the neck are otherwise within normal limits. Upper chest: Patchy airspace opacity is present lung apices, right greater than left. Thoracic inlet is normal. Review of the MIP images confirms the above findings CTA HEAD FINDINGS Anterior circulation: Dense atherosclerotic changes are present within the cavernous internal carotid arteries. A high-grade stenosis is present at the paraophthalmic segment of  the right ICA. No significant stenosis is present the left. A high-grade stenosis is present in the proximal right A1 segment. Left A1 segment is normal. The anterior communicating artery is patent. M1 segments are normal. MCA bifurcations are intact. ACA and MCA branch vessels are unremarkable. Posterior circulation: Right vertebral artery is the dominant vessel. The left vertebral artery is hypoplastic above the dura. 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 Both posterior cerebral arteries originate from basilar tip. The PCA branch vessels are within normal limits. Moderate narrowing is present in the distal right P2 segment. Venous sinuses: Dural sinuses are patent. The straight sinus and deep cerebral veins are intact. Cortical veins are unremarkable. No vascular malformations are present. Anatomic variants: None Review of the MIP images confirms the above findings IMPRESSION: 1. 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 2. High-grade, near occlusive, stenosis of the left internal carotid artery 3 cm from the bifurcation. The distal left ICA is the more normal vessel. 3. Moderate proximal left vertebral artery stenosis. 4. Moderate left vertebral artery stenosis at the dural margin just after the left PICA originates. 5. 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 6. Moderate narrowing of the distal right P2 segment. 7. Mild generalized atrophy and white matter disease is stable. 8. 4 mm meningioma posteriorly. 9. No extra-axial hemorrhage. 10. Right temporoparietal scalp soft tissue swelling is improving. 11. Aortic Atherosclerosis (ICD10-I70.0). Electronically Signed   By: San Morelle M.D.   On: 04/14/2020 12:04   CT ANGIO NECK W OR WO CONTRAST  Result Date: 04/14/2020 CLINICAL DATA:  Carotid artery stenosis. Recent head trauma. EXAM: CT ANGIOGRAPHY HEAD AND NECK TECHNIQUE: Multidetector CT imaging of the head and neck  was performed using the standard protocol during bolus administration of intravenous contrast. Multiplanar CT image reconstructions and MIPs were obtained to evaluate the vascular anatomy. Carotid stenosis measurements (when applicable) are obtained utilizing NASCET criteria, using the distal internal carotid diameter as the denominator. CONTRAST:  1105mL OMNIPAQUE IOHEXOL 350 MG/ML SOLN COMPARISON:  CT head without contrast 04/13/2020 FINDINGS: CT HEAD FINDINGS Brain: Mild generalized atrophy and white matter hypoattenuation is again noted bilaterally. Slight thickening of the posterior falx is stable, likely ossification. A 4 mm meningioma is present posteriorly. No extra-axial hemorrhage or fluid is present. The brainstem and cerebellum are within normal limits. No acute infarct, hemorrhage, or mass lesion is present. Vascular: Atherosclerotic calcifications are again seen within the cavernous internal carotid arteries bilaterally. No hyperdense vessel is present. Skull: Right temporoparietal scalp soft tissue swelling is present. Skin staples are in place. No underlying fracture is present. Calvarium is intact. Sinuses: Mild circumferential mucosal thickening is present right maxillary sinus. No fluid level or fracture is present. The paranasal sinuses and mastoid air cells are otherwise clear. Orbits: Bilateral lens replacements are noted. Globes and orbits are otherwise unremarkable. Review of the MIP images confirms the above  findings CTA NECK FINDINGS Aortic arch: Atherosclerotic calcifications are present in the distal arch. No significant aneurysm is present. No significant stenosis is present at the great vessel origins. Right carotid system: Atherosclerotic irregularity is noted along the walls of proximal right common carotid artery without a significant stenosis. A high-grade, near occlusive stenosis is present in the proximal left ICA. A tandem high-grade, near occlusive stenosis in the left ICA is  noted 1.5 cm more distal. This results in asymmetric attenuation of the distal right ICA lumen. Left carotid system: The left common carotid artery demonstrates proximal tortuosity. Anterior atherosclerotic changes are present in the mid common carotid artery without a significant stenosis. Dense calcifications present about the left carotid bifurcation. The minimal luminal diameter of the proximal left ICA is 2.8 mm proximally. More distally scratched at 3 cm from the bifurcation is a high-grade, near occlusive stenosis. Non calcified plaque can be seen. More distal left ICA lumen is normal size. Vertebral arteries: Right vertebral artery is the dominant vessel. Atherosclerotic calcifications are present at the origins of both vessels. There is no significant stenosis on the right. Moderate proximal left vertebral artery stenosis is present. There is some irregularity in the left vertebral artery in the neck without a significant stenosis in the 3 year V4 segment. There is a moderate left vertebral artery stenosis at the dural margin just after the left PICA originates. No significant stenosis is present on the right. Skeleton: Posterior elements are fused at C2-3. Asymmetric degenerative facet changes are present on the right at C3-4 and C4-5. Vertebral body heights are maintained. No focal lytic or blastic lesions are present. Other neck: Soft tissues of the neck are otherwise within normal limits. Upper chest: Patchy airspace opacity is present lung apices, right greater than left. Thoracic inlet is normal. Review of the MIP images confirms the above findings CTA HEAD FINDINGS Anterior circulation: Dense atherosclerotic changes are present within the cavernous internal carotid arteries. A high-grade stenosis is present at the paraophthalmic segment of the right ICA. No significant stenosis is present the left. A high-grade stenosis is present in the proximal right A1 segment. Left A1 segment is normal. The  anterior communicating artery is patent. M1 segments are normal. MCA bifurcations are intact. ACA and MCA branch vessels are unremarkable. Posterior circulation: Right vertebral artery is the dominant vessel. The left vertebral artery is hypoplastic above the dura. 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 Both posterior cerebral arteries originate from basilar tip. The PCA branch vessels are within normal limits. Moderate narrowing is present in the distal right P2 segment. Venous sinuses: Dural sinuses are patent. The straight sinus and deep cerebral veins are intact. Cortical veins are unremarkable. No vascular malformations are present. Anatomic variants: None Review of the MIP images confirms the above findings IMPRESSION: 1. 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 2. High-grade, near occlusive, stenosis of the left internal carotid artery 3 cm from the bifurcation. The distal left ICA is the more normal vessel. 3. Moderate proximal left vertebral artery stenosis. 4. Moderate left vertebral artery stenosis at the dural margin just after the left PICA originates. 5. 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 6. Moderate narrowing of the distal right P2 segment. 7. Mild generalized atrophy and white matter disease is stable. 8. 4 mm meningioma posteriorly. 9. No extra-axial hemorrhage. 10. Right temporoparietal scalp soft tissue swelling is improving. 11. Aortic Atherosclerosis (ICD10-I70.0). Electronically Signed  By: San Morelle M.D.   On: 04/14/2020 12:04    LABORATORY DATA: Lab Results  Component Value Date   WBC 3.7 (L) 04/15/2020   HGB 10.9 (L) 04/15/2020   HCT 31.8 (L) 04/15/2020   MCV 98.1 04/15/2020   PLT 149 (L) 04/15/2020    Recent Labs  Lab 04/13/20 0320 04/13/20 0334 04/15/20 0232  NA  --    < > 135  K  --    < > 3.2*  CL  --    < > 100  CO2  --    < > 26  BUN  --    < > 10  CREATININE  --     < > 1.04  CALCIUM  --    < > 9.2  PROT 7.9  --   --   BILITOT 2.0*  --   --   ALKPHOS 68  --   --   ALT 25  --   --   AST 27  --   --   GLUCOSE  --    < > 251*   < > = values in this interval not displayed.    Lipid Panel     Component Value Date/Time   CHOL 185 09/20/2019 1116   TRIG 386 (H) 09/20/2019 1116   HDL 29 (L) 09/20/2019 1116   CHOLHDL 6.4 (H) 09/20/2019 1116   VLDL NOT CALC 01/27/2016 1036   LDLCALC 105 (H) 09/20/2019 1116    BNP (last 3 results) No results for input(s): BNP in the last 8760 hours.  HEMOGLOBIN A1C Lab Results  Component Value Date   HGBA1C 12.9 (H) 04/13/2020   MPG 324 04/13/2020    Cardiac Panel (last 3 results) No results for input(s): CKTOTAL, CKMB, RELINDX in the last 8760 hours.  Invalid input(s): TROPONINHS  No results found for: CKTOTAL, CKMB, CKMBINDEX   TSH No results for input(s): TSH in the last 8760 hours.   Scheduled Meds: . abacavir-dolutegravir-lamiVUDine  1 tablet Oral Daily  . aspirin EC  81 mg Oral Daily  . clopidogrel  75 mg Oral Daily  . feeding supplement (GLUCERNA SHAKE)  237 mL Oral TID BM  . FLUoxetine  20 mg Oral Daily  . [START ON [id number] folic acid  2 mg Oral Daily  . insulin aspart  0-5 Units Subcutaneous QHS  . insulin aspart  0-9 Units Subcutaneous TID WC  . insulin aspart  6 Units Subcutaneous TID WC  . [START ON 04/16/2020] insulin glargine  22 Units Subcutaneous Daily  . levothyroxine  50 mcg Oral QAC breakfast  . rosuvastatin  10 mg Oral Daily  . temazepam  30 mg Oral QHS  . vitamin B-12  100 mcg Oral Daily   Continuous Infusions: . sodium chloride 100 mL/hr at 04/13/20 1932  . sodium chloride 10 mL/hr at 04/15/20 1720   PRN Meds:.acetaminophen **OR** acetaminophen, albuterol, clonazePAM, dextrose  CARDIAC DATABASE: EKG: Ordered today, results pending.   Echocardiogram: None  Stress Testing:  None  Heart Catheterization: None  Carotid duplex: 04/13/2020: Right Carotid:  Velocities in the right ICA are consistent with a 80-99% stenosis.  Left Carotid: Velocities in the left ICA are consistent with a 1-39% stenosis. Calcific plaque could obscure higher velocities.  Vertebrals: Bilateral vertebral arteries demonstrate antegrade flow.  Subclavians: Left subclavian artery was not visualized. Normal flow hemodynamics were seen in the right subclavian artery. Clavicle.   IMPRESSION & RECOMMENDATIONS: ADRAN EKLUND is a 69 y.o. male whose  past medical history and cardiovascular risk factors include: Newly diagnosed diabetes mellitus type 2 anxiety, GERD, HIV, hypertension, hyperlipidemia, neuromuscular disorder, hypothyroidism, history of tonsillar cancer, history of TB, advanced age.  Preprocedural risk stratification.  EKG is ordered, results pending.  Telemetry shows normal sinus rhythm.  Patient does have a cardiac murmur and therefore would recommend an echocardiogram to evaluate for structural heart disease and LV function.  Given his newly diagnosis of diabetes mellitus type 2 and bilateral carotid artery stenosis would recommend stress test prior to upcoming procedure as his functional status is limited.  No anginal symptoms.  Clinically not in heart failure.  Recommend a fasting lipid profile.  We will initiate Toprol-XL 25 mg p.o. daily as this procedure is at least 2 weeks out.  Patient will be scheduled for both echo and stress test as outpatient and I will see him in office prior to his appointment with Dr. Oneida Alar on April 30, 2020.  Patient was agreeable with the plan of care.  His questions and concerns were addressed to his satisfaction.  We will have the office coordinate diagnostic testing and office visit prior to April 30 2020  Bilateral carotid artery stenosis:  Tentatively scheduled for carotid artery stenting on May 04, 2020 with Dr. Oneida Alar.  Continue aspirin and Plavix.  Continue rosuvastatin.  Check fasting lipid  profile  Other: Status post mechanical fall with scalp laceration.  Currently managed by primary team. New diagnosis of diabetes mellitus type 2, hemoglobin A1c 12.9: Currently managed by primary team. Benign essential hypertension: Managed by primary team. Hyperlipidemia: Continue statin therapy and will check fasting lipid profile. Hypothyroidism. History of tonsillar cancer. History of TB History of syphilis. HIV.  Currently on her retroviral therapy  Patient's questions and concerns were addressed to his satisfaction. He voices understanding of the instructions provided during this encounter.   This note was created using a voice recognition software as a result there may be grammatical errors inadvertently enclosed that do not reflect the nature of this encounter. Every attempt is made to correct such errors.  Mechele Claude Ssm [facility]  Pager: [phone] Office: [phone] 04/15/2020, 5:49 PM

## 2020-04-15 NOTE — Progress Notes (Signed)
Occupational Therapy Treatment Patient Details Name: Christopher [name] MRN: [id number] DOB: [birth year] Today's Date: 04/15/2020    History of present illness Christopher [name] is a 69 y.o. male with medical history significant of anxiety, CKD 3, GERD, HIV, benign familial essential tremors, hypertension, hyperlipidemia, neuropathy, neuromuscular disorder, hypothyroidism, history of tonsillar cancer, history of TB presenting for evaluation of head injury after a mechanical fall. Found to have HHS/new onset diabetes and carotid artery stenosis. Head lac stapled in the ED.  CT scan abnormal, but thought to be calcification, not active bleed.    OT comments  Pt progressing well toward stated goals, focused session on extensive DME education and fall prevention in home environment. Pt spouse present for education as well. Reviewed various DME options for bathing, bed mobility, and functional mobility around the home. Pt and spouse both reviewed and demonstrated fall prevention strategies. Pt completing sit <> stands with min A with use of straight cane this session. He continues to require min A steadying support with functional mobility, which spouse can provide. Recommended idea of [place] for DME and home safety evaluation, pt and spouse may still politely decline. Will continue to follow.   Follow Up Recommendations  Home health OT(discussed option of HHOT for DME evaluation in the home, pt and spouse may still refuse)    Equipment Recommendations  Tub/shower seat;Toilet riser;Other (comment)(stool with handle by bed)    Recommendations for Other Services      Precautions / Restrictions Precautions Precautions: Fall Precaution Comments: h/o falls due to significant peripheral neuropathy PTA Restrictions Weight Bearing Restrictions: No       Mobility Bed Mobility Overal bed mobility: Modified Independent             General bed mobility comments: finishing up in hallway with PT on OT  arrival  Transfers Overall transfer level: Needs assistance Equipment used: 1 person hand held assist;Straight cane Transfers: Sit to/from Stand Sit to Stand: Min assist         General transfer comment: Heavy min assist to stabilize for balance, Pt's husband, Christopher [name], reports this was difficult for him PTA (the transition of coming to standing and stabilizing, he was better once he was up and moving).      Balance Overall balance assessment: Needs assistance Sitting-balance support: Feet supported;No upper extremity supported Sitting balance-Leahy Scale: Good     Standing balance support: Bilateral upper extremity supported;Single extremity supported Standing balance-Leahy Scale: Poor Standing balance comment: reliant on external support with dynamic tasks                           ADL either performed or assessed with clinical judgement   ADL Overall ADL's : Needs assistance/impaired                     Lower Body Dressing: Minimal assistance;Sit to/from stand   Toilet Transfer: Minimal assistance;Stand-pivot             General ADL Comments: Extensive education given on DME options for home environment. Both pt and his husband were educated on shower chair, stool for tall bed, bed rail for bed, raised toilets, and hand held shower head. Educated pt and husband as to where these items can be acquired as well as renting them as able from DME supply companies. Encouraged pt and husband to practice transfers with/without DME to decide if it is needed. Reviewed further fall prevention strategies in session  as well     Vision Patient Visual Report: No change from baseline     Perception     Praxis      Cognition Arousal/Alertness: Awake/alert Behavior During Therapy: WFL for tasks assessed/performed Overall Cognitive Status: Within Functional Limits for tasks assessed                                          Exercises     Shoulder  Instructions       General Comments     Pertinent Vitals/ Pain       Pain Assessment: No/denies pain  Home Living                                          Prior Functioning/Environment              Frequency  Min 2X/week        Progress Toward Goals  OT Goals(current goals can now be found in the care plan section)  Progress towards OT goals: Progressing toward goals  Acute Rehab OT Goals Patient Stated Goal: to go home OT Goal Formulation: With patient Time For Goal Achievement: 04/27/20 Potential to Achieve Goals: Good  Plan Discharge plan needs to be updated    Co-evaluation    PT/OT/SLP Co-Evaluation/Treatment: Yes Reason for Co-Treatment: For patient/therapist safety PT goals addressed during session: Mobility/safety with mobility;Balance;Proper use of DME OT goals addressed during session: ADL's and self-care;Proper use of Adaptive equipment and DME      AM-PAC OT "6 Clicks" Daily Activity     Outcome Measure   Help from another person eating meals?: None Help from another person taking care of personal grooming?: A Little Help from another person toileting, which includes using toliet, bedpan, or urinal?: A Little Help from another person bathing (including washing, rinsing, drying)?: A Little Help from another person to put on and taking off regular upper body clothing?: A Little Help from another person to put on and taking off regular lower body clothing?: A Little 6 Click Score: 19    End of Session    OT Visit Diagnosis: Unsteadiness on feet (R26.81);Other abnormalities of gait and mobility (R26.89);Muscle weakness (generalized) (M62.81)   Activity Tolerance Patient tolerated treatment well   Patient Left in chair;with call bell/phone within reach;with family/visitor present   Nurse Communication Mobility status        Time: 1210-1226 OT Time Calculation (min): 16 min  Charges: OT General Charges $OT Visit: 1  Visit OT Treatments $Self Care/Home Management : 8-22 mins  Zenovia Jarred, MSOT, OTR/L [facility] Office Number: [phone] Pager: [place] 04/15/2020, 5:18 PM

## 2020-04-15 NOTE — Progress Notes (Addendum)
PROGRESS NOTE        PATIENT DETAILS Name: Christopher [name] Age: 68 y.o. Sex: male Date of Birth: March 18, 1951 Admit Date: 04/13/2020 Admitting Physician Shela Leff, MD [id number], Jori Moll, MD  Brief Narrative: Patient is a 69 y.o. male with history of HIV, HLD, GERD, hypothyroidism peripheral neuropathy-walks with a walker at home-presented with 1 month history of polyuria polydipsia-and a mechanical fall on the day of admission-he was found to have uncontrolled diabetes with hyperglycemia complicated by hyperosmolar state-started on insulin infusion-and subsequently admitted to the hospitalist service.  See below for further details.    Significant events: 5/24>> admit to [facility] for HSS/mechanical fall.  Significant studies: 5/24>> chest x-ray: Mild bronchitis changes 5/24>> CT head:Trace amount of right hyperdense parafalcine thickening which may be calcification.Consider short-term (6-hour) follow-up 5/24>> CT C-spine: No evidence of acute fracture or traumatic listhesis of the cervical spine. 5/24>> CT head: No change in the anterior parafalcine region compared to earlier-suspect mild calcification in this area.  Sitter short(6 hour) interval follow-up to assess for stability or resolution 5/24>> carotid ultrasound: right ICA 80-99% stenosis 5/25 >>CTA head and neck: High-grade near occlusive stenosis of the right ICA at the bifurcation, high-grade near occlusive stenosis of the left ICA 3 cm from the bifurcation, moderate proximal left vertebral artery stenosis.  Antimicrobial therapy: None  Microbiology data: None  Procedures : None  Consults: Vascular surgery Cardiology Einar Gip)  DVT Prophylaxis : SCD's  Subjective: No chest pain or shortness of breath-lying comfortably in bed.  Assessment/Plan: New onset DM-2 with uncontrolled hyperglycemia (A1c 0000000 on [id number]) complicated by hyperosmolar state: Initially required IV insulin infusion-CBGs  still uncontrolled-increase Lantus to 22 units, increase Premeal NovoLog to 6 units-continue SSI.  Mechanical fall-with scalp laceration: CT head negative x2 for acute abnormalities (right hyperdense parafalcine thickening likely a calcification).  Continue local wound care-supportive care.  Suspect mechanical fall could have been from orthostatic hypotension physiology in the setting of severe hyperglycemia.  PT eval completed-recommendations are for home health.  AKI on CKD stage IIIa: AKI hemodynamically mediated in the setting of uncontrolled hyperglycemia.  Right ICA stenosis: Found incidentally during work-up for mechanical fall-do not think ICA stenosis was the etiology for his mechanical fall.  Appreciate vascular surgery follow-up-Per vascular surgery-patient needs inpatient cardiac arrest assessment-tentative plans are for carotid artery stent placement bilaterally on 05/04/2020.  Await preoperative risk evaluation by cardiology-consulted 5/26.  Continue aspirin/Plavix/statin.  Pseudohyponatremia in the setting of uncontrolled hyperglycemia: Resolved.  Hypokalemia: Replete and recheck.  Macrocytic anemia: Probably secondary to HIV/antiretrovirals-however has folic acid deficiency-on supplementation.  Follow CBC periodically.  HIV: Continue antiretrovirals.  Hypothyroidism: Continue Synthroid  Minimally elevated bilirubin level: Unknown clinical significance-may be Gilbert's syndrome-RUQ ultrasound negative for acute abnormalities-showed cholelithiasis-this is asymptomatic and does not warrant inpatient evaluation.  Repeat LFTs tomorrow morning.   Nutrition Problem: Nutrition Problem: Moderate Malnutrition Etiology: chronic illness(HIV) Signs/Symptoms: moderate fat depletion, moderate muscle depletion Interventions: Glucerna shake  Obesity: Estimated body mass index is 23.39 kg/m as calculated from the following:   Height as of 01/31/19: 5\' 10"  (1.778 m).   Weight as of  10/07/19: 73.9 kg.   Diet: Diet Order            Diet heart healthy/carb modified Room service appropriate? Yes with Assist; Fluid consistency: Thin  Diet effective now  Code Status: Full code  Family Communication: Spouse at bedside  Disposition Plan: Status is: Inpatient  Remains inpatient appropriate because:Inpatient level of care appropriate due to severity of illness   Dispo: The patient is from: Home              Anticipated d/c is to: Home              Anticipated d/c date is: 1 day              Patient currently is not medically stable to d/c.  Barriers to Discharge: Optimize glycemic control-await preoperative cardiology evaluation  Antimicrobial agents: Anti-infectives (From admission, onward)   Start     Dose/Rate Route Frequency Ordered Stop   04/13/20 1500  abacavir-dolutegravir-lamiVUDine (TRIUMEQ) 600-50-300 MG per tablet 1 tablet     1 tablet Oral Daily 04/13/20 1421         Time spent: 25 minutes-Greater than 50% of this time was spent in counseling, explanation of diagnosis, planning of further management, and coordination of care.  MEDICATIONS: Scheduled Meds: . abacavir-dolutegravir-lamiVUDine  1 tablet Oral Daily  . aspirin EC  81 mg Oral Daily  . clopidogrel  75 mg Oral Daily  . feeding supplement (GLUCERNA SHAKE)  237 mL Oral TID BM  . FLUoxetine  20 mg Oral Daily  . folic acid  1 mg Oral Daily  . insulin aspart  0-5 Units Subcutaneous QHS  . insulin aspart  0-9 Units Subcutaneous TID WC  . insulin aspart  6 Units Subcutaneous TID WC  . [START ON 04/16/2020] insulin glargine  22 Units Subcutaneous Daily  . levothyroxine  50 mcg Oral QAC breakfast  . rosuvastatin  10 mg Oral Daily  . temazepam  30 mg Oral QHS  . vitamin B-12  100 mcg Oral Daily   Continuous Infusions: . sodium chloride 100 mL/hr at 04/13/20 1932  . sodium chloride 50 mL/hr at 04/13/20 2103   PRN Meds:.acetaminophen **OR** acetaminophen, albuterol,  clonazePAM, dextrose   PHYSICAL EXAM: Vital signs: Vitals:   04/14/20 2000 04/15/20 0000 04/15/20 0400 04/15/20 0800  BP: 138/76 130/89 135/82 (!) 149/88  Pulse: 79 79 82 85  Resp: 13 16 16 14   Temp: 98.2 F (36.8 C) 97.7 F (36.5 C) 98.3 F (36.8 C) 98.3 F (36.8 C)  TempSrc: Oral Oral Axillary Oral  SpO2: 96% 91% 94% 95%   There were no vitals filed for this visit. There is no height or weight on file to calculate BMI.   Gen Exam:Alert awake-not in any distress HEENT:atraumatic, normocephalic Chest: B/L clear to auscultation anteriorly CVS:S1S2 regular Abdomen:soft non tender, non distended Extremities:no edema Neurology: Non focal Skin: no rash  I have personally reviewed following labs and imaging studies  LABORATORY DATA: CBC: Recent Labs  Lab 04/13/20 0100 04/13/20 0334 04/15/20 0232  WBC 9.5  --  3.7*  NEUTROABS 8.3*  --   --   HGB 13.1 13.6 10.9*  HCT 43.4 40.0 31.8*  MCV 110.7*  --  98.1  PLT 209  --  149*    Basic Metabolic Panel: Recent Labs  Lab 04/13/20 0100 04/13/20 0100 04/13/20 0320 04/13/20 0334 04/13/20 0648 04/13/20 1414 04/14/20 0325 04/15/20 0232  NA 119*   < >  --  123* 132* 138 135 135  K 4.7   < >  --  5.2* 3.6 3.6 3.7 3.2*  CL 78*  --   --   --  96* 102 101 100  CO2 22  --   --   --  23 27 27 26   GLUCOSE 1,400*  --   --   --  894* 289* 290* 251*  BUN 17  --   --   --  16 14 12 10   CREATININE 1.93*  --   --   --  1.70* 1.30* 1.11 1.04  CALCIUM 10.3  --   --   --  9.8 9.3 8.8* 9.2  MG  --   --  1.8  --   --   --   --   --   PHOS  --   --  3.3  --   --   --   --   --    < > = values in this interval not displayed.    GFR: CrCl cannot be calculated (Unknown ideal weight.).  Liver Function Tests: Recent Labs  Lab 04/13/20 0320  AST 27  ALT 25  ALKPHOS 68  BILITOT 2.0*  PROT 7.9  ALBUMIN 3.8   No results for input(s): LIPASE, AMYLASE in the last 168 hours. No results for input(s): AMMONIA in the last 168  hours.  Coagulation Profile: No results for input(s): INR, PROTIME in the last 168 hours.  Cardiac Enzymes: No results for input(s): CKTOTAL, CKMB, CKMBINDEX, TROPONINI in the last 168 hours.  BNP (last 3 results) No results for input(s): PROBNP in the last 8760 hours.  Lipid Profile: No results for input(s): CHOL, HDL, LDLCALC, TRIG, CHOLHDL, LDLDIRECT in the last 72 hours.  Thyroid Function Tests: No results for input(s): TSH, T4TOTAL, FREET4, T3FREE, THYROIDAB in the last 72 hours.  Anemia Panel: Recent Labs    04/13/20 0648  VITAMINB12 381  FOLATE 2.6*    Urine analysis:    Component Value Date/Time   COLORURINE STRAW (A) 04/13/2020 0345   APPEARANCEUR CLEAR 04/13/2020 0345   LABSPEC 1.025 04/13/2020 0345   PHURINE 7.0 04/13/2020 0345   GLUCOSEU >=500 (A) 04/13/2020 0345   HGBUR NEGATIVE 04/13/2020 0345   BILIRUBINUR NEGATIVE 04/13/2020 0345   KETONESUR NEGATIVE 04/13/2020 0345   PROTEINUR NEGATIVE 04/13/2020 0345   NITRITE NEGATIVE 04/13/2020 0345   LEUKOCYTESUR NEGATIVE 04/13/2020 0345    Sepsis Labs: Lactic Acid, Venous No results found for: LATICACIDVEN  MICROBIOLOGY: Recent Results (from the past 240 hour(s))  SARS Coronavirus 2 by RT PCR (hospital order, performed in [place] hospital lab) Nasopharyngeal Nasopharyngeal Swab     Status: None   Collection Time: 04/13/20  4:09 AM   Specimen: Nasopharyngeal Swab  Result Value Ref Range Status   SARS Coronavirus 2 NEGATIVE NEGATIVE Final    Comment: (NOTE) SARS-CoV-2 target nucleic acids are NOT DETECTED. The SARS-CoV-2 RNA is generally detectable in upper and lower respiratory specimens during the acute phase of infection. The lowest concentration of SARS-CoV-2 viral copies this assay can detect is 250 copies / mL. A negative result does not preclude SARS-CoV-2 infection and should not be used as the sole basis for treatment or other patient management decisions.  A negative result may occur  with improper specimen collection / handling, submission of specimen other than nasopharyngeal swab, presence of viral mutation(s) within the areas targeted by this assay, and inadequate number of viral copies (<250 copies / mL). A negative result must be combined with clinical observations, patient history, and epidemiological information. Fact Sheet for Patients:   StrictlyIdeas.no Fact Sheet for Healthcare Providers: BankingDealers.co.za This test is not yet approved or cleared  by the [place] FDA and has been authorized for detection and/or diagnosis of  SARS-CoV-2 by FDA under an Emergency Use Authorization (EUA).  This EUA will remain in effect (meaning this test can be used) for the duration of the COVID-19 declaration under Section 564(b)(1) of the Act, 21 U.S.C. section 360bbb-3(b)(1), unless the authorization is terminated or revoked sooner. Performed at [facility], [place] [address], [place], [place] [address]   MRSA PCR Screening     Status: None   Collection Time: 04/13/20  6:49 PM   Specimen: Nasal Mucosa; Nasopharyngeal  Result Value Ref Range Status   MRSA by PCR NEGATIVE NEGATIVE Final    Comment:        The GeneXpert MRSA Assay (FDA approved for NASAL specimens only), is one component of a comprehensive MRSA colonization surveillance program. It is not intended to diagnose MRSA infection nor to guide or monitor treatment for MRSA infections. Performed at [facility], [place] [address], [place],  [address]     RADIOLOGY STUDIES/RESULTS: CT ANGIO HEAD W OR WO CONTRAST  Result Date: 04/14/2020 CLINICAL DATA:  Carotid artery stenosis. Recent head trauma. EXAM: CT ANGIOGRAPHY HEAD AND NECK TECHNIQUE: Multidetector CT imaging of the head and neck was performed using the standard protocol during bolus administration of intravenous contrast. Multiplanar CT image reconstructions and MIPs were  obtained to evaluate the vascular anatomy. Carotid stenosis measurements (when applicable) are obtained utilizing NASCET criteria, using the distal internal carotid diameter as the denominator. CONTRAST:  168mL OMNIPAQUE IOHEXOL 350 MG/ML SOLN COMPARISON:  CT head without contrast 04/13/2020 FINDINGS: CT HEAD FINDINGS Brain: Mild generalized atrophy and white matter hypoattenuation is again noted bilaterally. Slight thickening of the posterior falx is stable, likely ossification. A 4 mm meningioma is present posteriorly. No extra-axial hemorrhage or fluid is present. The brainstem and cerebellum are within normal limits. No acute infarct, hemorrhage, or mass lesion is present. Vascular: Atherosclerotic calcifications are again seen within the cavernous internal carotid arteries bilaterally. No hyperdense vessel is present. Skull: Right temporoparietal scalp soft tissue swelling is present. Skin staples are in place. No underlying fracture is present. Calvarium is intact. Sinuses: Mild circumferential mucosal thickening is present right maxillary sinus. No fluid level or fracture is present. The paranasal sinuses and mastoid air cells are otherwise clear. Orbits: Bilateral lens replacements are noted. Globes and orbits are otherwise unremarkable. Review of the MIP images confirms the above findings CTA NECK FINDINGS Aortic arch: Atherosclerotic calcifications are present in the distal arch. No significant aneurysm is present. No significant stenosis is present at the great vessel origins. Right carotid system: Atherosclerotic irregularity is noted along the walls of proximal right common carotid artery without a significant stenosis. A high-grade, near occlusive stenosis is present in the proximal left ICA. A tandem high-grade, near occlusive stenosis in the left ICA is noted 1.5 cm more distal. This results in asymmetric attenuation of the distal right ICA lumen. Left carotid system: The left common carotid artery  demonstrates proximal tortuosity. Anterior atherosclerotic changes are present in the mid common carotid artery without a significant stenosis. Dense calcifications present about the left carotid bifurcation. The minimal luminal diameter of the proximal left ICA is 2.8 mm proximally. More distally scratched at 3 cm from the bifurcation is a high-grade, near occlusive stenosis. Non calcified plaque can be seen. More distal left ICA lumen is normal size. Vertebral arteries: Right vertebral artery is the dominant vessel. Atherosclerotic calcifications are present at the origins of both vessels. There is no significant stenosis on the right. Moderate proximal left  vertebral artery stenosis is present. There is some irregularity in the left vertebral artery in the neck without a significant stenosis in the 3 year V4 segment. There is a moderate left vertebral artery stenosis at the dural margin just after the left PICA originates. No significant stenosis is present on the right. Skeleton: Posterior elements are fused at C2-3. Asymmetric degenerative facet changes are present on the right at C3-4 and C4-5. Vertebral body heights are maintained. No focal lytic or blastic lesions are present. Other neck: Soft tissues of the neck are otherwise within normal limits. Upper chest: Patchy airspace opacity is present lung apices, right greater than left. Thoracic inlet is normal. Review of the MIP images confirms the above findings CTA HEAD FINDINGS Anterior circulation: Dense atherosclerotic changes are present within the cavernous internal carotid arteries. A high-grade stenosis is present at the paraophthalmic segment of the right ICA. No significant stenosis is present the left. A high-grade stenosis is present in the proximal right A1 segment. Left A1 segment is normal. The anterior communicating artery is patent. M1 segments are normal. MCA bifurcations are intact. ACA and MCA branch vessels are unremarkable. Posterior  circulation: Right vertebral artery is the dominant vessel. The left vertebral artery is hypoplastic above the dura. 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 Both posterior cerebral arteries originate from basilar tip. The PCA branch vessels are within normal limits. Moderate narrowing is present in the distal right P2 segment. Venous sinuses: Dural sinuses are patent. The straight sinus and deep cerebral veins are intact. Cortical veins are unremarkable. No vascular malformations are present. Anatomic variants: None Review of the MIP images confirms the above findings IMPRESSION: 1. 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 2. High-grade, near occlusive, stenosis of the left internal carotid artery 3 cm from the bifurcation. The distal left ICA is the more normal vessel. 3. Moderate proximal left vertebral artery stenosis. 4. Moderate left vertebral artery stenosis at the dural margin just after the left PICA originates. 5. 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 6. Moderate narrowing of the distal right P2 segment. 7. Mild generalized atrophy and white matter disease is stable. 8. 4 mm meningioma posteriorly. 9. No extra-axial hemorrhage. 10. Right temporoparietal scalp soft tissue swelling is improving. 11. Aortic Atherosclerosis (ICD10-I70.0). Electronically Signed   By: San Morelle M.D.   On: 04/14/2020 12:04   CT ANGIO NECK W OR WO CONTRAST  Result Date: 04/14/2020 CLINICAL DATA:  Carotid artery stenosis. Recent head trauma. EXAM: CT ANGIOGRAPHY HEAD AND NECK TECHNIQUE: Multidetector CT imaging of the head and neck was performed using the standard protocol during bolus administration of intravenous contrast. Multiplanar CT image reconstructions and MIPs were obtained to evaluate the vascular anatomy. Carotid stenosis measurements (when applicable) are obtained utilizing NASCET criteria, using the distal internal  carotid diameter as the denominator. CONTRAST:  172mL OMNIPAQUE IOHEXOL 350 MG/ML SOLN COMPARISON:  CT head without contrast 04/13/2020 FINDINGS: CT HEAD FINDINGS Brain: Mild generalized atrophy and white matter hypoattenuation is again noted bilaterally. Slight thickening of the posterior falx is stable, likely ossification. A 4 mm meningioma is present posteriorly. No extra-axial hemorrhage or fluid is present. The brainstem and cerebellum are within normal limits. No acute infarct, hemorrhage, or mass lesion is present. Vascular: Atherosclerotic calcifications are again seen within the cavernous internal carotid arteries bilaterally. No hyperdense vessel is present. Skull: Right temporoparietal scalp soft tissue swelling is present. Skin staples are  in place. No underlying fracture is present. Calvarium is intact. Sinuses: Mild circumferential mucosal thickening is present right maxillary sinus. No fluid level or fracture is present. The paranasal sinuses and mastoid air cells are otherwise clear. Orbits: Bilateral lens replacements are noted. Globes and orbits are otherwise unremarkable. Review of the MIP images confirms the above findings CTA NECK FINDINGS Aortic arch: Atherosclerotic calcifications are present in the distal arch. No significant aneurysm is present. No significant stenosis is present at the great vessel origins. Right carotid system: Atherosclerotic irregularity is noted along the walls of proximal right common carotid artery without a significant stenosis. A high-grade, near occlusive stenosis is present in the proximal left ICA. A tandem high-grade, near occlusive stenosis in the left ICA is noted 1.5 cm more distal. This results in asymmetric attenuation of the distal right ICA lumen. Left carotid system: The left common carotid artery demonstrates proximal tortuosity. Anterior atherosclerotic changes are present in the mid common carotid artery without a significant stenosis. Dense  calcifications present about the left carotid bifurcation. The minimal luminal diameter of the proximal left ICA is 2.8 mm proximally. More distally scratched at 3 cm from the bifurcation is a high-grade, near occlusive stenosis. Non calcified plaque can be seen. More distal left ICA lumen is normal size. Vertebral arteries: Right vertebral artery is the dominant vessel. Atherosclerotic calcifications are present at the origins of both vessels. There is no significant stenosis on the right. Moderate proximal left vertebral artery stenosis is present. There is some irregularity in the left vertebral artery in the neck without a significant stenosis in the 3 year V4 segment. There is a moderate left vertebral artery stenosis at the dural margin just after the left PICA originates. No significant stenosis is present on the right. Skeleton: Posterior elements are fused at C2-3. Asymmetric degenerative facet changes are present on the right at C3-4 and C4-5. Vertebral body heights are maintained. No focal lytic or blastic lesions are present. Other neck: Soft tissues of the neck are otherwise within normal limits. Upper chest: Patchy airspace opacity is present lung apices, right greater than left. Thoracic inlet is normal. Review of the MIP images confirms the above findings CTA HEAD FINDINGS Anterior circulation: Dense atherosclerotic changes are present within the cavernous internal carotid arteries. A high-grade stenosis is present at the paraophthalmic segment of the right ICA. No significant stenosis is present the left. A high-grade stenosis is present in the proximal right A1 segment. Left A1 segment is normal. The anterior communicating artery is patent. M1 segments are normal. MCA bifurcations are intact. ACA and MCA branch vessels are unremarkable. Posterior circulation: Right vertebral artery is the dominant vessel. The left vertebral artery is hypoplastic above the dura. 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  Both posterior cerebral arteries originate from basilar tip. The PCA branch vessels are within normal limits. Moderate narrowing is present in the distal right P2 segment. Venous sinuses: Dural sinuses are patent. The straight sinus and deep cerebral veins are intact. Cortical veins are unremarkable. No vascular malformations are present. Anatomic variants: None Review of the MIP images confirms the above findings IMPRESSION: 1. 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 2. High-grade, near occlusive, stenosis of the left internal carotid artery 3 cm from the bifurcation. The distal left ICA is the more normal vessel. 3. Moderate proximal left vertebral artery stenosis. 4. Moderate left vertebral artery stenosis at the dural margin  just after the left PICA originates. 5. 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 6. Moderate narrowing of the distal right P2 segment. 7. Mild generalized atrophy and white matter disease is stable. 8. 4 mm meningioma posteriorly. 9. No extra-axial hemorrhage. 10. Right temporoparietal scalp soft tissue swelling is improving. 11. Aortic Atherosclerosis (ICD10-I70.0). Electronically Signed   By: San Morelle M.D.   On: 04/14/2020 12:04     LOS: 2 days   Oren Binet, MD  [facility]    To contact the attending provider between 7A-7P or the covering provider during after hours 7P-7A, please log into the web site [email or web address] and access using universal [place] password for that web site. If you do not have the password, please call the hospital operator.  04/15/2020, 4:48 PM

## 2020-04-15 NOTE — Consult Note (Addendum)
Patient still undergoing adjustments of his new onset diabetes diagnosis.  He has no neurologic symptoms.  I reviewed his CT angiogram of the head and neck and most likely he will need a T CAR stent bilaterally.  We will make measurements off of his CT scan today regarding this.  Tentatively procedure will be scheduled for May 04, 2020.  I will schedule him for an office visit on June 10 at his request so that his significant other can be present for the visit and discussion.  He needs cardiac risk assessment prior to this procedure.  Hopefully get while he is inpt.  Call if questions.  Ruta Hinds, MD [facility] Office: [phone]

## 2020-04-16 ENCOUNTER — Telehealth: Payer: Self-pay

## 2020-04-16 ENCOUNTER — Other Ambulatory Visit: Payer: Self-pay | Admitting: Gastroenterology

## 2020-04-16 ENCOUNTER — Other Ambulatory Visit: Payer: Self-pay | Admitting: Internal Medicine

## 2020-04-16 DIAGNOSIS — R1310 Dysphagia, unspecified: Secondary | ICD-10-CM

## 2020-04-16 DIAGNOSIS — D7589 Other specified diseases of blood and blood-forming organs: Secondary | ICD-10-CM

## 2020-04-16 DIAGNOSIS — W010XXA Fall on same level from slipping, tripping and stumbling without subsequent striking against object, initial encounter: Secondary | ICD-10-CM

## 2020-04-16 DIAGNOSIS — E440 Moderate protein-calorie malnutrition: Secondary | ICD-10-CM | POA: Insufficient documentation

## 2020-04-16 DIAGNOSIS — K222 Esophageal obstruction: Secondary | ICD-10-CM

## 2020-04-16 DIAGNOSIS — G629 Polyneuropathy, unspecified: Secondary | ICD-10-CM

## 2020-04-16 LAB — COMPREHENSIVE METABOLIC PANEL
ALT: 14 U/L (ref 0–44)
AST: 16 U/L (ref 15–41)
Albumin: 3.4 g/dL — ABNORMAL LOW (ref 3.5–5.0)
Alkaline Phosphatase: 60 U/L (ref 38–126)
Anion gap: 11 (ref 5–15)
BUN: 14 mg/dL (ref 8–23)
CO2: 24 mmol/L (ref 22–32)
Calcium: 9.4 mg/dL (ref 8.9–10.3)
Chloride: 99 mmol/L (ref 98–111)
Creatinine, Ser: 1.01 mg/dL (ref 0.61–1.24)
GFR calc Af Amer: >60 mL/min (ref >60)
GFR calc non Af Amer: >60 mL/min (ref >60)
Glucose, Bld: 258 mg/dL — ABNORMAL HIGH (ref 70–99)
Potassium: 3.7 mmol/L (ref 3.5–5.1)
Sodium: 134 mmol/L — ABNORMAL LOW (ref 135–145)
Total Bilirubin: 0.4 mg/dL (ref 0.3–1.2)
Total Protein: 7.5 g/dL (ref 6.5–8.1)

## 2020-04-16 LAB — LIPID PANEL
Cholesterol: 168 mg/dL (ref 0–200)
HDL: 29 mg/dL — ABNORMAL LOW (ref >40)
LDL Cholesterol: 83 mg/dL (ref 0–99)
Total CHOL/HDL Ratio: 5.8 RATIO
Triglycerides: 278 mg/dL — ABNORMAL HIGH (ref <150)
VLDL: 56 mg/dL — ABNORMAL HIGH (ref 0–40)

## 2020-04-16 LAB — CBC
HCT: 34.1 % — ABNORMAL LOW (ref 39.0–52.0)
Hemoglobin: 11.6 g/dL — ABNORMAL LOW (ref 13.0–17.0)
MCH: 33.4 pg (ref 26.0–34.0)
MCHC: 34 g/dL (ref 30.0–36.0)
MCV: 98.3 fL (ref 80.0–100.0)
Platelets: 186 10*3/uL (ref 150–400)
RBC: 3.47 MIL/uL — ABNORMAL LOW (ref 4.22–5.81)
RDW: 14.3 % (ref 11.5–15.5)
WBC: 4.1 10*3/uL (ref 4.0–10.5)
nRBC: 0 % (ref 0.0–0.2)

## 2020-04-16 LAB — MAGNESIUM: Magnesium: 1.9 mg/dL (ref 1.7–2.4)

## 2020-04-16 LAB — GLUCOSE, CAPILLARY: Glucose-Capillary: 317 mg/dL — ABNORMAL HIGH (ref 70–99)

## 2020-04-16 MED ORDER — METOPROLOL SUCCINATE ER 25 MG PO TB24: 25.0000 mg | ORAL_TABLET | Freq: Every day | ORAL | 0 refills | Status: DC

## 2020-04-16 MED ORDER — NOVOLOG FLEXPEN 100 UNIT/ML ~~LOC~~ SOPN
PEN_INJECTOR | SUBCUTANEOUS | 0 refills | Status: DC
Start: 2020-04-16 — End: 2020-05-18

## 2020-04-16 MED ORDER — FOLIC ACID 1 MG PO TABS: 2.0000 mg | ORAL_TABLET | Freq: Every day | ORAL | 0 refills | Status: AC

## 2020-04-16 MED ORDER — INSULIN GLARGINE 100 UNIT/ML SOLOSTAR PEN
26.0000 [IU] | PEN_INJECTOR | Freq: Every day | SUBCUTANEOUS | 0 refills | Status: DC
Start: 2020-04-16 — End: 2020-11-27

## 2020-04-16 MED ORDER — CLOPIDOGREL BISULFATE 75 MG PO TABS: 75.0000 mg | ORAL_TABLET | Freq: Every day | ORAL | 0 refills | Status: AC

## 2020-04-16 MED ORDER — GLUCERNA SHAKE PO LIQD: 237.0000 mL | Freq: Three times a day (TID) | ORAL | 0 refills | Status: AC

## 2020-04-16 MED ORDER — ASPIRIN 81 MG PO TBEC: 81.0000 mg | DELAYED_RELEASE_TABLET | Freq: Every day | ORAL | 0 refills | Status: AC

## 2020-04-16 MED ORDER — INSULIN GLARGINE 100 UNIT/ML ~~LOC~~ SOLN: 26.0000 [IU] | Freq: Every day | SUBCUTANEOUS | Status: DC

## 2020-04-16 MED ORDER — BLOOD GLUCOSE METER KIT: PACK | 0 refills | Status: AC

## 2020-04-16 MED ORDER — INSULIN PEN NEEDLE 32G X 8 MM MISC: 0 refills | Status: AC

## 2020-04-16 MED ORDER — ROSUVASTATIN CALCIUM 20 MG PO TABS: 20.0000 mg | ORAL_TABLET | Freq: Every day | ORAL | 0 refills | Status: AC

## 2020-04-16 NOTE — Progress Notes (Signed)
Christopher [name] to be D/C'd Home per MD order.  Discussed with the patient and all questions fully answered.  VSS, Skin clean, dry and intact without evidence of skin break down, no evidence of skin tears noted. IV catheter discontinued intact. Site without signs and symptoms of complications. Dressing and pressure applied.  An After Visit Summary was printed and given to the patient. Patient received prescription.  D/c education completed with patient & family including follow up instructions, medication list, d/c activities limitations if indicated, with other d/c instructions as indicated by MD - patient able to verbalize understanding, all questions fully answered.   Patient instructed to return to ED, call 911, or call MD for any changes in condition.   Patient escorted via [place], and D/C home via private auto.  Jeanella Craze 04/16/2020 11:41 AM

## 2020-04-16 NOTE — Discharge Summary (Signed)
PATIENT DETAILS Name: Christopher [name] Age: 69 y.o. Sex: male Date of Birth: 10-13-1951 MRN: [id number]. Admitting Physician: Christopher Leff, MD WLS:[id number], Christopher Moll, MD  Admit Date: 04/13/2020 Discharge date: 04/16/2020  Recommendations for Outpatient Follow-up:  1. Follow up with PCP in 1-2 weeks 2. Please obtain CMP/CBC in one week 3. Please ensure follow-up with cardiology, vascular surgery  Admitted From:  Home  Disposition: Home with home health    [place]: Yes  Equipment/Devices: None  Discharge Condition: Stable  CODE STATUS: FULL CODE  Diet recommendation:  Diet Order            Diet - low sodium heart healthy        Diet Carb Modified        Diet heart healthy/carb modified Room service appropriate? Yes with Assist; Fluid consistency: Thin  Diet effective now              Brief Narrative: Patient is a 69 y.o. male with history of HIV, HLD, GERD, hypothyroidism peripheral neuropathy-walks with a walker at home-presented with 1 month history of polyuria polydipsia-and a mechanical fall on the day of admission-he was found to have uncontrolled diabetes with hyperglycemia complicated by hyperosmolar state-started on insulin infusion-and subsequently admitted to the hospitalist service.  See below for further details.    Significant events: 5/24>> admit to [facility] for HSS/mechanical fall.  Significant studies: 5/24>> chest x-ray: Mild bronchitis changes 5/24>> CT head:Trace amount of right hyperdense parafalcine thickening which may be calcification.Consider short-term (6-hour) follow-up 5/24>> CT C-spine: No evidence of acute fracture or traumatic listhesis of the cervical spine. 5/24>> CT head: No change in the anterior parafalcine region compared to earlier-suspect mild calcification in this area.  Sitter short(6 hour) interval follow-up to assess for stability or resolution 5/24>> carotid ultrasound: right ICA 80-99% stenosis 5/25 >>CTA head and  neck: High-grade near occlusive stenosis of the right ICA at the bifurcation, high-grade near occlusive stenosis of the left ICA 3 cm from the bifurcation, moderate proximal left vertebral artery stenosis.  Antimicrobial therapy: None  Microbiology data: None  Procedures : None  Consults: Vascular surgery Cardiology  Brief Hospital Course: New onset DM-2 with uncontrolled hyperglycemia (A1c 87.6 on 8/11) complicated by hyperosmolar state: Initially required IV insulin infusion-CBGs overall better but still requires further optimization of insulin regimen by PCP in the outpatient setting.  On day of discharge-patient will be discharged on 26 units of Lantus, and a sliding scale regimen.  Prior to discharge-insulin administration education will be provided by the RN-however patient's significant other is a diabetic and is on insulin at home-and he feels very comfortable injecting insulin on the patient.  Mechanical fall-with scalp laceration: CT head negative x2 for acute abnormalities (right hyperdense parafalcine thickening likely a calcification).  Continue local wound care-supportive care.  Suspect mechanical fall could have been from orthostatic hypotension physiology in the setting of severe hyperglycemia.  PT eval completed-recommendations are for home health.  AKI on CKD stage IIIa: AKI hemodynamically mediated in the setting of uncontrolled hyperglycemia.  Bilateral ICA stenosis: Found incidentally during work-up for mechanical fall-do not think ICA stenosis was the etiology for his mechanical fall.  Appreciate vascular surgery follow-up-Per vascular surgery-patient needs inpatient cardiac arrest assessment-tentative plans are for carotid artery stent placement bilaterally on 05/04/2020.    Preoperative cardiology evaluation done-stress test/echocardiogram will be done by cardiology in the outpatient setting prior to patient's scheduled vascular surgery procedure on 6/14.  Patient  will be continued on aspirin/Plavix  and statin on discharge.    Pseudohyponatremia in the setting of uncontrolled hyperglycemia: Resolved.  Hypokalemia: Repleted  Macrocytic anemia: Probably secondary to HIV/antiretrovirals-however has folic acid deficiency-on supplementation.  Follow CBC periodically.  HIV: Continue antiretrovirals.  Hypothyroidism: Continue Synthroid  Minimally elevated bilirubin level: Unknown clinical significance-may be Gilbert's syndrome-RUQ ultrasound negative for acute abnormalities-showed cholelithiasis-this is asymptomatic and does not warrant inpatient evaluation.  Repeat LFTs tomorrow morning.  4 mm meningioma: Seen incidentally on CT head-stable for outpatient follow-up.  Cholelithiasis: Seen incidentally on abdominal ultrasound-asymptomatic-does not require further work-up during inpatient-follow with PCP.  History of head and neck cancer s/p right neck surgery/radiation/surgery-approximately 10 years back  Nutrition Problem: Nutrition Problem: Moderate Malnutrition Etiology: chronic illness(HIV) Signs/Symptoms: moderate fat depletion, moderate muscle depletion Interventions: Glucerna shake   Discharge Diagnoses:  Principal Problem:   Hyperosmolar hyperglycemic state (HHS) ([place]) Active Problems:   Diabetes ([place])   Head injury   Diarrhea   AKI (acute kidney injury) ([place])   Malnutrition of moderate degree   Discharge Instructions:  Activity:  As tolerated with Full fall precautions use walker/cane & assistance as needed  Discharge Instructions    Call MD for:  difficulty breathing, headache or visual disturbances   Complete by: As directed    Call MD for:  extreme fatigue   Complete by: As directed    Call MD for:  persistant dizziness or light-headedness   Complete by: As directed    Diet - low sodium heart healthy   Complete by: As directed    Diet Carb Modified   Complete by: As directed    Discharge instructions   Complete  by: As directed    Follow with Primary MD  [name], MD in 1-2 weeks  Please follow-up with cardiology and vascular surgery as instructed  Please check your CBGs (blood glucose) 3-4 times a day-keep a recording of these readings-and take it with you to your next appointment with your PCP.  Please get a complete blood count and chemistry panel checked by your Primary MD at your next visit, and again as instructed by your Primary MD.  Get Medicines reviewed and adjusted: Please take all your medications with you for your next visit with your Primary MD  Laboratory/radiological data: Please request your Primary MD to go over all hospital tests and procedure/radiological results at the follow up, please ask your Primary MD to get all Hospital records sent to his/her office.  In some cases, they will be blood work, cultures and biopsy results pending at the time of your discharge. Please request that your primary care M.D. follows up on these results.  Also Note the following: If you experience worsening of your admission symptoms, develop shortness of breath, life threatening emergency, suicidal or homicidal thoughts you must seek medical attention immediately by calling 911 or calling your MD immediately  if symptoms less severe.  You must read complete instructions/literature along with all the possible adverse reactions/side effects for all the Medicines you take and that have been prescribed to you. Take any new Medicines after you have completely understood and accpet all the possible adverse reactions/side effects.   Do not drive when taking Pain medications or sleeping medications (Benzodaizepines)  Do not take more than prescribed Pain, Sleep and Anxiety Medications. It is not advisable to combine anxiety,sleep and pain medications without talking with your primary care practitioner  Special Instructions: If you have smoked or chewed Tobacco  in the last 2 yrs please stop  smoking,  stop any regular Alcohol  and or any Recreational drug use.  Wear Seat belts while driving.  Please note: You were cared for by a hospitalist during your hospital stay. Once you are discharged, your primary care physician will handle any further medical issues. Please note that NO REFILLS for any discharge medications will be authorized once you are discharged, as it is imperative that you return to your primary care physician (or establish a relationship with a primary care physician if you do not have one) for your post hospital discharge needs so that they can reassess your need for medications and monitor your lab values.   Increase activity slowly   Complete by: As directed      Allergies as of 04/16/2020      Reactions   Codeine Other (See Comments)   "makes me crazy"   Sulfamethoxazole-trimethoprim Itching, Other (See Comments)   "makes me crazy"   Sulfonamide Derivatives Itching, Other (See Comments)   "makes me crazy"      Medication List    STOP taking these medications   fenofibrate 145 MG tablet Commonly known as: TRICOR     TAKE these medications   aspirin 81 MG EC tablet Take 1 tablet (81 mg total) by mouth daily. Start taking on: Apr 17, 2020   blood glucose meter kit and supplies Dispense based on patient and insurance preference. Use up to four times daily as directed. (FOR ICD-10 E10.9, E11.9).   clonazePAM 1 MG tablet Commonly known as: KLONOPIN TAKE 1 TABLET 3 TIMES A DAY AS NEEDED FOR ANXIETY What changed: See the new instructions.   clopidogrel 75 MG tablet Commonly known as: PLAVIX Take 1 tablet (75 mg total) by mouth daily. Start taking on: Apr 17, 2020   diphenhydrAMINE 25 MG tablet Commonly known as: BENADRYL Take 50 mg by mouth every 6 (six) hours as needed.   feeding supplement (GLUCERNA SHAKE) Liqd Take 237 mLs by mouth 3 (three) times daily between meals.   FLUoxetine 20 MG capsule Commonly known as: PROZAC TAKE ONE CAPSULE  BY MOUTH DAILY   folic acid 1 MG tablet Commonly known as: FOLVITE Take 2 tablets (2 mg total) by mouth daily. Start taking on: Apr 17, 2020   insulin glargine 100 UNIT/ML Solostar Pen Commonly known as: LANTUS Inject 26 Units into the skin daily at 10 pm.   Insulin Pen Needle 32G X 8 MM Misc Use as directed   levothyroxine 50 MCG tablet Commonly known as: SYNTHROID Take 1 tablet (50 mcg total) by mouth daily before breakfast.   metoCLOPramide 5 MG tablet Commonly known as: REGLAN TAKE ONE TABLET BY MOUTH THREE TIMES A DAY BEFORE MEALS What changed: See the new instructions.   metoprolol succinate 25 MG 24 hr tablet Commonly known as: TOPROL-XL Take 1 tablet (25 mg total) by mouth daily. Start taking on: Apr 17, 2020   NovoLOG FlexPen 100 UNIT/ML FlexPen Generic drug: insulin aspart 0-15 Units, Subcutaneous, 3 times daily with meals CBG < 70: implement hypoglycemia protocol-call MD CBG 70 - 120: 0 units CBG 121 - 150: 2 units CBG 151 - 200: 3 units CBG 201 - 250: 5 units CBG 251 - 300: 8 units CBG 301 - 350: 11 units CBG 351 - 400: 15 units CBG > 400:   pantoprazole 40 MG tablet Commonly known as: PROTONIX TAKE ONE TABLET BY MOUTH DAILY What changed:   how much to take  how to take this  when to take this  additional instructions   rosuvastatin 20 MG tablet Commonly known as: CRESTOR Take 1 tablet (20 mg total) by mouth daily. Start taking on: Apr 17, 2020   temazepam 30 MG capsule Commonly known as: RESTORIL Take 30 mg by mouth at bedtime.   Triumeq 600-50-300 MG tablet Generic drug: abacavir-dolutegravir-lamiVUDine TAKE ONE TABLET BY MOUTH DAILY      Follow-up Information    Elam Dutch, MD Follow up in 3 week(s).   Specialties: Vascular Surgery, Cardiology Why: [place] will call Contact information: [place] [address] [phone]        [name], MD. Schedule an appointment as soon as possible for a visit  in 1 week(s).   Specialty: Internal Medicine Contact information: [address] [place] [address] [phone]        Michel Bickers, MD. Schedule an appointment as soon as possible for a visit in 2 week(s).   Specialty: Infectious Diseases Contact information: [address] [place] [address] [phone]        Rex Kras, DO Follow up.   Specialties: Cardiology, Radiology, Vascular Surgery Why: office will call  Contact information: [address] [place] [address] [phone]          Allergies  Allergen Reactions  . Codeine Other (See Comments)    "makes me crazy"  . Sulfamethoxazole-Trimethoprim Itching and Other (See Comments)    "makes me crazy"  . Sulfonamide Derivatives Itching and Other (See Comments)    "makes me crazy"     Other Procedures/Studies: CT ANGIO HEAD W OR WO CONTRAST  Result Date: 04/14/2020 CLINICAL DATA:  Carotid artery stenosis. Recent head trauma. EXAM: CT ANGIOGRAPHY HEAD AND NECK TECHNIQUE: Multidetector CT imaging of the head and neck was performed using the standard protocol during bolus administration of intravenous contrast. Multiplanar CT image reconstructions and MIPs were obtained to evaluate the vascular anatomy. Carotid stenosis measurements (when applicable) are obtained utilizing NASCET criteria, using the distal internal carotid diameter as the denominator. CONTRAST:  1104m OMNIPAQUE IOHEXOL 350 MG/ML SOLN COMPARISON:  CT head without contrast 04/13/2020 FINDINGS: CT HEAD FINDINGS Brain: Mild generalized atrophy and white matter hypoattenuation is again noted bilaterally. Slight thickening of the posterior falx is stable, likely ossification. A 4 mm meningioma is present posteriorly. No extra-axial hemorrhage or fluid is present. The brainstem and cerebellum are within normal limits. No acute infarct, hemorrhage, or mass lesion is present. Vascular: Atherosclerotic  calcifications are again seen within the cavernous internal carotid arteries bilaterally. No hyperdense vessel is present. Skull: Right temporoparietal scalp soft tissue swelling is present. Skin staples are in place. No underlying fracture is present. Calvarium is intact. Sinuses: Mild circumferential mucosal thickening is present right maxillary sinus. No fluid level or fracture is present. The paranasal sinuses and mastoid air cells are otherwise clear. Orbits: Bilateral lens replacements are noted. Globes and orbits are otherwise unremarkable. Review of the MIP images confirms the above findings CTA NECK FINDINGS Aortic arch: Atherosclerotic calcifications are present in the distal arch. No significant aneurysm is present. No significant stenosis is present at the great vessel origins. Right carotid system: Atherosclerotic irregularity is noted along the walls of proximal right common carotid artery without a significant stenosis. A high-grade, near occlusive stenosis is present in the proximal left ICA. A tandem high-grade, near occlusive stenosis in the left ICA is noted 1.5 cm more distal. This results in asymmetric attenuation of the distal right  ICA lumen. Left carotid system: The left common carotid artery demonstrates proximal tortuosity. Anterior atherosclerotic changes are present in the mid common carotid artery without a significant stenosis. Dense calcifications present about the left carotid bifurcation. The minimal luminal diameter of the proximal left ICA is 2.8 mm proximally. More distally scratched at 3 cm from the bifurcation is a high-grade, near occlusive stenosis. Non calcified plaque can be seen. More distal left ICA lumen is normal size. Vertebral arteries: Right vertebral artery is the dominant vessel. Atherosclerotic calcifications are present at the origins of both vessels. There is no significant stenosis on the right. Moderate proximal left vertebral artery stenosis is present. There  is some irregularity in the left vertebral artery in the neck without a significant stenosis in the 3 year V4 segment. There is a moderate left vertebral artery stenosis at the dural margin just after the left PICA originates. No significant stenosis is present on the right. Skeleton: Posterior elements are fused at C2-3. Asymmetric degenerative facet changes are present on the right at C3-4 and C4-5. Vertebral body heights are maintained. No focal lytic or blastic lesions are present. Other neck: Soft tissues of the neck are otherwise within normal limits. Upper chest: Patchy airspace opacity is present lung apices, right greater than left. Thoracic inlet is normal. Review of the MIP images confirms the above findings CTA HEAD FINDINGS Anterior circulation: Dense atherosclerotic changes are present within the cavernous internal carotid arteries. A high-grade stenosis is present at the paraophthalmic segment of the right ICA. No significant stenosis is present the left. A high-grade stenosis is present in the proximal right A1 segment. Left A1 segment is normal. The anterior communicating artery is patent. M1 segments are normal. MCA bifurcations are intact. ACA and MCA branch vessels are unremarkable. Posterior circulation: Right vertebral artery is the dominant vessel. The left vertebral artery is hypoplastic above the dura. 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 Both posterior cerebral arteries originate from basilar tip. The PCA branch vessels are within normal limits. Moderate narrowing is present in the distal right P2 segment. Venous sinuses: Dural sinuses are patent. The straight sinus and deep cerebral veins are intact. Cortical veins are unremarkable. No vascular malformations are present. Anatomic variants: None Review of the MIP images confirms the above findings IMPRESSION: 1. 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  2. High-grade, near occlusive, stenosis of the left internal carotid artery 3 cm from the bifurcation. The distal left ICA is the more normal vessel. 3. Moderate proximal left vertebral artery stenosis. 4. Moderate left vertebral artery stenosis at the dural margin just after the left PICA originates. 5. 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 6. Moderate narrowing of the distal right P2 segment. 7. Mild generalized atrophy and white matter disease is stable. 8. 4 mm meningioma posteriorly. 9. No extra-axial hemorrhage. 10. Right temporoparietal scalp soft tissue swelling is improving. 11. Aortic Atherosclerosis (ICD10-I70.0). Electronically Signed   By: San Morelle M.D.   On: 04/14/2020 12:04   CT HEAD WO CONTRAST  Result Date: 04/13/2020 CLINICAL DATA:  Pain following trauma EXAM: CT HEAD WITHOUT CONTRAST TECHNIQUE: Contiguous axial images were obtained from the base of the skull through the vertex without intravenous contrast. COMPARISON:  Apr 13, 2020 study obtained earlier in the day FINDINGS: Brain: The rather equivocal parafalcine thickening anteriorly is unchanged compared to earlier in the day, likely representing focal calcification in this area. There is  no new opacity or new thickening in this area compared to earlier in the day. This area most likely represents localized is calcification. Elsewhere there is age related volume loss, stable. No mass, well-defined hemorrhage, extra-axial fluid collection, or midline shift. There is relatively mild patchy small vessel disease in the centra semiovale bilaterally. No acute appearing infarct evident. Vascular: No hyperdense vessels. There is calcification in the right distal vertebral artery and in the carotid siphon regions bilaterally. Skull: The bony calvarium appears intact. Sinuses/Orbits: There is mucosal thickening in the right maxillary antrum. There is mucosal thickening in several ethmoid air cells bilaterally. Orbits appear symmetric  bilaterally. Other: There is opacification of multiple mastoid air cells bilaterally, stable. IMPRESSION: 1. No change in the anterior parafalcine region compared to earlier in the day. Suspect mild calcification in this area. 2. There is age related volume loss with patchy periventricular small vessel disease. No mass or acute infarct. No well-defined area of hemorrhage. No extra-axial fluid. 3.  There are foci of arterial vascular calcification. 4.  Foci of paranasal sinus disease and mastoid disease bilaterally. Electronically Signed   By: Lowella Grip III M.D.   On: 04/13/2020 09:09   CT Head Wo Contrast  Addendum Date: 04/13/2020   ADDENDUM REPORT: 04/13/2020 02:42 ADDENDUM: These results were called by telephone at the time of interpretation on 04/13/2020 at 2:42 am to provider DAVID Bergan [facility] , who verbally acknowledged these results. Electronically Signed   By: Lovena Le M.D.   On: 04/13/2020 02:42   Result Date: 04/13/2020 CLINICAL DATA:  Slipped and fell with positive head strike, laceration to the right temple EXAM: CT HEAD WITHOUT CONTRAST CT CERVICAL SPINE WITHOUT CONTRAST TECHNIQUE: Multidetector CT imaging of the head and cervical spine was performed following the standard protocol without intravenous contrast. Multiplanar CT image reconstructions of the cervical spine were also generated. COMPARISON:  Maxillofacial CT 05/03/2012, CT neck 05/04/2010 FINDINGS: CT HEAD FINDINGS Brain: There is a trace amount of right hyperdense parafalcine thickening which may be calcification though is more indeterminate in the setting of trauma and absence of direct visual comparison (3/26). No other sites suspicious for intracranial hemorrhage. No extra-axial collection. No mass effect or midline shift. No CT evidence of large vascular territory infarct. No convincing features of hydrocephaly. Symmetric prominence of the ventricles, cisterns and sulci compatible with parenchymal volume loss. Patchy areas of  white matter hypoattenuation are most compatible with chronic microvascular angiopathy. Vascular: Atherosclerotic calcification of the carotid siphons and intradural vertebral arteries. No hyperdense vessel. Skull: Extensive soft tissue swelling and thickening over the right temporoparietal region with soft tissue laceration and hematoma of the parietal scalp (3/25) measuring up to a maximal thickness of 9 mm. Overlying bandaging material is present. No subjacent calvarial fracture. No visible or suspected temporal bone fracture. Partial visualization of the reconstructive changes of the right mandible. Sinuses/Orbits: Periodontal disease including a periapical lucency of the right maxillary molars. 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 Remaining paranasal sinuses are predominantly clear. Small dependent bilateral mastoid effusions. Middle ear cavities are clear. Ossicular chains are normally configured. Debris in the right external auditory canal. Other: None CT CERVICAL SPINE FINDINGS Alignment: Cervical stabilization collar is in place at the time of examination. Preservation of the normal cervical lordosis. Likely degenerative anterolisthesis C4 on C5 with most pronounced facet changes at these levels. No evidence of traumatic listhesis. No abnormally widened, perched or jumped facets. Partial fusion of the  posterior C2-3 vertebral bodies and the left articular facets. Normal alignment of the craniocervical and atlantoaxial articulations. Skull base and vertebrae: The osseous structures appear diffusely demineralized which may limit detection of small or nondisplaced fractures. Partial fusion of C2-3, as above. Advanced arthrosis of the C1-2 articulation anteriorly with some calcific pannus formation posterior to the dens. No visible skull base fracture. No acute cervical spine fracture or vertebral body height loss. No worrisome osseous lesions. Soft tissues and  spinal canal: No pre or paravertebral fluid or swelling. No visible canal hematoma. Disc levels: Multilevel intervertebral disc height loss with spondylitic endplate changes. Small posterior disc osteophyte complexes at several levels without significant canal stenosis. Uncinate spurring and facet hypertrophic changes result in mild-to-moderate multilevel foraminal narrowing most pronounced at C4-5. Upper chest: Some biapical pleuroparenchymal scarring is noted. No acute abnormality in the lung apices. Other: Extensive cervical carotid atherosclerosis. Diminutive appearance of the thyroid. Paucity of subcutaneous fat. IMPRESSION: CT HEAD 1. Trace amount of right hyperdense parafalcine thickening which may be calcification though is more indeterminate in the setting of trauma and absence of direct visual comparison. Consider short-term (6 hour) interval follow-up to assess for stability or resolution. 2. No other acute intracranial abnormality. Background parenchymal volume loss and chronic microvascular angiopathy. 3. Extensive soft tissue swelling and thickening over the right temporoparietal region with soft tissue laceration and hematoma of the parietal scalp. No subjacent calvarial fracture. CT CERVICAL SPINE 1. No evidence of acute fracture or traumatic listhesis of the cervical spine. Osseous structures appear diffusely demineralized which may limit detection of small or nondisplaced fractures. 2. Multilevel degenerative disc disease and facet hypertrophic changes of the cervical spine, detailed above. 3. Extensive cervical carotid atherosclerosis. Currently attempting to contact the ordering provider with a critical value result. Addendum will be submitted upon case discussion. Electronically Signed: By: Lovena Le M.D. On: 04/13/2020 02:33   CT ANGIO NECK W OR WO CONTRAST  Result Date: 04/14/2020 CLINICAL DATA:  Carotid artery stenosis. Recent head trauma. EXAM: CT ANGIOGRAPHY HEAD AND NECK TECHNIQUE:  Multidetector CT imaging of the head and neck was performed using the standard protocol during bolus administration of intravenous contrast. Multiplanar CT image reconstructions and MIPs were obtained to evaluate the vascular anatomy. Carotid stenosis measurements (when applicable) are obtained utilizing NASCET criteria, using the distal internal carotid diameter as the denominator. CONTRAST:  182m OMNIPAQUE IOHEXOL 350 MG/ML SOLN COMPARISON:  CT head without contrast 04/13/2020 FINDINGS: CT HEAD FINDINGS Brain: Mild generalized atrophy and white matter hypoattenuation is again noted bilaterally. Slight thickening of the posterior falx is stable, likely ossification. A 4 mm meningioma is present posteriorly. No extra-axial hemorrhage or fluid is present. The brainstem and cerebellum are within normal limits. No acute infarct, hemorrhage, or mass lesion is present. Vascular: Atherosclerotic calcifications are again seen within the cavernous internal carotid arteries bilaterally. No hyperdense vessel is present. Skull: Right temporoparietal scalp soft tissue swelling is present. Skin staples are in place. No underlying fracture is present. Calvarium is intact. Sinuses: Mild circumferential mucosal thickening is present right maxillary sinus. No fluid level or fracture is present. The paranasal sinuses and mastoid air cells are otherwise clear. Orbits: Bilateral lens replacements are noted. Globes and orbits are otherwise unremarkable. Review of the MIP images confirms the above findings CTA NECK FINDINGS Aortic arch: Atherosclerotic calcifications are present in the distal arch. No significant aneurysm is present. No significant stenosis is present at the great vessel origins. Right carotid system: Atherosclerotic irregularity is noted  along the walls of proximal right common carotid artery without a significant stenosis. A high-grade, near occlusive stenosis is present in the proximal left ICA. A tandem high-grade,  near occlusive stenosis in the left ICA is noted 1.5 cm more distal. This results in asymmetric attenuation of the distal right ICA lumen. Left carotid system: The left common carotid artery demonstrates proximal tortuosity. Anterior atherosclerotic changes are present in the mid common carotid artery without a significant stenosis. Dense calcifications present about the left carotid bifurcation. The minimal luminal diameter of the proximal left ICA is 2.8 mm proximally. More distally scratched at 3 cm from the bifurcation is a high-grade, near occlusive stenosis. Non calcified plaque can be seen. More distal left ICA lumen is normal size. Vertebral arteries: Right vertebral artery is the dominant vessel. Atherosclerotic calcifications are present at the origins of both vessels. There is no significant stenosis on the right. Moderate proximal left vertebral artery stenosis is present. There is some irregularity in the left vertebral artery in the neck without a significant stenosis in the 3 year V4 segment. There is a moderate left vertebral artery stenosis at the dural margin just after the left PICA originates. No significant stenosis is present on the right. Skeleton: Posterior elements are fused at C2-3. Asymmetric degenerative facet changes are present on the right at C3-4 and C4-5. Vertebral body heights are maintained. No focal lytic or blastic lesions are present. Other neck: Soft tissues of the neck are otherwise within normal limits. Upper chest: Patchy airspace opacity is present lung apices, right greater than left. Thoracic inlet is normal. Review of the MIP images confirms the above findings CTA HEAD FINDINGS Anterior circulation: Dense atherosclerotic changes are present within the cavernous internal carotid arteries. A high-grade stenosis is present at the paraophthalmic segment of the right ICA. No significant stenosis is present the left. A high-grade stenosis is present in the proximal right A1  segment. Left A1 segment is normal. The anterior communicating artery is patent. M1 segments are normal. MCA bifurcations are intact. ACA and MCA branch vessels are unremarkable. Posterior circulation: Right vertebral artery is the dominant vessel. The left vertebral artery is hypoplastic above the dura. 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 Both posterior cerebral arteries originate from basilar tip. The PCA branch vessels are within normal limits. Moderate narrowing is present in the distal right P2 segment. Venous sinuses: Dural sinuses are patent. The straight sinus and deep cerebral veins are intact. Cortical veins are unremarkable. No vascular malformations are present. Anatomic variants: None Review of the MIP images confirms the above findings IMPRESSION: 1. 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 2. High-grade, near occlusive, stenosis of the left internal carotid artery 3 cm from the bifurcation. The distal left ICA is the more normal vessel. 3. Moderate proximal left vertebral artery stenosis. 4. Moderate left vertebral artery stenosis at the dural margin just after the left PICA originates. 5. 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 6. Moderate narrowing of the distal right P2 segment. 7. Mild generalized atrophy and white matter disease is stable. 8. 4 mm meningioma posteriorly. 9. No extra-axial hemorrhage. 10. Right temporoparietal scalp soft tissue swelling is improving. 11. Aortic Atherosclerosis (ICD10-I70.0). Electronically Signed   By: San Morelle M.D.   On: 04/14/2020 12:04   CT Cervical Spine Wo Contrast  Addendum Date: 04/13/2020   ADDENDUM REPORT: 04/13/2020 02:42 ADDENDUM: These results were called by telephone at  the time of interpretation on 04/13/2020 at 2:42 am to provider DAVID [facility] , who verbally acknowledged these results. Electronically Signed   By: Lovena Le M.D.   On: 04/13/2020  02:42   Result Date: 04/13/2020 CLINICAL DATA:  Slipped and fell with positive head strike, laceration to the right temple EXAM: CT HEAD WITHOUT CONTRAST CT CERVICAL SPINE WITHOUT CONTRAST TECHNIQUE: Multidetector CT imaging of the head and cervical spine was performed following the standard protocol without intravenous contrast. Multiplanar CT image reconstructions of the cervical spine were also generated. COMPARISON:  Maxillofacial CT 05/03/2012, CT neck 05/04/2010 FINDINGS: CT HEAD FINDINGS Brain: There is a trace amount of right hyperdense parafalcine thickening which may be calcification though is more indeterminate in the setting of trauma and absence of direct visual comparison (3/26). No other sites suspicious for intracranial hemorrhage. No extra-axial collection. No mass effect or midline shift. No CT evidence of large vascular territory infarct. No convincing features of hydrocephaly. Symmetric prominence of the ventricles, cisterns and sulci compatible with parenchymal volume loss. Patchy areas of white matter hypoattenuation are most compatible with chronic microvascular angiopathy. Vascular: Atherosclerotic calcification of the carotid siphons and intradural vertebral arteries. No hyperdense vessel. Skull: Extensive soft tissue swelling and thickening over the right temporoparietal region with soft tissue laceration and hematoma of the parietal scalp (3/25) measuring up to a maximal thickness of 9 mm. Overlying bandaging material is present. No subjacent calvarial fracture. No visible or suspected temporal bone fracture. Partial visualization of the reconstructive changes of the right mandible. Sinuses/Orbits: Periodontal disease including a periapical lucency of the right maxillary molars. 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 Remaining paranasal sinuses are predominantly clear. Small dependent bilateral mastoid effusions. Middle ear cavities are  clear. Ossicular chains are normally configured. Debris in the right external auditory canal. Other: None CT CERVICAL SPINE FINDINGS Alignment: Cervical stabilization collar is in place at the time of examination. Preservation of the normal cervical lordosis. Likely degenerative anterolisthesis C4 on C5 with most pronounced facet changes at these levels. No evidence of traumatic listhesis. No abnormally widened, perched or jumped facets. Partial fusion of the posterior C2-3 vertebral bodies and the left articular facets. Normal alignment of the craniocervical and atlantoaxial articulations. Skull base and vertebrae: The osseous structures appear diffusely demineralized which may limit detection of small or nondisplaced fractures. Partial fusion of C2-3, as above. Advanced arthrosis of the C1-2 articulation anteriorly with some calcific pannus formation posterior to the dens. No visible skull base fracture. No acute cervical spine fracture or vertebral body height loss. No worrisome osseous lesions. Soft tissues and spinal canal: No pre or paravertebral fluid or swelling. No visible canal hematoma. Disc levels: Multilevel intervertebral disc height loss with spondylitic endplate changes. Small posterior disc osteophyte complexes at several levels without significant canal stenosis. Uncinate spurring and facet hypertrophic changes result in mild-to-moderate multilevel foraminal narrowing most pronounced at C4-5. Upper chest: Some biapical pleuroparenchymal scarring is noted. No acute abnormality in the lung apices. Other: Extensive cervical carotid atherosclerosis. Diminutive appearance of the thyroid. Paucity of subcutaneous fat. IMPRESSION: CT HEAD 1. Trace amount of right hyperdense parafalcine thickening which may be calcification though is more indeterminate in the setting of trauma and absence of direct visual comparison. Consider short-term (6 hour) interval follow-up to assess for stability or resolution. 2.  No other acute intracranial abnormality. Background parenchymal volume loss and chronic microvascular angiopathy. 3. Extensive soft tissue swelling and thickening over  the right temporoparietal region with soft tissue laceration and hematoma of the parietal scalp. No subjacent calvarial fracture. CT CERVICAL SPINE 1. No evidence of acute fracture or traumatic listhesis of the cervical spine. Osseous structures appear diffusely demineralized which may limit detection of small or nondisplaced fractures. 2. Multilevel degenerative disc disease and facet hypertrophic changes of the cervical spine, detailed above. 3. Extensive cervical carotid atherosclerosis. Currently attempting to contact the ordering provider with a critical value result. Addendum will be submitted upon case discussion. Electronically Signed: By: Lovena Le M.D. On: 04/13/2020 02:33   DG CHEST PORT 1 VIEW  Result Date: 04/13/2020 CLINICAL DATA:  Cough EXAM: PORTABLE CHEST 1 VIEW COMPARISON:  06/13/2012 FINDINGS: Heart is normal size. Mild peribronchial thickening. No confluent opacities or effusions. No acute bony abnormality. IMPRESSION: Mild bronchitic changes. Electronically Signed   By: Rolm Baptise M.D.   On: 04/13/2020 07:07   VAS US CAROTID  Result Date: 04/13/2020 Carotid Arterial Duplex Study Indications:       Syncope and CT of spine shows extensive cervical carotid                    atherosclerosis. Risk Factors:      Hypertension, Diabetes. Other Factors:     CKD III, HIV. tonsillar cancer, history of TB. Comparison Study:  No prior study on file Performing Technologist: Sharion Dove RVS  Examination Guidelines: A complete evaluation includes B-mode imaging, spectral Doppler, color Doppler, and power Doppler as needed of all accessible portions of each vessel. Bilateral testing is considered an integral part of a complete examination. Limited examinations for reoccurring indications may be performed as noted.  Right Carotid  Findings: +----------+--------+--------+--------+---------------------+------------------+           PSV cm/sEDV cm/sStenosisPlaque Description   Comments           +----------+--------+--------+--------+---------------------+------------------+ CCA Prox  112     11                                   intimal thickening +----------+--------+--------+--------+---------------------+------------------+ CCA Distal34      6                                    intimal thickening +----------+--------+--------+--------+---------------------+------------------+ ICA Prox  332     110     80-99%  calcific and                                                              irregular                               +----------+--------+--------+--------+---------------------+------------------+ ICA Mid   163     66                                                      +----------+--------+--------+--------+---------------------+------------------+ ICA Distal93      24                                                      +----------+--------+--------+--------+---------------------+------------------+  ECA       162     17                                                      +----------+--------+--------+--------+---------------------+------------------+ +----------+--------+-------+--------+-------------------+           PSV cm/sEDV cmsDescribeArm Pressure (mmHG) +----------+--------+-------+--------+-------------------+ [id number]                                         +----------+--------+-------+--------+-------------------+ +---------+--------+--+--------+--+ VertebralPSV cm/s53EDV cm/s16 +---------+--------+--+--------+--+  Left Carotid Findings: +----------+--------+--------+--------+------------------+------------------+           PSV cm/sEDV cm/sStenosisPlaque DescriptionComments            +----------+--------+--------+--------+------------------+------------------+ CCA Prox  93      28                                intimal thickening +----------+--------+--------+--------+------------------+------------------+ CCA Distal79      21                                intimal thickening +----------+--------+--------+--------+------------------+------------------+ ICA Prox  45      21              calcific          Shadowing          +----------+--------+--------+--------+------------------+------------------+ ICA Distal126     57                                turbulent flow     +----------+--------+--------+--------+------------------+------------------+ ECA       128     21                                                   +----------+--------+--------+--------+------------------+------------------+ +---------+--------+--+--------+--+ VertebralPSV cm/s88EDV cm/s25 +---------+--------+--+--------+--+   Summary: Right Carotid: Velocities in the right ICA are consistent with a 80-99%                stenosis. Left Carotid: Velocities in the left ICA are consistent with a 1-39% stenosis.               Calcific plaque could obscure higher velocities. Vertebrals:  Bilateral vertebral arteries demonstrate antegrade flow. Subclavians: Left subclavian artery was not visualized. Normal flow hemodynamics              were seen in the right subclavian artery. Clavicle. *See table(s) above for measurements and observations.  Electronically signed by Ruta Hinds MD on 04/13/2020 at 6:33:12 PM.    Final    US Abdomen Limited RUQ  Result Date: 04/13/2020 CLINICAL DATA:  Elevated serum bilirubin EXAM: ULTRASOUND ABDOMEN LIMITED RIGHT UPPER QUADRANT COMPARISON:  None. FINDINGS: Gallbladder: Multiple shadowing gallstones. No gallbladder wall thickening, pericholecystic edema, or focal tenderness. Echogenic areas with subtle ring down artifact is likely floating cholesterol  crystals and adenomyosis. Common bile duct: Diameter: 5 mm.  Where visualized, no filling defect.  Liver: No focal lesion identified. Within normal limits in parenchymal echogenicity. Portal vein is patent on color Doppler imaging with normal direction of blood flow towards the liver. Other: Incidental small right upper pole renal cyst. IMPRESSION: Cholelithiasis without findings of acute cholecystitis. Electronically Signed   By: Monte Fantasia M.D.   On: 04/13/2020 07:05     TODAY-DAY OF DISCHARGE:  Subjective:   Christopher [name] today has no headache,no chest abdominal pain,no new weakness tingling or numbness, feels much better wants to go home today.   Objective:   Blood pressure (!) 141/83, pulse 70, temperature 97.6 F (36.4 C), temperature source Oral, resp. rate 16, SpO2 96 %.  Intake/Output Summary (Last 24 hours) at 04/16/2020 0935 Last data filed at 04/16/2020 0448 Gross per 24 hour  Intake 480 ml  Output 3190 ml  Net -2710 ml   There were no vitals filed for this visit.  Exam: Awake Alert, Oriented *3, No new F.N deficits, Normal affect [place].AT,PERRAL Supple Neck,No JVD, No cervical lymphadenopathy appriciated.  Symmetrical Chest wall movement, Good air movement bilaterally, CTAB RRR,No Gallops,Rubs or new Murmurs, No Parasternal Heave +ve B.Sounds, Abd Soft, Non tender, No organomegaly appriciated, No rebound -guarding or rigidity. No Cyanosis, Clubbing or edema, No new Rash or bruise   PERTINENT RADIOLOGIC STUDIES: CT ANGIO HEAD W OR WO CONTRAST  Result Date: 04/14/2020 CLINICAL DATA:  Carotid artery stenosis. Recent head trauma. EXAM: CT ANGIOGRAPHY HEAD AND NECK TECHNIQUE: Multidetector CT imaging of the head and neck was performed using the standard protocol during bolus administration of intravenous contrast. Multiplanar CT image reconstructions and MIPs were obtained to evaluate the vascular anatomy. Carotid stenosis measurements (when applicable) are obtained  utilizing NASCET criteria, using the distal internal carotid diameter as the denominator. CONTRAST:  139m OMNIPAQUE IOHEXOL 350 MG/ML SOLN COMPARISON:  CT head without contrast 04/13/2020 FINDINGS: CT HEAD FINDINGS Brain: Mild generalized atrophy and white matter hypoattenuation is again noted bilaterally. Slight thickening of the posterior falx is stable, likely ossification. A 4 mm meningioma is present posteriorly. No extra-axial hemorrhage or fluid is present. The brainstem and cerebellum are within normal limits. No acute infarct, hemorrhage, or mass lesion is present. Vascular: Atherosclerotic calcifications are again seen within the cavernous internal carotid arteries bilaterally. No hyperdense vessel is present. Skull: Right temporoparietal scalp soft tissue swelling is present. Skin staples are in place. No underlying fracture is present. Calvarium is intact. Sinuses: Mild circumferential mucosal thickening is present right maxillary sinus. No fluid level or fracture is present. The paranasal sinuses and mastoid air cells are otherwise clear. Orbits: Bilateral lens replacements are noted. Globes and orbits are otherwise unremarkable. Review of the MIP images confirms the above findings CTA NECK FINDINGS Aortic arch: Atherosclerotic calcifications are present in the distal arch. No significant aneurysm is present. No significant stenosis is present at the great vessel origins. Right carotid system: Atherosclerotic irregularity is noted along the walls of proximal right common carotid artery without a significant stenosis. A high-grade, near occlusive stenosis is present in the proximal left ICA. A tandem high-grade, near occlusive stenosis in the left ICA is noted 1.5 cm more distal. This results in asymmetric attenuation of the distal right ICA lumen. Left carotid system: The left common carotid artery demonstrates proximal tortuosity. Anterior atherosclerotic changes are present in the mid common carotid  artery without a significant stenosis. Dense calcifications present about the left carotid bifurcation. The minimal luminal diameter of the proximal left ICA is 2.8 mm proximally. More distally  scratched at 3 cm from the bifurcation is a high-grade, near occlusive stenosis. Non calcified plaque can be seen. More distal left ICA lumen is normal size. Vertebral arteries: Right vertebral artery is the dominant vessel. Atherosclerotic calcifications are present at the origins of both vessels. There is no significant stenosis on the right. Moderate proximal left vertebral artery stenosis is present. There is some irregularity in the left vertebral artery in the neck without a significant stenosis in the 3 year V4 segment. There is a moderate left vertebral artery stenosis at the dural margin just after the left PICA originates. No significant stenosis is present on the right. Skeleton: Posterior elements are fused at C2-3. Asymmetric degenerative facet changes are present on the right at C3-4 and C4-5. Vertebral body heights are maintained. No focal lytic or blastic lesions are present. Other neck: Soft tissues of the neck are otherwise within normal limits. Upper chest: Patchy airspace opacity is present lung apices, right greater than left. Thoracic inlet is normal. Review of the MIP images confirms the above findings CTA HEAD FINDINGS Anterior circulation: Dense atherosclerotic changes are present within the cavernous internal carotid arteries. A high-grade stenosis is present at the paraophthalmic segment of the right ICA. No significant stenosis is present the left. A high-grade stenosis is present in the proximal right A1 segment. Left A1 segment is normal. The anterior communicating artery is patent. M1 segments are normal. MCA bifurcations are intact. ACA and MCA branch vessels are unremarkable. Posterior circulation: Right vertebral artery is the dominant vessel. The left vertebral artery is hypoplastic above  the dura. 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 Both posterior cerebral arteries originate from basilar tip. The PCA branch vessels are within normal limits. Moderate narrowing is present in the distal right P2 segment. Venous sinuses: Dural sinuses are patent. The straight sinus and deep cerebral veins are intact. Cortical veins are unremarkable. No vascular malformations are present. Anatomic variants: None Review of the MIP images confirms the above findings IMPRESSION: 1. 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 2. High-grade, near occlusive, stenosis of the left internal carotid artery 3 cm from the bifurcation. The distal left ICA is the more normal vessel. 3. Moderate proximal left vertebral artery stenosis. 4. Moderate left vertebral artery stenosis at the dural margin just after the left PICA originates. 5. 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 6. Moderate narrowing of the distal right P2 segment. 7. Mild generalized atrophy and white matter disease is stable. 8. 4 mm meningioma posteriorly. 9. No extra-axial hemorrhage. 10. Right temporoparietal scalp soft tissue swelling is improving. 11. Aortic Atherosclerosis (ICD10-I70.0). Electronically Signed   By: San Morelle M.D.   On: 04/14/2020 12:04   CT ANGIO NECK W OR WO CONTRAST  Result Date: 04/14/2020 CLINICAL DATA:  Carotid artery stenosis. Recent head trauma. EXAM: CT ANGIOGRAPHY HEAD AND NECK TECHNIQUE: Multidetector CT imaging of the head and neck was performed using the standard protocol during bolus administration of intravenous contrast. Multiplanar CT image reconstructions and MIPs were obtained to evaluate the vascular anatomy. Carotid stenosis measurements (when applicable) are obtained utilizing NASCET criteria, using the distal internal carotid diameter as the denominator. CONTRAST:  144m OMNIPAQUE IOHEXOL 350 MG/ML SOLN COMPARISON:  CT head  without contrast 04/13/2020 FINDINGS: CT HEAD FINDINGS Brain: Mild generalized atrophy and white matter hypoattenuation is again noted bilaterally. Slight thickening of the posterior falx is stable, likely ossification. A 4 mm meningioma  is present posteriorly. No extra-axial hemorrhage or fluid is present. The brainstem and cerebellum are within normal limits. No acute infarct, hemorrhage, or mass lesion is present. Vascular: Atherosclerotic calcifications are again seen within the cavernous internal carotid arteries bilaterally. No hyperdense vessel is present. Skull: Right temporoparietal scalp soft tissue swelling is present. Skin staples are in place. No underlying fracture is present. Calvarium is intact. Sinuses: Mild circumferential mucosal thickening is present right maxillary sinus. No fluid level or fracture is present. The paranasal sinuses and mastoid air cells are otherwise clear. Orbits: Bilateral lens replacements are noted. Globes and orbits are otherwise unremarkable. Review of the MIP images confirms the above findings CTA NECK FINDINGS Aortic arch: Atherosclerotic calcifications are present in the distal arch. No significant aneurysm is present. No significant stenosis is present at the great vessel origins. Right carotid system: Atherosclerotic irregularity is noted along the walls of proximal right common carotid artery without a significant stenosis. A high-grade, near occlusive stenosis is present in the proximal left ICA. A tandem high-grade, near occlusive stenosis in the left ICA is noted 1.5 cm more distal. This results in asymmetric attenuation of the distal right ICA lumen. Left carotid system: The left common carotid artery demonstrates proximal tortuosity. Anterior atherosclerotic changes are present in the mid common carotid artery without a significant stenosis. Dense calcifications present about the left carotid bifurcation. The minimal luminal diameter of the proximal left ICA is  2.8 mm proximally. More distally scratched at 3 cm from the bifurcation is a high-grade, near occlusive stenosis. Non calcified plaque can be seen. More distal left ICA lumen is normal size. Vertebral arteries: Right vertebral artery is the dominant vessel. Atherosclerotic calcifications are present at the origins of both vessels. There is no significant stenosis on the right. Moderate proximal left vertebral artery stenosis is present. There is some irregularity in the left vertebral artery in the neck without a significant stenosis in the 3 year V4 segment. There is a moderate left vertebral artery stenosis at the dural margin just after the left PICA originates. No significant stenosis is present on the right. Skeleton: Posterior elements are fused at C2-3. Asymmetric degenerative facet changes are present on the right at C3-4 and C4-5. Vertebral body heights are maintained. No focal lytic or blastic lesions are present. Other neck: Soft tissues of the neck are otherwise within normal limits. Upper chest: Patchy airspace opacity is present lung apices, right greater than left. Thoracic inlet is normal. Review of the MIP images confirms the above findings CTA HEAD FINDINGS Anterior circulation: Dense atherosclerotic changes are present within the cavernous internal carotid arteries. A high-grade stenosis is present at the paraophthalmic segment of the right ICA. No significant stenosis is present the left. A high-grade stenosis is present in the proximal right A1 segment. Left A1 segment is normal. The anterior communicating artery is patent. M1 segments are normal. MCA bifurcations are intact. ACA and MCA branch vessels are unremarkable. Posterior circulation: Right vertebral artery is the dominant vessel. The left vertebral artery is hypoplastic above the dura. 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 Both posterior cerebral arteries originate from basilar tip. The PCA branch vessels are within normal limits. Moderate  narrowing is present in the distal right P2 segment. Venous sinuses: Dural sinuses are patent. The straight sinus and deep cerebral veins are intact. Cortical veins are unremarkable. No vascular malformations are present. Anatomic variants: None Review of the MIP images confirms the above findings IMPRESSION: 1. 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 2. High-grade, near occlusive, stenosis of the left internal carotid artery 3 cm from the bifurcation. The distal left ICA is the more normal vessel. 3. Moderate proximal left vertebral artery stenosis. 4. Moderate left vertebral artery stenosis at the dural margin just after the left PICA originates. 5. 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 6. Moderate narrowing of the distal right P2 segment. 7. Mild generalized atrophy and white matter disease is stable. 8. 4 mm meningioma posteriorly. 9. No extra-axial hemorrhage. 10. Right temporoparietal scalp soft tissue swelling is improving. 11. Aortic Atherosclerosis (ICD10-I70.0). Electronically Signed   By: San Morelle M.D.   On: 04/14/2020 12:04     PERTINENT LAB RESULTS: CBC: Recent Labs    04/15/20 0232 04/16/20 0438  WBC 3.7* 4.1  HGB 10.9* 11.6*  HCT 31.8* 34.1*  PLT 149* 186   CMET CMP     Component Value Date/Time   NA 134 (L) 04/16/2020 0438   NA 140 10/31/2011 1039   K 3.7 04/16/2020 0438   K 4.4 10/31/2011 1039   CL 99 04/16/2020 0438   CL 100 10/31/2011 1039   CO2 24 04/16/2020 0438   CO2 30 10/31/2011 1039   GLUCOSE 258 (H) 04/16/2020 0438   GLUCOSE 97 10/31/2011 1039   BUN 14 04/16/2020 0438   BUN 18 10/31/2011 1039   CREATININE 1.01 04/16/2020 0438   CREATININE 1.20 09/20/2019 1116   CALCIUM 9.4 04/16/2020 0438   CALCIUM 9.4 10/31/2011 1039   PROT 7.5 04/16/2020 0438   PROT 7.8 10/31/2011 1039   ALBUMIN 3.4 (L) 04/16/2020 0438   ALBUMIN 3.8 10/31/2011 1039   AST 16  04/16/2020 0438   AST 34 10/31/2011 1039   ALT 14 04/16/2020 0438   ALT 31 10/31/2011 1039   ALKPHOS 60 04/16/2020 0438   ALKPHOS 98 (H) 10/31/2011 1039   BILITOT 0.4 04/16/2020 0438   BILITOT 0.80 10/31/2011 1039   GFRNONAA >60 04/16/2020 0438   GFRNONAA 43 (L) 09/04/2018 1154   GFRAA >60 04/16/2020 0438   GFRAA 50 (L) 09/04/2018 1154    GFR CrCl cannot be calculated (Unknown ideal weight.). No results for input(s): LIPASE, AMYLASE in the last 72 hours. No results for input(s): CKTOTAL, CKMB, CKMBINDEX, TROPONINI in the last 72 hours. Invalid input(s): POCBNP No results for input(s): DDIMER in the last 72 hours. No results for input(s): HGBA1C in the last 72 hours. Recent Labs    04/16/20 0438  CHOL 168  HDL 29*  LDLCALC 83  TRIG 278*  CHOLHDL 5.8   No results for input(s): TSH, T4TOTAL, T3FREE, THYROIDAB in the last 72 hours.  Invalid input(s): FREET3 No results for input(s): VITAMINB12, FOLATE, FERRITIN, TIBC, IRON, RETICCTPCT in the last 72 hours. Coags: No results for input(s): INR in the last 72 hours.  Invalid input(s): PT Microbiology: Recent Results (from the past 240 hour(s))  SARS Coronavirus 2 by RT PCR (hospital order, performed in [facility] hospital lab) Nasopharyngeal Nasopharyngeal Swab     Status: None   Collection Time: 04/13/20  4:09 AM   Specimen: Nasopharyngeal Swab  Result Value Ref Range Status   SARS Coronavirus 2 NEGATIVE NEGATIVE Final    Comment: (NOTE) SARS-CoV-2 target nucleic acids are NOT DETECTED. The SARS-CoV-2 RNA is generally detectable in upper and lower respiratory specimens during the acute phase of infection. The lowest concentration of SARS-CoV-2 viral copies this assay can detect is 250 copies /  mL. A negative result does not preclude SARS-CoV-2 infection and should not be used as the sole basis for treatment or other patient management decisions.  A negative result may occur with improper specimen collection / handling,  submission of specimen other than nasopharyngeal swab, presence of viral mutation(s) within the areas targeted by this assay, and inadequate number of viral copies (<250 copies / mL). A negative result must be combined with clinical observations, patient history, and epidemiological information. Fact Sheet for Patients:   StrictlyIdeas.no Fact Sheet for Healthcare Providers: BankingDealers.co.za This test is not yet approved or cleared  by the [place] FDA and has been authorized for detection and/or diagnosis of SARS-CoV-2 by FDA under an Emergency Use Authorization (EUA).  This EUA will remain in effect (meaning this test can be used) for the duration of the COVID-19 declaration under Section 564(b)(1) of the Act, 21 U.S.C. section 360bbb-3(b)(1), unless the authorization is terminated or revoked sooner. Performed at [facility], [place] [address], [place], [place] [address]   MRSA PCR Screening     Status: None   Collection Time: 04/13/20  6:49 PM   Specimen: Nasal Mucosa; Nasopharyngeal  Result Value Ref Range Status   MRSA by PCR NEGATIVE NEGATIVE Final    Comment:        The GeneXpert MRSA Assay (FDA approved for NASAL specimens only), is one component of a comprehensive MRSA colonization surveillance program. It is not intended to diagnose MRSA infection nor to guide or monitor treatment for MRSA infections. Performed at [facility], [place] [address], [place], [place] [address]     FURTHER DISCHARGE INSTRUCTIONS:  Get Medicines reviewed and adjusted: Please take all your medications with you for your next visit with your Primary MD  Laboratory/radiological data: Please request your Primary MD to go over all hospital tests and procedure/radiological results at the follow up, please ask your Primary MD to get all Hospital records sent to his/her office.  In some cases, they will be blood work, cultures and  biopsy results pending at the time of your discharge. Please request that your primary care M.D. goes through all the records of your hospital data and follows up on these results.  Also Note the following: If you experience worsening of your admission symptoms, develop shortness of breath, life threatening emergency, suicidal or homicidal thoughts you must seek medical attention immediately by calling 911 or calling your MD immediately  if symptoms less severe.  You must read complete instructions/literature along with all the possible adverse reactions/side effects for all the Medicines you take and that have been prescribed to you. Take any new Medicines after you have completely understood and accpet all the possible adverse reactions/side effects.   Do not drive when taking Pain medications or sleeping medications (Benzodaizepines)  Do not take more than prescribed Pain, Sleep and Anxiety Medications. It is not advisable to combine anxiety,sleep and pain medications without talking with your primary care practitioner  Special Instructions: If you have smoked or chewed Tobacco  in the last 2 yrs please stop smoking, stop any regular Alcohol  and or any Recreational drug use.  Wear Seat belts while driving.  Please note: You were cared for by a hospitalist during your hospital stay. Once you are discharged, your primary care physician will handle any further medical issues. Please note that NO REFILLS for any discharge medications will be authorized once you are discharged, as it is imperative that you return to your  primary care physician (or establish a relationship with a primary care physician if you do not have one) for your post hospital discharge needs so that they can reassess your need for medications and monitor your lab values.  Total Time spent coordinating discharge including counseling, education and face to face time equals 35 minutes.  SignedOren Binet 04/16/2020 9:35  AM

## 2020-04-16 NOTE — Telephone Encounter (Signed)
Patient's spouse called office today regarding patient's most recent ED visit. Was instructed to follow up with Dr. Megan Salon after discharge. Spouse states patient fell and was told he had a high glucose of 1500 and blocked arteries. Would like to know if appt is needed at this time. Would like to know if so if they would be able to do a virtual visit. [place]

## 2020-04-16 NOTE — Care Management (Signed)
Pt deemed stable for discharge home today.  CM called pts room today spoke with pts husband.  Pts husband informed CM that he would order the tub seat/riser independently and pt/husband continue to decline HH as ordered. Husband confirmed pt has PCP and denied barriers with paying for medications.  No other CM needs determined - CM signing off

## 2020-04-17 ENCOUNTER — Other Ambulatory Visit: Payer: Self-pay

## 2020-04-17 NOTE — Telephone Encounter (Signed)
Patient's husband called office back to schedule appointment. Can only come on Monday June 7th due to multiple appointments. Scheduled appointment will call if earlier appt is needed. [place]

## 2020-04-20 NOTE — Telephone Encounter (Signed)
Please set this up as a MyChart video visit.

## 2020-04-21 ENCOUNTER — Ambulatory Visit: Payer: PRIVATE HEALTH INSURANCE

## 2020-04-21 ENCOUNTER — Other Ambulatory Visit: Payer: Self-pay

## 2020-04-21 ENCOUNTER — Other Ambulatory Visit: Payer: Self-pay | Admitting: Internal Medicine

## 2020-04-21 DIAGNOSIS — R011 Cardiac murmur, unspecified: Secondary | ICD-10-CM

## 2020-04-21 DIAGNOSIS — G629 Polyneuropathy, unspecified: Secondary | ICD-10-CM

## 2020-04-27 ENCOUNTER — Encounter: Payer: Self-pay | Admitting: Internal Medicine

## 2020-04-27 ENCOUNTER — Ambulatory Visit (INDEPENDENT_AMBULATORY_CARE_PROVIDER_SITE_OTHER): Payer: PRIVATE HEALTH INSURANCE | Admitting: Internal Medicine

## 2020-04-27 ENCOUNTER — Ambulatory Visit: Payer: PRIVATE HEALTH INSURANCE

## 2020-04-27 ENCOUNTER — Other Ambulatory Visit: Payer: Self-pay

## 2020-04-27 DIAGNOSIS — B20 Human immunodeficiency virus [HIV] disease: Secondary | ICD-10-CM

## 2020-04-27 DIAGNOSIS — Z794 Long term (current) use of insulin: Secondary | ICD-10-CM

## 2020-04-27 DIAGNOSIS — I6523 Occlusion and stenosis of bilateral carotid arteries: Secondary | ICD-10-CM

## 2020-04-27 DIAGNOSIS — Z01810 Encounter for preprocedural cardiovascular examination: Secondary | ICD-10-CM

## 2020-04-27 NOTE — Progress Notes (Signed)
Patient Active Problem List   Diagnosis Date Noted  . Diabetes ([place]) 04/13/2020    Priority: High  . Peripheral neuropathy 09/30/2013    Priority: High  . Human immunodeficiency virus (HIV) disease ([place]) 12/01/2006    Priority: High  . Depression 10/01/2013    Priority: Medium  . Renal insufficiency 08/19/2013    Priority: Medium  . 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 Malnutrition of moderate degree 04/16/2020  . Hyperosmolar hyperglycemic state (HHS) ([place]) 04/13/2020  . Head injury 04/13/2020  . Diarrhea 04/13/2020  . AKI (acute kidney injury) ([place]) 04/13/2020  . Hypothyroidism 11/01/2012  . Hyperglycemia 12/08/2011  . HYPOGONADISM 12/23/2009  . Dyslipidemia 01/03/2008  . SYPHILIS, [place], LATENT NOS 01/18/2007  . Essential hypertension 12/01/2006  . PROTEINURIA 12/01/2006    Patient's Medications  New Prescriptions   No medications on file  Previous Medications   ASPIRIN EC 81 MG EC TABLET    Take 1 tablet (81 mg total) by mouth daily.   BLOOD GLUCOSE METER KIT AND SUPPLIES    Dispense based on patient and insurance preference. Use up to four times daily as directed. (FOR ICD-10 E10.9, E11.9).   CLONAZEPAM (KLONOPIN) 1 MG TABLET    TAKE 1 TABLET 3 TIMES A DAY AS NEEDED FOR ANXIETY   CLOPIDOGREL (PLAVIX) 75 MG TABLET    Take 1 tablet (75 mg total) by mouth daily.   DIPHENHYDRAMINE (BENADRYL) 25 MG TABLET    Take 50 mg by mouth every 6 (six) hours as needed.   DOLUTEGRAVIR SODIUM (TIVICAY) 50 MG TABS    Take 1 tablet (50 mg total) by mouth daily.   FEEDING SUPPLEMENT, GLUCERNA SHAKE, (GLUCERNA SHAKE) LIQD    Take 237 mLs by mouth 3 (three) times daily between meals.   FLUOXETINE (PROZAC) 20 MG CAPSULE    TAKE ONE CAPSULE BY MOUTH DAILY   FOLIC ACID (FOLVITE) 1 MG TABLET    Take 2 tablets (2 mg total) by mouth daily.   INSULIN ASPART (NOVOLOG FLEXPEN) 100 UNIT/ML FLEXPEN    0-15 Units,  Subcutaneous, 3 times daily with meals CBG < 70: implement hypoglycemia protocol-call MD CBG 70 - 120: 0 units CBG 121 - 150: 2 units CBG 151 - 200: 3 units CBG 201 - 250: 5 units CBG 251 - 300: 8 units CBG 301 - 350: 11 units CBG 351 - 400: 15 units CBG > 400:   INSULIN GLARGINE (LANTUS) 100 UNIT/ML SOLOSTAR PEN    Inject 26 Units into the skin daily at 10 pm.   INSULIN PEN NEEDLE 32G X 8 MM MISC    Use as directed   LEVOTHYROXINE (SYNTHROID, LEVOTHROID) 50 MCG TABLET    Take 1 tablet (50 mcg total) by mouth daily before breakfast.   METOCLOPRAMIDE (REGLAN) 5 MG TABLET    TAKE ONE TABLET BY MOUTH THREE TIMES A DAY BEFORE MEALS   METOPROLOL SUCCINATE (TOPROL-XL) 25 MG 24 HR TABLET    Take 1 tablet (25 mg total) by mouth daily.   PANTOPRAZOLE (PROTONIX) 40 MG TABLET    TAKE ONE TABLET BY MOUTH DAILY   ROSUVASTATIN (CRESTOR) 20 MG TABLET    Take 1 tablet (20 mg total) by mouth daily.   TEMAZEPAM (RESTORIL) 30 MG CAPSULE    Take 30 mg by mouth at bedtime.   [place] 600-50-300 MG TABLET  TAKE ONE TABLET BY MOUTH DAILY  Modified Medications   No medications on file  Discontinued Medications   No medications on file    Subjective: [name] is in for his routine HIV follow-up visit.  He has wounds obtaining, taking or tolerating his Triumeq and has not missed any doses.  He completed his Pfizer Covid vaccine without difficulty.  Several months ago he began to notice imbalance, polyuria and polydipsia.  He sustained a fall and was recently hospitalized and found to have diabetes.  He is feeling much better now.  He is taking insulin and monitoring his sugars at home.  Review of Systems: Review of Systems  Constitutional: Negative for chills, diaphoresis and fever.  Gastrointestinal: Negative for abdominal pain, diarrhea, heartburn, nausea and vomiting.  Psychiatric/Behavioral: Negative for depression.    Past Medical History:  Diagnosis Date  . Anxiety   . Cataract   . Chronic kidney  disease   . GERD (gastroesophageal reflux disease)   . Heart murmur   . HIV DISEASE 12/01/2006  . HYPERLIPIDEMIA, WITH LOW HDL 01/03/2008  . Hypertension   . HYPERTENSION 12/01/2006  . HYPOGONADISM 12/23/2009  . Neuromuscular disorder ([place])   . SYPHILIS, [place], LATENT NOS 01/18/2007  . Thyroid disease   . 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 Tuberculosis    history of TB about 30 years ago    Social History   Tobacco Use  . Smoking status: Former Research scientist (life sciences)  . Smokeless tobacco: Never Used  . Tobacco comment: quit when he was 69 years old.  Substance Use Topics  . Alcohol use: No    Alcohol/week: 2.0 standard drinks    Types: 2 Shots of liquor per week    Comment: very rare   . Drug use: No    Comment: 40 years ago used multiple drugs    Family History  Problem Relation Age of Onset  . Diabetes Mother   . COPD Mother   . Heart attack Father   . Colon cancer Neg Hx   . Colon polyps Neg Hx   . Esophageal cancer Neg Hx   . Rectal cancer Neg Hx   . Stomach cancer Neg Hx     Allergies  Allergen Reactions  . Codeine Other (See Comments)    "makes me crazy"  . Sulfamethoxazole-Trimethoprim Itching and Other (See Comments)    "makes me crazy"  . Sulfonamide Derivatives Itching and Other (See Comments)    "makes me crazy"    Health Maintenance  Topic Date Due  . FOOT EXAM  Never done  . OPHTHALMOLOGY EXAM  Never done  . URINE MICROALBUMIN  Never done  . COVID-19 Vaccine (1) Never done  . COLONOSCOPY  Never done  . PNA vac Low Risk Adult (1 of 2 - PCV13) 02/08/2015  . TETANUS/TDAP  05/20/2019  . INFLUENZA VACCINE  06/21/2020  . HEMOGLOBIN A1C  10/14/2020  . Hepatitis C Screening  Completed    Objective:  Vitals:   04/27/20 1457  BP: 93/63  Pulse: 82  Temp: 97.9 F (36.6 C)  SpO2: 100%  Weight: 143 lb 3.2 oz (65 kg)   Body mass index is 20.55 kg/m.  Physical Exam Constitutional:      Comments: He is in good spirits.  Cardiovascular:     Rate and  Rhythm: Normal rate and regular rhythm.     Heart sounds: No murmur.  Pulmonary:     Effort: Pulmonary effort is normal.  Breath sounds: Normal breath sounds.  Abdominal:     Palpations: Abdomen is soft.     Tenderness: There is no abdominal tenderness.  Skin:    Findings: No rash.  Psychiatric:        Mood and Affect: Mood normal.     Lab Results Lab Results  Component Value Date   WBC 4.1 04/16/2020   HGB 11.6 (L) 04/16/2020   HCT 34.1 (L) 04/16/2020   MCV 98.3 04/16/2020   PLT 186 04/16/2020    Lab Results  Component Value Date   CREATININE 1.01 04/16/2020   BUN 14 04/16/2020   NA 134 (L) 04/16/2020   K 3.7 04/16/2020   CL 99 04/16/2020   CO2 24 04/16/2020    Lab Results  Component Value Date   ALT 14 04/16/2020   AST 16 04/16/2020   ALKPHOS 60 04/16/2020   BILITOT 0.4 04/16/2020    Lab Results  Component Value Date   CHOL 168 04/16/2020   HDL 29 (L) 04/16/2020   LDLCALC 83 04/16/2020   TRIG 278 (H) 04/16/2020   CHOLHDL 5.8 04/16/2020   Lab Results  Component Value Date   LABRPR NON-REACTIVE 09/20/2019   RPRTITER 1:1 02/02/2012   HIV 1 RNA Quant (copies/mL)  Date Value  09/20/2019 <20 NOT DETECTED  09/04/2018 <20 NOT DETECTED  07/18/2016 <20   CD4 T Cell Abs (/uL)  Date Value  09/20/2019 250 (L)  09/04/2018 430  07/18/2016 370 (L)     Problem List Items Addressed This Visit      High   Human immunodeficiency virus (HIV) disease ([place])    His infection remains under excellent, long-term control.  He will get blood work today, continue Triumeq and follow-up in 1 year.      Relevant Orders   T-helper cell (CD4)- ([facility] clinic only)   HIV-1 RNA quant-no reflex-bld        Michel Bickers, MD [facility] for [place] [phone] pager   [phone] cell 04/27/2020, 3:18 PM

## 2020-04-27 NOTE — Assessment & Plan Note (Signed)
His infection remains under excellent, long-term control.  He will get blood work today, continue Triumeq and follow-up in 1 year.

## 2020-04-28 LAB — T-HELPER CELL (CD4) - (RCID CLINIC ONLY)
CD4 % Helper T Cell: 36 % (ref 33–65)
CD4 T Cell Abs: 554 /uL (ref 400–1790)

## 2020-04-29 LAB — HIV-1 RNA QUANT-NO REFLEX-BLD
HIV 1 RNA Quant: <20 copies/mL
HIV-1 RNA Quant, Log: <1.3 Log copies/mL

## 2020-04-29 NOTE — Progress Notes (Signed)
[name] Date of Birth: Nov 04, 1951 MRN: [id number] Primary Care Provider:Polite, Jori Moll, MD Primary Cardiologist: Rex Kras, DO, [facility] (established care 04/15/2020)  Date: 04/30/20 Last Visit: Visit date not found   Chief Complaint  Patient presents with  . Heart Murmur    clearance   . Follow-up    HPI  Christopher [name] is a 69 y.o.  male who presents to the office with a chief complaint of "pre-procedural risk stratification." Patient's past medical history and cardiovascular risk factors include: Newly diagnosed diabetes mellitus type 2 anxiety, GERD, HIV, hypertension, hyperlipidemia, neuromuscular disorder, hypothyroidism, history of tonsillar cancer, history of TB, advanced age.  Patient is accompanied by his partner Christopher [name] at today's office visit.  He was last seen in a hospital for preoperative risk stratification back in May 2021.   In May 2021 patient presented to the hospital after sustaining a fall and had a scalp laceration.  During the hospitalization he was diagnosed with insulin-dependent diabetes with blood sugars on admission being 1400 and hemoglobin A1c 12.9.  Patient had CTA of the head and neck which noted bilateral carotid artery stenosis and cardiology was consulted for preoperative risk stratification.  The plan was to do as an outpatient and he was discharged home.  He was scheduled for outpatient echo and stress test.  He now presents to the office for preprocedural stratification for upcoming right TCAR carotid stent on May 18, 2020 and subsequently will undergo left TCAR carotid stent after he is recovered from initial procedure.  He is currently being followed by Dr. Oneida Alar.  Patient was informed that his echocardiogram noted preserved left ventricular systolic function without any significant valvular heart disease.  However, he is noted to have fixed perfusion defect involving the RCA with peri-infarct ischemia.  Nuclear stress test images were  independently reviewed and also discussed with the patient's partner Christopher [name] at today's visit.  Patient currently is not having active chest pain at rest or with effort related activities.  Patient is not very functionally active at baseline.  Clinically he is not in congestive heart failure.  History of insulin-dependent diabetes mellitus.  Denies prior history of coronary artery disease, myocardial infarction, congestive heart failure, deep venous thrombosis, pulmonary embolism, stroke, transient ischemic attack.  FUNCTIONAL STATUS: No structured exercise program or daily routine.  But is able to his activities of daily living.  ALLERGIES: Allergies  Allergen Reactions  . Codeine Other (See Comments)    "makes me crazy"  . Sulfamethoxazole-Trimethoprim Itching and Other (See Comments)    "makes me crazy"  . Sulfonamide Derivatives Itching and Other (See Comments)    "makes me crazy"     MEDICATION LIST PRIOR TO VISIT: Current Outpatient Medications on File Prior to Visit  Medication Sig Dispense Refill  . aspirin EC 81 MG EC tablet Take 1 tablet (81 mg total) by mouth daily. 30 tablet 0  . benazepril (LOTENSIN) 10 MG tablet Take 10 mg by mouth daily.    . blood glucose meter kit and supplies Dispense based on patient and insurance preference. Use up to four times daily as directed. (FOR ICD-10 E10.9, E11.9). 1 each 0  . clonazePAM (KLONOPIN) 1 MG tablet TAKE 1 TABLET 3 TIMES A DAY AS NEEDED FOR ANXIETY (Patient taking differently: Take 1 mg by mouth 3 (three) times daily as needed for anxiety. ) 90 tablet 2  . clopidogrel (PLAVIX) 75 MG tablet Take 1 tablet (75 mg total) by mouth daily. 30 tablet 0  .  diphenhydrAMINE (BENADRYL) 25 MG tablet Take 50 mg by mouth daily.     . feeding supplement, GLUCERNA SHAKE, (GLUCERNA SHAKE) LIQD Take 237 mLs by mouth 3 (three) times daily between meals. (Patient taking differently: Take 237 mLs by mouth daily as needed (nutrition). ) 21330 mL 0  .  FLUoxetine (PROZAC) 20 MG capsule TAKE ONE CAPSULE BY MOUTH DAILY (Patient taking differently: Take 20 mg by mouth daily. ) 90 capsule 0  . folic acid (FOLVITE) 1 MG tablet Take 2 tablets (2 mg total) by mouth daily. (Patient taking differently: Take 1 mg by mouth daily. ) 60 tablet 0  . insulin aspart (NOVOLOG FLEXPEN) 100 UNIT/ML FlexPen 0-15 Units, Subcutaneous, 3 times daily with meals CBG < 70: implement hypoglycemia protocol-call MD CBG 70 - 120: 0 units CBG 121 - 150: 2 units CBG 151 - 200: 3 units CBG 201 - 250: 5 units CBG 251 - 300: 8 units CBG 301 - 350: 11 units CBG 351 - 400: 15 units CBG > 400: (Patient not taking: Reported on 04/30/2020) 15 mL 0  . insulin glargine (LANTUS) 100 UNIT/ML Solostar Pen Inject 26 Units into the skin daily at 10 pm. 15 mL 0  . Insulin Pen Needle 32G X 8 MM MISC Use as directed 100 each 0  . levothyroxine (SYNTHROID, LEVOTHROID) 50 MCG tablet Take 1 tablet (50 mcg total) by mouth daily before breakfast. 30 tablet 0  . metoprolol succinate (TOPROL-XL) 25 MG 24 hr tablet Take 1 tablet (25 mg total) by mouth daily. 30 tablet 0  . rosuvastatin (CRESTOR) 20 MG tablet Take 1 tablet (20 mg total) by mouth daily. 30 tablet 0  . temazepam (RESTORIL) 30 MG capsule Take 30 mg by mouth at bedtime.  5  . traMADol (ULTRAM) 50 MG tablet Take 50 mg by mouth every 6 (six) hours as needed for moderate pain.     . TRIUMEQ 600-50-300 MG tablet TAKE ONE TABLET BY MOUTH DAILY (Patient taking differently: Take 1 tablet by mouth daily. ) 90 tablet 3  . [DISCONTINUED] Dolutegravir Sodium (TIVICAY) 50 MG TABS Take 1 tablet (50 mg total) by mouth daily. 30 tablet 11   No current facility-administered medications on file prior to visit.    PAST MEDICAL HISTORY: Past Medical History:  Diagnosis Date  . Anxiety   . Cataract   . Chronic kidney disease   . GERD (gastroesophageal reflux disease)   . Heart murmur   . HIV DISEASE 12/01/2006  . HYPERLIPIDEMIA, WITH LOW HDL  01/03/2008  . Hypertension   . HYPERTENSION 12/01/2006  . HYPOGONADISM 12/23/2009  . Neuromuscular disorder ([place])   . SYPHILIS, [place], LATENT NOS 01/18/2007  . Thyroid disease   . Tonsillar cancer ([place])    tonsillar ca   . Tuberculosis    history of TB about 30 years ago    PAST SURGICAL HISTORY: Past Surgical History:  Procedure Laterality Date  . COLONOSCOPY    . TONSILECTOMY, ADENOIDECTOMY, BILATERAL MYRINGOTOMY AND TUBES     tonsil cancer   . UPPER GASTROINTESTINAL ENDOSCOPY      FAMILY HISTORY: The patient's family history includes COPD in his mother; Diabetes in his mother; Heart attack in his father.   SOCIAL HISTORY:  The patient  reports that he has quit smoking. He has never used smokeless tobacco. He reports that he does not drink alcohol and does not use drugs.  Review of Systems  Constitutional: Negative for chills and fever.  HENT: Negative for congestion  and nosebleeds.   Eyes: Negative for discharge, double vision and pain.  Cardiovascular: Negative for chest pain, claudication, dyspnea on exertion, leg swelling, near-syncope, orthopnea, palpitations, paroxysmal nocturnal dyspnea and syncope.  Respiratory: Negative for hemoptysis and shortness of breath.   Musculoskeletal: Negative for muscle cramps and myalgias.  Gastrointestinal: Negative for abdominal pain, constipation, diarrhea, hematemesis, hematochezia, melena, nausea and vomiting.  Neurological: Negative for dizziness and light-headedness.    PHYSICAL EXAM: Vitals with BMI 04/30/2020 04/30/2020 04/30/2020  Height _0  - _1   Weight 145 lbs - 144 lbs  BMI 50.56 - 97.94  Systolic 801 655 374  Diastolic 63 62 67  Pulse 52 - 58    CONSTITUTIONAL: Frail gentleman, appears older than stated age, hemodynamically stable. No acute distress.  SKIN: Skin is warm and dry. No rash noted. No cyanosis. No pallor. No jaundice HEAD: Staples present over the left lateral cranium. Dry blood noted.  EYES: No  scleral icterus MOUTH/THROAT: Moist oral membranes. Poor oral dentition.  NECK: No JVD present. No thyromegaly noted. Bilateral carotid bruits  LYMPHATIC: No visible cervical adenopathy.  CHEST Normal respiratory effort. No intercostal retractions  LUNGS: Clear to auscultation bilaterally. No stridor. No wheezes. No rales.  CARDIOVASCULAR: Regular rate and rhythm, positive M2-L0, soft systolic ejection murmur, no rubs or gallop. ABDOMINAL: No apparent ascites.  EXTREMITIES: No peripheral edema  HEMATOLOGIC: No significant bruising NEUROLOGIC: Oriented to person, place, and time. Nonfocal. Normal muscle tone.  PSYCHIATRIC: Normal mood and affect. Normal behavior. Cooperative  RADIOLOGY: CTA Head and Neck 04/14/2020: 1. 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 2. High-grade, near occlusive, stenosis of the left internal carotid artery 3 cm from the bifurcation. The distal left ICA is the more normal vessel. 3. Moderate proximal left vertebral artery stenosis. 4. Moderate left vertebral artery stenosis at the dural margin just after the left PICA originates. 5. 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 6. Moderate narrowing of the distal right P2 segment. 7. Mild generalized atrophy and white matter disease is stable. 8. 4 mm meningioma posteriorly. 9. No extra-axial hemorrhage. 10. Right temporoparietal scalp soft tissue swelling is improving. 11. Aortic Atherosclerosis (ICD10-I70.0).  CARDIAC DATABASE: EKG: 04/30/2020: Sinus bradycardia, 52 bpm, right bundle branch block, without underlying injury pattern.  Echocardiogram: 04/21/2020: LVEF 55 to 60%, mild LVH, normal diastolic filling pattern, mild MR.  Stress Testing:  Carlton Adam (Walking with mod Bruce)Tetrofosmin Stress Test  04/28/2020: Myocardial perfusion is abnormal. Mild degree medium extent perfusion defect consistent with scar with  superimposed moderate ischemia located in the apical inferior wall, mid inferior wall and basal inferior wall (RCAregion) of left ventricle. Gated SPECT imaging of the left ventricle demonstrating hypokinesis of the apical inferior wall, mid inferior wall and basal inferior wall.  Stress LV EF is normal 58%.  No previous exam available for comparison. Intermediate risk study  Heart Catheterization: None  Carotid duplex: 04/13/2020: Right Carotid: Velocities in the right ICA are consistent with a 80-99% stenosis.  Left Carotid: Velocities in the left ICA are consistent with a 1-39% stenosis. Calcific plaque could obscure higher velocities.  Vertebrals: Bilateral vertebral arteries demonstrate antegrade flow.  Subclavians: Left subclavian artery was not visualized. Normal flow hemodynamics were seen in the right subclavian artery.   LABORATORY DATA: CBC Latest Ref Rng & Units 04/16/2020 04/15/2020 04/13/2020  WBC 4.0 - 10.5 K/uL 4.1 3.7(L) -  Hemoglobin 13.0 - 17.0 g/dL 11.6(L) 10.9(L) 13.6  Hematocrit 39 - 52 % 34.1(L) 31.8(L) 40.0  Platelets 150 - 400 K/uL 186 149(L) -    CMP Latest Ref Rng & Units 04/16/2020 04/15/2020 04/14/2020  Glucose 70 - 99 mg/dL 258(H) 251(H) 290(H)  BUN 8 - 23 mg/dL _0 Creatinine 0.61 - 1.24 mg/dL 1.01 1.04 1.11  Sodium 135 - 145 mmol/L 134(L) 135 135  Potassium 3.5 - 5.1 mmol/L 3.7 3.2(L) 3.7  Chloride 98 - 111 mmol/L 99 100 101  CO2 22 - 32 mmol/L _1 Calcium 8.9 - 10.3 mg/dL 9.4 9.2 8.8(L)  Total Protein 6.5 - 8.1 g/dL 7.5 - -  Total Bilirubin 0.3 - 1.2 mg/dL 0.4 - -  Alkaline Phos 38 - 126 U/L 60 - -  AST 15 - 41 U/L 16 - -  ALT 0 - 44 U/L 14 - -    Lipid Panel     Component Value Date/Time   CHOL 168 04/16/2020 0438   TRIG 278 (H) 04/16/2020 0438   HDL 29 (L) 04/16/2020 0438   CHOLHDL 5.8 04/16/2020 0438   VLDL 56 (H) 04/16/2020 0438   LDLCALC 83 04/16/2020 0438   LDLCALC 105 (H) 09/20/2019 1116    Lab Results  Component Value  Date   HGBA1C 12.9 (H) 04/13/2020   HGBA1C 4.9 01/03/2008   No components found for: NTPROBNP Lab Results  Component Value Date   TSH 2.037 01/07/2013   TSH 0.914 08/29/2012   TSH 0.413 08/23/2012    Cardiac Panel (last 3 results) No results for input(s): CKTOTAL, CKMB, TROPONINIHS, RELINDX in the last 72 hours.  IMPRESSION:    ICD-10-CM   1. Abnormal nuclear stress test  R94.39 EKG 12-Lead    SARS-COV-2 RNA,(COVID-19) QUAL NAAT  2. Preprocedural cardiovascular exam  Z01.810   3. Type 2 diabetes mellitus with hyperglycemia, with long-term current use of insulin (HCC)  E11.65    Z79.4   4. Long-term insulin use (HCC)  Z79.4   5. Bilateral carotid artery stenosis  I65.23   6. RBBB  I45.10   7. HIV infection, unspecified symptom status ([place])  B20   8. Former smoker  Z87.891      RECOMMENDATIONS: RAY GERVASI is a 69 y.o. male whose past medical history and cardiovascular risk factors include: Newly diagnosed diabetes mellitus type 2 anxiety, GERD, HIV, hypertension, hyperlipidemia, neuromuscular disorder, hypothyroidism, history of tonsillar cancer, history of TB, advanced age.  Abnormal nuclear stress test:  Patient's nuclear stress test was performed as part of his preprocedure risk stratification as he is not very active at baseline.  Nuclear stress test was reported to be abnormal. There is a fixed perfusion defect in the inferior myocardium suggestive of scar in the RCA distribution but peri-infarct ischemia cannot be ruled out.   Given the fact the patient was recently hospitalized with admitting blood sugars in the 1400s and A1c of 12.9 his pretest probability of having obstructive CAD is high.  To safely risk stratify him prior to this upcoming procedures he was recommended to proceed with either  left heart catheterization with possible intervention or coronary CTA prior to his elective procedure.  Both the patient and his partner would like to proceed with left  heart catheterization with possible intervention.  The left heart catheterization procedure was explained to the patient and his partner in detail. The indication, alternatives, risks and benefits were reviewed. Complications including but not limited to bleeding, infection, acute kidney injury, blood transfusion, heart rhythm disturbances,  contrast (dye) reaction, damage to the arteries or nerves in the legs or hands, cerebrovascular accident, myocardial infarction, need for emergent bypass surgery, blood clots in the legs, possible need for emergent blood transfusion, and rarely death were reviewed and discussed with the patient. The patient and his partner voices understanding and wishes to proceed.   Preprocedural risk stratification forthcoming.  Bilateral carotid artery stenosis, asymptomatic:  Continue aspirin, statin therapy, Plavix.  Currently being followed by Dr. Oneida Alar.  Tentatively scheduled for right TCAR stent on May 18, 2020  Insulin-dependent diabetes mellitus: Currently managed by primary team  Former smoker: Educated on the importance of continued smoking cessation.  FINAL MEDICATION LIST END OF ENCOUNTER: No orders of the defined types were placed in this encounter.   Medications Discontinued During This Encounter  Medication Reason  . pantoprazole (PROTONIX) 40 MG tablet Completed Course  . metoCLOPramide (REGLAN) 5 MG tablet Completed Course     Current Outpatient Medications:  .  aspirin EC 81 MG EC tablet, Take 1 tablet (81 mg total) by mouth daily., Disp: 30 tablet, Rfl: 0 .  benazepril (LOTENSIN) 10 MG tablet, Take 10 mg by mouth daily., Disp: , Rfl:  .  blood glucose meter kit and supplies, Dispense based on patient and insurance preference. Use up to four times daily as directed. (FOR ICD-10 E10.9, E11.9)., Disp: 1 each, Rfl: 0 .  clonazePAM (KLONOPIN) 1 MG tablet, TAKE 1 TABLET 3 TIMES A DAY AS NEEDED FOR ANXIETY (Patient taking differently: Take 1 mg by  mouth 3 (three) times daily as needed for anxiety. ), Disp: 90 tablet, Rfl: 2 .  clopidogrel (PLAVIX) 75 MG tablet, Take 1 tablet (75 mg total) by mouth daily., Disp: 30 tablet, Rfl: 0 .  diphenhydrAMINE (BENADRYL) 25 MG tablet, Take 50 mg by mouth daily. , Disp: , Rfl:  .  feeding supplement, GLUCERNA SHAKE, (GLUCERNA SHAKE) LIQD, Take 237 mLs by mouth 3 (three) times daily between meals. (Patient taking differently: Take 237 mLs by mouth daily as needed (nutrition). ), Disp: 21330 mL, Rfl: 0 .  FLUoxetine (PROZAC) 20 MG capsule, TAKE ONE CAPSULE BY MOUTH DAILY (Patient taking differently: Take 20 mg by mouth daily. ), Disp: 90 capsule, Rfl: 0 .  folic acid (FOLVITE) 1 MG tablet, Take 2 tablets (2 mg total) by mouth daily. (Patient taking differently: Take 1 mg by mouth daily. ), Disp: 60 tablet, Rfl: 0 .  insulin aspart (NOVOLOG FLEXPEN) 100 UNIT/ML FlexPen, 0-15 Units, Subcutaneous, 3 times daily with meals CBG < 70: implement hypoglycemia protocol-call MD CBG 70 - 120: 0 units CBG 121 - 150: 2 units CBG 151 - 200: 3 units CBG 201 - 250: 5 units CBG 251 - 300: 8 units CBG 301 - 350: 11 units CBG 351 - 400: 15 units CBG > 400: (Patient not taking: Reported on 04/30/2020), Disp: 15 mL, Rfl: 0 .  insulin glargine (LANTUS) 100 UNIT/ML Solostar Pen, Inject 26 Units into the skin daily at 10 pm., Disp: 15 mL, Rfl: 0 .  Insulin Pen Needle 32G X 8 MM MISC, Use as directed, Disp: 100 each, Rfl: 0 .  levothyroxine (SYNTHROID, LEVOTHROID) 50 MCG tablet, Take 1 tablet (50 mcg total) by mouth daily before breakfast., Disp: 30 tablet, Rfl: 0 .  metoprolol succinate (TOPROL-XL) 25 MG 24 hr tablet, Take 1 tablet (25 mg total) by mouth daily., Disp: 30 tablet, Rfl: 0 .  rosuvastatin (CRESTOR) 20 MG tablet, Take 1 tablet (20 mg total) by mouth daily.,  Disp: 30 tablet, Rfl: 0 .  temazepam (RESTORIL) 30 MG capsule, Take 30 mg by mouth at bedtime., Disp: , Rfl: 5 .  traMADol (ULTRAM) 50 MG tablet, Take 50 mg by mouth  every 6 (six) hours as needed for moderate pain. , Disp: , Rfl:  .  TRIUMEQ 600-50-300 MG tablet, TAKE ONE TABLET BY MOUTH DAILY (Patient taking differently: Take 1 tablet by mouth daily. ), Disp: 90 tablet, Rfl: 3 .  fenofibrate (TRICOR) 145 MG tablet, Take 145 mg by mouth daily., Disp: , Rfl:  .  HUMALOG KWIKPEN 100 UNIT/ML KwikPen, Inject 1-5 Units into the skin 3 (three) times daily before meals., Disp: , Rfl:  .  [DISCONTINUED] Dolutegravir Sodium (TIVICAY) 50 MG TABS, Take 1 tablet (50 mg total) by mouth daily., Disp: 30 tablet, Rfl: 11  Orders Placed This Encounter  Procedures  . SARS-COV-2 RNA,(COVID-19) QUAL NAAT  . EKG 12-Lead   --Continue cardiac medications as reconciled in final medication list. --Return in about 4 weeks (around 05/28/2020) for post heart catheterization  follow up.. Or sooner if needed. --Continue follow-up with your primary care physician regarding the management of your other chronic comorbid conditions.  Patient's questions and concerns were addressed to his satisfaction. He voices understanding of the instructions provided during this encounter.   This note was created using a voice recognition software as a result there may be grammatical errors inadvertently enclosed that do not reflect the nature of this encounter. Every attempt is made to correct such errors.  Rex Kras, [place], [facility]  Pager: [phone] Office: [phone]

## 2020-04-30 ENCOUNTER — Ambulatory Visit (INDEPENDENT_AMBULATORY_CARE_PROVIDER_SITE_OTHER): Payer: PRIVATE HEALTH INSURANCE | Admitting: Vascular Surgery

## 2020-04-30 ENCOUNTER — Encounter: Payer: Self-pay | Admitting: Cardiology

## 2020-04-30 ENCOUNTER — Encounter: Payer: Self-pay | Admitting: Vascular Surgery

## 2020-04-30 ENCOUNTER — Ambulatory Visit: Payer: PRIVATE HEALTH INSURANCE | Admitting: Cardiology

## 2020-04-30 ENCOUNTER — Other Ambulatory Visit: Payer: Self-pay

## 2020-04-30 VITALS — BP 102/62 | HR 58 | Temp 98.0°F | Resp 20 | Ht 70.0 in | Wt 144.0 lb

## 2020-04-30 VITALS — BP 102/63 | HR 52 | Ht 70.0 in | Wt 145.0 lb

## 2020-04-30 DIAGNOSIS — Z794 Long term (current) use of insulin: Secondary | ICD-10-CM

## 2020-04-30 DIAGNOSIS — I6523 Occlusion and stenosis of bilateral carotid arteries: Secondary | ICD-10-CM | POA: Diagnosis not present

## 2020-04-30 DIAGNOSIS — I4510 Unspecified right bundle-branch block: Secondary | ICD-10-CM

## 2020-04-30 DIAGNOSIS — E1165 Type 2 diabetes mellitus with hyperglycemia: Secondary | ICD-10-CM

## 2020-04-30 DIAGNOSIS — R9439 Abnormal result of other cardiovascular function study: Secondary | ICD-10-CM

## 2020-04-30 DIAGNOSIS — Z01810 Encounter for preprocedural cardiovascular examination: Secondary | ICD-10-CM

## 2020-04-30 DIAGNOSIS — B20 Human immunodeficiency virus [HIV] disease: Secondary | ICD-10-CM

## 2020-04-30 DIAGNOSIS — Z87891 Personal history of nicotine dependence: Secondary | ICD-10-CM

## 2020-04-30 NOTE — Progress Notes (Signed)
Patient is patient is a 69 year old male seen as a hospital consult May 24 for bilateral asymptomatic carotid stenosis.  His carotid stenosis was found during work-up after a fall and head laceration.  He has no symptoms of TIA amaurosis or stroke.  He was diagnosed with new onset diabetes at the time of his fall.  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  Other chronic medical problems include history of HIV CKD and hypertension.  These have been stable.  Recent serum creatinine was 1 on Apr 16, 2020.  He is currently on Plavix aspirin and a statin.  He has had no neurologic events since his discharge from the hospital.  He did have cardiac preop evaluation and had a stress test which showed scar but no reversible defects.  He also had an echocardiogram on April 21, 2020 which was normal.  He does have a history of tobacco abuse but quit 40 years ago.  Past Medical History:  Diagnosis Date  . Anxiety   . Cataract   . Chronic kidney disease   . GERD (gastroesophageal reflux disease)   . Heart murmur   . HIV DISEASE 12/01/2006  . HYPERLIPIDEMIA, WITH LOW HDL 01/03/2008  . Hypertension   . HYPERTENSION 12/01/2006  . HYPOGONADISM 12/23/2009  . Neuromuscular disorder ([place])   . SYPHILIS, [place], LATENT NOS 01/18/2007  . Thyroid disease   . Tonsillar cancer ([place])    tonsillar ca   . Tuberculosis    history of TB about 30 years ago    Past Surgical History:  Procedure Laterality Date  . COLONOSCOPY    . TONSILECTOMY, ADENOIDECTOMY, BILATERAL MYRINGOTOMY AND TUBES     tonsil cancer   . UPPER GASTROINTESTINAL ENDOSCOPY      Current Outpatient Medications on File Prior to Visit  Medication Sig Dispense Refill  . aspirin EC 81 MG EC tablet Take 1 tablet (81 mg total) by mouth daily. 30 tablet 0  . blood glucose meter kit and supplies Dispense based on patient and insurance preference. Use up to four times daily as directed. (FOR ICD-10 E10.9, E11.9). 1 each 0  .  clonazePAM (KLONOPIN) 1 MG tablet TAKE 1 TABLET 3 TIMES A DAY AS NEEDED FOR ANXIETY (Patient taking differently: Take 1 mg by mouth 3 (three) times daily as needed for anxiety. ) 90 tablet 2  . clopidogrel (PLAVIX) 75 MG tablet Take 1 tablet (75 mg total) by mouth daily. 30 tablet 0  . diphenhydrAMINE (BENADRYL) 25 MG tablet Take 50 mg by mouth every 6 (six) hours as needed.    . feeding supplement, GLUCERNA SHAKE, (GLUCERNA SHAKE) LIQD Take 237 mLs by mouth 3 (three) times daily between meals. 21330 mL 0  . FLUoxetine (PROZAC) 20 MG capsule TAKE ONE CAPSULE BY MOUTH DAILY (Patient taking differently: Take 20 mg by mouth daily. ) 90 capsule 0  . folic acid (FOLVITE) 1 MG tablet Take 2 tablets (2 mg total) by mouth daily. 60 tablet 0  . insulin aspart (NOVOLOG FLEXPEN) 100 UNIT/ML FlexPen 0-15 Units, Subcutaneous, 3 times daily with meals CBG < 70: implement hypoglycemia protocol-call MD CBG 70 - 120: 0 units CBG 121 - 150: 2 units CBG 151 - 200: 3 units CBG 201 - 250: 5 units CBG 251 - 300: 8 units CBG 301 - 350: 11 units CBG 351 - 400: 15 units CBG > 400: 15 mL 0  . insulin glargine (LANTUS) 100 UNIT/ML Solostar Pen Inject 26 Units  into the skin daily at 10 pm. 15 mL 0  . Insulin Pen Needle 32G X 8 MM MISC Use as directed 100 each 0  . levothyroxine (SYNTHROID, LEVOTHROID) 50 MCG tablet Take 1 tablet (50 mcg total) by mouth daily before breakfast. 30 tablet 0  . metoCLOPramide (REGLAN) 5 MG tablet TAKE ONE TABLET BY MOUTH THREE TIMES A DAY BEFORE MEALS 90 tablet 0  . metoprolol succinate (TOPROL-XL) 25 MG 24 hr tablet Take 1 tablet (25 mg total) by mouth daily. 30 tablet 0  . pantoprazole (PROTONIX) 40 MG tablet TAKE ONE TABLET BY MOUTH DAILY (Patient taking differently: Take 40 mg by mouth daily. ) 90 tablet 2  . rosuvastatin (CRESTOR) 20 MG tablet Take 1 tablet (20 mg total) by mouth daily. 30 tablet 0  . temazepam (RESTORIL) 30 MG capsule Take 30 mg by mouth at bedtime.  5  . TRIUMEQ  600-50-300 MG tablet TAKE ONE TABLET BY MOUTH DAILY (Patient taking differently: Take 1 tablet by mouth daily. ) 90 tablet 3  . [DISCONTINUED] Dolutegravir Sodium (TIVICAY) 50 MG TABS Take 1 tablet (50 mg total) by mouth daily. 30 tablet 11   No current facility-administered medications on file prior to visit.    Review of systems: He has no shortness of breath.  He has no chest pain.  Physical exam:  Vitals:   04/30/20 1007 04/30/20 1011  BP: 106/67 102/62  Pulse: (!) 58   Resp: 20   Temp: 98 F (36.7 C)   SpO2: 97%   Weight: 144 lb (65.3 kg)   Height: _0  (1.778 m)     Neck: Skin thickening and areas of skin fixed to the subcutaneous tissue consistent with radiation injury bilaterally  neuro: Symmetric upper extremity lower extremity motor strength which is 5/5 no facial asymmetry HEENT scar and thickening of the neck bilaterally also some mild hoarseness from prior radiation with affecting his salivary glands  Data: I reviewed the patient's recent CT angiogram which shows greater than 80% stenosis of the internal carotid artery bilaterally.  Assessment: Bilateral asymptomatic greater than 80% internal carotid artery stenosis.  Plan: Continue Plavix aspirin and statin.  Patient is scheduled for a right TCAR carotid stent on May 18, 2020.  The plan will be to do interval left TCAR carotid stent after he has recovered from the right side.  Risk benefits possible complications and procedure details including but not limited to bleeding infection stroke risk of 1 to 2% cranial nerve injury risk of less than 5% discussed with the patient and his partner today.  He understands and agrees to proceed.  Ruta Hinds, MD [facility] Office: [phone]

## 2020-04-30 NOTE — H&P (View-Only) (Signed)
Patient is patient is a 69 year old male seen as a hospital consult May 24 for bilateral asymptomatic carotid stenosis.  His carotid stenosis was found during work-up after a fall and head laceration.  He has no symptoms of TIA amaurosis or stroke.  He was diagnosed with new onset diabetes at the time of his fall.  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  Other chronic medical problems include history of HIV CKD and hypertension.  These have been stable.  Recent serum creatinine was 1 on Apr 16, 2020.  He is currently on Plavix aspirin and a statin.  He has had no neurologic events since his discharge from the hospital.  He did have cardiac preop evaluation and had a stress test which showed scar but no reversible defects.  He also had an echocardiogram on April 21, 2020 which was normal.  He does have a history of tobacco abuse but quit 40 years ago.  Past Medical History:  Diagnosis Date  . Anxiety   . Cataract   . Chronic kidney disease   . GERD (gastroesophageal reflux disease)   . Heart murmur   . HIV DISEASE 12/01/2006  . HYPERLIPIDEMIA, WITH LOW HDL 01/03/2008  . Hypertension   . HYPERTENSION 12/01/2006  . HYPOGONADISM 12/23/2009  . Neuromuscular disorder ([place])   . SYPHILIS, [place], LATENT NOS 01/18/2007  . Thyroid disease   . Tonsillar cancer ([place])    tonsillar ca   . Tuberculosis    history of TB about 30 years ago    Past Surgical History:  Procedure Laterality Date  . COLONOSCOPY    . TONSILECTOMY, ADENOIDECTOMY, BILATERAL MYRINGOTOMY AND TUBES     tonsil cancer   . UPPER GASTROINTESTINAL ENDOSCOPY      Current Outpatient Medications on File Prior to Visit  Medication Sig Dispense Refill  . aspirin EC 81 MG EC tablet Take 1 tablet (81 mg total) by mouth daily. 30 tablet 0  . blood glucose meter kit and supplies Dispense based on patient and insurance preference. Use up to four times daily as directed. (FOR ICD-10 E10.9, E11.9). 1 each 0  .  clonazePAM (KLONOPIN) 1 MG tablet TAKE 1 TABLET 3 TIMES A DAY AS NEEDED FOR ANXIETY (Patient taking differently: Take 1 mg by mouth 3 (three) times daily as needed for anxiety. ) 90 tablet 2  . clopidogrel (PLAVIX) 75 MG tablet Take 1 tablet (75 mg total) by mouth daily. 30 tablet 0  . diphenhydrAMINE (BENADRYL) 25 MG tablet Take 50 mg by mouth every 6 (six) hours as needed.    . feeding supplement, GLUCERNA SHAKE, (GLUCERNA SHAKE) LIQD Take 237 mLs by mouth 3 (three) times daily between meals. 21330 mL 0  . FLUoxetine (PROZAC) 20 MG capsule TAKE ONE CAPSULE BY MOUTH DAILY (Patient taking differently: Take 20 mg by mouth daily. ) 90 capsule 0  . folic acid (FOLVITE) 1 MG tablet Take 2 tablets (2 mg total) by mouth daily. 60 tablet 0  . insulin aspart (NOVOLOG FLEXPEN) 100 UNIT/ML FlexPen 0-15 Units, Subcutaneous, 3 times daily with meals CBG < 70: implement hypoglycemia protocol-call MD CBG 70 - 120: 0 units CBG 121 - 150: 2 units CBG 151 - 200: 3 units CBG 201 - 250: 5 units CBG 251 - 300: 8 units CBG 301 - 350: 11 units CBG 351 - 400: 15 units CBG > 400: 15 mL 0  . insulin glargine (LANTUS) 100 UNIT/ML Solostar Pen Inject 26 Units  into the skin daily at 10 pm. 15 mL 0  . Insulin Pen Needle 32G X 8 MM MISC Use as directed 100 each 0  . levothyroxine (SYNTHROID, LEVOTHROID) 50 MCG tablet Take 1 tablet (50 mcg total) by mouth daily before breakfast. 30 tablet 0  . metoCLOPramide (REGLAN) 5 MG tablet TAKE ONE TABLET BY MOUTH THREE TIMES A DAY BEFORE MEALS 90 tablet 0  . metoprolol succinate (TOPROL-XL) 25 MG 24 hr tablet Take 1 tablet (25 mg total) by mouth daily. 30 tablet 0  . pantoprazole (PROTONIX) 40 MG tablet TAKE ONE TABLET BY MOUTH DAILY (Patient taking differently: Take 40 mg by mouth daily. ) 90 tablet 2  . rosuvastatin (CRESTOR) 20 MG tablet Take 1 tablet (20 mg total) by mouth daily. 30 tablet 0  . temazepam (RESTORIL) 30 MG capsule Take 30 mg by mouth at bedtime.  5  . TRIUMEQ  600-50-300 MG tablet TAKE ONE TABLET BY MOUTH DAILY (Patient taking differently: Take 1 tablet by mouth daily. ) 90 tablet 3  . [DISCONTINUED] Dolutegravir Sodium (TIVICAY) 50 MG TABS Take 1 tablet (50 mg total) by mouth daily. 30 tablet 11   No current facility-administered medications on file prior to visit.    Review of systems: He has no shortness of breath.  He has no chest pain.  Physical exam:  Vitals:   04/30/20 1007 04/30/20 1011  BP: 106/67 102/62  Pulse: (!) 58   Resp: 20   Temp: 98 F (36.7 C)   SpO2: 97%   Weight: 144 lb (65.3 kg)   Height: _0  (1.778 m)     Neck: Skin thickening and areas of skin fixed to the subcutaneous tissue consistent with radiation injury bilaterally  neuro: Symmetric upper extremity lower extremity motor strength which is 5/5 no facial asymmetry HEENT scar and thickening of the neck bilaterally also some mild hoarseness from prior radiation with affecting his salivary glands  Data: I reviewed the patient's recent CT angiogram which shows greater than 80% stenosis of the internal carotid artery bilaterally.  Assessment: Bilateral asymptomatic greater than 80% internal carotid artery stenosis.  Plan: Continue Plavix aspirin and statin.  Patient is scheduled for a right TCAR carotid stent on May 18, 2020.  The plan will be to do interval left TCAR carotid stent after he has recovered from the right side.  Risk benefits possible complications and procedure details including but not limited to bleeding infection stroke risk of 1 to 2% cranial nerve injury risk of less than 5% discussed with the patient and his partner today.  He understands and agrees to proceed.  Ruta Hinds, MD [facility] Office: [phone]

## 2020-05-01 ENCOUNTER — Other Ambulatory Visit (HOSPITAL_COMMUNITY)
Admission: RE | Admit: 2020-05-01 | Discharge: 2020-05-01 | Disposition: A | Discharge status: Home / Self Care | Payer: PRIVATE HEALTH INSURANCE | Source: Ambulatory Visit | Attending: Cardiology | Admitting: Cardiology

## 2020-05-01 DIAGNOSIS — Z20822 Contact with and (suspected) exposure to covid-19: Secondary | ICD-10-CM | POA: Diagnosis not present

## 2020-05-01 DIAGNOSIS — Z01812 Encounter for preprocedural laboratory examination: Secondary | ICD-10-CM | POA: Insufficient documentation

## 2020-05-01 LAB — SARS CORONAVIRUS 2 (TAT 6-24 HRS): SARS Coronavirus 2: NEGATIVE

## 2020-05-03 ENCOUNTER — Other Ambulatory Visit: Payer: Self-pay | Admitting: Internal Medicine

## 2020-05-04 ENCOUNTER — Ambulatory Visit: Payer: PRIVATE HEALTH INSURANCE | Admitting: Cardiology

## 2020-05-04 DIAGNOSIS — R9439 Abnormal result of other cardiovascular function study: Secondary | ICD-10-CM

## 2020-05-05 ENCOUNTER — Ambulatory Visit (HOSPITAL_COMMUNITY)
Admission: RE | Admit: 2020-05-05 | Discharge: 2020-05-05 | Disposition: A | Discharge status: Home / Self Care | Payer: No Typology Code available for payment source | Attending: Cardiology | Admitting: Cardiology

## 2020-05-05 ENCOUNTER — Other Ambulatory Visit: Payer: Self-pay

## 2020-05-05 ENCOUNTER — Encounter (HOSPITAL_COMMUNITY)
Admission: RE | Disposition: A | Discharge status: Home / Self Care | Payer: Self-pay | Source: Home / Self Care | Attending: Cardiology

## 2020-05-05 DIAGNOSIS — I2582 Chronic total occlusion of coronary artery: Secondary | ICD-10-CM | POA: Diagnosis not present

## 2020-05-05 DIAGNOSIS — I6523 Occlusion and stenosis of bilateral carotid arteries: Secondary | ICD-10-CM | POA: Insufficient documentation

## 2020-05-05 DIAGNOSIS — R9439 Abnormal result of other cardiovascular function study: Secondary | ICD-10-CM | POA: Diagnosis present

## 2020-05-05 DIAGNOSIS — I2510 Atherosclerotic heart disease of native coronary artery without angina pectoris: Secondary | ICD-10-CM | POA: Insufficient documentation

## 2020-05-05 DIAGNOSIS — F419 Anxiety disorder, unspecified: Secondary | ICD-10-CM | POA: Diagnosis not present

## 2020-05-05 DIAGNOSIS — Z87891 Personal history of nicotine dependence: Secondary | ICD-10-CM | POA: Diagnosis not present

## 2020-05-05 DIAGNOSIS — Z7902 Long term (current) use of antithrombotics/antiplatelets: Secondary | ICD-10-CM | POA: Insufficient documentation

## 2020-05-05 DIAGNOSIS — E039 Hypothyroidism, unspecified: Secondary | ICD-10-CM | POA: Insufficient documentation

## 2020-05-05 DIAGNOSIS — R011 Cardiac murmur, unspecified: Secondary | ICD-10-CM | POA: Insufficient documentation

## 2020-05-05 DIAGNOSIS — Z85818 Personal history of malignant neoplasm of other sites of lip, oral cavity, and pharynx: Secondary | ICD-10-CM | POA: Insufficient documentation

## 2020-05-05 DIAGNOSIS — K219 Gastro-esophageal reflux disease without esophagitis: Secondary | ICD-10-CM | POA: Diagnosis not present

## 2020-05-05 DIAGNOSIS — Z8611 Personal history of tuberculosis: Secondary | ICD-10-CM | POA: Insufficient documentation

## 2020-05-05 DIAGNOSIS — I129 Hypertensive chronic kidney disease with stage 1 through stage 4 chronic kidney disease, or unspecified chronic kidney disease: Secondary | ICD-10-CM | POA: Insufficient documentation

## 2020-05-05 DIAGNOSIS — E1122 Type 2 diabetes mellitus with diabetic chronic kidney disease: Secondary | ICD-10-CM | POA: Insufficient documentation

## 2020-05-05 DIAGNOSIS — Z21 Asymptomatic human immunodeficiency virus [HIV] infection status: Secondary | ICD-10-CM | POA: Diagnosis not present

## 2020-05-05 DIAGNOSIS — G709 Myoneural disorder, unspecified: Secondary | ICD-10-CM | POA: Diagnosis not present

## 2020-05-05 DIAGNOSIS — Z79899 Other long term (current) drug therapy: Secondary | ICD-10-CM | POA: Insufficient documentation

## 2020-05-05 DIAGNOSIS — N189 Chronic kidney disease, unspecified: Secondary | ICD-10-CM | POA: Diagnosis not present

## 2020-05-05 DIAGNOSIS — E1165 Type 2 diabetes mellitus with hyperglycemia: Secondary | ICD-10-CM | POA: Insufficient documentation

## 2020-05-05 DIAGNOSIS — E785 Hyperlipidemia, unspecified: Secondary | ICD-10-CM | POA: Insufficient documentation

## 2020-05-05 DIAGNOSIS — Z794 Long term (current) use of insulin: Secondary | ICD-10-CM | POA: Insufficient documentation

## 2020-05-05 DIAGNOSIS — Z7982 Long term (current) use of aspirin: Secondary | ICD-10-CM | POA: Insufficient documentation

## 2020-05-05 DIAGNOSIS — Z79890 Hormone replacement therapy (postmenopausal): Secondary | ICD-10-CM | POA: Insufficient documentation

## 2020-05-05 HISTORY — PX: CORONARY ANGIOGRAPHY: CATH118303

## 2020-05-05 LAB — GLUCOSE, CAPILLARY: Glucose-Capillary: 123 mg/dL — ABNORMAL HIGH (ref 70–99)

## 2020-05-05 SURGERY — CORONARY ANGIOGRAPHY (CATH LAB)
Anesthesia: LOCAL

## 2020-05-05 MED ORDER — VERAPAMIL HCL 2.5 MG/ML IV SOLN: INTRAVENOUS | Status: DC | PRN

## 2020-05-05 MED ORDER — SODIUM CHLORIDE 0.9 % WEIGHT BASED INFUSION: 1.0000 mL/kg/h | INTRAVENOUS | Status: DC

## 2020-05-05 MED ORDER — SODIUM CHLORIDE 0.9 % WEIGHT BASED INFUSION
3.0000 mL/kg/h | INTRAVENOUS | Status: AC
  Administered 2020-05-05: 3 mL/kg/h via INTRAVENOUS

## 2020-05-05 MED ORDER — LIDOCAINE HCL (PF) 1 % IJ SOLN
INTRAMUSCULAR | Status: DC | PRN
  Administered 2020-05-05: 2 mL

## 2020-05-05 MED ORDER — SODIUM CHLORIDE 0.9 % IV SOLN: 250.0000 mL | INTRAVENOUS | Status: DC | PRN

## 2020-05-05 MED ORDER — HEPARIN (PORCINE) IN NACL 1000-0.9 UT/500ML-% IV SOLN
INTRAVENOUS | Status: AC
  Filled 2020-05-05: qty 1000

## 2020-05-05 MED ORDER — HEPARIN (PORCINE) IN NACL 1000-0.9 UT/500ML-% IV SOLN
INTRAVENOUS | Status: DC | PRN
  Administered 2020-05-05 (×2): 500 mL

## 2020-05-05 MED ORDER — SODIUM CHLORIDE 0.9 % IV SOLN: INTRAVENOUS | Status: AC

## 2020-05-05 MED ORDER — ASPIRIN 81 MG PO CHEW: 81.0000 mg | CHEWABLE_TABLET | ORAL | Status: DC

## 2020-05-05 MED ORDER — SODIUM CHLORIDE 0.9% FLUSH: 3.0000 mL | Freq: Two times a day (BID) | INTRAVENOUS | Status: DC

## 2020-05-05 MED ORDER — SODIUM CHLORIDE 0.9% FLUSH: 3.0000 mL | INTRAVENOUS | Status: DC | PRN

## 2020-05-05 MED ORDER — ACETAMINOPHEN 325 MG PO TABS: 650.0000 mg | ORAL_TABLET | ORAL | Status: DC | PRN

## 2020-05-05 MED ORDER — FENTANYL CITRATE (PF) 100 MCG/2ML IJ SOLN
INTRAMUSCULAR | Status: DC | PRN
  Administered 2020-05-05: 50 ug via INTRAVENOUS

## 2020-05-05 MED ORDER — HEPARIN SODIUM (PORCINE) 1000 UNIT/ML IJ SOLN
INTRAMUSCULAR | Status: DC | PRN
  Administered 2020-05-05: 4000 [IU] via INTRAVENOUS

## 2020-05-05 MED ORDER — MIDAZOLAM HCL 2 MG/2ML IJ SOLN
INTRAMUSCULAR | Status: AC
  Filled 2020-05-05: qty 2

## 2020-05-05 MED ORDER — MIDAZOLAM HCL 2 MG/2ML IJ SOLN
INTRAMUSCULAR | Status: DC | PRN
  Administered 2020-05-05: 1 mg via INTRAVENOUS

## 2020-05-05 MED ORDER — VERAPAMIL HCL 2.5 MG/ML IV SOLN
INTRAVENOUS | Status: AC
  Filled 2020-05-05: qty 2

## 2020-05-05 MED ORDER — NITROGLYCERIN 1 MG/10 ML FOR IR/CATH LAB
INTRA_ARTERIAL | Status: AC
  Filled 2020-05-05: qty 10

## 2020-05-05 MED ORDER — ONDANSETRON HCL 4 MG/2ML IJ SOLN: 4.0000 mg | Freq: Four times a day (QID) | INTRAMUSCULAR | Status: DC | PRN

## 2020-05-05 MED ORDER — FENTANYL CITRATE (PF) 100 MCG/2ML IJ SOLN
INTRAMUSCULAR | Status: AC
  Filled 2020-05-05: qty 2

## 2020-05-05 MED ORDER — HYDRALAZINE HCL 20 MG/ML IJ SOLN: 10.0000 mg | INTRAMUSCULAR | Status: DC | PRN

## 2020-05-05 MED ORDER — IOHEXOL 350 MG/ML SOLN
INTRAVENOUS | Status: DC | PRN
  Administered 2020-05-05: 20 mL via INTRA_ARTERIAL

## 2020-05-05 MED ORDER — LABETALOL HCL 5 MG/ML IV SOLN: 10.0000 mg | INTRAVENOUS | Status: DC | PRN

## 2020-05-05 MED ORDER — LIDOCAINE HCL (PF) 1 % IJ SOLN
INTRAMUSCULAR | Status: AC
  Filled 2020-05-05: qty 30

## 2020-05-05 SURGICAL SUPPLY — 10 items
CATH INFINITI 5 FR JL3.5 (CATHETERS) ×2 IMPLANT
CATH OPTITORQUE TIG 4.0 5F (CATHETERS) ×2 IMPLANT
DEVICE RAD COMP TR BAND LRG (VASCULAR PRODUCTS) ×2 IMPLANT
GLIDESHEATH SLEND A-KIT 6F 22G (SHEATH) ×2 IMPLANT
GUIDEWIRE INQWIRE 1.5J.035X260 (WIRE) ×1 IMPLANT
INQWIRE 1.5J .035X260CM (WIRE) ×2
KIT HEART LEFT (KITS) ×2 IMPLANT
PACK CARDIAC CATHETERIZATION (CUSTOM PROCEDURE TRAY) ×2 IMPLANT
TRANSDUCER W/STOPCOCK (MISCELLANEOUS) ×2 IMPLANT
TUBING CIL FLEX 10 FLL-RA (TUBING) ×2 IMPLANT

## 2020-05-05 NOTE — Interval H&P Note (Signed)
History and Physical Interval Note:  05/05/2020 2:40 PM  Christopher [name]  has presented today for surgery, with the diagnosis of abnormal stress test.  The various methods of treatment have been discussed with the patient and family. After consideration of risks, benefits and other options for treatment, the patient has consented to  Procedure(s): LEFT HEART CATH AND CORONARY ANGIOGRAPHY (N/A) as a surgical intervention.  The patient's history has been reviewed, patient examined, no change in status, stable for surgery.  I have reviewed the patient's chart and labs.  Questions were answered to the patient's satisfaction.     2016/2017 Appropriate Use Criteria for Coronary Revascularization Clinical Presentation: Diabetes Mellitus? Symptom Status? S/P CABG? Antianginal Therapy (# of long-acting drugs)? Results of Non-invasive testing? FFR/iFR results in all diseased vessels? Patient undergoing renal transplant? Patient undergoing percutaneous valve procedure (TAVR, MitraClip, Others)? Symptom Status:  Ischemic Symptoms  Non-invasive Testing:  Intermediate Risk  If no or indeterminate stress test, FFR/iFR results in all diseased vessels:  N/A  Diabetes Mellitus:  Yes  S/P CABG:  No  Antianginal therapy (number of long-acting drugs):  1  Patient undergoing renal transplant:  No  Patient undergoing percutaneous valve procedure:  No    newline 1 Vessel Disease PCI CABG  No proximal LAD involvement, No proximal left dominant LCX involvement M (6); Indication 2 M (4); Indication 2   Proximal left dominant LCX involvement A (7); Indication 5 A (7); Indication 5   Proximal LAD involvement A (7); Indication 5 A (7); Indication 5   newline 2 Vessel Disease  No proximal LAD involvement A (7); Indication 8 M (6); Indication 8   Proximal LAD involvement A (7); Indication 14 A (8); Indication 14   newline 3 Vessel Disease  Low disease complexity (e.g., focal stenoses, SYNTAX <=22) A (7);  Indication 19 A (8); Indication 19   Intermediate or high disease complexity (e.g., SYNTAX >=23) M (5); Indication 23 A (8); Indication 23   newline Left Main Disease  Isolated LMCA disease: ostial or midshaft A (7); Indication 24 A (9); Indication 24   Isolated LMCA disease: bifurcation involvement M (5); Indication 25 A (9); Indication 25   LMCA ostial or midshaft, concurrent low disease burden multivessel disease (e.g., 1-2 additional focal stenoses, SYNTAX <=22) A (7); Indication 26 A (9); Indication 26   LMCA ostial or midshaft, concurrent intermediate or high disease burden multivessel disease (e.g., 1-2 additional bifurcation stenoses, long stenoses, SYNTAX >=23) M (4); Indication 27 A (9); Indication 27   LMCA bifurcation involvement, concurrent low disease burden multivessel disease (e.g., 1-2 additional focal stenoses, SYNTAX <=22) M (5); Indication 28 A (9); Indication 28   LMCA bifurcation involvement, concurrent intermediate or high disease burden multivessel disease (e.g., 1-2 additional bifurcation stenoses, long stenoses, SYNTAX >=23) R (3); Indication 29 A (9); Indication 29     Notes: A indicates appropriate. M indicates may be appropriate. R indicates rarely appropriate. Number in parentheses is median score for that indication. Reclassify indicates number of functionally diseased vessels should be decreased given negative FFR/iFR. Re-evaluate the scenario interpreting any FFR/iFR negative vessel as being not significantly stenosed. Disease means involved vessel provides flow to a sufficient amount of myocardium to be clinically important. If FFR testing indicates a vessel is not significant, that vessel should not be considered diseased (and the patient should be reclassified with respect to extent of functionally significant disease). Proximal LAD + proximal left dominant LCX is considered 3 vessel CAD 2 Vessel  CAD with FFR/iFR abnormal in only 1 but not both is considered 1  vessel CAD Disease complexity includes occlusion, bifurcation, trifurcation, ostial, >20 mm, tortuosity, calcification, thrombus LMCA disease is >=50% by angiography, MLD <2.8 mm, MLA <6 mm2; MLA 6-7.5 mm2 requires further physiologic See Table B for risk stratification based on noninvasive testing   Journal of the [employer] of Cardiology Mar 2017, 23391; DOI: 10.1016/j.jacc.2017.02.001 [email or web address].full-text.pdf  [employer]

## 2020-05-05 NOTE — Discharge Instructions (Signed)
Radial Site Care  This sheet gives you information about how to care for yourself after your procedure. Your health care provider may also give you more specific instructions. If you have problems or questions, contact your health care provider. What can I expect after the procedure? After the procedure, it is common to have:  Bruising and tenderness at the catheter insertion area. Follow these instructions at home: Medicines  Take over-the-counter and prescription medicines only as told by your health care provider. Insertion site care  Follow instructions from your health care provider about how to take care of your insertion site. Make sure you: ? Wash your hands with soap and water before you change your bandage (dressing). If soap and water are not available, use hand sanitizer. ? Change your dressing as told by your health care provider. ? Leave stitches (sutures), skin glue, or adhesive strips in place. These skin closures may need to stay in place for 2 weeks or longer. If adhesive strip edges start to loosen and curl up, you may trim the loose edges. Do not remove adhesive strips completely unless your health care provider tells you to do that.  Check your insertion site every day for signs of infection. Check for: ? Redness, swelling, or pain. ? Fluid or blood. ? Pus or a bad smell. ? Warmth.  Do not take baths, swim, or use a hot tub until your health care provider approves.  You may shower 24-48 hours after the procedure, or as directed by your health care provider. ? Remove the dressing and gently wash the site with plain soap and water. ? Pat the area dry with a clean towel. ? Do not rub the site. That could cause bleeding.  Do not apply powder or lotion to the site. Activity   For 24 hours after the procedure, or as directed by your health care provider: ? Do not flex or bend the affected arm. ? Do not push or pull heavy objects with the affected arm. ? Do not  drive yourself home from the hospital or clinic. You may drive 24 hours after the procedure unless your health care provider tells you not to. ? Do not operate machinery or power tools.  Do not lift anything that is heavier than 10 lb (4.5 kg), or the limit that you are told, until your health care provider says that it is safe.  Ask your health care provider when it is okay to: ? Return to work or school. ? Resume usual physical activities or sports. ? Resume sexual activity. General instructions  If the catheter site starts to bleed, raise your arm and put firm pressure on the site. If the bleeding does not stop, get help right away. This is a medical emergency.  If you went home on the same day as your procedure, a responsible adult should be with you for the first 24 hours after you arrive home.  Keep all follow-up visits as told by your health care provider. This is important. Contact a health care provider if:  You have a fever.  You have redness, swelling, or yellow drainage around your insertion site. Get help right away if:  You have unusual pain at the radial site.  The catheter insertion area swells very fast.  The insertion area is bleeding, and the bleeding does not stop when you hold steady pressure on the area.  Your arm or hand becomes pale, cool, tingly, or numb. These symptoms may represent a serious problem   that is an emergency. Do not wait to see if the symptoms will go away. Get medical help right away. Call your local emergency services (911 in the [place] Do not drive yourself to the hospital. Summary  After the procedure, it is common to have bruising and tenderness at the site.  Follow instructions from your health care provider about how to take care of your radial site wound. Check the wound every day for signs of infection.  Do not lift anything that is heavier than 10 lb (4.5 kg), or the limit that you are told, until your health care provider says  that it is safe. This information is not intended to replace advice given to you by your health care provider. Make sure you discuss any questions you have with your health care provider. Document Revised: 12/13/2017 Document Reviewed: 12/13/2017 Elsevier Patient Education  2020 [employer]  

## 2020-05-05 NOTE — H&P (Signed)
OV 6/10 copied for documentation    Christopher [name] Date of Birth: 1951-03-21 MRN: [id number] Primary Care Provider:Polite, Jori Moll, MD Primary Cardiologist: Rex Kras, DO, [facility] (established care 04/15/2020)  Date: 04/30/20 Last Visit: Visit date not found       Chief Complaint  Patient presents with  . Heart Murmur    clearance   . Follow-up    HPI  Christopher [name] is a 69 y.o.  male who presents to the office with a chief complaint of "pre-procedural risk stratification." Patient's past medical history and cardiovascular risk factors include: Newly diagnosed diabetes mellitus type 2 anxiety, GERD, HIV, hypertension, hyperlipidemia, neuromuscular disorder, hypothyroidism, history of tonsillar cancer, history of TB, advanced age.  Patient is accompanied by his partner Christopher [name] at today's office visit.  He was last seen in a hospital for preoperative risk stratification back in May 2021.   In May 2021 patient presented to the hospital after sustaining a fall and had a scalp laceration.  During the hospitalization he was diagnosed with insulin-dependent diabetes with blood sugars on admission being 1400 and hemoglobin A1c 12.9.  Patient had CTA of the head and neck which noted bilateral carotid artery stenosis and cardiology was consulted for preoperative risk stratification.  The plan was to do as an outpatient and he was discharged home.  He was scheduled for outpatient echo and stress test.  He now presents to the office for preprocedural stratification for upcoming right TCAR carotid stent on May 18, 2020 and subsequently will undergo left TCAR carotid stent after he is recovered from initial procedure.  He is currently being followed by Dr. Oneida Alar.  Patient was informed that his echocardiogram noted preserved left ventricular systolic function without any significant valvular heart disease.  However, he is noted to have fixed perfusion defect involving the RCA with peri-infarct  ischemia.  Nuclear stress test images were independently reviewed and also discussed with the patient's partner Christopher [name] at today's visit.  Patient currently is not having active chest pain at rest or with effort related activities.  Patient is not very functionally active at baseline.  Clinically he is not in congestive heart failure.  History of insulin-dependent diabetes mellitus.  Denies prior history of coronary artery disease, myocardial infarction, congestive heart failure, deep venous thrombosis, pulmonary embolism, stroke, transient ischemic attack.  FUNCTIONAL STATUS: No structured exercise program or daily routine.  But is able to his activities of daily living.  ALLERGIES:      Allergies  Allergen Reactions  . Codeine Other (See Comments)    "makes me crazy"  . Sulfamethoxazole-Trimethoprim Itching and Other (See Comments)    "makes me crazy"  . Sulfonamide Derivatives Itching and Other (See Comments)    "makes me crazy"     MEDICATION LIST PRIOR TO VISIT:       Current Outpatient Medications on File Prior to Visit  Medication Sig Dispense Refill  . aspirin EC 81 MG EC tablet Take 1 tablet (81 mg total) by mouth daily. 30 tablet 0  . benazepril (LOTENSIN) 10 MG tablet Take 10 mg by mouth daily.    . blood glucose meter kit and supplies Dispense based on patient and insurance preference. Use up to four times daily as directed. (FOR ICD-10 E10.9, E11.9). 1 each 0  . clonazePAM (KLONOPIN) 1 MG tablet TAKE 1 TABLET 3 TIMES A DAY AS NEEDED FOR ANXIETY (Patient taking differently: Take 1 mg by mouth 3 (three) times daily as needed for anxiety. ) 90  tablet 2  . clopidogrel (PLAVIX) 75 MG tablet Take 1 tablet (75 mg total) by mouth daily. 30 tablet 0  . diphenhydrAMINE (BENADRYL) 25 MG tablet Take 50 mg by mouth daily.     . feeding supplement, GLUCERNA SHAKE, (GLUCERNA SHAKE) LIQD Take 237 mLs by mouth 3 (three) times daily between meals. (Patient taking differently:  Take 237 mLs by mouth daily as needed (nutrition). ) 21330 mL 0  . FLUoxetine (PROZAC) 20 MG capsule TAKE ONE CAPSULE BY MOUTH DAILY (Patient taking differently: Take 20 mg by mouth daily. ) 90 capsule 0  . folic acid (FOLVITE) 1 MG tablet Take 2 tablets (2 mg total) by mouth daily. (Patient taking differently: Take 1 mg by mouth daily. ) 60 tablet 0  . insulin aspart (NOVOLOG FLEXPEN) 100 UNIT/ML FlexPen 0-15 Units, Subcutaneous, 3 times daily with meals CBG < 70: implement hypoglycemia protocol-call MD CBG 70 - 120: 0 units CBG 121 - 150: 2 units CBG 151 - 200: 3 units CBG 201 - 250: 5 units CBG 251 - 300: 8 units CBG 301 - 350: 11 units CBG 351 - 400: 15 units CBG > 400: (Patient not taking: Reported on 04/30/2020) 15 mL 0  . insulin glargine (LANTUS) 100 UNIT/ML Solostar Pen Inject 26 Units into the skin daily at 10 pm. 15 mL 0  . Insulin Pen Needle 32G X 8 MM MISC Use as directed 100 each 0  . levothyroxine (SYNTHROID, LEVOTHROID) 50 MCG tablet Take 1 tablet (50 mcg total) by mouth daily before breakfast. 30 tablet 0  . metoprolol succinate (TOPROL-XL) 25 MG 24 hr tablet Take 1 tablet (25 mg total) by mouth daily. 30 tablet 0  . rosuvastatin (CRESTOR) 20 MG tablet Take 1 tablet (20 mg total) by mouth daily. 30 tablet 0  . temazepam (RESTORIL) 30 MG capsule Take 30 mg by mouth at bedtime.  5  . traMADol (ULTRAM) 50 MG tablet Take 50 mg by mouth every 6 (six) hours as needed for moderate pain.     . TRIUMEQ 600-50-300 MG tablet TAKE ONE TABLET BY MOUTH DAILY (Patient taking differently: Take 1 tablet by mouth daily. ) 90 tablet 3  . [DISCONTINUED] Dolutegravir Sodium (TIVICAY) 50 MG TABS Take 1 tablet (50 mg total) by mouth daily. 30 tablet 11   No current facility-administered medications on file prior to visit.    PAST MEDICAL HISTORY:     Past Medical History:  Diagnosis Date  . Anxiety   . Cataract   . Chronic kidney disease   . GERD (gastroesophageal reflux disease)    . Heart murmur   . HIV DISEASE 12/01/2006  . HYPERLIPIDEMIA, WITH LOW HDL 01/03/2008  . Hypertension   . HYPERTENSION 12/01/2006  . HYPOGONADISM 12/23/2009  . Neuromuscular disorder ([place])   . SYPHILIS, [place], LATENT NOS 01/18/2007  . Thyroid disease   . Tonsillar cancer ([place])    tonsillar ca   . Tuberculosis    history of TB about 30 years ago    PAST SURGICAL HISTORY: Past Surgical History:  Procedure Laterality Date  . COLONOSCOPY    . TONSILECTOMY, ADENOIDECTOMY, BILATERAL MYRINGOTOMY AND TUBES     tonsil cancer   . UPPER GASTROINTESTINAL ENDOSCOPY      FAMILY HISTORY: The patient's family history includes COPD in his mother; Diabetes in his mother; Heart attack in his father.   SOCIAL HISTORY:  The patient  reports that he has quit smoking. He has never used smokeless tobacco. He reports  that he does not drink alcohol and does not use drugs.  Review of Systems  Constitutional: Negative for chills and fever.  HENT: Negative for congestion and nosebleeds.   Eyes: Negative for discharge, double vision and pain.  Cardiovascular: Negative for chest pain, claudication, dyspnea on exertion, leg swelling, near-syncope, orthopnea, palpitations, paroxysmal nocturnal dyspnea and syncope.  Respiratory: Negative for hemoptysis and shortness of breath.   Musculoskeletal: Negative for muscle cramps and myalgias.  Gastrointestinal: Negative for abdominal pain, constipation, diarrhea, hematemesis, hematochezia, melena, nausea and vomiting.  Neurological: Negative for dizziness and light-headedness.    PHYSICAL EXAM: Vitals with BMI 04/30/2020 04/30/2020 04/30/2020  Height _0  - _1   Weight 145 lbs - 144 lbs  BMI 71.06 - 26.94  Systolic 854 627 035  Diastolic 63 62 67  Pulse 52 - 18    CONSTITUTIONAL:Frail gentleman, appears older than stated age, hemodynamically stable.No acute distress.  SKIN: Skin is warm and dry. No rash noted. No cyanosis. No  pallor. No jaundice HEAD:Staples present over the left lateral cranium. Dry blood noted. EYES: No scleral icterus MOUTH/THROAT: Moist oral membranes.Poor oral dentition. NECK: No JVD present. No thyromegaly noted.Bilateralcarotid bruits  LYMPHATIC: No visible cervical adenopathy.  CHEST Normal respiratory effort. No intercostal retractions  LUNGS:Clear to auscultation bilaterally.No stridor. No wheezes. No rales.  CARDIOVASCULAR:Regular rate and rhythm, positive K0-X3,GHWE systolic ejection murmur, norubs or gallop. ABDOMINAL: No apparent ascites.  EXTREMITIES: No peripheral edema  HEMATOLOGIC: No significant bruising NEUROLOGIC: Oriented to person, place, and time. Nonfocal. Normal muscle tone.  PSYCHIATRIC: Normal mood and affect. Normal behavior. Cooperative  RADIOLOGY: CTA Head and Neck 04/14/2020: 1. 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 2. High-grade, near occlusive, stenosis of the left internal carotid artery 3 cm from the bifurcation. The distal left ICA is the more normal vessel. 3. Moderate proximal left vertebral artery stenosis. 4. Moderate left vertebral artery stenosis at the dural margin just after the left PICA originates. 5. 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 6. Moderate narrowing of the distal right P2 segment. 7. Mild generalized atrophy and white matter disease is stable. 8. 4 mm meningioma posteriorly. 9. No extra-axial hemorrhage. 10. Right temporoparietal scalp soft tissue swelling is improving. 11. Aortic Atherosclerosis (ICD10-I70.0).  CARDIAC DATABASE: EKG: 04/30/2020: Sinus bradycardia, 52 bpm, right bundle branch block, without underlying injury pattern.  Echocardiogram: 04/21/2020: LVEF 55 to 60%, mild LVH, normal diastolic filling pattern, mild MR.  Stress Testing: Carlton Adam (Walking with mod Bruce)Tetrofosmin Stress Test   04/28/2020: Myocardial perfusion is abnormal. Mild degree medium extent perfusion defect consistent with scar with superimposed moderate ischemia located in the apical inferior wall, mid inferior wall and basal inferior wall (RCAregion) of left ventricle. Gated SPECT imaging of the left ventricle demonstrating hypokinesis of the apical inferior wall, mid inferior wall and basal inferior wall.  Stress LV EF is normal 58%.  No previous exam available for comparison. Intermediate risk study  Heart Catheterization: None  Carotid duplex: 04/13/2020:Right Carotid: Velocities in the right ICA are consistent with a 80-99% stenosis.  Left Carotid: Velocities in the left ICA are consistent with a 1-39% stenosis. Calcific plaque could obscure higher velocities.  Vertebrals: Bilateral vertebral arteries demonstrate antegrade flow.  Subclavians: Left subclavian artery was not visualized. Normal flow hemodynamics were seen in the right subclavian artery.   LABORATORY DATA: CBC Latest Ref Rng & Units 04/16/2020 04/15/2020 04/13/2020  WBC 4.0 - 10.5 K/uL 4.1  3.7(L) -  Hemoglobin 13.0 - 17.0 g/dL 11.6(L) 10.9(L) 13.6  Hematocrit 39 - 52 % 34.1(L) 31.8(L) 40.0  Platelets 150 - 400 K/uL 186 149(L) -    CMP Latest Ref Rng & Units 04/16/2020 04/15/2020 04/14/2020  Glucose 70 - 99 mg/dL 258(H) 251(H) 290(H)  BUN 8 - 23 mg/dL _0 Creatinine 0.61 - 1.24 mg/dL 1.01 1.04 1.11  Sodium 135 - 145 mmol/L 134(L) 135 135  Potassium 3.5 - 5.1 mmol/L 3.7 3.2(L) 3.7  Chloride 98 - 111 mmol/L 99 100 101  CO2 22 - 32 mmol/L _1 Calcium 8.9 - 10.3 mg/dL 9.4 9.2 8.8(L)  Total Protein 6.5 - 8.1 g/dL 7.5 - -  Total Bilirubin 0.3 - 1.2 mg/dL 0.4 - -  Alkaline Phos 38 - 126 U/L 60 - -  AST 15 - 41 U/L 16 - -  ALT 0 - 44 U/L 14 - -    Lipid Panel  Labs (Brief)          Component Value Date/Time   CHOL 168 04/16/2020 0438   TRIG 278 (H) 04/16/2020 0438   HDL 29 (L) 04/16/2020 0438   CHOLHDL 5.8  04/16/2020 0438   VLDL 56 (H) 04/16/2020 0438   LDLCALC 83 04/16/2020 0438   LDLCALC 105 (H) 09/20/2019 1116      Recent Labs       Lab Results  Component Value Date   HGBA1C 12.9 (H) 04/13/2020   HGBA1C 4.9 01/03/2008     Recent Labs  No components found for: NTPROBNP   Recent Labs       Lab Results  Component Value Date   TSH 2.037 01/07/2013   TSH 0.914 08/29/2012   TSH 0.413 08/23/2012      Cardiac Panel (last 3 results) Recent Labs (last 2 labs)   No results for input(s): CKTOTAL, CKMB, TROPONINIHS, RELINDX in the last 72 hours.    IMPRESSION:    ICD-10-CM   1. Abnormal nuclear stress test  R94.39 EKG 12-Lead    SARS-COV-2 RNA,(COVID-19) QUAL NAAT  2. Preprocedural cardiovascular exam  Z01.810   3. Type 2 diabetes mellitus with hyperglycemia, with long-term current use of insulin (HCC)  E11.65    Z79.4   4. Long-term insulin use (HCC)  Z79.4   5. Bilateral carotid artery stenosis  I65.23   6. RBBB  I45.10   7. HIV infection, unspecified symptom status ([place])  B20   8. Former smoker  Z87.891      RECOMMENDATIONS: Christopher [name] is a 69 y.o. male whose past medical history and cardiovascular risk factors include: Newly diagnosed diabetes mellitus type 2 anxiety, GERD, HIV, hypertension, hyperlipidemia, neuromuscular disorder, hypothyroidism, history of tonsillar cancer, history of TB, advanced age.  Abnormal nuclear stress test:  Patient's nuclear stress test was performed as part of his preprocedure risk stratification as he is not very active at baseline.  Nuclear stress test was reported to be abnormal. There is a fixed perfusion defect in the inferior myocardium suggestive of scar in the RCA distribution but peri-infarct ischemia cannot be ruled out.   Given the fact the patient was recently hospitalized with admitting blood sugars in the 1400s and A1c of 12.9 his pretest probability of having obstructive CAD is  high.  To safely risk stratify him prior to this upcoming procedures he was recommended to proceed with either  left heart catheterization with possible intervention or coronary CTA prior to his elective procedure.  Both  the patient and his partner would like to proceed with left heart catheterization with possible intervention.  The left heart catheterization procedure was explained to the patient and his partner in detail. The indication, alternatives, risks and benefits were reviewed. Complications including but not limited to bleeding, infection, acute kidney injury, blood transfusion, heart rhythm disturbances, contrast (dye) reaction, damage to the arteries or nerves in the legs or hands, cerebrovascular accident, myocardial infarction, need for emergent bypass surgery, blood clots in the legs, possible need for emergent blood transfusion, and rarely death were reviewed and discussed with the patient. The patient and his partner voices understanding and wishes to proceed.   Preprocedural risk stratification forthcoming.  Bilateral carotid artery stenosis, asymptomatic:  Continue aspirin, statin therapy, Plavix.  Currently being followed by Dr. Oneida Alar.  Tentatively scheduled for right TCAR stent on May 18, 2020  Insulin-dependent diabetes mellitus: Currently managed by primary team  Former smoker: Educated on the importance of continued smoking cessation.  FINAL MEDICATION LIST END OF ENCOUNTER: No orders of the defined types were placed in this encounter.       Medications Discontinued During This Encounter  Medication Reason  . pantoprazole (PROTONIX) 40 MG tablet Completed Course  . metoCLOPramide (REGLAN) 5 MG tablet Completed Course     Current Outpatient Medications:  .  aspirin EC 81 MG EC tablet, Take 1 tablet (81 mg total) by mouth daily., Disp: 30 tablet, Rfl: 0 .  benazepril (LOTENSIN) 10 MG tablet, Take 10 mg by mouth daily., Disp: , Rfl:  .  blood glucose  meter kit and supplies, Dispense based on patient and insurance preference. Use up to four times daily as directed. (FOR ICD-10 E10.9, E11.9)., Disp: 1 each, Rfl: 0 .  clonazePAM (KLONOPIN) 1 MG tablet, TAKE 1 TABLET 3 TIMES A DAY AS NEEDED FOR ANXIETY (Patient taking differently: Take 1 mg by mouth 3 (three) times daily as needed for anxiety. ), Disp: 90 tablet, Rfl: 2 .  clopidogrel (PLAVIX) 75 MG tablet, Take 1 tablet (75 mg total) by mouth daily., Disp: 30 tablet, Rfl: 0 .  diphenhydrAMINE (BENADRYL) 25 MG tablet, Take 50 mg by mouth daily. , Disp: , Rfl:  .  feeding supplement, GLUCERNA SHAKE, (GLUCERNA SHAKE) LIQD, Take 237 mLs by mouth 3 (three) times daily between meals. (Patient taking differently: Take 237 mLs by mouth daily as needed (nutrition). ), Disp: 21330 mL, Rfl: 0 .  FLUoxetine (PROZAC) 20 MG capsule, TAKE ONE CAPSULE BY MOUTH DAILY (Patient taking differently: Take 20 mg by mouth daily. ), Disp: 90 capsule, Rfl: 0 .  folic acid (FOLVITE) 1 MG tablet, Take 2 tablets (2 mg total) by mouth daily. (Patient taking differently: Take 1 mg by mouth daily. ), Disp: 60 tablet, Rfl: 0 .  insulin aspart (NOVOLOG FLEXPEN) 100 UNIT/ML FlexPen, 0-15 Units, Subcutaneous, 3 times daily with meals CBG < 70: implement hypoglycemia protocol-call MD CBG 70 - 120: 0 units CBG 121 - 150: 2 units CBG 151 - 200: 3 units CBG 201 - 250: 5 units CBG 251 - 300: 8 units CBG 301 - 350: 11 units CBG 351 - 400: 15 units CBG > 400: (Patient not taking: Reported on 04/30/2020), Disp: 15 mL, Rfl: 0 .  insulin glargine (LANTUS) 100 UNIT/ML Solostar Pen, Inject 26 Units into the skin daily at 10 pm., Disp: 15 mL, Rfl: 0 .  Insulin Pen Needle 32G X 8 MM MISC, Use as directed, Disp: 100 each, Rfl: 0 .  levothyroxine (SYNTHROID,  LEVOTHROID) 50 MCG tablet, Take 1 tablet (50 mcg total) by mouth daily before breakfast., Disp: 30 tablet, Rfl: 0 .  metoprolol succinate (TOPROL-XL) 25 MG 24 hr tablet, Take 1 tablet (25 mg total) by  mouth daily., Disp: 30 tablet, Rfl: 0 .  rosuvastatin (CRESTOR) 20 MG tablet, Take 1 tablet (20 mg total) by mouth daily., Disp: 30 tablet, Rfl: 0 .  temazepam (RESTORIL) 30 MG capsule, Take 30 mg by mouth at bedtime., Disp: , Rfl: 5 .  traMADol (ULTRAM) 50 MG tablet, Take 50 mg by mouth every 6 (six) hours as needed for moderate pain. , Disp: , Rfl:  .  TRIUMEQ 600-50-300 MG tablet, TAKE ONE TABLET BY MOUTH DAILY (Patient taking differently: Take 1 tablet by mouth daily. ), Disp: 90 tablet, Rfl: 3 .  fenofibrate (TRICOR) 145 MG tablet, Take 145 mg by mouth daily., Disp: , Rfl:  .  HUMALOG KWIKPEN 100 UNIT/ML KwikPen, Inject 1-5 Units into the skin 3 (three) times daily before meals., Disp: , Rfl:  .  [DISCONTINUED] Dolutegravir Sodium (TIVICAY) 50 MG TABS, Take 1 tablet (50 mg total) by mouth daily., Disp: 30 tablet, Rfl: 11     Orders Placed This Encounter  Procedures  . SARS-COV-2 RNA,(COVID-19) QUAL NAAT  . EKG 12-Lead   --Continue cardiac medications as reconciled in final medication list. --Return in about 4 weeks (around 05/28/2020) for post heart catheterization  follow up.. Or sooner if needed. --Continue follow-up with your primary care physician regarding the management of your other chronic comorbid conditions.  Patient's questions and concerns were addressed to his satisfaction. He voices understanding of the instructions provided during this encounter.   This note was created using a voice recognition software as a result there may be grammatical errors inadvertently enclosed that do not reflect the nature of this encounter. Every attempt is made to correct such errors.  Rex Kras, [place], [facility]  Pager: [phone] Office: [phone]

## 2020-05-06 ENCOUNTER — Encounter (HOSPITAL_COMMUNITY): Payer: Self-pay | Admitting: Cardiology

## 2020-05-06 MED FILL — Nitroglycerin IV Soln 100 MCG/ML in D5W: INTRA_ARTERIAL | Qty: 10 | Status: AC

## 2020-05-12 NOTE — Pre-Procedure Instructions (Signed)
[address] [place] [address] [place] [facility] [address] [place] [address] Phone: [phone] Fax: [phone]     Your procedure is scheduled on Monday June 28th.  Report to [facility] Main Entrance "A" at 5:30 A.M., and check in at the Admitting office.  Call this number if you have problems the morning of surgery:  [phone]  Call [phone] if you have any questions prior to your surgery date Monday-Friday 8am-4pm    Remember:  Do not eat or drink after midnight the night before your surgery   Take these medicines the morning of surgery with A SIP OF WATER  FLUoxetine (PROZAC) levothyroxine (SYNTHROID, LEVOTHROID) metoprolol succinate (TOPROL-XL) TRIUMEQ    HOW TO MANAGE YOUR DIABETES BEFORE AND AFTER SURGERY  Why is it important to control my blood sugar before and after surgery? . Improving blood sugar levels before and after surgery helps healing and can limit problems. . A way of improving blood sugar control is eating a healthy diet by: o  Eating less sugar and carbohydrates o  Increasing activity/exercise o  Talking with your doctor about reaching your blood sugar goals . High blood sugars (greater than 180 mg/dL) can raise your risk of infections and slow your recovery, so you will need to focus on controlling your diabetes during the weeks before surgery. . Make sure that the doctor who takes care of your diabetes knows about your planned surgery including the date and location.  How do I manage my blood sugar before surgery? . Check your blood sugar at least 4 times a day, starting 2 days before surgery, to make sure that the level is not too high or low. o Check your blood sugar the morning of your surgery when you wake up and every 2 hours until you get to the Short Stay unit. . If your blood sugar is less than 70 mg/dL, you will need to treat for low blood sugar: o Do not take insulin. o Treat a low blood sugar (less than 70  mg/dL) with  cup of clear juice (cranberry or apple), 4 glucose tablets, OR glucose gel. Recheck blood sugar in 15 minutes after treatment (to make sure it is greater than 70 mg/dL). If your blood sugar is not greater than 70 mg/dL on recheck, call [phone] o  for further instructions. . Report your blood sugar to the short stay nurse when you get to Short Stay.  . If you are admitted to the hospital after surgery: o Your blood sugar will be checked by the staff and you will probably be given insulin after surgery (instead of oral diabetes medicines) to make sure you have good blood sugar levels. o The goal for blood sugar control after surgery is 80-180 mg/dL.     WHAT DO I DO ABOUT MY DIABETES MEDICATION?   . THE NIGHT BEFORE SURGERY, take 13 units of insulin glargine (LANTUS). Do NOT take evening dose of HUMALOG KWIKPEN or  insulin aspart (NOVOLOG FLEXPEN)    . THE MORNING OF SURGERY, take 1/2 usual dose of insulin aspart (NOVOLOG FLEXPEN) if blood sugar is higher than 220.  Follow your surgeon's instructions on when to stop Asprin and clopidogrel (PLAVIX) .  If no instructions were given by your surgeon then you will need to call the office to get those instructions.     As of today, STOP taking any Aspirin (unless otherwise instructed by your surgeon) and Aspirin containing products, Aleve, Naproxen, Ibuprofen, Motrin,  Advil, Goody's, BC's, all herbal medications, fish oil, and all vitamins.                      Do not wear jewelry, make up, or nail polish            Do not wear lotions, powders, perfumes/colognes, or deodorant.            Do not shave 48 hours prior to surgery.  Men may shave face and neck.            Do not bring valuables to the hospital.            [facility] is not responsible for any belongings or valuables.  Do NOT Smoke (Tobacco/Vapping) or drink Alcohol 24 hours prior to your procedure If you use a CPAP at night, you may bring all equipment for  your overnight stay.   Contacts, glasses, dentures or bridgework may not be worn into surgery.      For patients admitted to the hospital, discharge time will be determined by your treatment team.   Patients discharged the day of surgery will not be allowed to drive home, and someone needs to stay with them for 24 hours.    Special instructions:   [facility]- Preparing For Surgery  Before surgery, you can play an important role. Because skin is not sterile, your skin needs to be as free of germs as possible. You can reduce the number of germs on your skin by washing with CHG (chlorahexidine gluconate) Soap before surgery.  CHG is an antiseptic cleaner which kills germs and bonds with the skin to continue killing germs even after washing.    Oral Hygiene is also important to reduce your risk of infection.  Remember - BRUSH YOUR TEETH THE MORNING OF SURGERY WITH YOUR REGULAR TOOTHPASTE  Please do not use if you have an allergy to CHG or antibacterial soaps. If your skin becomes reddened/irritated stop using the CHG.  Do not shave (including legs and underarms) for at least 48 hours prior to first CHG shower. It is OK to shave your face.  Please follow these instructions carefully.   1. Shower the NIGHT BEFORE SURGERY and the MORNING OF SURGERY with CHG Soap.   2. If you chose to wash your hair, wash your hair first as usual with your normal shampoo.  3. After you shampoo, rinse your hair and body thoroughly to remove the shampoo.  4. Use CHG as you would any other liquid soap. You can apply CHG directly to the skin and wash gently with a scrungie or a clean washcloth.   5. Apply the CHG Soap to your body ONLY FROM THE NECK DOWN.  Do not use on open wounds or open sores. Avoid contact with your eyes, ears, mouth and genitals (private parts). Wash Face and genitals (private parts)  with your normal soap.   6. Wash thoroughly, paying special attention to the area where your surgery will  be performed.  7. Thoroughly rinse your body with warm water from the neck down.  8. DO NOT shower/wash with your normal soap after using and rinsing off the CHG Soap.  9. Pat yourself dry with a CLEAN TOWEL.  10. Wear CLEAN PAJAMAS to bed the night before surgery, wear comfortable clothes the morning of surgery  11. Place CLEAN SHEETS on your bed the night of your first shower and DO NOT SLEEP WITH PETS.   Day of  Surgery:   Do not apply any deodorants/lotions.  Please wear clean clothes to the hospital/surgery center.   Remember to brush your teeth WITH YOUR REGULAR TOOTHPASTE.   Please read over the following fact sheets that you were given.

## 2020-05-13 ENCOUNTER — Encounter (HOSPITAL_COMMUNITY): Payer: Self-pay

## 2020-05-13 ENCOUNTER — Encounter (HOSPITAL_COMMUNITY)
Admission: RE | Admit: 2020-05-13 | Discharge: 2020-05-13 | Disposition: A | Discharge status: Home / Self Care | Payer: PRIVATE HEALTH INSURANCE | Source: Ambulatory Visit | Attending: Vascular Surgery | Admitting: Vascular Surgery

## 2020-05-13 ENCOUNTER — Other Ambulatory Visit: Payer: Self-pay

## 2020-05-13 DIAGNOSIS — Z01812 Encounter for preprocedural laboratory examination: Secondary | ICD-10-CM | POA: Insufficient documentation

## 2020-05-13 HISTORY — DX: Type 2 diabetes mellitus without complications: E11.9

## 2020-05-13 HISTORY — DX: Personal history of urinary calculi: Z87.442

## 2020-05-13 HISTORY — DX: Atherosclerotic heart disease of native coronary artery without angina pectoris: I25.10

## 2020-05-13 HISTORY — DX: Hypothyroidism, unspecified: E03.9

## 2020-05-13 LAB — URINALYSIS, ROUTINE W REFLEX MICROSCOPIC
Bilirubin Urine: NEGATIVE
Glucose, UA: NEGATIVE mg/dL
Hgb urine dipstick: NEGATIVE
Ketones, ur: NEGATIVE mg/dL
Leukocytes,Ua: NEGATIVE
Nitrite: NEGATIVE
Protein, ur: NEGATIVE mg/dL
Specific Gravity, Urine: 1.015 (ref 1.005–1.030)
pH: 7 (ref 5.0–8.0)

## 2020-05-13 LAB — CBC
HCT: 38.2 % — ABNORMAL LOW (ref 39.0–52.0)
Hemoglobin: 12 g/dL — ABNORMAL LOW (ref 13.0–17.0)
MCH: 32.3 pg (ref 26.0–34.0)
MCHC: 31.4 g/dL (ref 30.0–36.0)
MCV: 103 fL — ABNORMAL HIGH (ref 80.0–100.0)
Platelets: 188 10*3/uL (ref 150–400)
RBC: 3.71 MIL/uL — ABNORMAL LOW (ref 4.22–5.81)
RDW: 13.1 % (ref 11.5–15.5)
WBC: 4.1 10*3/uL (ref 4.0–10.5)
nRBC: 0 % (ref 0.0–0.2)

## 2020-05-13 LAB — SURGICAL PCR SCREEN
MRSA, PCR: NEGATIVE
Staphylococcus aureus: POSITIVE — AB

## 2020-05-13 LAB — COMPREHENSIVE METABOLIC PANEL
ALT: 38 U/L (ref 0–44)
AST: 46 U/L — ABNORMAL HIGH (ref 15–41)
Albumin: 4.4 g/dL (ref 3.5–5.0)
Alkaline Phosphatase: 46 U/L (ref 38–126)
Anion gap: 10 (ref 5–15)
BUN: 23 mg/dL (ref 8–23)
CO2: 25 mmol/L (ref 22–32)
Calcium: 10.4 mg/dL — ABNORMAL HIGH (ref 8.9–10.3)
Chloride: 104 mmol/L (ref 98–111)
Creatinine, Ser: 1.47 mg/dL — ABNORMAL HIGH (ref 0.61–1.24)
GFR calc Af Amer: 55 mL/min — ABNORMAL LOW (ref >60)
GFR calc non Af Amer: 48 mL/min — ABNORMAL LOW (ref >60)
Glucose, Bld: 163 mg/dL — ABNORMAL HIGH (ref 70–99)
Potassium: 4.5 mmol/L (ref 3.5–5.1)
Sodium: 139 mmol/L (ref 135–145)
Total Bilirubin: 0.8 mg/dL (ref 0.3–1.2)
Total Protein: 8.8 g/dL — ABNORMAL HIGH (ref 6.5–8.1)

## 2020-05-13 LAB — PROTIME-INR
INR: 1.1 (ref 0.8–1.2)
Prothrombin Time: 14.1 seconds (ref 11.4–15.2)

## 2020-05-13 LAB — APTT: aPTT: 45 seconds — ABNORMAL HIGH (ref 24–36)

## 2020-05-13 LAB — GLUCOSE, CAPILLARY: Glucose-Capillary: 218 mg/dL — ABNORMAL HIGH (ref 70–99)

## 2020-05-13 LAB — ABO/RH: ABO/RH(D): A NEG

## 2020-05-13 NOTE — Progress Notes (Signed)
PCP - [name], MD Cardiologist - Rex Kras, DO  PPM/ICD - Denies  Chest x-ray - 04/13/20 (1-view) EKG - 05/03/20 Stress Test - 04/27/20 ECHO - 04/21/20 Cardiac Cath - Denies  Sleep Study - Denies  Fasting Blood Sugar: ~147 Checks Blood Sugar 3 times a day. CBG at PAT appointment was 218.  Last A1C on 04/13/20 was 12.9  Blood Thinner Instructions: Instructed patient to call Surgeon's office concerning Plavix. Aspirin Instructions: Instructed patient to call Surgeon's office   ERAS Protcol - N/A PRE-SURGERY Ensure or G2- N/A  COVID TEST- 05/14/20   Anesthesia review: Yes, Elevated A1C; review Echo & stress test  Patient denies shortness of breath, fever, cough and chest pain at PAT appointment   All instructions explained to the patient, with a verbal understanding of the material. Patient agrees to go over the instructions while at home for a better understanding. Patient also instructed to self quarantine after being tested for COVID-19. The opportunity to ask questions was provided.

## 2020-05-13 NOTE — Pre-Procedure Instructions (Signed)
[address] [place] [address], [place] [facility] [address] [place] [address] Phone: [phone] Fax: [phone]     Your procedure is scheduled on Monday June 28th.  Report to [facility] Main Entrance "A" at 5:30 A.M., and check in at the Admitting office.  Call this number if you have problems the morning of surgery:  [phone]  Call [phone] if you have any questions prior to your surgery date Monday-Friday 8am-4pm    Remember:  Do not eat or drink after midnight the night before your surgery.   Take these medicines the morning of surgery with A SIP OF WATER:  FLUoxetine (PROZAC) levothyroxine (SYNTHROID, LEVOTHROID) fenofibrate (TRICOR) metoprolol succinate (TOPROL-XL) rosuvastatin (CRESTOR) TRIUMEQ   IF NEEDED: clonazePAM (KLONOPIN) traMADol (ULTRAM)  *Follow your surgeon's instructions on when to stop Asprin and clopidogrel (PLAVIX) .  If no instructions were given by your surgeon then you will need to call the office to get those instructions.     As of today, STOP taking any Aleve, Naproxen, Ibuprofen, Motrin, Advil, Goody's, BC's, all herbal medications, fish oil, and all vitamins.   WHAT DO I DO ABOUT MY DIABETES MEDICATION?  . THE NIGHT BEFORE SURGERY, take 13 units of insulin glargine (LANTUS). Do NOT take evening dose of HUMALOG KWIKPEN or  insulin aspart (NOVOLOG FLEXPEN)    . THE MORNING OF SURGERY, take 1/2 usual dose of insulin aspart (NOVOLOG FLEXPEN) if blood sugar is higher than 220.   HOW TO MANAGE YOUR DIABETES BEFORE AND AFTER SURGERY  Why is it important to control my blood sugar before and after surgery? . Improving blood sugar levels before and after surgery helps healing and can limit problems. . A way of improving blood sugar control is eating a healthy diet by: o  Eating less sugar and carbohydrates o  Increasing activity/exercise o  Talking with your doctor about reaching your blood sugar goals . High  blood sugars (greater than 180 mg/dL) can raise your risk of infections and slow your recovery, so you will need to focus on controlling your diabetes during the weeks before surgery. . Make sure that the doctor who takes care of your diabetes knows about your planned surgery including the date and location.  How do I manage my blood sugar before surgery? . Check your blood sugar at least 4 times a day, starting 2 days before surgery, to make sure that the level is not too high or low. . Check your blood sugar the morning of your surgery when you wake up and every 2 hours until you get to the Short Stay unit. o If your blood sugar is less than 70 mg/dL, you will need to treat for low blood sugar: - Do not take insulin. - Treat a low blood sugar (less than 70 mg/dL) with  cup of clear juice (cranberry or apple), 4 glucose tablets, OR glucose gel. - Recheck blood sugar in 15 minutes after treatment (to make sure it is greater than 70 mg/dL). If your blood sugar is not greater than 70 mg/dL on recheck, call [phone] for further instructions. . Report your blood sugar to the short stay nurse when you get to Short Stay.  . If you are admitted to the hospital after surgery: o Your blood sugar will be checked by the staff and you will probably be given insulin after surgery (instead of oral diabetes medicines) to make sure you have good blood sugar levels. o The goal for blood  sugar control after surgery is 80-180 mg/dL.          The Morning of Surgery:              Do not wear jewelry.            Do not wear lotions, powders,colognes, or deodorant.            Men may shave face and neck.            Do not bring valuables to the hospital.            [facility] is not responsible for any belongings or valuables.  Do NOT Smoke (Tobacco/Vapping) or drink Alcohol 24 hours prior to your procedure.  If you use a CPAP at night, you may bring all equipment for your overnight stay.   Contacts,  glasses, dentures or bridgework may not be worn into surgery.      For patients admitted to the hospital, discharge time will be determined by your treatment team.   Patients discharged the day of surgery will not be allowed to drive home, and someone needs to stay with them for 24 hours.    Special instructions:   [facility]- Preparing For Surgery  Before surgery, you can play an important role. Because skin is not sterile, your skin needs to be as free of germs as possible. You can reduce the number of germs on your skin by washing with CHG (chlorahexidine gluconate) Soap before surgery.  CHG is an antiseptic cleaner which kills germs and bonds with the skin to continue killing germs even after washing.    Oral Hygiene is also important to reduce your risk of infection.  Remember - BRUSH YOUR TEETH THE MORNING OF SURGERY WITH YOUR REGULAR TOOTHPASTE  Please do not use if you have an allergy to CHG or antibacterial soaps. If your skin becomes reddened/irritated stop using the CHG.  Do not shave (including legs and underarms) for at least 48 hours prior to first CHG shower. It is OK to shave your face.  Please follow these instructions carefully.   1. Shower the NIGHT BEFORE SURGERY and the MORNING OF SURGERY with CHG Soap.   2. If you chose to wash your hair, wash your hair first as usual with your normal shampoo.  3. After you shampoo, rinse your hair and body thoroughly to remove the shampoo.  4. Use CHG as you would any other liquid soap. You can apply CHG directly to the skin and wash gently with a scrungie or a clean washcloth.   5. Apply the CHG Soap to your body ONLY FROM THE NECK DOWN.  Do not use on open wounds or open sores. Avoid contact with your eyes, ears, mouth and genitals (private parts). Wash Face and genitals (private parts)  with your normal soap.   6. Wash thoroughly, paying special attention to the area where your surgery will be performed.  7. Thoroughly rinse  your body with warm water from the neck down.  8. DO NOT shower/wash with your normal soap after using and rinsing off the CHG Soap.  9. Pat yourself dry with a CLEAN TOWEL.  10. Wear CLEAN PAJAMAS to bed the night before surgery, wear comfortable clothes the morning of surgery  11. Place CLEAN SHEETS on your bed the night of your first shower and DO NOT SLEEP WITH PETS.   Day of Surgery: Shower with CHG Soap.  Do not apply any deodorants/lotions.  Please  wear clean clothes to the hospital/surgery center.   Remember to brush your teeth WITH YOUR REGULAR TOOTHPASTE.   Please read over the following fact sheets that you were given.

## 2020-05-14 ENCOUNTER — Encounter (HOSPITAL_COMMUNITY): Payer: Self-pay

## 2020-05-14 ENCOUNTER — Other Ambulatory Visit (HOSPITAL_COMMUNITY)
Admission: RE | Admit: 2020-05-14 | Discharge: 2020-05-14 | Disposition: A | Discharge status: Home / Self Care | Payer: PRIVATE HEALTH INSURANCE | Source: Ambulatory Visit | Attending: Vascular Surgery | Admitting: Vascular Surgery

## 2020-05-14 DIAGNOSIS — Z01812 Encounter for preprocedural laboratory examination: Secondary | ICD-10-CM | POA: Insufficient documentation

## 2020-05-14 DIAGNOSIS — Z20822 Contact with and (suspected) exposure to covid-19: Secondary | ICD-10-CM | POA: Diagnosis not present

## 2020-05-14 LAB — SARS CORONAVIRUS 2 (TAT 6-24 HRS): SARS Coronavirus 2: NEGATIVE

## 2020-05-14 NOTE — Anesthesia Preprocedure Evaluation (Addendum)
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 on medications and Pt. on home beta blockers (-) angina+ CAD and + Peripheral Vascular Disease (R CAROTID ARTERY STENOSIS)  (-) Past MI and (-) Cardiac Stents Normal cardiovascular exam Rhythm:Regular Rate:Normal   had cardiac cath following intermediate risk stress test (inferior scar with peri-infarct ischemia) that showed non-obstructive LAD/CX disease with CTO distal RCA with faint collaterals explaining abnormal stress test  Echocardiogram 04/21/2020:  1. Normal LV systolic function with visual EF 55-60%. Left ventricle  cavity is normal in size. Mild left ventricular hypertrophy. Normal global  wall motion. Normal diastolic filling pattern. Calculated EF 55%.  2. Mild (Grade I) mitral regurgitation.  3. No other significant valvular abnormalities.  4. No prior study for comparison.   Neuro/Psych PSYCHIATRIC DISORDERS Anxiety Depression  Neuromuscular disease    GI/Hepatic Neg liver ROS, GERD  ,  Endo/Other  diabetes, Type 2, Insulin DependentHypothyroidism   Renal/GU Renal InsufficiencyRenal disease     Musculoskeletal negative musculoskeletal ROS (+)   Abdominal   Peds  Hematology  (+) Blood dyscrasia (Plavix), , HIV,   Anesthesia Other Findings Day of surgery medications reviewed with the patient.   Reproductive/Obstetrics                            Anesthesia Physical Anesthesia Plan  ASA: IV  Anesthesia Plan: General   Post-op Pain Management:    Induction: Intravenous  PONV Risk Score and Plan: 3 and Dexamethasone, Ondansetron and Treatment may vary due to age or medical condition  Airway Management Planned: Oral ETT  Additional Equipment: Arterial line  Intra-op Plan:   Post-operative Plan: Extubation in OR  Informed Consent:   Plan Discussed with:   Anesthesia Plan Comments: (PAT note written 05/14/2020 by Myra Gianotti, PA-C.  2nd large bore PIV )       Anesthesia Quick Evaluation

## 2020-05-14 NOTE — Progress Notes (Signed)
Anesthesia Chart Review:  Case: [id number] Date/Time: 05/18/20 0715   Procedure: TRANSCAROTID ARTERY REVASCULARIZATION (Right )   Anesthesia type: General   Pre-op diagnosis: CAROTID ARTERY STENOSIS   Location: [facility] OR ROOM 53 / [facility]   Surgeons: Elam Dutch, MD      DISCUSSION: Patient is a 69 year old male scheduled for the above procedure.  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   Hospitalized 04/13/20-04/16/20 after falling at home and striking the right side of his head and required staple closure in the ED. Labs showed a significantly elevated glucose of 1400 (new diagnosis, started on insulin) and Creatinine elevated at 1.9 (AKI on CKD).  Head CT showed trace amount of right hyperdense parafalcine thickening likely due to calcification and right temporoparietal region hematoma. Follow-up head CT was stable. CT c-spine showed no acute fracture but extensive carotid atherosclerosis. Carotid US showed 80-99% RICA stenosis and vascular surgeon consulted. Given history of right neck radiation and TCAR ultimately recommended. Preoperative cardiology evaluation per Dr. Terri Skains and had out-patient work-up. Ultimately had cardiac cath following intermediate risk stress test (inferior scar with peri-infarct ischemia) that showed non-obstructive LAD/CX disease with CTO distal RCA with faint collaterals explaining abnormal stress test. Maximize medical therapy recommended, and "Okay to proceed with TCAR with acceptable moderate MACCE risk" per Dr. Virgina Jock.   As above, newly diagnosed with diabetes on 04/13/20 with A1c 12.9%. He is now on Humalog 1-5 Units TID with meals and  Lantus 26 units Q HS and reported fasting CBGs run ~ 147. He will get a fasting CBG on the day of surgery.   He denied shortness of breath, cough, fever, chest pain at PAT RN visit.  05/14/2020 presurgical COVID-19 test is in process.  Anesthesia team to evaluate on the day of surgery.   VS: BP 114/62    Pulse 67    Temp 36.9 C (Oral)    Resp 17    Ht 5' 10"  (1.778 m)    Wt 67 kg    SpO2 97%    BMI 21.18 kg/m    PROVIDERS: [name], MD is PCP  Rex Kras, DO is cardiologist [facility]) Michel Bickers, MD is ID. As of 04/27/20 visit, HIV remained under "excllent, long-term control". Continue Triumeq and follow-up in 1 year.   LABS: Preoperative labs reviewed. Known CKD. Cr 1.47, up from 1.01 on 04/16/20 with Cr range of 1.01-1.93 from 04/13/20-04/16/20 and Cr 1.20-1.62 08/2018-08/2019 in [facility]. AST mildly elevated at 46, ALT normal at 38. A1c 12.9 on 04/13/20 at time of new diagnosis, now on insulin. (all labs ordered are listed, but only abnormal results are displayed)  Labs Reviewed  SURGICAL PCR SCREEN - Abnormal; Notable for the following components:      Result Value   Staphylococcus aureus POSITIVE (*)    All other components within normal limits  GLUCOSE, CAPILLARY - Abnormal; Notable for the following components:   Glucose-Capillary 218 (*)    All other components within normal limits  APTT - Abnormal; Notable for the following components:   aPTT 45 (*)    All other components within normal limits  CBC - Abnormal; Notable for the following components:   RBC 3.71 (*)  Hemoglobin 12.0 (*)    HCT 38.2 (*)    MCV 103.0 (*)    All other components within normal limits  COMPREHENSIVE METABOLIC PANEL - Abnormal; Notable for the following components:   Glucose, Bld 163 (*)    Creatinine, Ser 1.47 (*)    Calcium 10.4 (*)    Total Protein 8.8 (*)    AST 46 (*)    GFR calc non Af Amer 48 (*)    GFR calc Af Amer 55 (*)    All other components within normal limits   PROTIME-INR  URINALYSIS, ROUTINE W REFLEX MICROSCOPIC  TYPE AND SCREEN  ABO/RH     IMAGES: CTA head/neck 04/14/2020: IMPRESSION: 1. 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 2. High-grade, near occlusive, stenosis of the left internal carotid artery 3 cm from the bifurcation. The distal left ICA is the more normal vessel. 3. Moderate proximal left vertebral artery stenosis. 4. Moderate left vertebral artery stenosis at the dural margin just after the left PICA originates. 5. 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 6. Moderate narrowing of the distal right P2 segment. 7. Mild generalized atrophy and white matter disease is stable. 8. 4 mm meningioma posteriorly. 9. No extra-axial hemorrhage. 10. Right temporoparietal scalp soft tissue swelling is improving. 11. Aortic Atherosclerosis (ICD10-I70.0).  CT head/c-spine 04/13/20 (followinog fall, scalp laceration): IMPRESSION: CT HEAD 1. Trace amount of right hyperdense parafalcine thickening which may be calcification though is more indeterminate in the setting of trauma and absence of direct visual comparison. Consider short-term (6 hour) interval follow-up to assess for stability or resolution. 2. No other acute intracranial abnormality. Background parenchymal volume loss and chronic microvascular angiopathy. 3. Extensive soft tissue swelling and thickening over the right temporoparietal region with soft tissue laceration and hematoma of the parietal scalp. No subjacent calvarial fracture. CT CERVICAL SPINE 1. No evidence of acute fracture or traumatic listhesis of the cervical spine. Osseous structures appear diffusely demineralized which may limit detection of small or nondisplaced fractures. 2. Multilevel degenerative disc disease and facet hypertrophic changes of the cervical spine, detailed above. 3. Extensive cervical carotid  atherosclerosis.  1V PCXR 04/13/2020: FINDINGS: Heart is normal size. Mild peribronchial thickening. No confluent opacities or effusions. No acute bony abnormality. IMPRESSION: Mild bronchitic changes.   EKG:  04/30/2020: Sinus bradycardia, 52 bpm, right bundle branch block, without underlying injury pattern.    CV: Cardiac cath 05/05/2020 [facility] Esther Hardy, MD): LM: Mid 40% stenosis LAD: Tortuous vessel, with left-to-right collaterals to distal RCA      Prox 40% stenosis.      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      Mild diffuse disease.       Small caliber OM1 with moderate diffuse disease      Faint collaterals to distal RCA RCA: Prox CTO - Stress test abnormality due to RCA CTO with inadequate collaterals. - Patient is currently asymptomatic from CAD standpoint and is on 1 anti anginal agent. Recommend continued optimization of medical management. Okay to proceed with TCAR with acceptable moderate MACCE risk.   Lexiscan (Walking with mod Bruce)Tetrofosmin Stress Test  04/28/2020: Nondiagnostic ECG stress. Myocardial perfusion is abnormal. Mild degree medium extent perfusion defect consistent with scar with superimposed moderate ischemia located in the apical inferior wall, mid inferior wall and basal inferior wall (Right Coronary Artery region) of left ventricle.  Gated SPECT  imaging of the left ventricle demonstrating hypokinesis of the apical inferior wall, mid inferior wall and basal inferior wall.  Stress LV EF is normal 58%.  No previous exam available for comparison. Intermediate risk study.   Echocardiogram 04/21/2020:  1. Normal LV systolic function with visual EF 55-60%. Left ventricle  cavity is normal in size. Mild left ventricular hypertrophy. Normal global  wall motion. Normal diastolic filling pattern. Calculated EF 55%.  2. Mild (Grade I) mitral regurgitation.  3. No other significant valvular  abnormalities.  4. No prior study for comparison.   Carotid US 04/13/2020: Summary:  - Right Carotid: Velocities in the right ICA are consistent with a 80-99% stenosis.  - Left Carotid: Velocities in the left ICA are consistent with a 1-39% stenosis. Calcific plaque could obscure higher velocities.  - Vertebrals: Bilateral vertebral arteries demonstrate antegrade flow.  - Subclavians: Left subclavian artery was not visualized. Normal flow hemodynamics were seen in the right subclavian artery. Clavicle.    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   Thyroid disease    Tonsillar cancer ([place])    tonsillar ca    Tuberculosis    history of TB about 30 years ago    Past Surgical History:  Procedure Laterality Date   COLONOSCOPY     CORONARY ANGIOGRAPHY N/A 05/05/2020   Procedure: CORONARY ANGIOGRAPHY;  Surgeon: Nigel Mormon, MD;  Location: [facility];  Service: Cardiovascular;  Laterality: N/A;   TONSILECTOMY, ADENOIDECTOMY, BILATERAL MYRINGOTOMY AND TUBES     tonsil cancer    TONSILLECTOMY     UPPER GASTROINTESTINAL ENDOSCOPY      MEDICATIONS:  aspirin EC 81 MG EC tablet   benazepril (LOTENSIN) 10 MG tablet   blood glucose meter kit and supplies   clonazePAM (KLONOPIN) 1 MG tablet   clopidogrel (PLAVIX) 75 MG tablet   diphenhydrAMINE (BENADRYL) 25 MG tablet   feeding supplement, GLUCERNA SHAKE, (GLUCERNA SHAKE) LIQD   fenofibrate (TRICOR) 145 MG tablet   FLUoxetine (PROZAC) 20 MG capsule   folic acid (FOLVITE) 1  MG tablet   HUMALOG KWIKPEN 100 UNIT/ML KwikPen   insulin aspart (NOVOLOG FLEXPEN) 100 UNIT/ML FlexPen   insulin glargine (LANTUS) 100 UNIT/ML Solostar Pen   Insulin Pen Needle 32G X 8 MM MISC   levothyroxine (SYNTHROID, LEVOTHROID) 50 MCG tablet   metoprolol succinate (TOPROL-XL) 25 MG 24 hr tablet   rosuvastatin (CRESTOR) 20 MG tablet   temazepam (RESTORIL) 30 MG capsule   traMADol (ULTRAM) 50 MG tablet   TRIUMEQ 600-50-300 MG tablet   [DISCONTINUED] Dolutegravir Sodium (TIVICAY) 50 MG TABS   No current facility-administered medications for this encounter.    Myra Gianotti, PA-C Surgical Short Stay/Anesthesiology [facility] Phone [phone] [facility] Phone [phone] 05/14/2020 11:26 AM

## 2020-05-18 ENCOUNTER — Inpatient Hospital Stay (HOSPITAL_COMMUNITY): Payer: No Typology Code available for payment source | Admitting: Vascular Surgery

## 2020-05-18 ENCOUNTER — Other Ambulatory Visit: Payer: Self-pay

## 2020-05-18 ENCOUNTER — Encounter (HOSPITAL_COMMUNITY): Payer: Self-pay | Admitting: Vascular Surgery

## 2020-05-18 ENCOUNTER — Inpatient Hospital Stay (HOSPITAL_COMMUNITY): Payer: No Typology Code available for payment source

## 2020-05-18 ENCOUNTER — Inpatient Hospital Stay (HOSPITAL_COMMUNITY)
Admission: RE | Admit: 2020-05-18 | Discharge: 2020-05-19 | DRG: 036 | Disposition: A | Discharge status: Home / Self Care | Payer: No Typology Code available for payment source | Attending: Vascular Surgery | Admitting: Vascular Surgery

## 2020-05-18 ENCOUNTER — Encounter (HOSPITAL_COMMUNITY)
Admission: RE | Disposition: A | Discharge status: Home / Self Care | Payer: Self-pay | Source: Home / Self Care | Attending: Vascular Surgery

## 2020-05-18 ENCOUNTER — Inpatient Hospital Stay (HOSPITAL_COMMUNITY): Payer: No Typology Code available for payment source | Admitting: Anesthesiology

## 2020-05-18 DIAGNOSIS — E1122 Type 2 diabetes mellitus with diabetic chronic kidney disease: Secondary | ICD-10-CM | POA: Diagnosis present

## 2020-05-18 DIAGNOSIS — Z79899 Other long term (current) drug therapy: Secondary | ICD-10-CM

## 2020-05-18 DIAGNOSIS — K219 Gastro-esophageal reflux disease without esophagitis: Secondary | ICD-10-CM | POA: Diagnosis present

## 2020-05-18 DIAGNOSIS — Z8611 Personal history of tuberculosis: Secondary | ICD-10-CM

## 2020-05-18 DIAGNOSIS — Z7902 Long term (current) use of antithrombotics/antiplatelets: Secondary | ICD-10-CM | POA: Diagnosis not present

## 2020-05-18 DIAGNOSIS — I6521 Occlusion and stenosis of right carotid artery: Principal | ICD-10-CM | POA: Diagnosis present

## 2020-05-18 DIAGNOSIS — Z885 Allergy status to narcotic agent status: Secondary | ICD-10-CM

## 2020-05-18 DIAGNOSIS — N189 Chronic kidney disease, unspecified: Secondary | ICD-10-CM | POA: Diagnosis present

## 2020-05-18 DIAGNOSIS — F419 Anxiety disorder, unspecified: Secondary | ICD-10-CM | POA: Diagnosis present

## 2020-05-18 DIAGNOSIS — I6523 Occlusion and stenosis of bilateral carotid arteries: Secondary | ICD-10-CM | POA: Diagnosis present

## 2020-05-18 DIAGNOSIS — A515 Early syphilis, latent: Secondary | ICD-10-CM | POA: Diagnosis present

## 2020-05-18 DIAGNOSIS — Z85818 Personal history of malignant neoplasm of other sites of lip, oral cavity, and pharynx: Secondary | ICD-10-CM

## 2020-05-18 DIAGNOSIS — Z87442 Personal history of urinary calculi: Secondary | ICD-10-CM | POA: Diagnosis not present

## 2020-05-18 DIAGNOSIS — E039 Hypothyroidism, unspecified: Secondary | ICD-10-CM | POA: Diagnosis present

## 2020-05-18 DIAGNOSIS — I2510 Atherosclerotic heart disease of native coronary artery without angina pectoris: Secondary | ICD-10-CM | POA: Diagnosis present

## 2020-05-18 DIAGNOSIS — I129 Hypertensive chronic kidney disease with stage 1 through stage 4 chronic kidney disease, or unspecified chronic kidney disease: Secondary | ICD-10-CM | POA: Diagnosis present

## 2020-05-18 DIAGNOSIS — Z794 Long term (current) use of insulin: Secondary | ICD-10-CM

## 2020-05-18 DIAGNOSIS — E785 Hyperlipidemia, unspecified: Secondary | ICD-10-CM | POA: Diagnosis present

## 2020-05-18 DIAGNOSIS — Z7982 Long term (current) use of aspirin: Secondary | ICD-10-CM

## 2020-05-18 DIAGNOSIS — Z882 Allergy status to sulfonamides status: Secondary | ICD-10-CM

## 2020-05-18 DIAGNOSIS — Z79890 Hormone replacement therapy (postmenopausal): Secondary | ICD-10-CM

## 2020-05-18 HISTORY — PX: TRANSCAROTID ARTERY REVASCULARIZATIONÂ: SHX6778

## 2020-05-18 LAB — GLUCOSE, CAPILLARY
Glucose-Capillary: 144 mg/dL — ABNORMAL HIGH (ref 70–99)
Glucose-Capillary: 134 mg/dL — ABNORMAL HIGH (ref 70–99)
Glucose-Capillary: 219 mg/dL — ABNORMAL HIGH (ref 70–99)
Glucose-Capillary: 134 mg/dL — ABNORMAL HIGH (ref 70–99)

## 2020-05-18 SURGERY — TRANSCAROTID ARTERY REVASCULARIZATION (TCAR)
Anesthesia: General | Site: Neck | Laterality: Right

## 2020-05-18 MED ORDER — CEFAZOLIN SODIUM 1 G IJ SOLR
INTRAMUSCULAR | Status: AC
  Filled 2020-05-18: qty 10

## 2020-05-18 MED ORDER — INSULIN LISPRO (1 UNIT DIAL) 100 UNIT/ML (KWIKPEN): 1.0000 [IU] | PEN_INJECTOR | Freq: Three times a day (TID) | SUBCUTANEOUS | Status: DC

## 2020-05-18 MED ORDER — CEFAZOLIN SODIUM-DEXTROSE 2-4 GM/100ML-% IV SOLN
2.0000 g | Freq: Three times a day (TID) | INTRAVENOUS | Status: AC
  Administered 2020-05-18 – 2020-05-19 (×2): 2 g via INTRAVENOUS
  Filled 2020-05-18 (×2): qty 100

## 2020-05-18 MED ORDER — INSULIN ASPART 100 UNIT/ML ~~LOC~~ SOLN: 0.0000 [IU] | Freq: Three times a day (TID) | SUBCUTANEOUS | Status: DC

## 2020-05-18 MED ORDER — EPHEDRINE SULFATE-NACL 50-0.9 MG/10ML-% IV SOSY
PREFILLED_SYRINGE | INTRAVENOUS | Status: DC | PRN
  Administered 2020-05-18: 15 mg via INTRAVENOUS

## 2020-05-18 MED ORDER — SODIUM CHLORIDE 0.9 % IV SOLN: INTRAVENOUS | Status: DC

## 2020-05-18 MED ORDER — FENTANYL CITRATE (PF) 250 MCG/5ML IJ SOLN
INTRAMUSCULAR | Status: AC
  Filled 2020-05-18: qty 5

## 2020-05-18 MED ORDER — LABETALOL HCL 5 MG/ML IV SOLN: 10.0000 mg | INTRAVENOUS | Status: DC | PRN

## 2020-05-18 MED ORDER — BENAZEPRIL HCL 5 MG PO TABS
10.0000 mg | ORAL_TABLET | Freq: Every day | ORAL | Status: DC
  Administered 2020-05-18 – 2020-05-19 (×2): 10 mg via ORAL
  Filled 2020-05-18 (×2): qty 2

## 2020-05-18 MED ORDER — INSULIN GLARGINE 100 UNIT/ML SOLOSTAR PEN: 26.0000 [IU] | PEN_INJECTOR | Freq: Every day | SUBCUTANEOUS | Status: DC

## 2020-05-18 MED ORDER — LIDOCAINE HCL (PF) 1 % IJ SOLN
INTRAMUSCULAR | Status: AC
  Filled 2020-05-18: qty 5

## 2020-05-18 MED ORDER — CHLORHEXIDINE GLUCONATE 0.12 % MT SOLN: 15.0000 mL | Freq: Once | OROMUCOSAL | Status: AC

## 2020-05-18 MED ORDER — FENOFIBRATE 160 MG PO TABS
160.0000 mg | ORAL_TABLET | Freq: Every day | ORAL | Status: DC
  Administered 2020-05-18 – 2020-05-19 (×2): 160 mg via ORAL
  Filled 2020-05-18 (×2): qty 1

## 2020-05-18 MED ORDER — SODIUM CHLORIDE 0.9 % IV SOLN: 500.0000 mL | Freq: Once | INTRAVENOUS | Status: DC | PRN

## 2020-05-18 MED ORDER — ONDANSETRON HCL 4 MG/2ML IJ SOLN
INTRAMUSCULAR | Status: DC | PRN
  Administered 2020-05-18: 4 mg via INTRAVENOUS

## 2020-05-18 MED ORDER — DEXAMETHASONE SODIUM PHOSPHATE 10 MG/ML IJ SOLN
INTRAMUSCULAR | Status: AC
  Filled 2020-05-18: qty 1

## 2020-05-18 MED ORDER — LEVOTHYROXINE SODIUM 50 MCG PO TABS
50.0000 ug | ORAL_TABLET | Freq: Every day | ORAL | Status: DC
  Administered 2020-05-19: 50 ug via ORAL
  Filled 2020-05-18: qty 1

## 2020-05-18 MED ORDER — PHENYLEPHRINE 40 MCG/ML (10ML) SYRINGE FOR IV PUSH (FOR BLOOD PRESSURE SUPPORT)
PREFILLED_SYRINGE | INTRAVENOUS | Status: DC | PRN
  Administered 2020-05-18 (×2): 120 ug via INTRAVENOUS

## 2020-05-18 MED ORDER — IODIXANOL 320 MG/ML IV SOLN
INTRAVENOUS | Status: DC | PRN
  Administered 2020-05-18: 30 mL

## 2020-05-18 MED ORDER — INSULIN ASPART 100 UNIT/ML ~~LOC~~ SOLN
0.0000 [IU] | Freq: Three times a day (TID) | SUBCUTANEOUS | Status: DC
  Administered 2020-05-18: 2 [IU] via SUBCUTANEOUS

## 2020-05-18 MED ORDER — ABACAVIR-DOLUTEGRAVIR-LAMIVUD 600-50-300 MG PO TABS
1.0000 | ORAL_TABLET | Freq: Every day | ORAL | Status: DC
  Administered 2020-05-18 – 2020-05-19 (×2): 1 via ORAL
  Filled 2020-05-18 (×2): qty 1

## 2020-05-18 MED ORDER — HEPARIN SODIUM (PORCINE) 1000 UNIT/ML IJ SOLN
INTRAMUSCULAR | Status: AC
  Filled 2020-05-18: qty 1

## 2020-05-18 MED ORDER — LACTATED RINGERS IV SOLN: INTRAVENOUS | Status: DC

## 2020-05-18 MED ORDER — POTASSIUM CHLORIDE CRYS ER 20 MEQ PO TBCR: 20.0000 meq | EXTENDED_RELEASE_TABLET | Freq: Every day | ORAL | Status: DC | PRN

## 2020-05-18 MED ORDER — TEMAZEPAM 15 MG PO CAPS
30.0000 mg | ORAL_CAPSULE | Freq: Every day | ORAL | Status: DC
  Administered 2020-05-18: 30 mg via ORAL
  Filled 2020-05-18: qty 2

## 2020-05-18 MED ORDER — GLYCOPYRROLATE PF 0.2 MG/ML IJ SOSY
PREFILLED_SYRINGE | INTRAMUSCULAR | Status: DC | PRN
  Administered 2020-05-18 (×4): .2 mg via INTRAVENOUS

## 2020-05-18 MED ORDER — HEPARIN SODIUM (PORCINE) 1000 UNIT/ML IJ SOLN
INTRAMUSCULAR | Status: DC | PRN
Start: 2020-05-18 — End: 2020-05-18
  Administered 2020-05-18: 3000 [IU] via INTRAVENOUS
  Administered 2020-05-18: 7000 [IU] via INTRAVENOUS

## 2020-05-18 MED ORDER — BISACODYL 10 MG RE SUPP: 10.0000 mg | Freq: Every day | RECTAL | Status: DC | PRN

## 2020-05-18 MED ORDER — ASPIRIN EC 81 MG PO TBEC
81.0000 mg | DELAYED_RELEASE_TABLET | Freq: Every day | ORAL | Status: DC
  Administered 2020-05-18 – 2020-05-19 (×2): 81 mg via ORAL
  Filled 2020-05-18 (×2): qty 1

## 2020-05-18 MED ORDER — DOCUSATE SODIUM 100 MG PO CAPS
100.0000 mg | ORAL_CAPSULE | Freq: Every day | ORAL | Status: DC
  Administered 2020-05-19: 100 mg via ORAL
  Filled 2020-05-18: qty 1

## 2020-05-18 MED ORDER — PROPOFOL 10 MG/ML IV BOLUS
INTRAVENOUS | Status: AC
  Filled 2020-05-18: qty 20

## 2020-05-18 MED ORDER — SUGAMMADEX SODIUM 200 MG/2ML IV SOLN
INTRAVENOUS | Status: DC | PRN
  Administered 2020-05-18: 140 mg via INTRAVENOUS

## 2020-05-18 MED ORDER — GLYCOPYRROLATE PF 0.2 MG/ML IJ SOSY
PREFILLED_SYRINGE | INTRAMUSCULAR | Status: AC
  Filled 2020-05-18: qty 2

## 2020-05-18 MED ORDER — ROCURONIUM BROMIDE 10 MG/ML (PF) SYRINGE
PREFILLED_SYRINGE | INTRAVENOUS | Status: AC
  Filled 2020-05-18: qty 10

## 2020-05-18 MED ORDER — PROTAMINE SULFATE 10 MG/ML IV SOLN
INTRAVENOUS | Status: AC
  Filled 2020-05-18: qty 25

## 2020-05-18 MED ORDER — ONDANSETRON HCL 4 MG/2ML IJ SOLN: 4.0000 mg | Freq: Once | INTRAMUSCULAR | Status: DC | PRN

## 2020-05-18 MED ORDER — PROPOFOL 10 MG/ML IV BOLUS
INTRAVENOUS | Status: DC | PRN
  Administered 2020-05-18: 120 mg via INTRAVENOUS

## 2020-05-18 MED ORDER — HYDRALAZINE HCL 20 MG/ML IJ SOLN: 5.0000 mg | INTRAMUSCULAR | Status: DC | PRN

## 2020-05-18 MED ORDER — 0.9 % SODIUM CHLORIDE (POUR BTL) OPTIME
TOPICAL | Status: DC | PRN
  Administered 2020-05-18: 1000 mL

## 2020-05-18 MED ORDER — LIDOCAINE 2% (20 MG/ML) 5 ML SYRINGE
INTRAMUSCULAR | Status: AC
  Filled 2020-05-18: qty 5

## 2020-05-18 MED ORDER — GUAIFENESIN-DM 100-10 MG/5ML PO SYRP: 15.0000 mL | ORAL_SOLUTION | ORAL | Status: DC | PRN

## 2020-05-18 MED ORDER — INSULIN GLARGINE 100 UNIT/ML ~~LOC~~ SOLN
26.0000 [IU] | Freq: Every day | SUBCUTANEOUS | Status: DC
  Administered 2020-05-18: 26 [IU] via SUBCUTANEOUS
  Filled 2020-05-18 (×2): qty 0.26

## 2020-05-18 MED ORDER — MIDAZOLAM HCL 2 MG/2ML IJ SOLN
INTRAMUSCULAR | Status: AC
  Filled 2020-05-18: qty 2

## 2020-05-18 MED ORDER — ACETAMINOPHEN 325 MG PO TABS
325.0000 mg | ORAL_TABLET | ORAL | Status: DC | PRN
  Administered 2020-05-18: 650 mg via ORAL
  Filled 2020-05-18: qty 2

## 2020-05-18 MED ORDER — FLUOXETINE HCL 20 MG PO CAPS
20.0000 mg | ORAL_CAPSULE | Freq: Every day | ORAL | Status: DC
  Administered 2020-05-18 – 2020-05-19 (×2): 20 mg via ORAL
  Filled 2020-05-18 (×2): qty 1

## 2020-05-18 MED ORDER — LIDOCAINE 2% (20 MG/ML) 5 ML SYRINGE
INTRAMUSCULAR | Status: DC | PRN
  Administered 2020-05-18: 60 mg via INTRAVENOUS

## 2020-05-18 MED ORDER — MORPHINE SULFATE (PF) 2 MG/ML IV SOLN: 2.0000 mg | INTRAVENOUS | Status: DC | PRN

## 2020-05-18 MED ORDER — METOPROLOL TARTRATE 5 MG/5ML IV SOLN: 2.0000 mg | INTRAVENOUS | Status: DC | PRN

## 2020-05-18 MED ORDER — SODIUM CHLORIDE 0.9 % IV SOLN
INTRAVENOUS | Status: AC
  Filled 2020-05-18: qty 1.2

## 2020-05-18 MED ORDER — PHENYLEPHRINE 40 MCG/ML (10ML) SYRINGE FOR IV PUSH (FOR BLOOD PRESSURE SUPPORT)
PREFILLED_SYRINGE | INTRAVENOUS | Status: AC
  Filled 2020-05-18: qty 10

## 2020-05-18 MED ORDER — PROTAMINE SULFATE 10 MG/ML IV SOLN
INTRAVENOUS | Status: DC | PRN
Start: 2020-05-18 — End: 2020-05-18
  Administered 2020-05-18: 100 mg via INTRAVENOUS

## 2020-05-18 MED ORDER — ALUM & MAG HYDROXIDE-SIMETH 200-200-20 MG/5ML PO SUSP: 15.0000 mL | ORAL | Status: DC | PRN

## 2020-05-18 MED ORDER — POLYETHYLENE GLYCOL 3350 17 G PO PACK: 17.0000 g | PACK | Freq: Every day | ORAL | Status: DC | PRN

## 2020-05-18 MED ORDER — ORAL CARE MOUTH RINSE: 15.0000 mL | Freq: Once | OROMUCOSAL | Status: AC

## 2020-05-18 MED ORDER — PHENOL 1.4 % MT LIQD: 1.0000 | OROMUCOSAL | Status: DC | PRN

## 2020-05-18 MED ORDER — ACETAMINOPHEN 500 MG PO TABS
ORAL_TABLET | ORAL | Status: AC
  Administered 2020-05-18: 1000 mg via ORAL
  Filled 2020-05-18: qty 2

## 2020-05-18 MED ORDER — MAGNESIUM SULFATE 2 GM/50ML IV SOLN: 2.0000 g | Freq: Every day | INTRAVENOUS | Status: DC | PRN

## 2020-05-18 MED ORDER — CHLORHEXIDINE GLUCONATE 4 % EX LIQD: 60.0000 mL | Freq: Once | CUTANEOUS | Status: DC

## 2020-05-18 MED ORDER — METOPROLOL SUCCINATE ER 25 MG PO TB24
25.0000 mg | ORAL_TABLET | Freq: Every day | ORAL | Status: DC
  Administered 2020-05-19: 25 mg via ORAL
  Filled 2020-05-18: qty 1

## 2020-05-18 MED ORDER — ONDANSETRON HCL 4 MG/2ML IJ SOLN: 4.0000 mg | Freq: Four times a day (QID) | INTRAMUSCULAR | Status: DC | PRN

## 2020-05-18 MED ORDER — CHLORHEXIDINE GLUCONATE 0.12 % MT SOLN
OROMUCOSAL | Status: AC
  Administered 2020-05-18: 15 mL via OROMUCOSAL
  Filled 2020-05-18: qty 15

## 2020-05-18 MED ORDER — ROSUVASTATIN CALCIUM 20 MG PO TABS
20.0000 mg | ORAL_TABLET | Freq: Every day | ORAL | Status: DC
  Administered 2020-05-19: 20 mg via ORAL
  Filled 2020-05-18: qty 1

## 2020-05-18 MED ORDER — PANTOPRAZOLE SODIUM 40 MG PO TBEC
40.0000 mg | DELAYED_RELEASE_TABLET | Freq: Every day | ORAL | Status: DC
  Administered 2020-05-18 – 2020-05-19 (×2): 40 mg via ORAL
  Filled 2020-05-18 (×2): qty 1

## 2020-05-18 MED ORDER — FENTANYL CITRATE (PF) 100 MCG/2ML IJ SOLN: 25.0000 ug | INTRAMUSCULAR | Status: DC | PRN

## 2020-05-18 MED ORDER — ACETAMINOPHEN 500 MG PO TABS: 1000.0000 mg | ORAL_TABLET | Freq: Once | ORAL | Status: AC

## 2020-05-18 MED ORDER — CEFAZOLIN SODIUM-DEXTROSE 2-4 GM/100ML-% IV SOLN
2.0000 g | INTRAVENOUS | Status: AC
  Administered 2020-05-18: 2 g via INTRAVENOUS

## 2020-05-18 MED ORDER — CEFAZOLIN SODIUM-DEXTROSE 2-4 GM/100ML-% IV SOLN
INTRAVENOUS | Status: AC
  Filled 2020-05-18: qty 100

## 2020-05-18 MED ORDER — ONDANSETRON HCL 4 MG/2ML IJ SOLN
INTRAMUSCULAR | Status: AC
  Filled 2020-05-18: qty 2

## 2020-05-18 MED ORDER — ACETAMINOPHEN 650 MG RE SUPP: 325.0000 mg | RECTAL | Status: DC | PRN

## 2020-05-18 MED ORDER — PHENYLEPHRINE HCL-NACL 10-0.9 MG/250ML-% IV SOLN
INTRAVENOUS | Status: DC | PRN
  Administered 2020-05-18: 25 ug/min via INTRAVENOUS

## 2020-05-18 MED ORDER — CLONAZEPAM 1 MG PO TABS: 1.0000 mg | ORAL_TABLET | Freq: Three times a day (TID) | ORAL | Status: DC | PRN

## 2020-05-18 MED ORDER — MIDAZOLAM HCL 2 MG/2ML IJ SOLN
INTRAMUSCULAR | Status: DC | PRN
  Administered 2020-05-18: 2 mg via INTRAVENOUS

## 2020-05-18 MED ORDER — ROCURONIUM BROMIDE 10 MG/ML (PF) SYRINGE
PREFILLED_SYRINGE | INTRAVENOUS | Status: DC | PRN
  Administered 2020-05-18: 50 mg via INTRAVENOUS
  Administered 2020-05-18: 30 mg via INTRAVENOUS
  Administered 2020-05-18 (×2): 20 mg via INTRAVENOUS

## 2020-05-18 MED ORDER — OXYCODONE-ACETAMINOPHEN 5-325 MG PO TABS: 1.0000 | ORAL_TABLET | ORAL | Status: DC | PRN

## 2020-05-18 MED ORDER — FENTANYL CITRATE (PF) 100 MCG/2ML IJ SOLN
INTRAMUSCULAR | Status: DC | PRN
  Administered 2020-05-18: 100 ug via INTRAVENOUS

## 2020-05-18 MED ORDER — SODIUM CHLORIDE 0.9 % IV SOLN
INTRAVENOUS | Status: DC | PRN
  Administered 2020-05-18: 500 mL

## 2020-05-18 MED ORDER — DEXAMETHASONE SODIUM PHOSPHATE 10 MG/ML IJ SOLN
INTRAMUSCULAR | Status: DC | PRN
  Administered 2020-05-18: 4 mg via INTRAVENOUS

## 2020-05-18 MED ORDER — CLOPIDOGREL BISULFATE 75 MG PO TABS
75.0000 mg | ORAL_TABLET | Freq: Every day | ORAL | Status: DC
  Administered 2020-05-19: 75 mg via ORAL
  Filled 2020-05-18: qty 1

## 2020-05-18 SURGICAL SUPPLY — 65 items
BAG BANDED W/RUBBER/TAPE 36X54 (MISCELLANEOUS) ×3 IMPLANT
BALLN STERLING RX 5X40X80 (BALLOONS) ×3
BALLOON STERLING RX 5X40X80 (BALLOONS) ×1 IMPLANT
CANISTER SUCT 3000ML PPV (MISCELLANEOUS) ×3 IMPLANT
CANNULA VESSEL 3MM 2 BLNT TIP (CANNULA) ×3 IMPLANT
CATH BEACON 5 .035 40 KMP TP (CATHETERS) ×1 IMPLANT
CATH BEACON 5 .038 40 KMP TP (CATHETERS) ×3
CATH ROBINSON RED A/P 18FR (CATHETERS) IMPLANT
CLIP VESOCCLUDE MED 6/CT (CLIP) ×3 IMPLANT
CLIP VESOCCLUDE SM WIDE 6/CT (CLIP) ×3 IMPLANT
COVER DOME SNAP 22 D (MISCELLANEOUS) ×3 IMPLANT
COVER PROBE W GEL 5X96 (DRAPES) ×3 IMPLANT
COVER WAND RF STERILE (DRAPES) ×3 IMPLANT
DECANTER SPIKE VIAL GLASS SM (MISCELLANEOUS) IMPLANT
DERMABOND ADVANCED (GAUZE/BANDAGES/DRESSINGS) ×4
DERMABOND ADVANCED .7 DNX12 (GAUZE/BANDAGES/DRESSINGS) ×2 IMPLANT
DRAIN HEMOVAC 1/8 X 5 (WOUND CARE) IMPLANT
DRAPE INCISE IOBAN 66X45 STRL (DRAPES) ×3 IMPLANT
ELECT REM PT RETURN 9FT ADLT (ELECTROSURGICAL) ×3
ELECTRODE REM PT RTRN 9FT ADLT (ELECTROSURGICAL) ×1 IMPLANT
EVACUATOR SILICONE 100CC (DRAIN) IMPLANT
GLOVE BIO SURGEON STRL SZ7.5 (GLOVE) ×3 IMPLANT
GOWN STRL REUS W/ TWL LRG LVL3 (GOWN DISPOSABLE) ×3 IMPLANT
GOWN STRL REUS W/TWL LRG LVL3 (GOWN DISPOSABLE) ×9
HEMOSTAT SPONGE AVITENE ULTRA (HEMOSTASIS) IMPLANT
INTRODUCER KIT GALT 7CM (INTRODUCER) ×3
KIT BASIN OR (CUSTOM PROCEDURE TRAY) ×3 IMPLANT
KIT ENCORE 26 ADVANTAGE (KITS) ×3 IMPLANT
KIT INTRODUCER GALT 7 (INTRODUCER) ×1 IMPLANT
KIT TURNOVER KIT B (KITS) ×3 IMPLANT
NEEDLE HYPO 25GX1X1/2 BEV (NEEDLE) IMPLANT
NEEDLE PERC 18GX7CM (NEEDLE) ×3 IMPLANT
NS IRRIG 1000ML POUR BTL (IV SOLUTION) ×6 IMPLANT
PACK CAROTID (CUSTOM PROCEDURE TRAY) ×3 IMPLANT
PACK UNIVERSAL I (CUSTOM PROCEDURE TRAY) ×3 IMPLANT
PAD ARMBOARD 7.5X6 YLW CONV (MISCELLANEOUS) ×6 IMPLANT
POSITIONER HEAD DONUT 9IN (MISCELLANEOUS) ×3 IMPLANT
PROTECTION STATION PRESSURIZED (MISCELLANEOUS)
SET MICROPUNCTURE 5F STIFF (MISCELLANEOUS) IMPLANT
SHEATH AVANTI 11CM 5FR (SHEATH) IMPLANT
SHUNT CAROTID BYPASS 10 (VASCULAR PRODUCTS) IMPLANT
SHUNT CAROTID BYPASS 12FRX15.5 (VASCULAR PRODUCTS) IMPLANT
STATION PROTECTION PRESSURIZED (MISCELLANEOUS) IMPLANT
STENT TRANSCAROTID SYS 10X40 (Permanent Stent) ×3 IMPLANT
STOPCOCK MORSE 400PSI 3WAY (MISCELLANEOUS) ×3 IMPLANT
SUT ETHILON 3 0 PS 1 (SUTURE) IMPLANT
SUT PROLENE 6 0 CC (SUTURE) ×6 IMPLANT
SUT SILK 2 0 PERMA HAND 18 BK (SUTURE) ×3 IMPLANT
SUT SILK 2 0SH CR/8 30 (SUTURE) ×3 IMPLANT
SUT SILK 3 0 TIES 17X18 (SUTURE)
SUT SILK 3-0 18XBRD TIE BLK (SUTURE) IMPLANT
SUT VIC AB 3-0 SH 27 (SUTURE) ×6
SUT VIC AB 3-0 SH 27X BRD (SUTURE) ×2 IMPLANT
SUT VICRYL 4-0 PS2 18IN ABS (SUTURE) ×3 IMPLANT
SYR 10ML LL (SYRINGE) ×9 IMPLANT
SYR 20ML LL LF (SYRINGE) ×3 IMPLANT
SYR 5ML LL (SYRINGE) ×3 IMPLANT
SYR CONTROL 10ML LL (SYRINGE) IMPLANT
TOWEL GREEN STERILE (TOWEL DISPOSABLE) ×3 IMPLANT
TUBING EXTENTION W/L.L. (IV SETS) ×3 IMPLANT
WATER STERILE IRR 1000ML POUR (IV SOLUTION) ×3 IMPLANT
WIRE AMPLATZ SS-J .035X180CM (WIRE) IMPLANT
WIRE BENTSON .035X145CM (WIRE) IMPLANT
WIRE SPARTACORE .014X300CM (WIRE) ×3 IMPLANT
WIRE STARTER BENTSON 035X150 (WIRE) ×3 IMPLANT

## 2020-05-18 NOTE — Discharge Instructions (Signed)
   [facility]  Discharge Instructions   Carotid Endarterectomy (CEA)  Please refer to the following instructions for your post-procedure care. Your surgeon or physician assistant will discuss any changes with you.  Activity  You are encouraged to walk as much as you can. You can slowly return to normal activities but must avoid strenuous activity and heavy lifting until your doctor tell you it's OK. Avoid activities such as vacuuming or swinging a golf club. You can drive after one week if you are comfortable and you are no longer taking prescription pain medications. It is normal to feel tired for serval weeks after your surgery. It is also normal to have difficulty with sleep habits, eating, and bowel movements after surgery. These will go away with time.  Bathing/Showering  You may shower after you come home. Do not soak in a bathtub, hot tub, or swim until the incision heals completely.  Incision Care  Shower every day. Clean your incision with mild soap and water. Pat the area dry with a clean towel. You do not need a bandage unless otherwise instructed. Do not apply any ointments or creams to your incision. You may have skin glue on your incision. Do not peel it off. It will come off on its own in about one week. Your incision may feel thickened and raised for several weeks after your surgery. This is normal and the skin will soften over time. For Men Only: It's OK to shave around the incision but do not shave the incision itself for 2 weeks. It is common to have numbness under your chin that could last for several months.  Diet  Resume your normal diet. There are no special food restrictions following this procedure. A low fat/low cholesterol diet is recommended for all patients with vascular disease. In order to heal from your surgery, it is CRITICAL to get adequate nutrition. Your body requires vitamins, minerals, and protein. Vegetables are the best  source of vitamins and minerals. Vegetables also provide the perfect balance of protein. Processed food has little nutritional value, so try to avoid this.        Medications  Resume taking all of your medications unless your doctor or physician assistant tells you not to. If your incision is causing pain, you may take over-the- counter pain relievers such as acetaminophen (Tylenol). If you were prescribed a stronger pain medication, please be aware these medications can cause nausea and constipation. Prevent nausea by taking the medication with a snack or meal. Avoid constipation by drinking plenty of fluids and eating foods with a high amount of fiber, such as fruits, vegetables, and grains. Do not take Tylenol if you are taking prescription pain medications.  Follow Up  Our office will schedule a follow up appointment 2-3 weeks following discharge.  Please call us immediately for any of the following conditions  Increased pain, redness, drainage (pus) from your incision site. Fever of 101 degrees or higher. If you should develop stroke (slurred speech, difficulty swallowing, weakness on one side of your body, loss of vision) you should call 911 and go to the nearest emergency room.  Reduce your risk of vascular disease:  Stop smoking. If you would like help call [facility] at 1-800-QUIT-NOW (1-800-784-8669) or [place] at 336-586-4000. Manage your cholesterol Maintain a desired weight Control your diabetes Keep your blood pressure down  If you have any questions, please call the office at [phone].   

## 2020-05-18 NOTE — Op Note (Signed)
Procedure: Right Trans carotid artery revascularization (TCAR)  Preoperative diagnosis: Greater than 80% right internal carotid artery stenosis  Postoperative diagnosis: Same  Anesthesia: Gen.  Assistant: Fortunato Curling M.D.  Indications: Patient is a 69 year old male with greater than 80% tandem lesions at the carotid bifurcation and distal internal carotid artery with previous irradiation of the neck  Operative findings: #1 90% right internal carotid artery stenosis stented to residual 0% stenosis (10 x 40 mm ENROUTE stent)  #2 right femoral venous access  #3  30 minutes flow reversal time  Operative details: After obtaining informed consent, the patient was taken the operating room. The patient was placed in supine position on the operating room table. After induction of general anesthesia and endotracheal intubation, the entire right neck and chest were prepped and draped in usual sterile fashion. The right groin was also prepped and draped in usual sterile fashion. A time out was performed. Next a transverse incision was made between the heads of the sternocleidomastoid muscle on the right side of the neck. Incision was carried down through the platysma to the level of the right internal jugular vein. This was reflected laterally.  The patient had a fairly distal ulnar arteries and subclavian arteries is identified within the incision as well and was exposed.  The vagus nerve was identified and protected. Common carotid artery was dissected free circumferentially at the base of the incision. This was elevated up in the operative field with a vessel loop. Approximately 3-4 cm of the artery was dissected free circumferentially to allow adequate exposure. A pursestring suture was placed in the artery at the area we were planning to puncture using a running 6-0 Prolene suture. At this point femoral venous access was established via the right groin. Ultrasound was used to identify the right common  femoral vein. An introducer needle was used to cannulate the right common femoral vein and advanced into the vein.  An 4 Bentsen wire was advanced up into the right femoral venous system. The sheath for the flow reversal system was placed into the  femoral system and thoroughly flushed with heparinized saline. The patient was given 10,000 units of total heparin to establish an ACT greater than 250.  The patient was given arterial was raised and maintained greater than 100 mmHg.  At this point a micropuncture needle was used to cannulate the right common carotid artery. The micropuncture wire was advanced just below the level of the carotid bifurcation. The micropuncture sheath was then advanced over this about 2 cm into the common carotid artery. This was thoroughly flushed with heparinized saline.a contrast angiogram was then performed in AP projection to confirm that we were truly intraluminal with the sheath. This was also used to determine the level of the carotid bifurcation. The carotid Amplatz wire was then placed through the micropuncture sheath and the sheath removed. The 8 French flow reversal arterial sheath was then advanced over the guidewire and into the common carotid artery. Sheath was sutured to the skin with 3 2-0 silk sutures.Contrast angiogram was again performed to make sure that we were within the true lumen. This was done in 2 views.  There was no evidence of dissection of the existing disease  The filter device was switched to the low-flow selection. The flow reversal system was then hooked up to the arterial end of the sheath after everything had been thoroughly flushed and de-aired. Passive flow was used to fill the arterial and of the filter and through the filter  and up to the level of the venous sheath. The venous sheath was also fully de-aired and passive flow was established. This was checked by saline infusion in the venous port and noted to flush easily. High flow was then turned  on on the filter device. At this point a TCAR timeout was performed to make sure that the patient's blood pressure was reasonable.  We again confirmed that the ACT was above 250. The balloon and guidewire insufflator and stent were all selected. These were all prepped. A 5 x 40 mm angioplasty balloon had been selected based on the preoperative CT. Based on the angiogram intraoperatively as well as preoperative CT and 10 x 40 mm Enroute stent was selected. The 014 wire was slightly shaped opening and due to slight curvature of the internal carotid artery. This was advanced with the balloon over it into the common carotid artery and a guidewire used to selectively catheterize the left internal carotid artery.  Lesion was fairly complex with calcification and a very tight lumen greater than 90%.  I initially tried using some balloon for support for the catheter but the wire would not pass.  This was then swapped out for an [place] catheter.  Again the catheter and wire would not.,  This was swapped out for a 014 [place] core balloon apparatus and the wire finally passed up into the intracranial portion of the graft.  We confirmed that we were within the internal carotid artery by first establishing that the guidewire advanced into the petrous portion of the internal carotid artery  At this point the 5 x 40 balloon was centered on the lesion and inflated to 8 atm slowly inflating and deflating the balloon. The patient had been given a dose of glycopyrrolate to maintain blood pressure and heart rate. We then brought up the 10 x 40 Enroute stent and advanced and centered this on the lesion.  This was then deployed using a pinch technique after opening the Tuohy Borst valve.  We then gave the patient 2 minutes of additional flow reversal before performing a completion angiogram.  Completion angiogram showed no residual waist.  At this point the guidewire was removed. The flow reversal system was clamped off at the  arterial sheath and disconnected. The remaining blood was returned to the patient. This was then disconnected from the venous sheath. The arterial sheath was removed and the pursestring suture cinched down. The patient was given 100 mg of protamine to achieve hemostasis. The venous sheath was pulled and hemostasis obtained with direct pressure. The carotid was inspected found to be without any area of hematoma or active bleeding. The platysma muscle was reapproximated using a running 3-0 Vicryl suture. The skin was closed with a 4-0 Vicryl subcuticular stitch. Dermabond was applied to the incision. Patient was awakened in the operating room and moving her upper and lower extremities symmetrically. She was taken to the recovery room in stable condition.  Dr. Ainsley Spinner role in the procedure was assisting with wire manipulation of the lesion of a complex right internal carotid artery stenosis.  Ruta Hinds, MD [facility] Office: [phone] Pager: [phone]

## 2020-05-18 NOTE — Transfer of Care (Signed)
Immediate Anesthesia Transfer of Care Note  Patient: Christopher [name]  Procedure(s) Performed: RIGHT TRANSCAROTID ARTERY REVASCULARIZATION (Right Neck)  Patient Location: PACU  Anesthesia Type:General  Level of Consciousness: awake, alert  and oriented  Airway & Oxygen Therapy: Patient Spontanous Breathing  Post-op Assessment: Report given to RN, Patient moving all extremities X 4 and Patient able to stick tongue midline  Post vital signs: Reviewed and stable  Last Vitals:  Vitals Value Taken Time  BP 125/84 05/18/20 1016  Temp    Pulse 70 05/18/20 1017  Resp 13 05/18/20 1017  SpO2 100 % 05/18/20 1017  Vitals shown include unvalidated device data.  Last Pain:  Vitals:   05/18/20 0619  TempSrc: Oral  PainSc: 0-No pain      Patients Stated Pain Goal: 4 (02/33/43 5686)  Complications: No complications documented.

## 2020-05-18 NOTE — Interval H&P Note (Signed)
History and Physical Interval Note:  05/18/2020 7:27 AM  Christopher [name]  has presented today for surgery, with the diagnosis of CAROTID ARTERY STENOSIS.  The various methods of treatment have been discussed with the patient and family. After consideration of risks, benefits and other options for treatment, the patient has consented to  Procedure(s): TRANSCAROTID ARTERY REVASCULARIZATION (Right) as a surgical intervention.  The patient's history has been reviewed, patient examined, no change in status, stable for surgery.  I have reviewed the patient's chart and labs.  Questions were answered to the patient's satisfaction.     Ruta Hinds

## 2020-05-18 NOTE — Progress Notes (Addendum)
  Day of Surgery Note    Subjective: Feels well. Husband at bedside.  He required in and out catheterization in the PACU 1400 cc    Vitals:   05/18/20 1200 05/18/20 1300  BP: 123/66 (!) 143/75  Pulse: 64   Resp: 11 13  Temp:    SpO2: 97% 100%    Incisions:   Right neck incision well approximated Extremities: Moves all extremities well.  Right groin venipuncture without bleeding or hematoma Cardiac: Rate and rhythm are regular Lungs: Nonlabored Neuro: Alert and oriented x4.  Speech at baseline.   Assessment/Plan:  This is a 69 y.o. male who is s/p right TCAR.  The patient has a history of tonsillar cancer s/p head/neck surgery and radiation therapy.  Chronic facial asymmetry.  He is neurologically intact.  Continue current treatment plan and observation/monitoring.  1724 hours: Patient still not voiding spontaneously.  I have instructed his RN that if he is unable to void after ambulating to place Foley catheter leave to gravity drainage and discontinue in the morning.  Risa Grill, PA-C 05/18/2020 2:32 PM [phone]

## 2020-05-18 NOTE — Progress Notes (Signed)
Pt ambulated 470' on room air with front wheel walker.  Did very well.  Had been unable to void prior to ambulation and had to be in and out cathed in PACU, but was able to upon returning to room.

## 2020-05-18 NOTE — Anesthesia Procedure Notes (Signed)
Arterial Line Insertion Start/End6/28/2021 7:10 AM, 05/18/2020 7:20 AM Performed by: Barrington Ellison, CRNA, CRNA  Patient location: Pre-op. Preanesthetic checklist: patient identified, risks and benefits discussed and pre-op evaluation Lidocaine 1% used for infiltration Right, radial was placed Catheter size: 20 G Hand hygiene performed  and maximum sterile barriers used  Allen's test indicative of satisfactory collateral circulation Attempts: 2 Procedure performed without using ultrasound guided technique. Following insertion, dressing applied and Biopatch. Post procedure assessment: normal  Patient tolerated the procedure well with no immediate complications.

## 2020-05-18 NOTE — Anesthesia Procedure Notes (Signed)
Procedure Name: Intubation Date/Time: 05/18/2020 7:46 AM Performed by: Barrington Ellison, CRNA Pre-anesthesia Checklist: Patient identified, Emergency Drugs available, Suction available and Patient being monitored Patient Re-evaluated:Patient Re-evaluated prior to induction Oxygen Delivery Method: Circle System Utilized Preoxygenation: Pre-oxygenation with 100% oxygen Induction Type: IV induction Ventilation: Mask ventilation without difficulty Laryngoscope Size: Glidescope and 4 Grade View: Grade I Tube type: Oral Tube size: 7.5 mm Number of attempts: 2 Airway Equipment and Method: Stylet and Oral airway Placement Confirmation: ETT inserted through vocal cords under direct vision,  positive ETCO2 and breath sounds checked- equal and bilateral Secured at: 22 cm Tube secured with: Tape Dental Injury: Teeth and Oropharynx as per pre-operative assessment  Difficulty Due To: Difficulty was unanticipated, Difficult Airway- due to immobile epiglottis and Difficult Airway- due to anterior larynx Future Recommendations: Recommend- induction with short-acting agent, and alternative techniques readily available Comments: Attempt 1, Mac 4, could visualize epiglottis, but not manipulate adequately for intubation. Attempt 2 with Glidescope, easier to visualize but "hubbed" the glidescope blade for adequate view.

## 2020-05-19 ENCOUNTER — Encounter (HOSPITAL_COMMUNITY): Payer: Self-pay | Admitting: Vascular Surgery

## 2020-05-19 LAB — BASIC METABOLIC PANEL
Anion gap: 11 (ref 5–15)
BUN: 22 mg/dL (ref 8–23)
CO2: 22 mmol/L (ref 22–32)
Calcium: 9.4 mg/dL (ref 8.9–10.3)
Chloride: 102 mmol/L (ref 98–111)
Creatinine, Ser: 1.28 mg/dL — ABNORMAL HIGH (ref 0.61–1.24)
GFR calc Af Amer: >60 mL/min (ref >60)
GFR calc non Af Amer: 56 mL/min — ABNORMAL LOW (ref >60)
Glucose, Bld: 122 mg/dL — ABNORMAL HIGH (ref 70–99)
Potassium: 3.6 mmol/L (ref 3.5–5.1)
Sodium: 135 mmol/L (ref 135–145)

## 2020-05-19 LAB — GLUCOSE, CAPILLARY: Glucose-Capillary: 138 mg/dL — ABNORMAL HIGH (ref 70–99)

## 2020-05-19 LAB — CBC
HCT: 31.7 % — ABNORMAL LOW (ref 39.0–52.0)
Hemoglobin: 10.4 g/dL — ABNORMAL LOW (ref 13.0–17.0)
MCH: 33 pg (ref 26.0–34.0)
MCHC: 32.8 g/dL (ref 30.0–36.0)
MCV: 100.6 fL — ABNORMAL HIGH (ref 80.0–100.0)
Platelets: 169 10*3/uL (ref 150–400)
RBC: 3.15 MIL/uL — ABNORMAL LOW (ref 4.22–5.81)
RDW: 13.1 % (ref 11.5–15.5)
WBC: 4.1 10*3/uL (ref 4.0–10.5)
nRBC: 0 % (ref 0.0–0.2)

## 2020-05-19 LAB — POCT ACTIVATED CLOTTING TIME
Activated Clotting Time: 246 seconds
Activated Clotting Time: 268 seconds

## 2020-05-19 MED ORDER — HYDROCODONE-ACETAMINOPHEN 5-325 MG PO TABS: 1.0000 | ORAL_TABLET | ORAL | 0 refills | Status: DC | PRN

## 2020-05-19 NOTE — Progress Notes (Addendum)
Progress Note    05/19/2020 7:40 AM 1 Day Post-Op  Subjective: Doing well this morning.  He is out of bed to chair.  He is already ambulated and is voiding spontaneously.  No significant pain   Vitals:   05/19/20 0000 05/19/20 0305  BP: 124/73 105/60  Pulse:  66  Resp: 14 14  Temp: 98 F (36.7 C) 98.1 F (36.7 C)  SpO2: 97% 98%    Physical Exam: Cardiac: Heart rate and rhythm are regular Lungs: Clear to auscultation bilaterally Incisions: Right neck incision well approximated without hematoma or signs of infection. Extremities: 5 out of 5 grip strength, Neuro: Alert and oriented x4.  Speech clear.  CBC    Component Value Date/Time   WBC 4.1 05/19/2020 0323   RBC 3.15 (L) 05/19/2020 0323   HGB 10.4 (L) 05/19/2020 0323   HGB 14.4 07/02/2012 0856   HCT 31.7 (L) 05/19/2020 0323   HCT 42.8 07/02/2012 0856   PLT 169 05/19/2020 0323   PLT 179 07/02/2012 0856   MCV 100.6 (H) 05/19/2020 0323   MCV 93.5 07/02/2012 0856   MCH 33.0 05/19/2020 0323   MCHC 32.8 05/19/2020 0323   RDW 13.1 05/19/2020 0323   RDW 15.0 (H) 07/02/2012 0856   LYMPHSABS 0.5 (L) 04/13/2020 0100   LYMPHSABS 1.2 07/02/2012 0856   MONOABS 0.6 04/13/2020 0100   MONOABS 0.3 07/02/2012 0856   EOSABS 0.0 04/13/2020 0100   EOSABS 0.1 07/02/2012 0856   BASOSABS 0.0 04/13/2020 0100   BASOSABS 0.0 07/02/2012 0856    BMET    Component Value Date/Time   NA 135 05/19/2020 0323   NA 140 10/31/2011 1039   K 3.6 05/19/2020 0323   K 4.4 10/31/2011 1039   CL 102 05/19/2020 0323   CL 100 10/31/2011 1039   CO2 22 05/19/2020 0323   CO2 30 10/31/2011 1039   GLUCOSE 122 (H) 05/19/2020 0323   GLUCOSE 97 10/31/2011 1039   BUN 22 05/19/2020 0323   BUN 18 10/31/2011 1039   CREATININE 1.28 (H) 05/19/2020 0323   CREATININE 1.20 09/20/2019 1116   CALCIUM 9.4 05/19/2020 0323   CALCIUM 9.4 10/31/2011 1039   GFRNONAA 56 (L) 05/19/2020 0323   GFRNONAA 43 (L) 09/04/2018 1154   GFRAA >60 05/19/2020 0323   GFRAA 50  (L) 09/04/2018 1154     Intake/Output Summary (Last 24 hours) at 05/19/2020 0740 Last data filed at 05/19/2020 0600 Gross per 24 hour  Intake 2776.53 ml  Output 2355 ml  Net 421.53 ml    HOSPITAL MEDICATIONS Scheduled Meds: . abacavir-dolutegravir-lamiVUDine  1 tablet Oral Daily  . aspirin EC  81 mg Oral Daily  . benazepril  10 mg Oral Daily  . clopidogrel  75 mg Oral Q0600  . docusate sodium  100 mg Oral Daily  . fenofibrate  160 mg Oral Daily  . FLUoxetine  20 mg Oral Daily  . insulin aspart  0-6 Units Subcutaneous TID WC  . insulin glargine  26 Units Subcutaneous QHS  . levothyroxine  50 mcg Oral Q0600  . metoprolol succinate  25 mg Oral Daily  . pantoprazole  40 mg Oral Daily  . rosuvastatin  20 mg Oral Daily  . temazepam  30 mg Oral QHS   Continuous Infusions: . sodium chloride    . sodium chloride Stopped (05/19/20 0327)  . magnesium sulfate bolus IVPB     PRN Meds:.sodium chloride, acetaminophen **OR** acetaminophen, alum & mag hydroxide-simeth, bisacodyl, clonazePAM, guaiFENesin-dextromethorphan, hydrALAZINE, labetalol, magnesium sulfate  bolus IVPB, metoprolol tartrate, morphine injection, ondansetron, oxyCODONE-acetaminophen, phenol, polyethylene glycol, potassium chloride  Assessment: Status post right TCAR.  Hemodynamically stable.  Neurologically intact.     Plan: -discharge home  Risa Grill, PA-C [facility] [phone] 05/19/2020  7:40 AM   Agree with above.  Follow up 2-3 weeks with carotid duplex.  Ruta Hinds, MD [facility] [place] Office: [phone]

## 2020-05-19 NOTE — Progress Notes (Signed)
Pt discharging home with husband. IV and telemetry box removed. Pt and husband received discharge instructions and all questions were answered.

## 2020-05-19 NOTE — Discharge Summary (Signed)
Discharge Summary     VINT POLA 1951-02-22 69 y.o. male  [id number]  Admission Date: 05/18/2020  Discharge Date: 05/19/2020 Physician: Dr. Ruta Hinds Admission Diagnosis: Carotid stenosis, asymptomatic, right [I65.21]   HPI:   This is a 69 y.o. male patient is a 69 year old male with greater than 80% tandem lesions at the carotid bifurcation and distal internal carotid artery with previous irradiation of the neck  Hospital Course:  The patient was admitted to the hospital and taken to the operating room on 05/18/2020 and underwent right Trans carotid artery revascularization (TCAR).  He tolerated procedure well and was taken to the PACU in good condition.  Neurologically intact.  He was then transferred to the progressive care unit where his vital signs remained stable and he remained afebrile.  He required in and out catheterization postoperatively but was able to void spontaneously.  He was ambulating without difficulty.  Upper and lower extremity strength normal.  Speech and face symmetry at baseline.  Findings: #1 90% right internal carotid artery stenosis stented to residual 0% stenosis (10 x 40 mm ENROUTE stent)  #2 right femoral venous access  #3  30 minutes flow reversal time  The pt tolerated the procedure well and was transported to the PACU in excellent condition.   By POD 1, the pt neuro status intact. Hemoglobin stable. Hemodynamically stable.  The remainder of the hospital course consisted of increasing mobilization and increasing intake of solids without difficulty.   Recent Labs    05/19/20 0323  NA 135  K 3.6  CL 102  CO2 22  GLUCOSE 122*  BUN 22  CALCIUM 9.4   Recent Labs    05/19/20 0323  WBC 4.1  HGB 10.4*  HCT 31.7*  PLT 169   No results for input(s): INR in the last 72 hours.   Discharge Instructions    Discharge patient   Complete by: As directed    Discharge disposition: 01-Home or Self Care   Discharge patient date:  05/19/2020      Discharge Diagnosis:  Carotid stenosis, asymptomatic, right [I65.21]  Secondary Diagnosis: Patient Active Problem List   Diagnosis Date Noted  . Carotid stenosis, asymptomatic, right 05/18/2020  . Abnormal stress test 05/04/2020  . Malnutrition of moderate degree 04/16/2020  . Hyperosmolar hyperglycemic state (HHS) ([place]) 04/13/2020  . Diabetes ([place]) 04/13/2020  . Head injury 04/13/2020  . Diarrhea 04/13/2020  . AKI (acute kidney injury) ([place]) 04/13/2020  . Depression 10/01/2013  . Peripheral neuropathy 09/30/2013  . Renal insufficiency 08/19/2013  . Hypothyroidism 11/01/2012  . 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 Hyperglycemia 12/08/2011  . HYPOGONADISM 12/23/2009  . 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 Dyslipidemia 01/03/2008  . SYPHILIS, [place], LATENT NOS 01/18/2007  . Human immunodeficiency virus (HIV) disease ([place]) 12/01/2006  . Essential hypertension 12/01/2006  . PROTEINURIA 12/01/2006   Past Medical History:  Diagnosis Date  . Anxiety   . Cataract   . Chronic kidney disease   . 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   . Diabetes mellitus without complication ([place])   . GERD (gastroesophageal reflux disease)   . Heart murmur    CHILDHOOD  . History of kidney stones   . HIV DISEASE 12/01/2006  . HYPERLIPIDEMIA, WITH LOW HDL 01/03/2008  . Hypertension   . HYPERTENSION 12/01/2006  .  HYPOGONADISM 12/23/2009  . Hypothyroidism   . Neuromuscular disorder ([place])   . SYPHILIS, [place], LATENT NOS 01/18/2007  . Thyroid disease   . 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 Tuberculosis    history of TB about 30 years ago    Allergies as of 05/19/2020      Reactions   Codeine Other (See Comments)   "makes me crazy"   Sulfamethoxazole-trimethoprim Itching, Other (See  Comments)   "makes me crazy"   Sulfonamide Derivatives Itching, Other (See Comments)   "makes me crazy"      Medication List    TAKE these medications   aspirin 81 MG EC tablet Take 1 tablet (81 mg total) by mouth daily.   benazepril 10 MG tablet Commonly known as: LOTENSIN Take 10 mg by mouth daily.   blood glucose meter kit and supplies Dispense based on patient and insurance preference. Use up to four times daily as directed. (FOR ICD-10 E10.9, E11.9).   clonazePAM 1 MG tablet Commonly known as: KLONOPIN TAKE 1 TABLET 3 TIMES A DAY AS NEEDED FOR ANXIETY What changed: See the new instructions.   clopidogrel 75 MG tablet Commonly known as: PLAVIX Take 1 tablet (75 mg total) by mouth daily.   diphenhydrAMINE 25 MG tablet Commonly known as: BENADRYL Take 50 mg by mouth daily. Notes to patient: You did not receive this medication during your hospital stay. You may resume it after discharge.   fenofibrate 145 MG tablet Commonly known as: TRICOR TAKE ONE TABLET BY MOUTH DAILY   FLUoxetine 20 MG capsule Commonly known as: PROZAC TAKE ONE CAPSULE BY MOUTH DAILY   folic acid 1 MG tablet Commonly known as: FOLVITE Take 2 tablets (2 mg total) by mouth daily. What changed: how much to take Notes to patient: You did not receive this medication during your hospital stay. You may resume it after discharge.   HumaLOG KwikPen 100 UNIT/ML KwikPen Generic drug: insulin lispro Inject 1-5 Units into the skin 3 (three) times daily before meals.   HYDROcodone-acetaminophen 5-325 MG tablet Commonly known as: NORCO/VICODIN Take 1 tablet by mouth every 4 (four) hours as needed for moderate pain. Notes to patient: **NEW** For severe pain. May cause drowsiness, dizziness and constipation. May use over-the-counter docusate, miralax or senna for constipation   insulin glargine 100 UNIT/ML Solostar Pen Commonly known as: LANTUS Inject 26 Units into the skin daily at 10 pm.   Insulin  Pen Needle 32G X 8 MM Misc Use as directed   levothyroxine 50 MCG tablet Commonly known as: SYNTHROID Take 1 tablet (50 mcg total) by mouth daily before breakfast.   metoprolol succinate 25 MG 24 hr tablet Commonly known as: TOPROL-XL Take 1 tablet (25 mg total) by mouth daily.   rosuvastatin 20 MG tablet Commonly known as: CRESTOR Take 1 tablet (20 mg total) by mouth daily.   temazepam 30 MG capsule Commonly known as: RESTORIL Take 30 mg by mouth at bedtime.   traMADol 50 MG tablet Commonly known as: ULTRAM Take 50 mg by mouth every 6 (six) hours as needed for moderate pain. Notes to patient: For moderate pain. May cause drowsiness, dizziness and constipation. May use over-the-counter docusate, miralax or senna for constipation   Triumeq 600-50-300 MG tablet Generic drug: abacavir-dolutegravir-lamiVUDine TAKE ONE TABLET BY MOUTH DAILY        [facility] Discharge Instructions Carotid Endarterectomy (CEA)  Please refer to the following instructions for your post-procedure  care. Your surgeon or physician assistant will discuss any changes with you.  Activity  You are encouraged to walk as much as you can. You can slowly return to normal activities but must avoid strenuous activity and heavy lifting until your doctor tell you it's OK. Avoid activities such as vacuuming or swinging a golf club. You can drive after one week if you are comfortable and you are no longer taking prescription pain medications. It is normal to feel tired for serval weeks after your surgery. It is also normal to have difficulty with sleep habits, eating, and bowel movements after surgery. These will go away with time.  Bathing/Showering  You may shower after you come home. Do not soak in a bathtub, hot tub, or swim until the incision heals completely.  Incision Care  Shower every day. Clean your incision with mild soap and water. Pat the area dry with a clean towel.  You do not need a bandage unless otherwise instructed. Do not apply any ointments or creams to your incision. You may have skin glue on your incision. Do not peel it off. It will come off on its own in about one week. Your incision may feel thickened and raised for several weeks after your surgery. This is normal and the skin will soften over time. For Men Only: It's OK to shave around the incision but do not shave the incision itself for 2 weeks. It is common to have numbness under your chin that could last for several months.  Diet  Resume your normal diet. There are no special food restrictions following this procedure. A low fat/low cholesterol diet is recommended for all patients with vascular disease. In order to heal from your surgery, it is CRITICAL to get adequate nutrition. Your body requires vitamins, minerals, and protein. Vegetables are the best source of vitamins and minerals. Vegetables also provide the perfect balance of protein. Processed food has little nutritional value, so try to avoid this.  Medications  Resume taking all of your medications unless your doctor or physician assistant tells you not to.  If your incision is causing pain, you may take over-the- counter pain relievers such as acetaminophen (Tylenol). If you were prescribed a stronger pain medication, please be aware these medications can cause nausea and constipation.  Prevent nausea by taking the medication with a snack or meal. Avoid constipation by drinking plenty of fluids and eating foods with a high amount of fiber, such as fruits, vegetables, and grains.  Do not take Tylenol if you are taking prescription pain medications.  Follow Up  Our office will schedule a follow up appointment 2-3 weeks following discharge.  Please call us immediately for any of the following conditions  . Increased pain, redness, drainage (pus) from your incision site. . Fever of 101 degrees or higher. . If you should develop stroke  (slurred speech, difficulty swallowing, weakness on one side of your body, loss of vision) you should call 911 and go to the nearest emergency room. .  Reduce your risk of vascular disease:  . Stop smoking. If you would like help call [facility] at 1-800-QUIT-NOW 415-172-1623) or [place] at (907)289-8357. . Manage your cholesterol . Maintain a desired weight . Control your diabetes . Keep your blood pressure down .  If you have any questions, please call the office at [phone].  Prescriptions given: 1.   Roxicet #20 No Refill   Disposition: Home  Patient's condition: is Excellent  Follow up: 1. Dr. Oneida Alar  in 2 weeks.   Risa Grill, PA-C [facility] [phone]   --- For [facility] use ---   Modified Rankin score at D/C (0-6): 0  IV medication needed for:  1. Hypertension: No 2. Hypotension: No  Post-op Complications: No  1. Post-op CVA or TIA: No  If yes: Event classification (right eye, left eye, right cortical, left cortical, verterobasilar, other):   If yes: Timing of event (intra-op, <6 hrs post-op, >=6 hrs post-op, unknown):   2. CN injury: No  If yes: CN n/a injuried   3. Myocardial infarction: No  If yes: Dx by (EKG or clinical, Troponin):   4.  CHF: No  5.  Dysrhythmia (new): No  6. Wound infection: No  7. Reperfusion symptoms: No  8. Return to OR: No  If yes: return to OR for (bleeding, neurologic, other CEA incision, other):  Discharge medications: Statin use:  No ASA use:  Yes   Beta blocker use:  No ACE-Inhibitor use:  Yes  ARB use:  No CCB use: No P2Y12 Antagonist use: Yes, [ x] Plavix, [ ]  Plasugrel, [ ]  Ticlopinine, [ ]  Ticagrelor, [ ]  Other, [ ]  No for medical reason, [ ]  Non-compliant, [ ]  Not-indicated Anti-coagulant use:  No, [ ]  Warfarin, [ ]  Rivaroxaban, [ ]  Dabigatran,

## 2020-05-19 NOTE — Anesthesia Postprocedure Evaluation (Signed)
Anesthesia Post Note  Patient: Christopher [name]  Procedure(s) Performed: RIGHT TRANSCAROTID ARTERY REVASCULARIZATION (Right Neck)     Patient location during evaluation: PACU Anesthesia Type: General Level of consciousness: awake and alert Pain management: pain level controlled Vital Signs Assessment: post-procedure vital signs reviewed and stable Respiratory status: spontaneous breathing, nonlabored ventilation, respiratory function stable and patient connected to nasal cannula oxygen Cardiovascular status: blood pressure returned to baseline and stable Postop Assessment: no apparent nausea or vomiting Anesthetic complications: no   No complications documented.  Last Vitals:  Vitals:   05/19/20 0000 05/19/20 0305  BP: 124/73 105/60  Pulse:  66  Resp: 14 14  Temp: 36.7 C 36.7 C  SpO2: 97% 98%    Last Pain:  Vitals:   05/19/20 0305  TempSrc: Oral  PainSc: 0-No pain                 Catalina Gravel

## 2020-05-20 LAB — TYPE AND SCREEN
ABO/RH(D): A NEG
Antibody Screen: NEGATIVE

## 2020-05-29 ENCOUNTER — Other Ambulatory Visit: Payer: Self-pay

## 2020-05-29 ENCOUNTER — Encounter: Payer: Self-pay | Admitting: Cardiology

## 2020-05-29 ENCOUNTER — Ambulatory Visit: Payer: PRIVATE HEALTH INSURANCE | Admitting: Cardiology

## 2020-05-29 VITALS — BP 109/58 | HR 70 | Ht 70.0 in | Wt 150.0 lb

## 2020-05-29 DIAGNOSIS — Z87891 Personal history of nicotine dependence: Secondary | ICD-10-CM

## 2020-05-29 DIAGNOSIS — I2510 Atherosclerotic heart disease of native coronary artery without angina pectoris: Secondary | ICD-10-CM

## 2020-05-29 DIAGNOSIS — Z794 Long term (current) use of insulin: Secondary | ICD-10-CM

## 2020-05-29 DIAGNOSIS — Z21 Asymptomatic human immunodeficiency virus [HIV] infection status: Secondary | ICD-10-CM

## 2020-05-29 DIAGNOSIS — E1165 Type 2 diabetes mellitus with hyperglycemia: Secondary | ICD-10-CM

## 2020-05-29 DIAGNOSIS — R9439 Abnormal result of other cardiovascular function study: Secondary | ICD-10-CM

## 2020-05-29 DIAGNOSIS — B20 Human immunodeficiency virus [HIV] disease: Secondary | ICD-10-CM

## 2020-05-29 DIAGNOSIS — I6523 Occlusion and stenosis of bilateral carotid arteries: Secondary | ICD-10-CM

## 2020-05-29 DIAGNOSIS — I4510 Unspecified right bundle-branch block: Secondary | ICD-10-CM

## 2020-05-29 NOTE — Progress Notes (Signed)
Christopher [name] Date of Birth: 06/09/1951 MRN: [id number] Primary Care Provider:Polite, Jori Moll, MD Primary Cardiologist: Rex Kras, DO, [facility] (established care 04/15/2020)  Date: 05/29/20 Last Visit: 04/30/2020  Chief Complaint  Patient presents with  . Status post left heart catheterization    HPI  Christopher [name] is a 69 y.o.  male who presents to the office with a chief complaint of "heart disesae follow up." Patient's past medical history and cardiovascular risk factors include: Established coronary artery disease without angina pectoris, bilateral carotid artery stenosis status post right TCAR 05/18/2020 with 10 x 40 mm Enroute stent, newly diagnosed diabetes mellitus type 2 anxiety, GERD, HIV, hypertension, hyperlipidemia, neuromuscular disorder, hypothyroidism, history of tonsillar cancer, history of TB, advanced age.  Patient was seen back in May 2021 during his hospitalization for preoperative or stratification for carotid artery intervention either percutaneous versus surgical.  During that hospitalization patient was found to have newly diagnosed insulin-dependent diabetes mellitus type 2 with serum glucose of 1400 and hemoglobin A1c of 12.9.  During the work-up he was found to have bilateral carotid artery stenosis and cardiology was asked for preoperative risk stratification.  Since then patient has undergone an echocardiogram and nuclear stress test.  Nuclear stress test was reported to be abnormal so patient underwent left heart catheterization prior to carotid intervention.  Reviewed the results of the left heart catheterization with the patient at today's office visit.  He denies any chest pain or anginal symptoms.  Medications reconciled.  Patient was on benazepril but this is currently being held secondary to lower blood pressures.  Patient underwent right transcarotid artery revascularization by Dr. Oneida Alar on May 18, 2020 using 10 x 40 mm enroute stent.  Post procedure  patient has done well without any complications.  He continues to be on aspirin, Plavix, and statin therapy.  Is an upcoming appointment with Dr. Oneida Alar.   Denies prior history of  myocardial infarction, congestive heart failure, deep venous thrombosis, pulmonary embolism, stroke, transient ischemic attack.  FUNCTIONAL STATUS: No structured exercise program or daily routine.  But is able to his activities of daily living.  ALLERGIES: Allergies  Allergen Reactions  . Codeine Other (See Comments)    "makes me crazy"  . Sulfamethoxazole-Trimethoprim Itching and Other (See Comments)    "makes me crazy"  . Sulfonamide Derivatives Itching and Other (See Comments)    "makes me crazy"     MEDICATION LIST PRIOR TO VISIT: Current Outpatient Medications on File Prior to Visit  Medication Sig Dispense Refill  . aspirin EC 81 MG EC tablet Take 1 tablet (81 mg total) by mouth daily. 30 tablet 0  . blood glucose meter kit and supplies Dispense based on patient and insurance preference. Use up to four times daily as directed. (FOR ICD-10 E10.9, E11.9). 1 each 0  . clonazePAM (KLONOPIN) 1 MG tablet TAKE 1 TABLET 3 TIMES A DAY AS NEEDED FOR ANXIETY (Patient taking differently: Take 1 mg by mouth 3 (three) times daily as needed for anxiety. ) 90 tablet 2  . clopidogrel (PLAVIX) 75 MG tablet Take 1 tablet (75 mg total) by mouth daily. 30 tablet 0  . diphenhydrAMINE (BENADRYL) 25 MG tablet Take 50 mg by mouth at bedtime as needed.     . fenofibrate (TRICOR) 145 MG tablet TAKE ONE TABLET BY MOUTH DAILY 90 tablet 0  . FLUoxetine (PROZAC) 20 MG capsule TAKE ONE CAPSULE BY MOUTH DAILY (Patient taking differently: Take 20 mg by mouth daily. ) 90 capsule  0  . folic acid (FOLVITE) 1 MG tablet Take 2 tablets (2 mg total) by mouth daily. 60 tablet 0  . HUMALOG KWIKPEN 100 UNIT/ML KwikPen Inject 1-5 Units into the skin 3 (three) times daily before meals.    . insulin glargine (LANTUS) 100 UNIT/ML Solostar Pen Inject  26 Units into the skin daily at 10 pm. 15 mL 0  . Insulin Pen Needle 32G X 8 MM MISC Use as directed 100 each 0  . levothyroxine (SYNTHROID, LEVOTHROID) 50 MCG tablet Take 1 tablet (50 mcg total) by mouth daily before breakfast. 30 tablet 0  . metoCLOPramide (REGLAN) 5 MG tablet Take 5 mg by mouth at bedtime.    . metoprolol succinate (TOPROL-XL) 25 MG 24 hr tablet Take 1 tablet (25 mg total) by mouth daily. (Patient taking differently: Take 12.5 mg by mouth daily. ) 30 tablet 0  . pantoprazole (PROTONIX) 40 MG tablet Take 40 mg by mouth daily.    . rosuvastatin (CRESTOR) 20 MG tablet Take 1 tablet (20 mg total) by mouth daily. 30 tablet 0  . temazepam (RESTORIL) 30 MG capsule Take 30 mg by mouth at bedtime.  5  . TRIUMEQ 600-50-300 MG tablet TAKE ONE TABLET BY MOUTH DAILY (Patient taking differently: Take 1 tablet by mouth daily. ) 90 tablet 3  . benazepril (LOTENSIN) 10 MG tablet Take 10 mg by mouth daily. (Patient not taking: Reported on 05/29/2020)    . [DISCONTINUED] Dolutegravir Sodium (TIVICAY) 50 MG TABS Take 1 tablet (50 mg total) by mouth daily. 30 tablet 11   No current facility-administered medications on file prior to visit.    PAST MEDICAL HISTORY: Past Medical History:  Diagnosis Date  . Anxiety   . Cataract   . Chronic kidney disease   . 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   . Diabetes mellitus without complication ([employer])   . GERD (gastroesophageal reflux disease)   . Heart murmur    CHILDHOOD  . History of kidney stones   . HIV DISEASE 12/01/2006  . HYPERLIPIDEMIA, WITH LOW HDL 01/03/2008  . Hypertension   . HYPERTENSION 12/01/2006  . HYPOGONADISM 12/23/2009  . Hypothyroidism   . Neuromuscular disorder ([place])   . SYPHILIS, [place], LATENT NOS 01/18/2007  . Thyroid disease   .  Tonsillar cancer ([place])    tonsillar ca   . Tuberculosis    history of TB about 30 years ago    PAST SURGICAL HISTORY: Past Surgical History:  Procedure Laterality Date  . COLONOSCOPY    . CORONARY ANGIOGRAPHY N/A 05/05/2020   Procedure: CORONARY ANGIOGRAPHY;  Surgeon: Nigel Mormon, MD;  Location: [facility];  Service: Cardiovascular;  Laterality: N/A;  . TONSILECTOMY, ADENOIDECTOMY, BILATERAL MYRINGOTOMY AND TUBES     tonsil cancer   . TONSILLECTOMY    . TRANSCAROTID ARTERY REVASCULARIZATION Right 05/18/2020   Procedure: RIGHT TRANSCAROTID ARTERY REVASCULARIZATION;  Surgeon: Elam Dutch, MD;  Location: [facility];  Service: Vascular;  Laterality: Right;  . UPPER GASTROINTESTINAL ENDOSCOPY      FAMILY HISTORY: The patient's family history includes COPD in his mother; Diabetes in his mother; Heart attack in his father.   SOCIAL HISTORY:  The patient  reports that he has quit smoking. He has never used smokeless tobacco. He reports current alcohol use of  about 2.0 standard drinks of alcohol per week. He reports that he does not use drugs.  Review of Systems  Constitutional: Negative for chills and fever.  HENT: Negative for congestion and nosebleeds.   Eyes: Negative for discharge, double vision and pain.  Cardiovascular: Negative for chest pain, claudication, dyspnea on exertion, leg swelling, near-syncope, orthopnea, palpitations, paroxysmal nocturnal dyspnea and syncope.  Respiratory: Negative for hemoptysis and shortness of breath.   Musculoskeletal: Negative for muscle cramps and myalgias.  Gastrointestinal: Negative for abdominal pain, constipation, diarrhea, hematemesis, hematochezia, melena, nausea and vomiting.  Neurological: Negative for dizziness and light-headedness.    PHYSICAL EXAM: Vitals with BMI 05/29/2020 05/19/2020 05/19/2020  Height 5' 10"  - -  Weight 150 lbs - -  BMI 42.59 - -  Systolic 563 875 643  Diastolic 58 70 60  Pulse 70 71 66     CONSTITUTIONAL: Frail gentleman, appears older than stated age, hemodynamically stable. No acute distress.  SKIN: Skin is warm and dry. No rash noted. No cyanosis. No pallor. No jaundice HEAD: Staples present over the left lateral cranium. Dry blood noted.  EYES: No scleral icterus MOUTH/THROAT: Moist oral membranes. Poor oral dentition.  NECK: No JVD present. No thyromegaly noted. left carotid bruits  LYMPHATIC: No visible cervical adenopathy.  CHEST Normal respiratory effort. No intercostal retractions  LUNGS: Clear to auscultation bilaterally. No stridor. No wheezes. No rales.  CARDIOVASCULAR: Regular rate and rhythm, positive P2-R5, soft systolic ejection murmur, no rubs or gallop. ABDOMINAL: No apparent ascites.  EXTREMITIES: No peripheral edema  HEMATOLOGIC: No significant bruising NEUROLOGIC: Oriented to person, place, and time. Nonfocal. Normal muscle tone.  PSYCHIATRIC: Normal mood and affect. Normal behavior. Cooperative  RADIOLOGY: CTA Head and Neck 04/14/2020: 1. 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 2. High-grade, near occlusive, stenosis of the left internal carotid artery 3 cm from the bifurcation. The distal left ICA is the more normal vessel. 3. Moderate proximal left vertebral artery stenosis. 4. Moderate left vertebral artery stenosis at the dural margin just after the left PICA originates. 5. 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 6. Moderate narrowing of the distal right P2 segment. 7. Mild generalized atrophy and white matter disease is stable. 8. 4 mm meningioma posteriorly. 9. No extra-axial hemorrhage. 10. Right temporoparietal scalp soft tissue swelling is improving. 11. Aortic Atherosclerosis (ICD10-I70.0).  CARDIAC DATABASE: EKG: 05/29/2020: Normal sinus rhythm, 71 bpm, right bundle branch block, left anterior fascicular block, without underlying ischemia or  injury pattern.  No significant change compared to prior ECG  Echocardiogram: 04/21/2020: LVEF 55 to 60%, mild LVH, normal diastolic filling pattern, mild MR.  Stress Testing:  Carlton Adam (Walking with mod Bruce)Tetrofosmin Stress Test  04/28/2020: Myocardial perfusion is abnormal. Mild degree medium extent perfusion defect consistent with scar with superimposed moderate ischemia located in the apical inferior wall, mid inferior wall and basal inferior wall (RCAregion) of left ventricle. Gated SPECT imaging of the left ventricle demonstrating hypokinesis of the apical inferior wall, mid inferior wall and basal inferior wall.  Stress LV EF is normal 58%.  No previous exam available for comparison. Intermediate risk study  Heart Catheterization: LHC on 6.15.2021 at [facility] by Dr. Virgina Jock:  LM: Mid 40% stenosis LAD: Tortuous vessel, with left-to-right collaterals to distal RCA      Prox 40% stenosis.      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  Mild diffuse disease.       Small caliber OM1 with moderate diffuse disease      Faint collaterals to distal RCA RCA: Prox CTO  Stress test abnormality due to RCA CTO with inadequate collaterals.  Patient is currently asymptomatic from CAD standpoint and is on 1 anti anginal agent. Recommend continued optimization of medical management. Okay to proceed with TCAR with acceptable moderate MACCE risk.  Carotid duplex: 04/13/2020: Right Carotid: Velocities in the right ICA are consistent with a 80-99% stenosis.  Left Carotid: Velocities in the left ICA are consistent with a 1-39% stenosis. Calcific plaque could obscure higher velocities.  Vertebrals: Bilateral vertebral arteries demonstrate antegrade flow.  Subclavians: Left subclavian artery was not visualized. Normal flow hemodynamics were seen in the right subclavian artery.   Trans carotid artery revascularization (TCAR) 05/18/2020:  90% right internal carotid artery stenosis stented to residual 0% stenosis (10 x 40 mm ENROUTE stent).   LABORATORY DATA: CBC Latest Ref Rng & Units 05/19/2020 05/13/2020 04/16/2020  WBC 4.0 - 10.5 K/uL 4.1 4.1 4.1  Hemoglobin 13.0 - 17.0 g/dL 10.4(L) 12.0(L) 11.6(L)  Hematocrit 39 - 52 % 31.7(L) 38.2(L) 34.1(L)  Platelets 150 - 400 K/uL 169 188 186    CMP Latest Ref Rng & Units 05/19/2020 05/13/2020 04/16/2020  Glucose 70 - 99 mg/dL 122(H) 163(H) 258(H)  BUN 8 - 23 mg/dL 22 23 14   Creatinine 0.61 - 1.24 mg/dL 1.28(H) 1.47(H) 1.01  Sodium 135 - 145 mmol/L 135 139 134(L)  Potassium 3.5 - 5.1 mmol/L 3.6 4.5 3.7  Chloride 98 - 111 mmol/L 102 104 99  CO2 22 - 32 mmol/L 22 25 24   Calcium 8.9 - 10.3 mg/dL 9.4 10.4(H) 9.4  Total Protein 6.5 - 8.1 g/dL - 8.8(H) 7.5  Total Bilirubin 0.3 - 1.2 mg/dL - 0.8 0.4  Alkaline Phos 38 - 126 U/L - 46 60  AST 15 - 41 U/L - 46(H) 16  ALT 0 - 44 U/L - 38 14    Lipid Panel     Component Value Date/Time   CHOL 168 04/16/2020 0438   TRIG 278 (H) 04/16/2020 0438   HDL 29 (L) 04/16/2020 0438   CHOLHDL 5.8 04/16/2020 0438   VLDL 56 (H) 04/16/2020 0438   LDLCALC 83 04/16/2020 0438   LDLCALC 105 (H) 09/20/2019 1116    Lab Results  Component Value Date   HGBA1C 12.9 (H) 04/13/2020   HGBA1C 4.9 01/03/2008   No components found for: NTPROBNP Lab Results  Component Value Date   TSH 2.037 01/07/2013   TSH 0.914 08/29/2012   TSH 0.413 08/23/2012    Cardiac Panel (last 3 results) No results for input(s): CKTOTAL, CKMB, TROPONINIHS, RELINDX in the last 72 hours.  IMPRESSION:    ICD-10-CM   1. Atherosclerosis of native coronary artery of native heart without angina pectoris  I25.10   2. Abnormal nuclear stress test  R94.39 EKG 12-Lead  3. Bilateral carotid artery stenosis, s/p right TCAR  I65.23   4. Type 2 diabetes mellitus with hyperglycemia, with long-term current use of insulin (HCC)  E11.65    Z79.4   5. Long-term insulin use (HCC)  Z79.4   6.  HIV infection, unspecified symptom status ([place])  B20   7. Former smoker  Z87.891   68. RBBB  I45.10      RECOMMENDATIONS: Christopher [name] is a 69 y.o. male whose past medical history and cardiovascular risk factors include: Established coronary artery disease without angina pectoris, bilateral carotid  artery stenosis status post right TCAR 05/18/2020 with 10 x 40 mm Enroute stent, newly diagnosed diabetes mellitus type 2 anxiety, GERD, HIV, hypertension, hyperlipidemia, neuromuscular disorder, hypothyroidism, history of tonsillar cancer, history of TB, advanced age.  Established coronary artery disease without angina pectoris:   Currently patient does not have any symptoms of angina or anginal equivalent.  Medications reconciled.  Unable to uptitrate, directed medical therapy as his blood pressures are currently soft.  Consider reinitiation of benazepril once the blood pressure improves.  Bilateral carotid artery stenosis, asymptomatic:  Continue aspirin, statin therapy, Plavix.  Currently being followed by Dr. Oneida Alar.  Insulin-dependent diabetes mellitus: Currently managed by primary team  Former smoker: Educated on the importance of continued smoking cessation.  FINAL MEDICATION LIST END OF ENCOUNTER: No orders of the defined types were placed in this encounter.   Medications Discontinued During This Encounter  Medication Reason  . HYDROcodone-acetaminophen (NORCO/VICODIN) 5-325 MG tablet Patient Preference  . traMADol (ULTRAM) 50 MG tablet Completed Course     Current Outpatient Medications:  .  aspirin EC 81 MG EC tablet, Take 1 tablet (81 mg total) by mouth daily., Disp: 30 tablet, Rfl: 0 .  blood glucose meter kit and supplies, Dispense based on patient and insurance preference. Use up to four times daily as directed. (FOR ICD-10 E10.9, E11.9)., Disp: 1 each, Rfl: 0 .  clonazePAM (KLONOPIN) 1 MG tablet, TAKE 1 TABLET 3 TIMES A DAY AS NEEDED FOR ANXIETY (Patient taking  differently: Take 1 mg by mouth 3 (three) times daily as needed for anxiety. ), Disp: 90 tablet, Rfl: 2 .  clopidogrel (PLAVIX) 75 MG tablet, Take 1 tablet (75 mg total) by mouth daily., Disp: 30 tablet, Rfl: 0 .  diphenhydrAMINE (BENADRYL) 25 MG tablet, Take 50 mg by mouth at bedtime as needed. , Disp: , Rfl:  .  fenofibrate (TRICOR) 145 MG tablet, TAKE ONE TABLET BY MOUTH DAILY, Disp: 90 tablet, Rfl: 0 .  FLUoxetine (PROZAC) 20 MG capsule, TAKE ONE CAPSULE BY MOUTH DAILY (Patient taking differently: Take 20 mg by mouth daily. ), Disp: 90 capsule, Rfl: 0 .  folic acid (FOLVITE) 1 MG tablet, Take 2 tablets (2 mg total) by mouth daily., Disp: 60 tablet, Rfl: 0 .  HUMALOG KWIKPEN 100 UNIT/ML KwikPen, Inject 1-5 Units into the skin 3 (three) times daily before meals., Disp: , Rfl:  .  insulin glargine (LANTUS) 100 UNIT/ML Solostar Pen, Inject 26 Units into the skin daily at 10 pm., Disp: 15 mL, Rfl: 0 .  Insulin Pen Needle 32G X 8 MM MISC, Use as directed, Disp: 100 each, Rfl: 0 .  levothyroxine (SYNTHROID, LEVOTHROID) 50 MCG tablet, Take 1 tablet (50 mcg total) by mouth daily before breakfast., Disp: 30 tablet, Rfl: 0 .  metoCLOPramide (REGLAN) 5 MG tablet, Take 5 mg by mouth at bedtime., Disp: , Rfl:  .  metoprolol succinate (TOPROL-XL) 25 MG 24 hr tablet, Take 1 tablet (25 mg total) by mouth daily. (Patient taking differently: Take 12.5 mg by mouth daily. ), Disp: 30 tablet, Rfl: 0 .  pantoprazole (PROTONIX) 40 MG tablet, Take 40 mg by mouth daily., Disp: , Rfl:  .  rosuvastatin (CRESTOR) 20 MG tablet, Take 1 tablet (20 mg total) by mouth daily., Disp: 30 tablet, Rfl: 0 .  temazepam (RESTORIL) 30 MG capsule, Take 30 mg by mouth at bedtime., Disp: , Rfl: 5 .  TRIUMEQ 600-50-300 MG tablet, TAKE ONE TABLET BY MOUTH DAILY (Patient taking differently: Take 1 tablet by  mouth daily. ), Disp: 90 tablet, Rfl: 3 .  benazepril (LOTENSIN) 10 MG tablet, Take 10 mg by mouth daily. (Patient not taking: Reported on  05/29/2020), Disp: , Rfl:  .  [DISCONTINUED] Dolutegravir Sodium (TIVICAY) 50 MG TABS, Take 1 tablet (50 mg total) by mouth daily., Disp: 30 tablet, Rfl: 11  Orders Placed This Encounter  Procedures  . EKG 12-Lead   --Continue cardiac medications as reconciled in final medication list. --Return in about 6 months (around 11/29/2020) for CAD follow up . Or sooner if needed. --Continue follow-up with your primary care physician regarding the management of your other chronic comorbid conditions.  Total time spent: 30 minutes. Patient's questions and concerns were addressed to his satisfaction. He voices understanding of the instructions provided during this encounter.   This note was created using a voice recognition software as a result there may be grammatical errors inadvertently enclosed that do not reflect the nature of this encounter. Every attempt is made to correct such errors.  Rex Kras, [place], [facility]  Pager: [phone] Office: [phone]

## 2020-06-04 ENCOUNTER — Other Ambulatory Visit: Payer: Self-pay | Admitting: *Deleted

## 2020-06-04 DIAGNOSIS — I6523 Occlusion and stenosis of bilateral carotid arteries: Secondary | ICD-10-CM

## 2020-06-11 ENCOUNTER — Other Ambulatory Visit: Payer: Self-pay

## 2020-06-11 ENCOUNTER — Encounter: Payer: Self-pay | Admitting: Vascular Surgery

## 2020-06-11 ENCOUNTER — Ambulatory Visit (INDEPENDENT_AMBULATORY_CARE_PROVIDER_SITE_OTHER): Payer: Self-pay | Admitting: Vascular Surgery

## 2020-06-11 ENCOUNTER — Ambulatory Visit (HOSPITAL_COMMUNITY)
Admission: RE | Admit: 2020-06-11 | Discharge: 2020-06-11 | Disposition: A | Discharge status: Home / Self Care | Payer: PRIVATE HEALTH INSURANCE | Source: Ambulatory Visit | Attending: Vascular Surgery | Admitting: Vascular Surgery

## 2020-06-11 VITALS — BP 117/68 | HR 78 | Temp 97.8°F | Resp 20 | Ht 67.0 in | Wt 157.1 lb

## 2020-06-11 DIAGNOSIS — I6523 Occlusion and stenosis of bilateral carotid arteries: Secondary | ICD-10-CM | POA: Diagnosis present

## 2020-06-11 NOTE — H&P (View-Only) (Signed)
Patient is a 69 year old male right TCAR stenting for asymptomatic radiation injury carotid stenosis.  He has a known greater than 80% left internal carotid artery stenosis as well.  Since his stent procedure he has had no new neurologic symptoms.  He has no difficulty swallowing.  He has no episodes of amaurosis.  He is continuing to take his Plavix aspirin and statin.  Physical exam:  Vitals:   06/11/20 1510 06/11/20 1513  BP: 123/67 117/68  Pulse: 78   Resp: 20   Temp: 97.8 F (36.6 C)   SpO2: 94%   Weight: 157 lb 1.6 oz (71.3 kg)   Height: 5\' 7"  (1.702 m)     Neck: Healing right neck incision  Neuro: Symmetric upper extremity lower extremity motor strength 5/5  Data: Patient had a carotid duplex ultrasound today which showed wide patency of the right internal carotid artery stent greater than 80% left internal carotid artery stenosis.  Assessment: Doing well status post right carotid stent, still has greater than 80% stenosis left internal carotid artery  Plan: Left TCAR stent June 22, 2020.  Risk benefits possible complications of procedure details including not limited to bleeding infection stroke risk 1 to 2% were discussed with patient today.  He understands and agrees to proceed.  Ruta Hinds, MD [facility] Office: [phone]

## 2020-06-11 NOTE — Progress Notes (Signed)
Patient is a 69 year old male right TCAR stenting for asymptomatic radiation injury carotid stenosis.  He has a known greater than 80% left internal carotid artery stenosis as well.  Since his stent procedure he has had no new neurologic symptoms.  He has no difficulty swallowing.  He has no episodes of amaurosis.  He is continuing to take his Plavix aspirin and statin.  Physical exam:  Vitals:   06/11/20 1510 06/11/20 1513  BP: 123/67 117/68  Pulse: 78   Resp: 20   Temp: 97.8 F (36.6 C)   SpO2: 94%   Weight: 157 lb 1.6 oz (71.3 kg)   Height: 5\' 7"  (1.702 m)     Neck: Healing right neck incision  Neuro: Symmetric upper extremity lower extremity motor strength 5/5  Data: Patient had a carotid duplex ultrasound today which showed wide patency of the right internal carotid artery stent greater than 80% left internal carotid artery stenosis.  Assessment: Doing well status post right carotid stent, still has greater than 80% stenosis left internal carotid artery  Plan: Left TCAR stent June 22, 2020.  Risk benefits possible complications of procedure details including not limited to bleeding infection stroke risk 1 to 2% were discussed with patient today.  He understands and agrees to proceed.  Ruta Hinds, MD [facility] [place] Office: [phone]

## 2020-06-16 NOTE — Progress Notes (Signed)
Your procedure is scheduled on Monday, August 2nd.  Report to [facility] Main Entrance "A" at 10:30 A.M., and check in at the Admitting office.  Call this number if you have problems the morning of surgery:  [phone]  Call [phone] if you have any questions prior to your surgery date Monday-Friday 8am-4pm   Remember:  Do not eat or drink after midnight the night before your surgery    Take these medicines the morning of surgery with A SIP OF WATER  fenofibrate (TRICOR)  FLUoxetine (PROZAC)  levothyroxine (SYNTHROID, LEVOTHROID) metoprolol succinate (TOPROL-XL) pantoprazole (PROTONIX)  rosuvastatin (CRESTOR) TRIUMEQ   If needed - clonazePAM (KLONOPIN)  Follow your surgeon's instructions on when to stop Aspirin and clopidogrel (PLAVIX).  If no instructions were given by your surgeon then you will need to call the office to get those instructions.    As of today, STOP taking Aleve, Naproxen, Ibuprofen, Motrin, Advil, Goody's, BC's, all herbal medications, fish oil, and all vitamins.          WHAT DO I DO ABOUT MY DIABETES MEDICATION?  ---The night before surgery--- Take 13 units of insulin glargine (LANTUS)  . Do not take oral diabetes medicines (pills) the morning of surgery.  . If your CBG is greater than 220 mg/dL, you may take  of your sliding scale (correction) dose of insulin.  HOW TO MANAGE YOUR DIABETES BEFORE AND AFTER SURGERY  Why is it important to control my blood sugar before and after surgery? . Improving blood sugar levels before and after surgery helps healing and can limit problems. . A way of improving blood sugar control is eating a healthy diet by: o  Eating less sugar and carbohydrates o  Increasing activity/exercise o  Talking with your doctor about reaching your blood sugar goals . High blood sugars (greater than 180 mg/dL) can raise your risk of infections and slow your recovery, so you will need to focus on controlling your diabetes during the  weeks before surgery. . Make sure that the doctor who takes care of your diabetes knows about your planned surgery including the date and location.  How do I manage my blood sugar before surgery? . Check your blood sugar at least 4 times a day, starting 2 days before surgery, to make sure that the level is not too high or low. . Check your blood sugar the morning of your surgery when you wake up and every 2 hours until you get to the Short Stay unit. o If your blood sugar is less than 70 mg/dL, you will need to treat for low blood sugar: - Do not take insulin. - Treat a low blood sugar (less than 70 mg/dL) with  cup of clear juice (cranberry or apple), 4 glucose tablets, OR glucose gel. - Recheck blood sugar in 15 minutes after treatment (to make sure it is greater than 70 mg/dL). If your blood sugar is not greater than 70 mg/dL on recheck, call [phone] for further instructions. . Report your blood sugar to the short stay nurse when you get to Short Stay.  . If you are admitted to the hospital after surgery: o Your blood sugar will be checked by the staff and you will probably be given insulin after surgery (instead of oral diabetes medicines) to make sure you have good blood sugar levels. o The goal for blood sugar control after surgery is 80-180 mg/dL.             Do not wear  jewelry.            Do not wear lotions, powders, colognes, or deodorant.            Men may shave face and neck.            Do not bring valuables to the hospital.            [facility] is not responsible for any belongings or valuables.  Do NOT Smoke (Tobacco/Vaping) or drink Alcohol 24 hours prior to your procedure If you use a CPAP at night, you may bring all equipment for your overnight stay.   Contacts, glasses, dentures or bridgework may not be worn into surgery.      For patients admitted to the hospital, discharge time will be determined by your treatment team.   Patients discharged the day of  surgery will not be allowed to drive home, and someone needs to stay with them for 24 hours.  Special instructions:   [place]- Preparing For Surgery  Before surgery, you can play an important role. Because skin is not sterile, your skin needs to be as free of germs as possible. You can reduce the number of germs on your skin by washing with CHG (chlorahexidine gluconate) Soap before surgery.  CHG is an antiseptic cleaner which kills germs and bonds with the skin to continue killing germs even after washing.    Oral Hygiene is also important to reduce your risk of infection.  Remember - BRUSH YOUR TEETH THE MORNING OF SURGERY WITH YOUR REGULAR TOOTHPASTE  Please do not use if you have an allergy to CHG or antibacterial soaps. If your skin becomes reddened/irritated stop using the CHG.  Do not shave (including legs and underarms) for at least 48 hours prior to first CHG shower. It is OK to shave your face.  Please follow these instructions carefully.   1. Shower the NIGHT BEFORE SURGERY and the MORNING OF SURGERY with CHG Soap.   2. If you chose to wash your hair, wash your hair first as usual with your normal shampoo.  3. After you shampoo, rinse your hair and body thoroughly to remove the shampoo.  4. Use CHG as you would any other liquid soap. You can apply CHG directly to the skin and wash gently with a scrungie or a clean washcloth.   5. Apply the CHG Soap to your body ONLY FROM THE NECK DOWN.  Do not use on open wounds or open sores. Avoid contact with your eyes, ears, mouth and genitals (private parts). Wash Face and genitals (private parts)  with your normal soap.   6. Wash thoroughly, paying special attention to the area where your surgery will be performed.  7. Thoroughly rinse your body with warm water from the neck down.  8. DO NOT shower/wash with your normal soap after using and rinsing off the CHG Soap.  9. Pat yourself dry with a CLEAN TOWEL.  10. Wear CLEAN PAJAMAS  to bed the night before surgery  11. Place CLEAN SHEETS on your bed the night of your first shower and DO NOT SLEEP WITH PETS.  Day of Surgery: Wear Clean/Comfortable clothing the morning of surgery Do not apply any deodorants/lotions.   Remember to brush your teeth WITH YOUR REGULAR TOOTHPASTE.   Please read over the following fact sheets that you were given.

## 2020-06-17 ENCOUNTER — Encounter (HOSPITAL_COMMUNITY): Payer: Self-pay

## 2020-06-17 ENCOUNTER — Encounter (HOSPITAL_COMMUNITY)
Admission: RE | Admit: 2020-06-17 | Discharge: 2020-06-17 | Disposition: A | Discharge status: Home / Self Care | Payer: PRIVATE HEALTH INSURANCE | Source: Ambulatory Visit | Attending: Vascular Surgery | Admitting: Vascular Surgery

## 2020-06-17 ENCOUNTER — Other Ambulatory Visit: Payer: Self-pay

## 2020-06-17 DIAGNOSIS — Z01812 Encounter for preprocedural laboratory examination: Secondary | ICD-10-CM | POA: Diagnosis not present

## 2020-06-17 LAB — COMPREHENSIVE METABOLIC PANEL
ALT: 22 U/L (ref 0–44)
AST: 38 U/L (ref 15–41)
Albumin: 4 g/dL (ref 3.5–5.0)
Alkaline Phosphatase: 44 U/L (ref 38–126)
Anion gap: 11 (ref 5–15)
BUN: 13 mg/dL (ref 8–23)
CO2: 24 mmol/L (ref 22–32)
Calcium: 9.3 mg/dL (ref 8.9–10.3)
Chloride: 104 mmol/L (ref 98–111)
Creatinine, Ser: 1.29 mg/dL — ABNORMAL HIGH (ref 0.61–1.24)
GFR calc Af Amer: >60 mL/min (ref >60)
GFR calc non Af Amer: 56 mL/min — ABNORMAL LOW (ref >60)
Glucose, Bld: 125 mg/dL — ABNORMAL HIGH (ref 70–99)
Potassium: 3.9 mmol/L (ref 3.5–5.1)
Sodium: 139 mmol/L (ref 135–145)
Total Bilirubin: 0.6 mg/dL (ref 0.3–1.2)
Total Protein: 7.8 g/dL (ref 6.5–8.1)

## 2020-06-17 LAB — TYPE AND SCREEN
ABO/RH(D): A NEG
Antibody Screen: NEGATIVE

## 2020-06-17 LAB — PROTIME-INR
INR: 1.2 (ref 0.8–1.2)
Prothrombin Time: 14.7 seconds (ref 11.4–15.2)

## 2020-06-17 LAB — CBC
HCT: 34.2 % — ABNORMAL LOW (ref 39.0–52.0)
Hemoglobin: 10.6 g/dL — ABNORMAL LOW (ref 13.0–17.0)
MCH: 31.1 pg (ref 26.0–34.0)
MCHC: 31 g/dL (ref 30.0–36.0)
MCV: 100.3 fL — ABNORMAL HIGH (ref 80.0–100.0)
Platelets: 150 10*3/uL (ref 150–400)
RBC: 3.41 MIL/uL — ABNORMAL LOW (ref 4.22–5.81)
RDW: 13.2 % (ref 11.5–15.5)
WBC: 2.7 10*3/uL — ABNORMAL LOW (ref 4.0–10.5)
nRBC: 0 % (ref 0.0–0.2)

## 2020-06-17 LAB — URINALYSIS, ROUTINE W REFLEX MICROSCOPIC
Bilirubin Urine: NEGATIVE
Glucose, UA: NEGATIVE mg/dL
Hgb urine dipstick: NEGATIVE
Ketones, ur: NEGATIVE mg/dL
Leukocytes,Ua: NEGATIVE
Nitrite: NEGATIVE
Protein, ur: NEGATIVE mg/dL
Specific Gravity, Urine: 1.015 (ref 1.005–1.030)
pH: 7 (ref 5.0–8.0)

## 2020-06-17 LAB — APTT: aPTT: 39 seconds — ABNORMAL HIGH (ref 24–36)

## 2020-06-17 LAB — SURGICAL PCR SCREEN
MRSA, PCR: NEGATIVE
Staphylococcus aureus: POSITIVE — AB

## 2020-06-17 LAB — GLUCOSE, CAPILLARY: Glucose-Capillary: 185 mg/dL — ABNORMAL HIGH (ref 70–99)

## 2020-06-17 NOTE — Progress Notes (Signed)
PCP - [name] Cardiologist - Patwardhan  Chest x-ray - n/a EKG - 05-29-20 Cardiac Cath - 05-05-20  DM - Type 2  Fasting Blood Sugar - 100-130  Blood Thinner Instructions: Follow surgeon's instructions on when to stop ASA & Plavix.  Patient stated understanding  COVID TEST- 06-19-20   Anesthesia review: yes, heart history  Patient denies shortness of breath, fever, cough and chest pain at PAT appointment   All instructions explained to the patient, with a verbal understanding of the material. Patient agrees to go over the instructions while at home for a better understanding. Patient also instructed to self quarantine after being tested for COVID-19. The opportunity to ask questions was provided.

## 2020-06-18 LAB — HEMOGLOBIN A1C
Hgb A1c MFr Bld: 6.5 % — ABNORMAL HIGH (ref 4.8–5.6)
Mean Plasma Glucose: 139.85 mg/dL

## 2020-06-18 NOTE — Anesthesia Preprocedure Evaluation (Addendum)
Anesthesia Evaluation  Patient identified by MRN, date of birth, ID band Patient awake    Reviewed: Allergy & Precautions, NPO status , Patient's Chart, lab work & pertinent test results  Airway Mallampati: I  TM Distance: >3 FB Neck ROM: Full    Dental  (+) Dental Advisory Given   Pulmonary former smoker,    Pulmonary exam normal        Cardiovascular hypertension, Pt. on medications Normal cardiovascular exam     Neuro/Psych Anxiety Depression    GI/Hepatic GERD  Medicated and Controlled,  Endo/Other  diabetes, Type 2, Insulin Dependent  Renal/GU      Musculoskeletal   Abdominal   Peds  Hematology   Anesthesia Other Findings   Reproductive/Obstetrics                             Anesthesia Physical Anesthesia Plan  ASA: III  Anesthesia Plan: General   Post-op Pain Management:    Induction: Intravenous  PONV Risk Score and Plan: 2 and Ondansetron and Midazolam  Airway Management Planned: Oral ETT  Additional Equipment: Arterial line  Intra-op Plan:   Post-operative Plan: Extubation in OR  Informed Consent: I have reviewed the patients History and Physical, chart, labs and discussed the procedure including the risks, benefits and alternatives for the proposed anesthesia with the patient or authorized representative who has indicated his/her understanding and acceptance.       Plan Discussed with: CRNA and Surgeon  Anesthesia Plan Comments: (PAT note written 06/18/2020 by Myra Gianotti, PA-C. )       Anesthesia Quick Evaluation

## 2020-06-18 NOTE — Progress Notes (Signed)
Anesthesia Chart Review:  Case: [id number] Date/Time: 06/22/20 0715   Procedure: LEFT TRANSCAROTID ARTERY REVASCULARIZATION (Left )   Anesthesia type: General   Pre-op diagnosis: BILATERAL CAROTID STENOSIS   Location: [facility] OR ROOM 52 / [place] OR   Surgeons: Elam Dutch, MD      DISCUSSION: Patient is a 69 year old male scheduled for the above procedure. He is s/p right TCAR on 05/18/20.   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   Hospitalized 04/13/20-04/16/20 after falling at home and striking the right side of his head and required staple closure in the ED. Labs showed a significantly elevated glucose of 1400 (new diagnosis, started on insulin) and Creatinine elevated at 1.9 (AKI on CKD).  Head CT showed trace amount of right hyperdense parafalcine thickening likely due to calcification and right temporoparietal region hematoma. Follow-up head CT was stable. CT c-spine showed no acute fracture but extensive carotid atherosclerosis. Carotid US showed 80-99% RICA stenosis and vascular surgeon consulted. 04/14/20 CTA neck confirmed near occlusion RICA, but also showed high grade LICA stenosis. Given history of right neck radiation and staged TCAR ultimately recommended. He underwent right TCAR of 05/18/20.  He had a preoperative cardiology evaluation by Dr. Terri Skains prior to right TCAR. Ultimately had cardiac cath following intermediate risk stress test (inferior scar with peri-infarct ischemia) that  showed non-obstructive LAD/CX disease with CTO distal RCA with faint collaterals explaining abnormal stress test. Maximize medical therapy recommended. Last visit 05/29/20 with 6 month follow-up planned.   As above, newly diagnosed with diabetes on 04/13/20 with A1c 12.9%. He is now on Humalog 1-15 Units TID with meals and Lantus 26 units Q HS and reported fasting CBGs run ~ 100-130. Repeat A1c was great at 6.5% on 06/17/20 (added to PAT labs since last A1c > 60 days).   He denied shortness of breath, cough, fever, chest pain at PAT RN visit.  Presurgical COVID-19 test is scheduled for 06/19/20. Anesthesia team to evaluate on the day of surgery. I confirmed with him that he is aware to continue ASA and Plavix per VVS instructions.   VS: BP 118/80   Pulse 67   Temp 36.6 C (Oral)   Resp 18   Ht 5' 10" (1.778 m)   Wt 69.1 kg   SpO2 99%   BMI 21.86 kg/m    PROVIDERS: [name], MD is PCP  Rex Kras, DO is cardiologist [facility]) Michel Bickers, MD is ID. As of 04/27/20 visit, HIV remained under "excllent, long-term control". Continue Triumeq and follow-up in 1 year.   LABS: Labs reviewed: Acceptable for surgery. (all labs ordered are listed, but only abnormal results are displayed)  Labs Reviewed  SURGICAL PCR SCREEN - Abnormal; Notable for the following components:      Result Value   Staphylococcus aureus POSITIVE (*)    All other components within normal limits  GLUCOSE, CAPILLARY - Abnormal; Notable for the following components:   Glucose-Capillary 185 (*)    All other components within normal limits  APTT - Abnormal; Notable for the following  components:   aPTT 39 (*)    All other components within normal limits  CBC - Abnormal; Notable for the following components:   WBC 2.7 (*)    RBC 3.41 (*)    Hemoglobin 10.6 (*)    HCT 34.2 (*)    MCV 100.3 (*)    All other components within normal limits  COMPREHENSIVE METABOLIC PANEL - Abnormal; Notable for  the following components:   Glucose, Bld 125 (*)    Creatinine, Ser 1.29 (*)    GFR calc non Af Amer 56 (*)    All other components within normal limits  PROTIME-INR  URINALYSIS, ROUTINE W REFLEX MICROSCOPIC  TYPE AND SCREEN     IMAGES: CTA head/neck 04/14/2020: IMPRESSION: 1. 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 2. High-grade, near occlusive, stenosis of the left internal carotid artery 3 cm from the bifurcation. The distal left ICA is the more normal vessel. 3. Moderate proximal left vertebral artery stenosis. 4. Moderate left vertebral artery stenosis at the dural margin just after the left PICA originates. 5. 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 6. Moderate narrowing of the distal right P2 segment. 7. Mild generalized atrophy and white matter disease is stable. 8. 4 mm meningioma posteriorly. 9. No extra-axial hemorrhage. 10. Right temporoparietal scalp soft tissue swelling is improving. 11. Aortic Atherosclerosis (ICD10-I70.0). (S/p Right TCAR 05/18/20)  1V PCXR 04/13/2020: FINDINGS: Heart is normal size. Mild peribronchial thickening. No confluent opacities or effusions. No acute bony abnormality. IMPRESSION: Mild bronchitic changes.   EKG:  EKG 05/29/2020: Normal sinus rhythm, 71 bpm, right bundle branch block, left anterior fascicular block, without underlying ischemia or injury pattern.  No significant change compared to prior ECG.   CV: Cardiac cath 05/05/2020 [facility] Esther Hardy, MD): LM: Mid 40% stenosis LAD: Tortuous vessel, with left-to-right collaterals to distal RCA Prox 40% stenosis. 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 Mild diffuse disease.  Small caliber OM1 with moderate diffuse disease Faint collaterals to distal RCA RCA: Prox CTO - Stress test abnormality due to RCA CTO  with inadequate collaterals. - Patient is currently asymptomatic from CAD standpoint and is on 1 anti anginal agent. Recommend continued optimization of medical management. Okay to proceed with TCAR with acceptable moderate MACCE risk.   Lexiscan (Walking with mod Bruce)Tetrofosmin Stress Test 04/28/2020: Nondiagnostic ECG stress. Myocardial perfusion is abnormal. Mild degree medium extent perfusion defect consistent with scar with superimposed moderate ischemia located in the apical inferior wall, mid inferior wall and basal inferior wall (Right Coronary Artery region) of left ventricle.  Gated SPECT imaging of the left ventricle demonstrating hypokinesis of the apical inferior wall, mid inferior wall and basal inferior wall. Stress LV EF is normal 58%.  No previous exam available for comparison. Intermediate risk study.   Echocardiogram 04/21/2020:  1. Normal LV systolic function with visual EF 55-60%. Left ventricle  cavity is normal in size. Mild left ventricular hypertrophy. Normal global  wall motion. Normal diastolic filling pattern. Calculated EF 55%.  2. Mild (Grade I) mitral regurgitation.  3. No other significant valvular abnormalities.  4. No prior study for comparison.   Carotid US 04/13/2020: Summary:  - Right Carotid: Velocities in the right ICA are consistent with a 80-99% stenosis.  - Left Carotid: Velocities in the left ICA are consistent with a 1-39% stenosis. Calcific plaque could obscure higher velocities.  - Vertebrals:  Bilateral vertebral arteries demonstrate antegrade flow.  - Subclavians: Left subclavian artery was not visualized. Normal flow hemodynamics were seen in the right subclavian artery. Clavicle.  (S/p Right TCAR 05/18/20)  Past Medical History:  Diagnosis Date  . Anxiety   . Cataract   . Chronic kidney disease   . 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   . Diabetes mellitus without complication ([place])   . GERD (gastroesophageal reflux disease)   . Heart murmur    CHILDHOOD  . History of kidney stones   . HIV DISEASE 12/01/2006  . HYPERLIPIDEMIA, WITH LOW HDL 01/03/2008  . Hypertension   . HYPERTENSION 12/01/2006  . HYPOGONADISM 12/23/2009  . Hypothyroidism   . Neuromuscular disorder ([place])   . SYPHILIS, [place], LATENT NOS 01/18/2007  . Thyroid disease   . Tonsillar cancer ([place])    tonsillar ca   . Tuberculosis    history of TB about 30 years ago    Past Surgical History:  Procedure Laterality Date  . COLONOSCOPY    . CORONARY ANGIOGRAPHY N/A 05/05/2020   Procedure: CORONARY ANGIOGRAPHY;  Surgeon: Nigel Mormon, MD;  Location: [facility];  Service: Cardiovascular;  Laterality: N/A;  . TONSILECTOMY, ADENOIDECTOMY, BILATERAL MYRINGOTOMY AND TUBES     tonsil cancer   . TONSILLECTOMY    . TRANSCAROTID ARTERY REVASCULARIZATION Right 05/18/2020   Procedure: RIGHT TRANSCAROTID ARTERY REVASCULARIZATION;  Surgeon: Elam Dutch, MD;  [facility]: [facility] OR;  Service: Vascular;  Laterality: Right;  . UPPER GASTROINTESTINAL ENDOSCOPY      MEDICATIONS: . aspirin EC 81 MG EC tablet  . benazepril (LOTENSIN) 10 MG tablet  . blood glucose meter kit and supplies  . clonazePAM (KLONOPIN) 1 MG tablet  . clopidogrel (PLAVIX) 75 MG tablet  . CONTOUR NEXT TEST test strip  . diphenhydrAMINE (BENADRYL) 25 MG tablet  . dolutegravir (TIVICAY) 50 MG tablet  . fenofibrate (TRICOR) 145 MG tablet  . FLUoxetine (PROZAC) 20 MG capsule  . folic acid (FOLVITE) 1 MG tablet  . HUMALOG KWIKPEN 100 UNIT/ML KwikPen  . insulin glargine (LANTUS) 100 UNIT/ML Solostar Pen  . Insulin Pen Needle 32G X 8 MM MISC  . levothyroxine (SYNTHROID, LEVOTHROID) 50 MCG tablet  . metoCLOPramide (REGLAN) 5 MG tablet  . metoprolol succinate (TOPROL-XL) 25 MG 24  hr tablet  . pantoprazole (PROTONIX) 40 MG tablet  . rosuvastatin (CRESTOR) 20 MG tablet  . temazepam (RESTORIL) 30 MG capsule  . TRIUMEQ 600-50-300 MG tablet  . [DISCONTINUED] Dolutegravir Sodium (TIVICAY) 50 MG TABS   No current facility-administered medications for this encounter.    Myra Gianotti, PA-C Surgical Short Stay/Anesthesiology [facility] Phone [phone] [facility] Phone [phone] 06/18/2020 11:05 AM

## 2020-06-19 ENCOUNTER — Other Ambulatory Visit (HOSPITAL_COMMUNITY)
Admission: RE | Admit: 2020-06-19 | Discharge: 2020-06-19 | Disposition: A | Discharge status: Home / Self Care | Payer: No Typology Code available for payment source | Source: Ambulatory Visit | Attending: Vascular Surgery | Admitting: Vascular Surgery

## 2020-06-19 DIAGNOSIS — Z20822 Contact with and (suspected) exposure to covid-19: Secondary | ICD-10-CM | POA: Insufficient documentation

## 2020-06-19 DIAGNOSIS — Z01812 Encounter for preprocedural laboratory examination: Secondary | ICD-10-CM | POA: Insufficient documentation

## 2020-06-19 LAB — SARS CORONAVIRUS 2 (TAT 6-24 HRS): SARS Coronavirus 2: NEGATIVE

## 2020-06-22 ENCOUNTER — Inpatient Hospital Stay (HOSPITAL_COMMUNITY): Payer: No Typology Code available for payment source | Admitting: Physician Assistant

## 2020-06-22 ENCOUNTER — Inpatient Hospital Stay (HOSPITAL_COMMUNITY)
Admission: RE | Admit: 2020-06-22 | Discharge: 2020-06-23 | DRG: 036 | Disposition: A | Discharge status: Home / Self Care | Payer: No Typology Code available for payment source | Attending: Vascular Surgery | Admitting: Vascular Surgery

## 2020-06-22 ENCOUNTER — Inpatient Hospital Stay (HOSPITAL_COMMUNITY): Payer: No Typology Code available for payment source | Admitting: Vascular Surgery

## 2020-06-22 ENCOUNTER — Encounter (HOSPITAL_COMMUNITY)
Admission: RE | Disposition: A | Discharge status: Home / Self Care | Payer: Self-pay | Source: Home / Self Care | Attending: Vascular Surgery

## 2020-06-22 ENCOUNTER — Encounter (HOSPITAL_COMMUNITY): Payer: Self-pay | Admitting: Vascular Surgery

## 2020-06-22 ENCOUNTER — Other Ambulatory Visit: Payer: Self-pay

## 2020-06-22 ENCOUNTER — Inpatient Hospital Stay (HOSPITAL_COMMUNITY): Payer: No Typology Code available for payment source

## 2020-06-22 DIAGNOSIS — Z87442 Personal history of urinary calculi: Secondary | ICD-10-CM | POA: Diagnosis not present

## 2020-06-22 DIAGNOSIS — Z21 Asymptomatic human immunodeficiency virus [HIV] infection status: Secondary | ICD-10-CM | POA: Diagnosis present

## 2020-06-22 DIAGNOSIS — I2510 Atherosclerotic heart disease of native coronary artery without angina pectoris: Secondary | ICD-10-CM | POA: Diagnosis present

## 2020-06-22 DIAGNOSIS — Z7902 Long term (current) use of antithrombotics/antiplatelets: Secondary | ICD-10-CM | POA: Diagnosis not present

## 2020-06-22 DIAGNOSIS — Z7982 Long term (current) use of aspirin: Secondary | ICD-10-CM | POA: Diagnosis not present

## 2020-06-22 DIAGNOSIS — Z85818 Personal history of malignant neoplasm of other sites of lip, oral cavity, and pharynx: Secondary | ICD-10-CM | POA: Diagnosis not present

## 2020-06-22 DIAGNOSIS — R29810 Facial weakness: Secondary | ICD-10-CM | POA: Diagnosis present

## 2020-06-22 DIAGNOSIS — N189 Chronic kidney disease, unspecified: Secondary | ICD-10-CM | POA: Diagnosis present

## 2020-06-22 DIAGNOSIS — Z79890 Hormone replacement therapy (postmenopausal): Secondary | ICD-10-CM

## 2020-06-22 DIAGNOSIS — Z794 Long term (current) use of insulin: Secondary | ICD-10-CM | POA: Diagnosis not present

## 2020-06-22 DIAGNOSIS — Z8611 Personal history of tuberculosis: Secondary | ICD-10-CM

## 2020-06-22 DIAGNOSIS — Z79899 Other long term (current) drug therapy: Secondary | ICD-10-CM

## 2020-06-22 DIAGNOSIS — E785 Hyperlipidemia, unspecified: Secondary | ICD-10-CM | POA: Diagnosis present

## 2020-06-22 DIAGNOSIS — F419 Anxiety disorder, unspecified: Secondary | ICD-10-CM | POA: Diagnosis present

## 2020-06-22 DIAGNOSIS — I129 Hypertensive chronic kidney disease with stage 1 through stage 4 chronic kidney disease, or unspecified chronic kidney disease: Secondary | ICD-10-CM | POA: Diagnosis present

## 2020-06-22 DIAGNOSIS — E039 Hypothyroidism, unspecified: Secondary | ICD-10-CM | POA: Diagnosis present

## 2020-06-22 DIAGNOSIS — Z882 Allergy status to sulfonamides status: Secondary | ICD-10-CM

## 2020-06-22 DIAGNOSIS — I6522 Occlusion and stenosis of left carotid artery: Secondary | ICD-10-CM | POA: Diagnosis present

## 2020-06-22 DIAGNOSIS — K219 Gastro-esophageal reflux disease without esophagitis: Secondary | ICD-10-CM | POA: Diagnosis present

## 2020-06-22 DIAGNOSIS — E1136 Type 2 diabetes mellitus with diabetic cataract: Secondary | ICD-10-CM | POA: Diagnosis present

## 2020-06-22 DIAGNOSIS — E1122 Type 2 diabetes mellitus with diabetic chronic kidney disease: Secondary | ICD-10-CM | POA: Diagnosis present

## 2020-06-22 DIAGNOSIS — I6529 Occlusion and stenosis of unspecified carotid artery: Secondary | ICD-10-CM | POA: Diagnosis present

## 2020-06-22 DIAGNOSIS — Z20822 Contact with and (suspected) exposure to covid-19: Secondary | ICD-10-CM | POA: Diagnosis present

## 2020-06-22 DIAGNOSIS — Z885 Allergy status to narcotic agent status: Secondary | ICD-10-CM | POA: Diagnosis not present

## 2020-06-22 HISTORY — PX: TRANSCAROTID ARTERY REVASCULARIZATIONÂ: SHX6778

## 2020-06-22 HISTORY — PX: ULTRASOUND GUIDANCE FOR VASCULAR ACCESS: SHX6516

## 2020-06-22 LAB — POCT ACTIVATED CLOTTING TIME: Activated Clotting Time: 252 seconds

## 2020-06-22 LAB — GLUCOSE, CAPILLARY
Glucose-Capillary: 144 mg/dL — ABNORMAL HIGH (ref 70–99)
Glucose-Capillary: 179 mg/dL — ABNORMAL HIGH (ref 70–99)
Glucose-Capillary: 161 mg/dL — ABNORMAL HIGH (ref 70–99)
Glucose-Capillary: 147 mg/dL — ABNORMAL HIGH (ref 70–99)

## 2020-06-22 SURGERY — TRANSCAROTID ARTERY REVASCULARIZATION (TCAR)
Anesthesia: General | Site: Neck | Laterality: Right

## 2020-06-22 MED ORDER — SODIUM CHLORIDE 0.9 % IV SOLN: INTRAVENOUS | Status: DC

## 2020-06-22 MED ORDER — DEXAMETHASONE SODIUM PHOSPHATE 10 MG/ML IJ SOLN
INTRAMUSCULAR | Status: DC | PRN
  Administered 2020-06-22: 5 mg via INTRAVENOUS

## 2020-06-22 MED ORDER — ABACAVIR-DOLUTEGRAVIR-LAMIVUD 600-50-300 MG PO TABS
1.0000 | ORAL_TABLET | Freq: Every day | ORAL | Status: DC
  Administered 2020-06-22 – 2020-06-23 (×2): 1 via ORAL
  Filled 2020-06-22 (×2): qty 1

## 2020-06-22 MED ORDER — CHLORHEXIDINE GLUCONATE CLOTH 2 % EX PADS: 6.0000 | MEDICATED_PAD | Freq: Once | CUTANEOUS | Status: DC

## 2020-06-22 MED ORDER — PROTAMINE SULFATE 10 MG/ML IV SOLN
INTRAVENOUS | Status: DC | PRN
  Administered 2020-06-22: 80 mg via INTRAVENOUS

## 2020-06-22 MED ORDER — FLEET ENEMA 7-19 GM/118ML RE ENEM: 1.0000 | ENEMA | Freq: Once | RECTAL | Status: DC | PRN

## 2020-06-22 MED ORDER — LIDOCAINE 2% (20 MG/ML) 5 ML SYRINGE
INTRAMUSCULAR | Status: AC
  Filled 2020-06-22: qty 5

## 2020-06-22 MED ORDER — DOLUTEGRAVIR SODIUM 50 MG PO TABS: 50.0000 mg | ORAL_TABLET | Freq: Every day | ORAL | Status: DC

## 2020-06-22 MED ORDER — IODIXANOL 320 MG/ML IV SOLN
INTRAVENOUS | Status: DC | PRN
  Administered 2020-06-22: 30 mL via INTRA_ARTERIAL

## 2020-06-22 MED ORDER — METOPROLOL SUCCINATE ER 25 MG PO TB24
25.0000 mg | ORAL_TABLET | Freq: Every day | ORAL | Status: DC
  Administered 2020-06-23: 25 mg via ORAL
  Filled 2020-06-22: qty 1

## 2020-06-22 MED ORDER — HEPARIN SODIUM (PORCINE) 5000 UNIT/ML IJ SOLN
5000.0000 [IU] | Freq: Three times a day (TID) | INTRAMUSCULAR | Status: DC
  Administered 2020-06-23: 5000 [IU] via SUBCUTANEOUS
  Filled 2020-06-22: qty 1

## 2020-06-22 MED ORDER — EPHEDRINE SULFATE-NACL 50-0.9 MG/10ML-% IV SOSY
PREFILLED_SYRINGE | INTRAVENOUS | Status: DC | PRN
  Administered 2020-06-22 (×2): 5 mg via INTRAVENOUS
  Administered 2020-06-22: 7.5 mg via INTRAVENOUS
  Administered 2020-06-22 (×2): 5 mg via INTRAVENOUS
  Administered 2020-06-22: 10 mg via INTRAVENOUS

## 2020-06-22 MED ORDER — FENTANYL CITRATE (PF) 100 MCG/2ML IJ SOLN
INTRAMUSCULAR | Status: DC | PRN
  Administered 2020-06-22: 50 ug via INTRAVENOUS
  Administered 2020-06-22: 25 ug via INTRAVENOUS
  Administered 2020-06-22: 50 ug via INTRAVENOUS

## 2020-06-22 MED ORDER — SUGAMMADEX SODIUM 200 MG/2ML IV SOLN
INTRAVENOUS | Status: DC | PRN
  Administered 2020-06-22: 150 mg via INTRAVENOUS

## 2020-06-22 MED ORDER — DEXAMETHASONE SODIUM PHOSPHATE 10 MG/ML IJ SOLN
INTRAMUSCULAR | Status: AC
  Filled 2020-06-22: qty 1

## 2020-06-22 MED ORDER — LIDOCAINE 2% (20 MG/ML) 5 ML SYRINGE
INTRAMUSCULAR | Status: DC | PRN
  Administered 2020-06-22: 100 mg via INTRAVENOUS

## 2020-06-22 MED ORDER — LEVOTHYROXINE SODIUM 50 MCG PO TABS
50.0000 ug | ORAL_TABLET | Freq: Every day | ORAL | Status: DC
  Administered 2020-06-23: 50 ug via ORAL
  Filled 2020-06-22: qty 1

## 2020-06-22 MED ORDER — FENTANYL CITRATE (PF) 100 MCG/2ML IJ SOLN
INTRAMUSCULAR | Status: AC
  Administered 2020-06-22: 50 ug
  Filled 2020-06-22: qty 2

## 2020-06-22 MED ORDER — GLYCOPYRROLATE 0.2 MG/ML IJ SOLN
INTRAMUSCULAR | Status: DC | PRN
  Administered 2020-06-22 (×2): .2 mg via INTRAVENOUS

## 2020-06-22 MED ORDER — ORAL CARE MOUTH RINSE: 15.0000 mL | Freq: Once | OROMUCOSAL | Status: AC

## 2020-06-22 MED ORDER — SODIUM CHLORIDE 0.9 % IV SOLN
INTRAVENOUS | Status: AC
  Filled 2020-06-22: qty 1.2

## 2020-06-22 MED ORDER — MIDAZOLAM HCL 2 MG/2ML IJ SOLN
INTRAMUSCULAR | Status: AC
  Filled 2020-06-22: qty 2

## 2020-06-22 MED ORDER — HEPARIN SODIUM (PORCINE) 1000 UNIT/ML IJ SOLN
INTRAMUSCULAR | Status: DC | PRN
  Administered 2020-06-22: 8000 [IU] via INTRAVENOUS

## 2020-06-22 MED ORDER — ASPIRIN EC 81 MG PO TBEC
81.0000 mg | DELAYED_RELEASE_TABLET | Freq: Every day | ORAL | Status: DC
  Administered 2020-06-22 – 2020-06-23 (×2): 81 mg via ORAL
  Filled 2020-06-22 (×2): qty 1

## 2020-06-22 MED ORDER — DIPHENHYDRAMINE HCL 25 MG PO CAPS: 50.0000 mg | ORAL_CAPSULE | Freq: Every evening | ORAL | Status: DC | PRN

## 2020-06-22 MED ORDER — SODIUM CHLORIDE 0.9 % IV SOLN
INTRAVENOUS | Status: DC | PRN
  Administered 2020-06-22: 500 mL

## 2020-06-22 MED ORDER — ALUM & MAG HYDROXIDE-SIMETH 200-200-20 MG/5ML PO SUSP: 15.0000 mL | ORAL | Status: DC | PRN

## 2020-06-22 MED ORDER — PHENYLEPHRINE HCL-NACL 10-0.9 MG/250ML-% IV SOLN
INTRAVENOUS | Status: DC | PRN
  Administered 2020-06-22: 30 ug/min via INTRAVENOUS

## 2020-06-22 MED ORDER — METOCLOPRAMIDE HCL 5 MG PO TABS
5.0000 mg | ORAL_TABLET | Freq: Every day | ORAL | Status: DC
  Administered 2020-06-22: 5 mg via ORAL
  Filled 2020-06-22: qty 1

## 2020-06-22 MED ORDER — HEPARIN SODIUM (PORCINE) 1000 UNIT/ML IJ SOLN
INTRAMUSCULAR | Status: AC
  Filled 2020-06-22: qty 1

## 2020-06-22 MED ORDER — METOPROLOL TARTRATE 5 MG/5ML IV SOLN: 2.0000 mg | INTRAVENOUS | Status: DC | PRN

## 2020-06-22 MED ORDER — CEFAZOLIN SODIUM-DEXTROSE 2-4 GM/100ML-% IV SOLN
2.0000 g | Freq: Three times a day (TID) | INTRAVENOUS | Status: AC
  Administered 2020-06-22 – 2020-06-23 (×2): 2 g via INTRAVENOUS
  Filled 2020-06-22 (×2): qty 100

## 2020-06-22 MED ORDER — ONDANSETRON HCL 4 MG/2ML IJ SOLN
INTRAMUSCULAR | Status: AC
  Filled 2020-06-22: qty 2

## 2020-06-22 MED ORDER — FOLIC ACID 1 MG PO TABS
2.0000 mg | ORAL_TABLET | Freq: Every day | ORAL | Status: DC
  Administered 2020-06-22 – 2020-06-23 (×2): 2 mg via ORAL
  Filled 2020-06-22 (×2): qty 2

## 2020-06-22 MED ORDER — SODIUM CHLORIDE 0.9 % IV SOLN: 500.0000 mL | Freq: Once | INTRAVENOUS | Status: DC | PRN

## 2020-06-22 MED ORDER — MAGNESIUM SULFATE 2 GM/50ML IV SOLN: 2.0000 g | Freq: Every day | INTRAVENOUS | Status: DC | PRN

## 2020-06-22 MED ORDER — CEFAZOLIN SODIUM-DEXTROSE 2-4 GM/100ML-% IV SOLN
2.0000 g | INTRAVENOUS | Status: AC
  Administered 2020-06-22: 2 g via INTRAVENOUS
  Filled 2020-06-22: qty 100

## 2020-06-22 MED ORDER — INSULIN ASPART 100 UNIT/ML ~~LOC~~ SOLN
0.0000 [IU] | Freq: Three times a day (TID) | SUBCUTANEOUS | Status: DC
  Administered 2020-06-22: 3 [IU] via SUBCUTANEOUS
  Administered 2020-06-23: 2 [IU] via SUBCUTANEOUS

## 2020-06-22 MED ORDER — CLOPIDOGREL BISULFATE 75 MG PO TABS
75.0000 mg | ORAL_TABLET | Freq: Every day | ORAL | Status: DC
  Administered 2020-06-22 – 2020-06-23 (×2): 75 mg via ORAL
  Filled 2020-06-22 (×2): qty 1

## 2020-06-22 MED ORDER — ROSUVASTATIN CALCIUM 20 MG PO TABS
20.0000 mg | ORAL_TABLET | Freq: Every day | ORAL | Status: DC
  Administered 2020-06-22 – 2020-06-23 (×2): 20 mg via ORAL
  Filled 2020-06-22 (×2): qty 1

## 2020-06-22 MED ORDER — FENTANYL CITRATE (PF) 250 MCG/5ML IJ SOLN
INTRAMUSCULAR | Status: AC
  Filled 2020-06-22: qty 5

## 2020-06-22 MED ORDER — ACETAMINOPHEN 650 MG RE SUPP: 325.0000 mg | RECTAL | Status: DC | PRN

## 2020-06-22 MED ORDER — ACETAMINOPHEN 325 MG PO TABS: 325.0000 mg | ORAL_TABLET | ORAL | Status: DC | PRN

## 2020-06-22 MED ORDER — ROCURONIUM BROMIDE 10 MG/ML (PF) SYRINGE
PREFILLED_SYRINGE | INTRAVENOUS | Status: AC
  Filled 2020-06-22: qty 10

## 2020-06-22 MED ORDER — LABETALOL HCL 5 MG/ML IV SOLN
10.0000 mg | INTRAVENOUS | Status: AC | PRN
  Administered 2020-06-22 (×4): 10 mg via INTRAVENOUS

## 2020-06-22 MED ORDER — ONDANSETRON HCL 4 MG/2ML IJ SOLN
INTRAMUSCULAR | Status: DC | PRN
  Administered 2020-06-22 (×2): 4 mg via INTRAVENOUS

## 2020-06-22 MED ORDER — CHLORHEXIDINE GLUCONATE CLOTH 2 % EX PADS
6.0000 | MEDICATED_PAD | Freq: Every day | CUTANEOUS | Status: DC
  Administered 2020-06-22: 6 via TOPICAL

## 2020-06-22 MED ORDER — POTASSIUM CHLORIDE CRYS ER 20 MEQ PO TBCR: 20.0000 meq | EXTENDED_RELEASE_TABLET | Freq: Every day | ORAL | Status: DC | PRN

## 2020-06-22 MED ORDER — SENNOSIDES-DOCUSATE SODIUM 8.6-50 MG PO TABS: 1.0000 | ORAL_TABLET | Freq: Every evening | ORAL | Status: DC | PRN

## 2020-06-22 MED ORDER — 0.9 % SODIUM CHLORIDE (POUR BTL) OPTIME
TOPICAL | Status: DC | PRN
  Administered 2020-06-22: 1000 mL

## 2020-06-22 MED ORDER — ESMOLOL HCL 100 MG/10ML IV SOLN
INTRAVENOUS | Status: DC | PRN
Start: 2020-06-22 — End: 2020-06-22
  Administered 2020-06-22: 10 mg via INTRAVENOUS
  Administered 2020-06-22: 20 mg via INTRAVENOUS

## 2020-06-22 MED ORDER — PANTOPRAZOLE SODIUM 40 MG PO TBEC
40.0000 mg | DELAYED_RELEASE_TABLET | Freq: Every day | ORAL | Status: DC
  Administered 2020-06-22 – 2020-06-23 (×2): 40 mg via ORAL
  Filled 2020-06-22 (×2): qty 1

## 2020-06-22 MED ORDER — LACTATED RINGERS IV SOLN: INTRAVENOUS | Status: DC

## 2020-06-22 MED ORDER — ROCURONIUM BROMIDE 100 MG/10ML IV SOLN
INTRAVENOUS | Status: DC | PRN
  Administered 2020-06-22: 20 mg via INTRAVENOUS
  Administered 2020-06-22: 100 mg via INTRAVENOUS

## 2020-06-22 MED ORDER — PROPOFOL 10 MG/ML IV BOLUS
INTRAVENOUS | Status: DC | PRN
  Administered 2020-06-22: 20 mg via INTRAVENOUS
  Administered 2020-06-22: 100 mg via INTRAVENOUS

## 2020-06-22 MED ORDER — ONDANSETRON HCL 4 MG/2ML IJ SOLN: 4.0000 mg | Freq: Four times a day (QID) | INTRAMUSCULAR | Status: DC | PRN

## 2020-06-22 MED ORDER — TEMAZEPAM 15 MG PO CAPS
30.0000 mg | ORAL_CAPSULE | Freq: Every day | ORAL | Status: DC
  Administered 2020-06-22: 30 mg via ORAL
  Filled 2020-06-22: qty 2

## 2020-06-22 MED ORDER — HYDROCODONE-ACETAMINOPHEN 5-325 MG PO TABS: 1.0000 | ORAL_TABLET | ORAL | Status: DC | PRN

## 2020-06-22 MED ORDER — FLUOXETINE HCL 20 MG PO CAPS
20.0000 mg | ORAL_CAPSULE | Freq: Every day | ORAL | Status: DC
  Administered 2020-06-22 – 2020-06-23 (×2): 20 mg via ORAL
  Filled 2020-06-22 (×2): qty 1

## 2020-06-22 MED ORDER — LIDOCAINE HCL URETHRAL/MUCOSAL 2 % EX GEL
CUTANEOUS | Status: AC
  Filled 2020-06-22: qty 11

## 2020-06-22 MED ORDER — CHLORHEXIDINE GLUCONATE 0.12 % MT SOLN
15.0000 mL | Freq: Once | OROMUCOSAL | Status: AC
  Administered 2020-06-22: 15 mL via OROMUCOSAL
  Filled 2020-06-22: qty 15

## 2020-06-22 MED ORDER — HYDROMORPHONE HCL 1 MG/ML IJ SOLN: 0.5000 mg | INTRAMUSCULAR | Status: DC | PRN

## 2020-06-22 MED ORDER — INSULIN GLARGINE 100 UNIT/ML ~~LOC~~ SOLN
26.0000 [IU] | Freq: Every day | SUBCUTANEOUS | Status: DC
  Administered 2020-06-22: 26 [IU] via SUBCUTANEOUS
  Filled 2020-06-22 (×2): qty 0.26

## 2020-06-22 MED ORDER — DOCUSATE SODIUM 100 MG PO CAPS
100.0000 mg | ORAL_CAPSULE | Freq: Every day | ORAL | Status: DC
  Administered 2020-06-23: 100 mg via ORAL
  Filled 2020-06-22: qty 1

## 2020-06-22 MED ORDER — INSULIN LISPRO (1 UNIT DIAL) 100 UNIT/ML (KWIKPEN): 1.0000 [IU] | PEN_INJECTOR | Freq: Three times a day (TID) | SUBCUTANEOUS | Status: DC

## 2020-06-22 MED ORDER — DIPHENHYDRAMINE HCL 25 MG PO TABS
50.0000 mg | ORAL_TABLET | Freq: Every evening | ORAL | Status: DC | PRN
  Filled 2020-06-22: qty 2

## 2020-06-22 MED ORDER — BISACODYL 5 MG PO TBEC: 5.0000 mg | DELAYED_RELEASE_TABLET | Freq: Every day | ORAL | Status: DC | PRN

## 2020-06-22 MED ORDER — PHENOL 1.4 % MT LIQD: 1.0000 | OROMUCOSAL | Status: DC | PRN

## 2020-06-22 MED ORDER — PROPOFOL 10 MG/ML IV BOLUS
INTRAVENOUS | Status: AC
  Filled 2020-06-22: qty 20

## 2020-06-22 MED ORDER — MIDAZOLAM HCL 5 MG/5ML IJ SOLN
INTRAMUSCULAR | Status: DC | PRN
  Administered 2020-06-22: 1 mg via INTRAVENOUS

## 2020-06-22 MED ORDER — HYDRALAZINE HCL 20 MG/ML IJ SOLN: 5.0000 mg | INTRAMUSCULAR | Status: DC | PRN

## 2020-06-22 MED ORDER — LABETALOL HCL 5 MG/ML IV SOLN
INTRAVENOUS | Status: AC
  Filled 2020-06-22: qty 4

## 2020-06-22 MED ORDER — PHENYLEPHRINE HCL (PRESSORS) 10 MG/ML IV SOLN
INTRAVENOUS | Status: AC
  Filled 2020-06-22: qty 1

## 2020-06-22 MED ORDER — BENAZEPRIL HCL 5 MG PO TABS
10.0000 mg | ORAL_TABLET | Freq: Every day | ORAL | Status: DC
  Administered 2020-06-22 – 2020-06-23 (×2): 10 mg via ORAL
  Filled 2020-06-22 (×2): qty 2

## 2020-06-22 MED ORDER — CLONAZEPAM 1 MG PO TABS: 1.0000 mg | ORAL_TABLET | Freq: Three times a day (TID) | ORAL | Status: DC | PRN

## 2020-06-22 MED ORDER — GUAIFENESIN-DM 100-10 MG/5ML PO SYRP: 15.0000 mL | ORAL_SOLUTION | ORAL | Status: DC | PRN

## 2020-06-22 SURGICAL SUPPLY — 74 items
BAG BANDED W/RUBBER/TAPE 36X54 (MISCELLANEOUS) ×3 IMPLANT
BALLN STERLING RX 5X30X80 (BALLOONS) ×3
BALLOON STERLING RX 5X30X80 (BALLOONS) ×2 IMPLANT
CANISTER SUCT 3000ML PPV (MISCELLANEOUS) ×3 IMPLANT
CANNULA VESSEL 3MM 2 BLNT TIP (CANNULA) ×3 IMPLANT
CATH BEACON 5 .035 40 KMP TP (CATHETERS) ×2 IMPLANT
CATH BEACON 5 .038 40 KMP TP (CATHETERS) ×3
CATH ROBINSON RED A/P 18FR (CATHETERS) IMPLANT
CLIP VESOCCLUDE MED 6/CT (CLIP) ×3 IMPLANT
CLIP VESOCCLUDE SM WIDE 6/CT (CLIP) ×3 IMPLANT
COVER DOME SNAP 22 D (MISCELLANEOUS) ×3 IMPLANT
COVER PROBE W GEL 5X96 (DRAPES) ×3 IMPLANT
COVER WAND RF STERILE (DRAPES) ×3 IMPLANT
DECANTER SPIKE VIAL GLASS SM (MISCELLANEOUS) IMPLANT
DERMABOND ADVANCED (GAUZE/BANDAGES/DRESSINGS) ×1
DERMABOND ADVANCED .7 DNX12 (GAUZE/BANDAGES/DRESSINGS) ×2 IMPLANT
DRAIN HEMOVAC 1/8 X 5 (WOUND CARE) IMPLANT
DRAPE FEMORAL ANGIO 80X135IN (DRAPES) ×3 IMPLANT
DRAPE INCISE IOBAN 66X45 STRL (DRAPES) ×6 IMPLANT
DRSG TEGADERM 2-3/8X2-3/4 SM (GAUZE/BANDAGES/DRESSINGS) ×3 IMPLANT
ELECT REM PT RETURN 9FT ADLT (ELECTROSURGICAL) ×3
ELECTRODE REM PT RTRN 9FT ADLT (ELECTROSURGICAL) ×2 IMPLANT
EVACUATOR SILICONE 100CC (DRAIN) IMPLANT
GAUZE SPONGE 2X2 8PLY STRL LF (GAUZE/BANDAGES/DRESSINGS) ×2 IMPLANT
GLOVE BIO SURGEON STRL SZ7.5 (GLOVE) ×3 IMPLANT
GOWN STRL REUS W/ TWL LRG LVL3 (GOWN DISPOSABLE) ×6 IMPLANT
GOWN STRL REUS W/TWL LRG LVL3 (GOWN DISPOSABLE) ×9
GUIDEWIRE ENROUTE 0.014 (WIRE) ×3 IMPLANT
HEMOSTAT SPONGE AVITENE ULTRA (HEMOSTASIS) IMPLANT
INTRODUCER KIT GALT 7CM (INTRODUCER) ×3
KIT BASIN OR (CUSTOM PROCEDURE TRAY) ×3 IMPLANT
KIT ENCORE 26 ADVANTAGE (KITS) ×3 IMPLANT
KIT INTRODUCER GALT 7 (INTRODUCER) ×2 IMPLANT
KIT TURNOVER KIT B (KITS) ×3 IMPLANT
NEEDLE HYPO 25GX1X1/2 BEV (NEEDLE) IMPLANT
NEEDLE PERC 18GX7CM (NEEDLE) ×3 IMPLANT
NS IRRIG 1000ML POUR BTL (IV SOLUTION) ×6 IMPLANT
PACK CAROTID (CUSTOM PROCEDURE TRAY) ×3 IMPLANT
PACK UNIVERSAL I (CUSTOM PROCEDURE TRAY) ×3 IMPLANT
PAD ARMBOARD 7.5X6 YLW CONV (MISCELLANEOUS) ×6 IMPLANT
POSITIONER HEAD DONUT 9IN (MISCELLANEOUS) ×3 IMPLANT
PROTECTION STATION PRESSURIZED (MISCELLANEOUS) ×3
SET MICROPUNCTURE 5F STIFF (MISCELLANEOUS) ×3 IMPLANT
SHEATH AVANTI 11CM 5FR (SHEATH) IMPLANT
SHUNT CAROTID BYPASS 10 (VASCULAR PRODUCTS) IMPLANT
SHUNT CAROTID BYPASS 12FRX15.5 (VASCULAR PRODUCTS) IMPLANT
SPONGE GAUZE 2X2 STER 10/PKG (GAUZE/BANDAGES/DRESSINGS) ×1
STATION PROTECTION PRESSURIZED (MISCELLANEOUS) ×2 IMPLANT
STENT TRANSCAROTID SYSTEM 9X30 (Permanent Stent) ×3 IMPLANT
STENT TRANSCAROTID SYSTEM 9X40 (Permanent Stent) ×3 IMPLANT
STOPCOCK MORSE 400PSI 3WAY (MISCELLANEOUS) ×3 IMPLANT
SUT ETHILON 3 0 PS 1 (SUTURE) IMPLANT
SUT PROLENE 5 0 C 1 24 (SUTURE) ×9 IMPLANT
SUT PROLENE 6 0 CC (SUTURE) ×3 IMPLANT
SUT SILK 2 0 PERMA HAND 18 BK (SUTURE) ×3 IMPLANT
SUT SILK 2 0 SH (SUTURE) ×3 IMPLANT
SUT SILK 2 0SH CR/8 30 (SUTURE) ×3 IMPLANT
SUT SILK 3 0 TIES 17X18 (SUTURE)
SUT SILK 3-0 18XBRD TIE BLK (SUTURE) IMPLANT
SUT VIC AB 3-0 SH 27 (SUTURE) ×3
SUT VIC AB 3-0 SH 27X BRD (SUTURE) ×2 IMPLANT
SUT VICRYL 4-0 PS2 18IN ABS (SUTURE) ×3 IMPLANT
SYR 10ML LL (SYRINGE) ×9 IMPLANT
SYR 20ML LL LF (SYRINGE) ×3 IMPLANT
SYR 30ML LL (SYRINGE) ×3 IMPLANT
SYR 5ML LL (SYRINGE) ×3 IMPLANT
SYR CONTROL 10ML LL (SYRINGE) IMPLANT
SYSTEM TRANSCAROTID NEUROPRTCT (MISCELLANEOUS) ×2 IMPLANT
TOWEL GREEN STERILE (TOWEL DISPOSABLE) ×3 IMPLANT
TRANSCAROTID NEUROPROTECT SYS (MISCELLANEOUS) ×3
TUBING EXTENTION W/L.L. (IV SETS) ×3 IMPLANT
WATER STERILE IRR 1000ML POUR (IV SOLUTION) ×3 IMPLANT
WIRE AMPLATZ SS-J .035X180CM (WIRE) IMPLANT
WIRE BENTSON .035X145CM (WIRE) ×3 IMPLANT

## 2020-06-22 NOTE — Op Note (Signed)
Procedure: Trans carotid artery revascularization (TCAR) left carotid stent  Preoperative diagnosis: Asymptomatic left internal carotid artery stenosis  Postoperative diagnosis: Same  Anesthesia: Gen.  Assistant:  Gerri Lins, PA-c necessary for expediting procedure and manipulation of wires/catheters  Indications: Patient is an 69 year old male with > 80% left interanal carotid artery with prior neck radiation  Operative findings: #1 80% left internal carotid artery stenosis stented to residual less than 30% stenosis (9 x 40 mm, 9 x 30 mm ENROUTE stent)  #2 right femoral venous access  Operative details: After obtaining informed consent, the patient was taken the operating room. The patient was placed in supine position upper table. After induction of general anesthesia and endotracheal intubation, the patient's left neck and chest were prepped and draped in usual sterile fashion. The right groin was also prepped and draped in usual sterile fashion. A time out was performed. Next a oblique incision was made between the heads of the sternocleidomastoid muscle on the left side of the neck. Incision was carried down through the platysma to the level of the left internal jugular vein.  There was some scar of the tissues and the vagus was not clearly identified so I tried to stay right on the surface of the common carotid. This vein was reflected laterally. Common carotid artery was dissected free circumferentially at the base of the incision. This was elevated up in the operative field with an vessel loop. Approximately 3-4 cm of the artery was dissected free circumferentially to allow adequate exposure. A pursestring suture was placed in the artery at the area we were planning to puncture using a running 6-0 Prolene suture.At this point femoral venous access was established via the right groin. Ultrasound was used to identify the right common femoral vein. An introducer needle was used to cannulate  the right common femoral vein and an 035 Bentsen wire advanced into the vein and the flow reversal sheath placed over this. It was thoroughly flushed with heparinized saline. The patient was given 8,000 units of total heparin to establish an ACT greater than 250. At this point a micropuncture needle was used to cannulate the left common carotid artery. The micropuncture wire was advanced just below the level of the carotid bifurcation. The micropuncture sheath was then advanced over this about 3 cm into the common carotid artery. This was thoroughly flushed with heparinized saline.a contrast angiogram was then performed in AP projection to confirm that we were truly intraluminal with the sheath. This was also used to determine the level of the carotid bifurcation. The carotid Amplatz wire was then placed through the micropuncture sheath and the sheath removed. The 8 French flow reversal arterial sheath was then advanced over the guidewire and into the common carotid artery. Sheath was sutured to the skin with 3 2-0 silk sutures.Contrast angiogram was again performed to make sure that we were within the true lumen. This was done in 2 views.  The filter device was switched to the low-flow selection. The flow reversal system was then hooked up to the arterial end of the sheath after everything had been thoroughly flushed and de-aired. Passive flow was used to fill the arterial and of the filter and through the filter and up to the level of the venous sheath. The venous sheath was also fully de-aired and passive flow was established. This was checked by saline infusion in the venous port and noted to flush easily. High flow was then turned on on the filter device. At this point  a TCAR timeout was performed to make sure that the patient's blood pressure was reasonable. It was systolic 199 at this point. We again confirmed that the ACT was above 250. The balloon and guidewire insufflator and stent were all selected.  These were all prepped. A 5 x 30 mm angioplasty balloon had been selected based on the preoperative CT. Based on the angiogram intraoperatively as well as preoperative CT and 9 x 40 mm Enroute stent was selected. The 014 wire was slightly shaped opening and due to slight curvature of the internal carotid artery.  Additionally a Berenstein catheter was used to direct the catheter towards the internal carotid artery.  I was able to advance the wire up into the petrous portion of the internal carotid artery.   At this point the 5 x 30 balloon was centered on the lesion and inflated to 8 atm slowly inflating and deflating the balloon.  There was a tandem stenosis so the balloon was then deflated and reinflated at the more proximal lesion in similar fashion.  After predilatation we then brought up the 9 x 40 Enroute stent and advanced and centered this on the lesion.  This was then deployed using a pinch technique after opening the Tuohy Borst valve.  We then gave the patient 2 minutes of additional flow reversal before performing a completion angiogram.  Completion angiogram showed no residual waist.  However the stent was not long enough to extend completely into the common carotid and there was concerned that we had not completely covered the proximal aspect of the lesion.  Therefore an additional 9 x 30 stent was brought up and centered extending into the common carotid artery and deployed.  An AP and lateral angiogram were performed to confirm that the stent was fully deployed..  There was no dissection and the stent was well apposed.  At this point the guidewire was removed. The flow reversal system was clamped off at the arterial sheath and disconnected. The remaining blood was returned to the patient. This was then disconnected from the venous sheath. The arterial sheath was removed and the pursestring suture cinched down. The patient was given 80 mg of protamine to achieve hemostasis. The venous sheath was  pulled and hemostasis obtained with direct pressure. The carotid was inspected found to be without any area of hematoma or active bleeding. The platysma muscle was reapproximated using a running 3-0 Vicryl suture. The skin was closed with a 4-0 Vicryl subcuticular stitch. Dermabond was applied to the incision. Patient was awakened in the operating room and moving her upper and lower extremities symmetrically.  He was taken to the recovery room in stable condition.  Ruta Hinds, MD [facility] Office: [phone] Pager: [phone]

## 2020-06-22 NOTE — Progress Notes (Signed)
Bowman RN Spoke with dr Conrad Simpson re bladder scan of >900 mls/ orders to I/o cath

## 2020-06-22 NOTE — Discharge Instructions (Signed)
   [facility]  Discharge Instructions   Carotid Endarterectomy (CEA)  Please refer to the following instructions for your post-procedure care. Your surgeon or physician assistant will discuss any changes with you.  Activity  You are encouraged to walk as much as you can. You can slowly return to normal activities but must avoid strenuous activity and heavy lifting until your doctor tell you it's OK. Avoid activities such as vacuuming or swinging a golf club. You can drive after one week if you are comfortable and you are no longer taking prescription pain medications. It is normal to feel tired for serval weeks after your surgery. It is also normal to have difficulty with sleep habits, eating, and bowel movements after surgery. These will go away with time.  Bathing/Showering  You may shower after you come home. Do not soak in a bathtub, hot tub, or swim until the incision heals completely.  Incision Care  Shower every day. Clean your incision with mild soap and water. Pat the area dry with a clean towel. You do not need a bandage unless otherwise instructed. Do not apply any ointments or creams to your incision. You may have skin glue on your incision. Do not peel it off. It will come off on its own in about one week. Your incision may feel thickened and raised for several weeks after your surgery. This is normal and the skin will soften over time. For Men Only: It's OK to shave around the incision but do not shave the incision itself for 2 weeks. It is common to have numbness under your chin that could last for several months.  Diet  Resume your normal diet. There are no special food restrictions following this procedure. A low fat/low cholesterol diet is recommended for all patients with vascular disease. In order to heal from your surgery, it is CRITICAL to get adequate nutrition. Your body requires vitamins, minerals, and protein. Vegetables are the best  source of vitamins and minerals. Vegetables also provide the perfect balance of protein. Processed food has little nutritional value, so try to avoid this.        Medications  Resume taking all of your medications unless your doctor or physician assistant tells you not to. If your incision is causing pain, you may take over-the- counter pain relievers such as acetaminophen (Tylenol). If you were prescribed a stronger pain medication, please be aware these medications can cause nausea and constipation. Prevent nausea by taking the medication with a snack or meal. Avoid constipation by drinking plenty of fluids and eating foods with a high amount of fiber, such as fruits, vegetables, and grains. Do not take Tylenol if you are taking prescription pain medications.  Follow Up  Our office will schedule a follow up appointment 2-3 weeks following discharge.  Please call us immediately for any of the following conditions  Increased pain, redness, drainage (pus) from your incision site. Fever of 101 degrees or higher. If you should develop stroke (slurred speech, difficulty swallowing, weakness on one side of your body, loss of vision) you should call 911 and go to the nearest emergency room.  Reduce your risk of vascular disease:  Stop smoking. If you would like help call [facility] at 1-800-QUIT-NOW (1-800-784-8669) or [place] at 336-586-4000. Manage your cholesterol Maintain a desired weight Control your diabetes Keep your blood pressure down  If you have any questions, please call the office at [phone].   

## 2020-06-22 NOTE — Plan of Care (Signed)
  Problem: Health Behavior/Discharge Planning: Goal: Ability to manage health-related needs will improve Outcome: Progressing   Problem: Nutrition: Goal: Adequate nutrition will be maintained Outcome: Progressing  Gag reflex checked, patient advanced to cardiac/carb modified diet.

## 2020-06-22 NOTE — Transfer of Care (Signed)
Immediate Anesthesia Transfer of Care Note  Patient: Christopher [name]  Procedure(s) Performed: LEFT TRANSCAROTID ARTERY REVASCULARIZATION (Left Neck) ULTRASOUND GUIDANCE FOR VASCULAR ACCESS (Right Groin)  Patient Location: PACU  Anesthesia Type:General  Level of Consciousness: awake, alert  and oriented  Airway & Oxygen Therapy: Patient Spontanous Breathing and Patient connected to face mask oxygen  Post-op Assessment: Report given to RN and Post -op Vital signs reviewed and stable  Post vital signs: Reviewed and stable  Last Vitals:  Vitals Value Taken Time  BP 143/77 06/22/20 1010  Temp    Pulse 84 06/22/20 1016  Resp 18 06/22/20 1016  SpO2 95 % 06/22/20 1016  Vitals shown include unvalidated device data.  Last Pain:  Vitals:   06/22/20 0617  TempSrc:   PainSc: 0-No pain      Patients Stated Pain Goal: 4 (45/03/88 8280)  Complications: No complications documented.

## 2020-06-22 NOTE — Anesthesia Procedure Notes (Signed)
Procedure Name: Intubation Date/Time: 06/22/2020 7:52 AM Performed by: Gwyndolyn Saxon, CRNA Pre-anesthesia Checklist: Patient identified, Emergency Drugs available, Suction available and Patient being monitored Patient Re-evaluated:Patient Re-evaluated prior to induction Oxygen Delivery Method: Circle system utilized Preoxygenation: Pre-oxygenation with 100% oxygen Induction Type: IV induction Ventilation: Mask ventilation without difficulty Laryngoscope Size: Glidescope and 4 Grade View: Grade I Tube type: Oral Tube size: 7.5 mm Number of attempts: 2 Airway Equipment and Method: Patient positioned with wedge pillow,  Stylet and Video-laryngoscopy Placement Confirmation: ETT inserted through vocal cords under direct vision,  positive ETCO2 and breath sounds checked- equal and bilateral Secured at: 22 cm Tube secured with: Tape Dental Injury: Teeth and Oropharynx as per pre-operative assessment  Difficulty Due To: Difficult Airway- due to anterior larynx, Difficult Airway- due to dentition and Difficult Airway- due to reduced neck mobility Comments: DL #1 with miller 3; short, immobile epiglottis and no view. DL #2 with glidescope. Grade 1. Poor dentiton preop; baseline after intubation

## 2020-06-22 NOTE — Anesthesia Postprocedure Evaluation (Signed)
Anesthesia Post Note  Patient: Talmage Coin Sizelove  Procedure(s) Performed: LEFT TRANSCAROTID ARTERY REVASCULARIZATION (Left Neck) ULTRASOUND GUIDANCE FOR VASCULAR ACCESS (Right Groin)     Patient location during evaluation: PACU Anesthesia Type: General Level of consciousness: awake and alert Pain management: pain level controlled Vital Signs Assessment: post-procedure vital signs reviewed and stable Respiratory status: spontaneous breathing, nonlabored ventilation, respiratory function stable and patient connected to nasal cannula oxygen Cardiovascular status: blood pressure returned to baseline and stable Postop Assessment: no apparent nausea or vomiting Anesthetic complications: no   No complications documented.  Last Vitals:  Vitals:   06/22/20 1148 06/22/20 1324  BP:  122/72  Pulse: 76 73  Resp: 13 17  Temp:  36.7 C  SpO2: 95% 98%    Last Pain:  Vitals:   06/22/20 1324  TempSrc: Oral  PainSc: 0-No pain                 Kamla Skilton DAVID

## 2020-06-22 NOTE — Interval H&P Note (Signed)
History and Physical Interval Note:  06/22/2020 7:26 AM  Christopher [name]  has presented today for surgery, with the diagnosis of BILATERAL CAROTID STENOSIS.  The various methods of treatment have been discussed with the patient and family. After consideration of risks, benefits and other options for treatment, the patient has consented to  Procedure(s): LEFT TRANSCAROTID ARTERY REVASCULARIZATION (Left) as a surgical intervention.  The patient's history has been reviewed, patient examined, no change in status, stable for surgery.  I have reviewed the patient's chart and labs.  Questions were answered to the patient's satisfaction.     Ruta Hinds

## 2020-06-23 ENCOUNTER — Encounter (HOSPITAL_COMMUNITY): Payer: Self-pay | Admitting: Vascular Surgery

## 2020-06-23 LAB — CBC
HCT: 29.8 % — ABNORMAL LOW (ref 39.0–52.0)
Hemoglobin: 9.4 g/dL — ABNORMAL LOW (ref 13.0–17.0)
MCH: 31 pg (ref 26.0–34.0)
MCHC: 31.5 g/dL (ref 30.0–36.0)
MCV: 98.3 fL (ref 80.0–100.0)
Platelets: 149 10*3/uL — ABNORMAL LOW (ref 150–400)
RBC: 3.03 MIL/uL — ABNORMAL LOW (ref 4.22–5.81)
RDW: 13.2 % (ref 11.5–15.5)
WBC: 3.8 10*3/uL — ABNORMAL LOW (ref 4.0–10.5)
nRBC: 0 % (ref 0.0–0.2)

## 2020-06-23 LAB — BASIC METABOLIC PANEL
Anion gap: 8 (ref 5–15)
BUN: 11 mg/dL (ref 8–23)
CO2: 26 mmol/L (ref 22–32)
Calcium: 9.2 mg/dL (ref 8.9–10.3)
Chloride: 102 mmol/L (ref 98–111)
Creatinine, Ser: 1.23 mg/dL (ref 0.61–1.24)
GFR calc Af Amer: >60 mL/min (ref >60)
GFR calc non Af Amer: 59 mL/min — ABNORMAL LOW (ref >60)
Glucose, Bld: 118 mg/dL — ABNORMAL HIGH (ref 70–99)
Potassium: 3.3 mmol/L — ABNORMAL LOW (ref 3.5–5.1)
Sodium: 136 mmol/L (ref 135–145)

## 2020-06-23 LAB — GLUCOSE, CAPILLARY
Glucose-Capillary: 123 mg/dL — ABNORMAL HIGH (ref 70–99)
Glucose-Capillary: 123 mg/dL — ABNORMAL HIGH (ref 70–99)

## 2020-06-23 LAB — HEMOGLOBIN A1C
Hgb A1c MFr Bld: 6.2 % — ABNORMAL HIGH (ref 4.8–5.6)
Mean Plasma Glucose: 131.24 mg/dL

## 2020-06-23 MED ORDER — HYDROCODONE-ACETAMINOPHEN 5-325 MG PO TABS: 1.0000 | ORAL_TABLET | Freq: Four times a day (QID) | ORAL | 0 refills | Status: DC | PRN

## 2020-06-23 NOTE — Progress Notes (Signed)
  Neuro signs checked q 2 hrs. Pt stated that facial drooping on left side is his baseline before this surgery. Otherwise no acute neuro deficits or changed from his baseline. He's hemodynamically stable. He remained afebrile. Incision on left neck stayed clean and dry, no hematoma.  His foley cath inserted by urologist per RN ay shift report, we may need to verified this am before discontinue Foley cath.  No immediate distress noted.    06/23/20 0356  Charting Type  Charting Type Reassessment  Orders Chart Check (once per shift) Completed  [place] Work Intensity Score (Update with each assessment and as needed)  Work Intensity Score (Level) 3  Level 3 Intensity B.Frequent assistance requests (patient or family);C.High risk for fall/injury  NOT following commands;W.Meds schedule and prns every 1-2 hours;S.Neuro checks Q2 hrs  Neurological  Neuro (WDL) X  Orientation Level Oriented X4  Cognition Appropriate at baseline;Appropriate attention/concentration;Appropriate judgement;Appropriate safety awareness;Appropriate for developmental age;Follows Risk manager;Appropriate at baseline;Appropriate for developmental age  Motor Function/Sensation Assessment Motor strength;Sensation;Motor response;Leg ataxia;Arm ataxia;Plantar flexion;Dorsiflexion;Pronator drift;Elbow flexion;Elbow extension;Grip  Facial Symmetry Asymmetrical left  Facial Droop Left  R Hand Grip Strong  L Hand Grip Strong  R Elbow Extension (Push/Biceps) Strong  L Elbow Extension (Push/Biceps) Strong  R Elbow Flexion (Pull/Triceps) Strong  L Elbow Flexion (Pull/Triceps) Strong  Right Pronator Drift Absent  Left Pronator Drift Absent  R Foot Dorsiflexion Strong  L Foot Dorsiflexion Strong  R Foot Plantar Flexion Strong  L Foot Plantar Flexion Strong  R Finger to Nose (Point to [employer]) Smooth  L Finger to Nose (Point to [employer]) Smooth  R Heel to [employer] (Point to [employer]) Smooth  L Heel to [employer] to  [employer]) Smooth  RUE Motor Response Purposeful movement  RUE Sensation Full sensation  RUE Motor Strength 5  LUE Motor Response Purposeful movement  LUE Sensation Full sensation  LUE Motor Strength 5  RLE Motor Response Purposeful movement  RLE Sensation Numbness;Tingling  RLE Motor Strength 5  LLE Motor Response Purposeful movement  LLE Sensation Tingling;Numbness  LLE Motor Strength 5  Neuro Symptoms Fatigue  Neuro symptoms relieved by Rest;Relaxation techniques (Comment)  Glasgow Coma Scale  Eye Opening 4  Best Verbal Response (NON-intubated) 5  Best Motor Response 6  Glasgow Coma Scale Score 15  NIH Stroke Scale ( + Modified Stroke Scale Criteria)   Interval Other (Comment) (q 2 hr)  Level of Consciousness (1a.)    0  LOC Questions (1b. )   + 0  LOC Commands (1c. )   +  0  Best Gaze (2. )  + 0  Visual (3. )  + 0  Facial Palsy (4. )     1  Motor Arm, Left (5a. )   + 0  Motor Arm, Right (5b. )   + 0  Motor Leg, Left (6a. )   + 0  Motor Leg, Right (6b. )   + 0  Limb Ataxia (7. ) 0  Sensory (8. )   + 0  Best Language (9. )   + 0  Dysarthria (10. ) 0  Extinction/Inattention (11.)   + 0  Modified SS Total  + 0  Complete NIHSS TOTAL 1  Neurological  Level of Consciousness Alert   Continue to monitor. Kennyth Lose, RN

## 2020-06-23 NOTE — Progress Notes (Signed)
Patient's IV d/c'd, discharge packet reviewed and no further questions from patient and family. Patient dressed and taken downstairs in wheelchair. Walked to personal vehicle safely.

## 2020-06-23 NOTE — Plan of Care (Signed)

## 2020-06-23 NOTE — Progress Notes (Addendum)
[facility]  Subjective  - Doing well without much discomfort.  He is eger to go home and wants the foley discontinued.    Objective 136/76 72 98 F (36.7 C) (Oral) 16 97%  Intake/Output Summary (Last 24 hours) at 06/23/2020 0731 Last data filed at 06/23/2020 0400 Gross per 24 hour  Intake 2482.55 ml  Output 2725 ml  Net -242.45 ml    Left subclavian incision healing well without hematoma. Right groin soft, healing well Baseline tongue deviation to left and facial drooping on the right. Moving all 4 ext. Heart RRR  Assessment/Planning: POD # 1 left TCAR  Will D/C foley for trial of independent voiding before he can be D/C'd home.   F/U in 2-3 weeks with carotid duplex.  Roxy Horseman 06/23/2020 7:31 AM --   Agree with above.  Neuro at baseline no hematoma D/c home if he can void spontaneously Plavix asa statin  Ruta Hinds, MD [facility] Office: [phone]  Laboratory Lab Results: Recent Labs    06/23/20 0615  WBC 3.8*  HGB 9.4*  HCT 29.8*  PLT 149*   BMET Recent Labs    06/23/20 0615  NA 136  K 3.3*  CL 102  CO2 26  GLUCOSE 118*  BUN 11  CREATININE 1.23  CALCIUM 9.2    COAG Lab Results  Component Value Date   INR 1.2 06/17/2020   INR 1.1 05/13/2020   INR 1.08 07/07/2010   No results found for: PTT

## 2020-06-30 NOTE — Discharge Summary (Signed)
[facility] Discharge Summary   Patient ID:  Christopher [name] MRN: [id number] DOB/AGE: [id number] 70 y.o.  Admit date: 06/22/2020 Discharge date: 06/23/2020 Date of Surgery: 06/22/2020 Surgeon: Surgeon(s): Elam Dutch, MD  Admission Diagnosis: Carotid stenosis [I65.29]  Discharge Diagnoses:  Carotid stenosis [I65.29]  Secondary Diagnoses: Past Medical History:  Diagnosis Date  . Anxiety   . Cataract   . Chronic kidney disease   . 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   . Diabetes mellitus without complication ([place])   . GERD (gastroesophageal reflux disease)   . Heart murmur    CHILDHOOD  . History of kidney stones   . HIV DISEASE 12/01/2006  . HYPERLIPIDEMIA, WITH LOW HDL 01/03/2008  . Hypertension   . HYPERTENSION 12/01/2006  . HYPOGONADISM 12/23/2009  . Hypothyroidism   . Neuromuscular disorder ([place])   . SYPHILIS, [place], LATENT NOS 01/18/2007  . Thyroid disease   . 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 Tuberculosis    history of TB about 30 years ago    Procedure(s): LEFT TRANSCAROTID ARTERY REVASCULARIZATION ULTRASOUND GUIDANCE FOR VASCULAR ACCESS  Discharged Condition: good  HPI: Patient is a 69 year old male right TCAR stenting for asymptomatic radiation injury carotid stenosis.  He has a known greater than 80% left internal carotid artery stenosis as well.  Since his stent procedure he has had no new neurologic symptoms.  He has no difficulty swallowing.  He has no episodes of amaurosis.  He is continuing to take his Plavix aspirin and statin.   Hospital Course:  Christopher [name] is a 69 y.o. male is S/P  Procedure(s): LEFT TRANSCAROTID ARTERY REVASCULARIZATION ULTRASOUND GUIDANCE FOR VASCULAR ACCESS  Uneventful stay over night moving all 4 ext.  No tongue deviation, and smile was symmetric. Incision healing well.  Stable for discharge home.   Significant Diagnostic Studies: CBC Lab Results  Component Value Date   WBC 3.8 (L) 06/23/2020   HGB 9.4 (L) 06/23/2020   HCT 29.8 (L) 06/23/2020   MCV 98.3 06/23/2020   PLT 149 (L) 06/23/2020    BMET    Component Value Date/Time   NA 136 06/23/2020 0615   NA 140 10/31/2011 1039   K 3.3 (L) 06/23/2020 0615   K 4.4 10/31/2011 1039   CL 102 06/23/2020 0615   CL 100 10/31/2011 1039   CO2 26 06/23/2020 0615   CO2 30 10/31/2011 1039   GLUCOSE 118 (H) 06/23/2020 0615   GLUCOSE 97 10/31/2011 1039   BUN 11 06/23/2020 0615   BUN 18 10/31/2011 1039   CREATININE 1.23 06/23/2020 0615   CREATININE 1.20 09/20/2019 1116   CALCIUM 9.2 06/23/2020 0615   CALCIUM 9.4 10/31/2011 1039   GFRNONAA 59 (L) 06/23/2020 0615   GFRNONAA 43 (L) 09/04/2018 1154   GFRAA >60 06/23/2020 0615   GFRAA 50 (L) 09/04/2018 1154   COAG Lab Results  Component Value Date   INR 1.2 06/17/2020   INR 1.1 05/13/2020   INR 1.08 07/07/2010     Disposition:  Discharge to :Home Discharge Instructions    Call MD for:  redness, tenderness, or signs of infection (pain, swelling, bleeding, redness, odor or green/yellow discharge around incision site)   Complete by: As directed    Call MD for:  severe or increased  pain, loss or decreased feeling  in affected limb(s)   Complete by: As directed    Call MD for:  temperature >100.5   Complete by: As directed    Discharge instructions   Complete by: As directed    You may shower daily, gradually increase activity as tolerates.   Resume previous diet   Complete by: As directed      Allergies as of 06/23/2020      Reactions   Codeine Other (See Comments)   "makes me crazy"   Sulfamethoxazole-trimethoprim Itching, Other (See Comments)   "makes me crazy"   Sulfonamide Derivatives Itching, Other (See Comments)   "makes me crazy"      Medication List    TAKE  these medications   aspirin 81 MG EC tablet Take 1 tablet (81 mg total) by mouth daily.   benazepril 10 MG tablet Commonly known as: LOTENSIN Take 10 mg by mouth daily.   blood glucose meter kit and supplies Dispense based on patient and insurance preference. Use up to four times daily as directed. (FOR ICD-10 E10.9, E11.9).   clonazePAM 1 MG tablet Commonly known as: KLONOPIN TAKE 1 TABLET 3 TIMES A DAY AS NEEDED FOR ANXIETY What changed: See the new instructions.   clopidogrel 75 MG tablet Commonly known as: PLAVIX Take 1 tablet (75 mg total) by mouth daily.   Contour Next Test test strip Generic drug: glucose blood   diphenhydrAMINE 25 MG tablet Commonly known as: BENADRYL Take 50 mg by mouth at bedtime as needed.   fenofibrate 145 MG tablet Commonly known as: TRICOR TAKE ONE TABLET BY MOUTH DAILY   FLUoxetine 20 MG capsule Commonly known as: PROZAC TAKE ONE CAPSULE BY MOUTH DAILY   folic acid 1 MG tablet Commonly known as: FOLVITE Take 2 tablets (2 mg total) by mouth daily.   HumaLOG KwikPen 100 UNIT/ML KwikPen Generic drug: insulin lispro Inject 1-15 Units into the skin 3 (three) times daily before meals. Per sliding scale   HYDROcodone-acetaminophen 5-325 MG tablet Commonly known as: Norco Take 1 tablet by mouth every 6 (six) hours as needed for moderate pain.   insulin glargine 100 UNIT/ML Solostar Pen Commonly known as: LANTUS Inject 26 Units into the skin daily at 10 pm.   Insulin Pen Needle 32G X 8 MM Misc Use as directed   levothyroxine 50 MCG tablet Commonly known as: SYNTHROID Take 1 tablet (50 mcg total) by mouth daily before breakfast.   metoCLOPramide 5 MG tablet Commonly known as: REGLAN Take 5 mg by mouth at bedtime.   metoprolol succinate 25 MG 24 hr tablet Commonly known as: TOPROL-XL Take 1 tablet (25 mg total) by mouth daily. What changed: how much to take   pantoprazole 40 MG tablet Commonly known as: PROTONIX Take 40 mg by  mouth daily.   rosuvastatin 20 MG tablet Commonly known as: CRESTOR Take 1 tablet (20 mg total) by mouth daily.   temazepam 30 MG capsule Commonly known as: RESTORIL Take 30 mg by mouth at bedtime.   Tivicay 50 MG tablet Generic drug: dolutegravir Take 50 mg by mouth daily.   Triumeq 600-50-300 MG tablet Generic drug: abacavir-dolutegravir-lamiVUDine TAKE ONE TABLET BY MOUTH DAILY      Verbal and written Discharge instructions given to the patient. Wound care per Discharge AVS  Follow-up Information    Elam Dutch, MD.   Specialties: Vascular Surgery, Cardiology Why: Office will call you to arrange your appt (sent) Contact information: [address]  [place] [address] [phone]               Signed: Roxy Horseman 06/30/2020, 6:46 AM  --- For VQI Registry use --- Instructions: Press F2 to tab through selections.  Delete question if not applicable.   Modified Rankin score at D/C (0-6): Rankin Score=0  IV medication needed for:  1. Hypertension: No 2. Hypotension: No  Post-op Complications: No  1. Post-op CVA or TIA: No  If yes: Event classification (right eye, left eye, right cortical, left cortical, verterobasilar, other):   If yes: Timing of event (intra-op, <6 hrs post-op, >=6 hrs post-op, unknown):   2. CN injury: No  If yes: CN  injuried   3. Myocardial infarction: No  If yes: Dx by (EKG or clinical, Troponin):   4.  CHF: No  5.  Dysrhythmia (new): No  6. Wound infection: No  7. Reperfusion symptoms: No  8. Return to OR: No  If yes: return to OR for (bleeding, neurologic, other CEA incision, other):   Discharge medications: Statin use:  Yes ASA use:  Yes Beta blocker use:  No  for medical reason not indicated ACE-Inhibitor use:  Yes P2Y12 Antagonist use: _0  None, [x ] Plavix, _1  Plasugrel, _2  Ticlopinine, _3  Ticagrelor, _4  Other, _5  No for medical reason, _6  Non-compliant, _7  Not-indicated Anti-coagulant  use:  [ x] None, _8  Warfarin, _9  Rivaroxaban, _10  Dabigatran, _11  Other, _12  No for medical reason, _13  Non-compliant, _14  Not-indicated

## 2020-07-01 ENCOUNTER — Other Ambulatory Visit: Payer: Self-pay

## 2020-07-01 DIAGNOSIS — I6523 Occlusion and stenosis of bilateral carotid arteries: Secondary | ICD-10-CM

## 2020-07-04 ENCOUNTER — Other Ambulatory Visit: Payer: Self-pay | Admitting: Internal Medicine

## 2020-07-04 DIAGNOSIS — G629 Polyneuropathy, unspecified: Secondary | ICD-10-CM

## 2020-07-15 ENCOUNTER — Encounter: Payer: Self-pay | Admitting: Gastroenterology

## 2020-07-15 ENCOUNTER — Ambulatory Visit (INDEPENDENT_AMBULATORY_CARE_PROVIDER_SITE_OTHER): Payer: PRIVATE HEALTH INSURANCE | Admitting: Gastroenterology

## 2020-07-15 VITALS — BP 80/60 | HR 80 | Ht 70.0 in | Wt 139.6 lb

## 2020-07-15 DIAGNOSIS — B370 Candidal stomatitis: Secondary | ICD-10-CM | POA: Diagnosis not present

## 2020-07-15 DIAGNOSIS — R1310 Dysphagia, unspecified: Secondary | ICD-10-CM | POA: Diagnosis not present

## 2020-07-15 MED ORDER — FLUCONAZOLE 100 MG PO TABS
ORAL_TABLET | ORAL | 0 refills | Status: DC
Start: 2020-07-15 — End: 2020-11-17

## 2020-07-15 NOTE — Progress Notes (Signed)
07/15/2020 Christopher [name] [id number] 08-03-1951   HISTORY OF PRESENT ILLNESS: This is a 69 year old male who is known to Dr. Loletha Carrow for following of his complaints of chronic dysphagia related to esophageal stricture as well as issues with gastroparesis and chronic idiopathic constipation.  He was last seen here in March 2020.  He is here today with complaints of dysphagia and sore throat.  He had cardiac carotid stents placed in June and then earlier this month.  Is on Plavix.  Says that the sore throat on the left started a few days after his left-sided carotid stent placement.  He has an appointment for follow-up with the vascular surgeons tomorrow.  He admits to trouble swallowing again for some time.  His last EGD was in 2018.  He takes his pantoprazole 40 mg daily.  He tells me that he is able to swallow liquids and soft foods and some other stuff as well as long as he chews very well.   Past Medical History:  Diagnosis Date  . Anxiety   . Cataract   . Chronic kidney disease   . 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   . Diabetes mellitus without complication ([place])   . GERD (gastroesophageal reflux disease)   . Heart murmur    CHILDHOOD  . History of kidney stones   . HIV DISEASE 12/01/2006  . HYPERLIPIDEMIA, WITH LOW HDL 01/03/2008  . Hypertension   . HYPERTENSION 12/01/2006  . HYPOGONADISM 12/23/2009  . Hypothyroidism   . Neuromuscular disorder ([place])   . SYPHILIS, [place], LATENT NOS 01/18/2007  . Thyroid disease   . 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 Tuberculosis    history of TB about 30 years ago   Past Surgical History:  Procedure Laterality Date  . COLONOSCOPY    . CORONARY ANGIOGRAPHY N/A 05/05/2020   Procedure: CORONARY ANGIOGRAPHY;  Surgeon: Nigel Mormon, MD;   Location: [facility];  Service: Cardiovascular;  Laterality: N/A;  . TONSILECTOMY, ADENOIDECTOMY, BILATERAL MYRINGOTOMY AND TUBES     tonsil cancer   . TONSILLECTOMY    . TRANSCAROTID ARTERY REVASCULARIZATION Right 05/18/2020   Procedure: RIGHT TRANSCAROTID ARTERY REVASCULARIZATION;  Surgeon: Elam Dutch, MD;  Location: [facility];  Service: Vascular;  Laterality: Right;  . TRANSCAROTID ARTERY REVASCULARIZATION Left 06/22/2020   Procedure: LEFT TRANSCAROTID ARTERY REVASCULARIZATION;  Surgeon: Elam Dutch, MD;  Location: [facility];  Service: Vascular;  Laterality: Left;  . ULTRASOUND GUIDANCE FOR VASCULAR ACCESS Right 06/22/2020   Procedure: ULTRASOUND GUIDANCE FOR VASCULAR ACCESS;  Surgeon: Elam Dutch, MD;  Location: [facility];  Service: Vascular;  Laterality: Right;  . UPPER GASTROINTESTINAL ENDOSCOPY      reports that he has quit smoking. He has never used smokeless tobacco. He reports current alcohol use of about 2.0 standard drinks of alcohol per week. He reports that he does not use drugs. family history includes COPD in his mother; Diabetes in his mother; Heart attack in his father. Allergies  Allergen Reactions  . Codeine Other (See Comments)    "makes me crazy"  . Sulfamethoxazole-Trimethoprim Itching and Other (See Comments)    "makes me crazy"  . Sulfonamide Derivatives Itching and Other (See Comments)    "makes me crazy"  Outpatient Encounter Medications as of 07/15/2020  Medication Sig  . aspirin EC 81 MG EC tablet Take 1 tablet (81 mg total) by mouth daily.  . benazepril (LOTENSIN) 10 MG tablet Take 10 mg by mouth daily.   . blood glucose meter kit and supplies Dispense based on patient and insurance preference. Use up to four times daily as directed. (FOR ICD-10 E10.9, E11.9).  . clonazePAM (KLONOPIN) 1 MG tablet TAKE 1 TABLET 3 TIMES A DAY AS NEEDED FOR ANXIETY (Patient taking differently: Take 1 mg by mouth 3 (three) times daily as needed for anxiety. )    . clopidogrel (PLAVIX) 75 MG tablet Take 1 tablet (75 mg total) by mouth daily.  . CONTOUR NEXT TEST test strip   . diphenhydrAMINE (BENADRYL) 25 MG tablet Take 50 mg by mouth at bedtime as needed.   . dolutegravir (TIVICAY) 50 MG tablet Take 50 mg by mouth daily.   . fenofibrate (TRICOR) 145 MG tablet TAKE ONE TABLET BY MOUTH DAILY (Patient taking differently: Take 145 mg by mouth daily. )  . FLUoxetine (PROZAC) 20 MG capsule TAKE ONE CAPSULE BY MOUTH DAILY  . folic acid (FOLVITE) 1 MG tablet Take 2 tablets (2 mg total) by mouth daily.  Marland Kitchen HUMALOG KWIKPEN 100 UNIT/ML KwikPen Inject 1-15 Units into the skin 3 (three) times daily before meals. Per sliding scale  . HYDROcodone-acetaminophen (NORCO) 5-325 MG tablet Take 1 tablet by mouth every 6 (six) hours as needed for moderate pain.  Marland Kitchen insulin glargine (LANTUS) 100 UNIT/ML Solostar Pen Inject 26 Units into the skin daily at 10 pm.  . Insulin Pen Needle 32G X 8 MM MISC Use as directed  . levothyroxine (SYNTHROID, LEVOTHROID) 50 MCG tablet Take 1 tablet (50 mcg total) by mouth daily before breakfast.  . metoCLOPramide (REGLAN) 5 MG tablet Take 5 mg by mouth at bedtime.  . metoprolol succinate (TOPROL-XL) 25 MG 24 hr tablet Take 1 tablet (25 mg total) by mouth daily. (Patient taking differently: Take 12.5 mg by mouth daily. )  . pantoprazole (PROTONIX) 40 MG tablet Take 40 mg by mouth daily.  . rosuvastatin (CRESTOR) 20 MG tablet Take 1 tablet (20 mg total) by mouth daily.  . temazepam (RESTORIL) 30 MG capsule Take 30 mg by mouth at bedtime.  . TRIUMEQ 600-50-300 MG tablet TAKE ONE TABLET BY MOUTH DAILY (Patient taking differently: Take 1 tablet by mouth daily. )  . [DISCONTINUED] Dolutegravir Sodium (TIVICAY) 50 MG TABS Take 1 tablet (50 mg total) by mouth daily.   No facility-administered encounter medications on file as of 07/15/2020.     REVIEW OF SYSTEMS  : All other systems reviewed and negative except where noted in the History of Present  Illness.   PHYSICAL EXAM: BP (!) 80/60   Pulse 80   Ht 5' 10"  (1.778 m)   Wt 139 lb 9.6 oz (63.3 kg)   BMI 20.03 kg/m  General: Thin white male in no acute distress Head: Normocephalic and atraumatic Eyes:  sclerae anicteric,conjunctive pink. Ears: Normal auditory acuity Mouth:  Poor dentition.  White coating noted on tongue and buccal mucosa. Lungs: Clear throughout to auscultation; no W/R/R. Heart: Regular rate and rhythm; no M/R/G. Abdomen: Soft, non-distended.  BS present.  Non-tender. Musculoskeletal: Symmetrical with no gross deformities  Skin: No lesions on visible extremities Extremities: No edema  Neurological: Alert oriented x 4, grossly non-focal Psychological:  Alert and cooperative. Normal mood and affect  ASSESSMENT AND PLAN: *Dsyphagia:  Has history of esophageal  stricture with dilation.  He just had both carotid stents placed in June and earlier this month and is on Plavix.  Unfortunately I do not think we are able to safely discontinue the Plavix at this time for repeat EGD.  He is tolerating full liquids and soft foods as well and other things if he just chews very well.  We will order an esophagram for evaluation for now. *Thrush: Has extensive white coating on his tongue and his buccal mucosa.  May be contributing to some of his complaints of sore throat but he will also let his vascular surgeon know about the sore throat tomorrow since it started after one of his carotid stent placements.  We will treat with fluconazole 200 mg the 1st day and then 100 mg for 13 days following.  Prescription sent to the pharmacy.  **At whatever point we discuss repeating an endoscopy, whether it be in 6 months or a year, whenever it is safe to hold his Plavix, should consider colonoscopy at that time as well.   CC:  [name], MD

## 2020-07-15 NOTE — Patient Instructions (Signed)
If you are age 70 or older, your body mass index should be between 23-30. Your Body mass index is 20.03 kg/m. If this is out of the aforementioned range listed, please consider follow up with your Primary Care Provider.  If you are age 63 or younger, your body mass index should be between 19-25. Your Body mass index is 20.03 kg/m. If this is out of the aformentioned range listed, please consider follow up with your Primary Care Provider.   We have sent the following medications to your pharmacy for you to pick up at your convenience: Fluconazole 200 mg on day one, then 100 mg daily for 13 days.  You have been scheduled for a Barium Esophogram at [facility] (1st floor of the hospital) on Thursday 07/23/20 at 10:30 am. Please arrive 15 minutes prior to your appointment for registration. Make certain not to have anything to eat or drink 3 hours prior to your test. If you need to reschedule for any reason, please contact radiology at [phone] to do so. __________________________________________________________________ A barium swallow is an examination that concentrates on views of the esophagus. This tends to be a double contrast exam (barium and two liquids which, when combined, create a gas to distend the wall of the oesophagus) or single contrast (non-ionic iodine based). The study is usually tailored to your symptoms so a good history is essential. Attention is paid during the study to the form, structure and configuration of the esophagus, looking for functional disorders (such as aspiration, dysphagia, achalasia, motility and reflux) EXAMINATION You may be asked to change into a gown, depending on the type of swallow being performed. A radiologist and radiographer will perform the procedure. The radiologist will advise you of the type of contrast selected for your procedure and direct you during the exam. You will be asked to stand, sit or lie in several different positions and to  hold a small amount of fluid in your mouth before being asked to swallow while the imaging is performed .In some instances you may be asked to swallow barium coated marshmallows to assess the motility of a solid food bolus. The exam can be recorded as a digital or video fluoroscopy procedure. POST PROCEDURE It will take 1-2 days for the barium to pass through your system. To facilitate this, it is important, unless otherwise directed, to increase your fluids for the next 24-48hrs and to resume your normal diet.  This test typically takes about 30 minutes to perform. ________________________________________________________________

## 2020-07-16 ENCOUNTER — Other Ambulatory Visit: Payer: Self-pay

## 2020-07-16 ENCOUNTER — Ambulatory Visit (HOSPITAL_COMMUNITY)
Admission: RE | Admit: 2020-07-16 | Discharge: 2020-07-16 | Disposition: A | Discharge status: Home / Self Care | Payer: PRIVATE HEALTH INSURANCE | Source: Ambulatory Visit | Attending: Vascular Surgery | Admitting: Vascular Surgery

## 2020-07-16 ENCOUNTER — Ambulatory Visit (INDEPENDENT_AMBULATORY_CARE_PROVIDER_SITE_OTHER): Payer: PRIVATE HEALTH INSURANCE | Admitting: Vascular Surgery

## 2020-07-16 ENCOUNTER — Encounter: Payer: Self-pay | Admitting: Vascular Surgery

## 2020-07-16 VITALS — BP 99/65 | HR 65 | Temp 97.9°F | Resp 20 | Ht 70.0 in | Wt 140.0 lb

## 2020-07-16 DIAGNOSIS — I6523 Occlusion and stenosis of bilateral carotid arteries: Secondary | ICD-10-CM | POA: Diagnosis present

## 2020-07-16 NOTE — Progress Notes (Signed)
Patient is a 69 year old male who returns for follow-up today.  He underwent right TCAR stenting for radiation injury carotid stenosis May 18, 2020 followed by interval left TCAR stenting June 22, 2020.  He states he still has some soreness in his throat and may be a little bit of voice change.  However, he is also currently being treated for thrush.  He has no symptoms of TIA amaurosis or stroke.  He continues to take his Plavix and aspirin.  Physical exam:  Vitals:   07/16/20 1021 07/16/20 1023  BP: 98/64 99/65  Pulse: 65   Resp: 20   Temp: 97.9 F (36.6 C)   SpO2: 94%   Weight: 140 lb (63.5 kg)   Height: 5\' 10"  (1.778 m)     Neck: Well-healed bilateral neck incisions  Neuro: Symmetric upper extremity lower extremity motor strength  Data: Patient had a carotid duplex exam today shows wide patency of the left carotid stent  Assessment: Doing well status post bilateral carotid stenting.  Still some voice changes and soreness possibly due to his ongoing thrush.  This is currently being treated.  Patient may also have some element of neuropraxia.  Plan: Patient will follow up with me in 6 to 8 weeks to see if his voice quality has returned as well as his swallowing to baseline.  If not we will consider ENT evaluation.  Otherwise needs follow-up carotid duplex scan in 6 months time.  He will be on Plavix and aspirin and statin for life.  Ruta Hinds, MD [facility] [place] Office: [phone]

## 2020-07-19 NOTE — Progress Notes (Signed)
____________________________________________________________  Attending physician addendum:  Thank you for sending this case to me. I have reviewed the entire note, and the outlined plan seems appropriate.  I know this patient well and suspect he has recurrent stricture.  As you note, he may need to remain on a soft or puree diet until he can briefly be off plavix for EGD/dilation.  Candida certainly could be exacerbating it as well.  Wilfrid Lund, MD  ____________________________________________________________

## 2020-07-23 ENCOUNTER — Inpatient Hospital Stay (HOSPITAL_COMMUNITY): Admission: RE | Admit: 2020-07-23 | Payer: PRIVATE HEALTH INSURANCE | Source: Ambulatory Visit

## 2020-09-17 ENCOUNTER — Other Ambulatory Visit: Payer: Self-pay

## 2020-09-17 ENCOUNTER — Ambulatory Visit (INDEPENDENT_AMBULATORY_CARE_PROVIDER_SITE_OTHER): Payer: Self-pay | Admitting: Vascular Surgery

## 2020-09-17 ENCOUNTER — Encounter: Payer: Self-pay | Admitting: Vascular Surgery

## 2020-09-17 VITALS — BP 148/87 | HR 77 | Temp 98.5°F | Resp 20 | Ht 70.0 in | Wt 141.0 lb

## 2020-09-17 DIAGNOSIS — I6523 Occlusion and stenosis of bilateral carotid arteries: Secondary | ICD-10-CM

## 2020-09-17 NOTE — Progress Notes (Signed)
Patient is a 69 year old male who returns for follow-up today.  Previously underwent right TCAR stenting for radiation injury carotid stenosis May 18, 2020.  He had interval left TCAR stenting August 2021.  Postoperatively he had thrush and also some voice changes.  He has no symptoms of TIA amaurosis or stroke.  He is on Plavix aspirin and statin.  His swallowing and voice have returned to normal at this point.  He states he is going to have full mouth extractions in the near future and asked about his Plavix and aspirin.  I did discuss with him that he would need to be on Plavix and aspirin for life but after 6 weeks we could briefly stop his Plavix and aspirin if we needed to for a dental procedure.  Physical exam:  Vitals:   09/17/20 1121 09/17/20 1123  BP: (!) 144/82 (!) 148/87  Pulse: 77   Resp: 20   Temp: 98.5 F (36.9 C)   SpO2: 95%   Weight: 141 lb (64 kg)   Height: 5\' 10"  (1.778 m)     Neck: Well-healed incisions no erythema  Neuro: Symmetric upper extremity lower extremity motor strength 5/5 no facial asymmetry has a slightly muffled voice which is at baseline  Assessment: Doing well status post bilateral TCAR carotid stenting.  Plan: Patient will follow up in April 2022 in our APP clinic with bilateral carotid duplex exam.  He will continue his Plavix and aspirin for life.  Ruta Hinds, MD [facility] [place] Office: [phone]

## 2020-11-16 ENCOUNTER — Telehealth: Payer: Self-pay | Admitting: Gastroenterology

## 2020-11-16 NOTE — Telephone Encounter (Signed)
Pt's spouse Fayrene Fearing called stating that pt has been having issues swallowing and it has gotten worse since three days ago. He is requesting for pt to be seen today. Pls call.

## 2020-11-16 NOTE — Telephone Encounter (Signed)
The pt is having increasing difficulty with swallowing and weight loss.  He has been scheduled to see Colleen tomorrow at 2 pm to discuss.  The pt son is concerned that the pt is loosing weight rapidly.  Swallowing is becoming more difficult.  He has been advised to remain on liquids until tomorrow.  The pt has been advised of the information and verbalized understanding.

## 2020-11-17 ENCOUNTER — Other Ambulatory Visit: Payer: Self-pay

## 2020-11-17 ENCOUNTER — Ambulatory Visit (INDEPENDENT_AMBULATORY_CARE_PROVIDER_SITE_OTHER): Payer: PRIVATE HEALTH INSURANCE | Admitting: Nurse Practitioner

## 2020-11-17 ENCOUNTER — Encounter (HOSPITAL_COMMUNITY): Payer: Self-pay

## 2020-11-17 ENCOUNTER — Inpatient Hospital Stay (HOSPITAL_COMMUNITY)
Admission: EM | Admit: 2020-11-17 | Discharge: 2020-11-27 | DRG: 391 | Disposition: A | Discharge status: Home / Self Care | Payer: No Typology Code available for payment source | Attending: Internal Medicine | Admitting: Internal Medicine

## 2020-11-17 ENCOUNTER — Emergency Department (HOSPITAL_COMMUNITY): Payer: No Typology Code available for payment source

## 2020-11-17 ENCOUNTER — Encounter: Payer: Self-pay | Admitting: Nurse Practitioner

## 2020-11-17 VITALS — BP 116/64 | HR 112 | Ht 68.5 in | Wt 126.4 lb

## 2020-11-17 DIAGNOSIS — K089 Disorder of teeth and supporting structures, unspecified: Secondary | ICD-10-CM

## 2020-11-17 DIAGNOSIS — K9281 Gastrointestinal mucositis (ulcerative): Secondary | ICD-10-CM

## 2020-11-17 DIAGNOSIS — Z66 Do not resuscitate: Secondary | ICD-10-CM | POA: Diagnosis present

## 2020-11-17 DIAGNOSIS — R1310 Dysphagia, unspecified: Secondary | ICD-10-CM

## 2020-11-17 DIAGNOSIS — Z882 Allergy status to sulfonamides status: Secondary | ICD-10-CM

## 2020-11-17 DIAGNOSIS — Z833 Family history of diabetes mellitus: Secondary | ICD-10-CM

## 2020-11-17 DIAGNOSIS — E876 Hypokalemia: Secondary | ICD-10-CM | POA: Diagnosis not present

## 2020-11-17 DIAGNOSIS — F419 Anxiety disorder, unspecified: Secondary | ICD-10-CM | POA: Diagnosis present

## 2020-11-17 DIAGNOSIS — Z85819 Personal history of malignant neoplasm of unspecified site of lip, oral cavity, and pharynx: Secondary | ICD-10-CM | POA: Diagnosis not present

## 2020-11-17 DIAGNOSIS — E1169 Type 2 diabetes mellitus with other specified complication: Secondary | ICD-10-CM | POA: Diagnosis present

## 2020-11-17 DIAGNOSIS — Z955 Presence of coronary angioplasty implant and graft: Secondary | ICD-10-CM

## 2020-11-17 DIAGNOSIS — B20 Human immunodeficiency virus [HIV] disease: Secondary | ICD-10-CM | POA: Diagnosis present

## 2020-11-17 DIAGNOSIS — E785 Hyperlipidemia, unspecified: Secondary | ICD-10-CM | POA: Diagnosis present

## 2020-11-17 DIAGNOSIS — Z79899 Other long term (current) drug therapy: Secondary | ICD-10-CM

## 2020-11-17 DIAGNOSIS — N1831 Chronic kidney disease, stage 3a: Secondary | ICD-10-CM | POA: Diagnosis present

## 2020-11-17 DIAGNOSIS — K1230 Oral mucositis (ulcerative), unspecified: Secondary | ICD-10-CM | POA: Diagnosis present

## 2020-11-17 DIAGNOSIS — Z85818 Personal history of malignant neoplasm of other sites of lip, oral cavity, and pharynx: Secondary | ICD-10-CM | POA: Diagnosis not present

## 2020-11-17 DIAGNOSIS — Z7982 Long term (current) use of aspirin: Secondary | ICD-10-CM

## 2020-11-17 DIAGNOSIS — Z79890 Hormone replacement therapy (postmenopausal): Secondary | ICD-10-CM

## 2020-11-17 DIAGNOSIS — E119 Type 2 diabetes mellitus without complications: Secondary | ICD-10-CM

## 2020-11-17 DIAGNOSIS — Z20822 Contact with and (suspected) exposure to covid-19: Secondary | ICD-10-CM | POA: Diagnosis present

## 2020-11-17 DIAGNOSIS — Z923 Personal history of irradiation: Secondary | ICD-10-CM | POA: Diagnosis not present

## 2020-11-17 DIAGNOSIS — K920 Hematemesis: Secondary | ICD-10-CM | POA: Diagnosis present

## 2020-11-17 DIAGNOSIS — E1159 Type 2 diabetes mellitus with other circulatory complications: Secondary | ICD-10-CM | POA: Diagnosis present

## 2020-11-17 DIAGNOSIS — Z87891 Personal history of nicotine dependence: Secondary | ICD-10-CM

## 2020-11-17 DIAGNOSIS — Z794 Long term (current) use of insulin: Secondary | ICD-10-CM

## 2020-11-17 DIAGNOSIS — Z9221 Personal history of antineoplastic chemotherapy: Secondary | ICD-10-CM

## 2020-11-17 DIAGNOSIS — I152 Hypertension secondary to endocrine disorders: Secondary | ICD-10-CM | POA: Diagnosis present

## 2020-11-17 DIAGNOSIS — E1122 Type 2 diabetes mellitus with diabetic chronic kidney disease: Secondary | ICD-10-CM | POA: Diagnosis present

## 2020-11-17 DIAGNOSIS — F32A Depression, unspecified: Secondary | ICD-10-CM | POA: Diagnosis present

## 2020-11-17 DIAGNOSIS — Z8611 Personal history of tuberculosis: Secondary | ICD-10-CM

## 2020-11-17 DIAGNOSIS — Z888 Allergy status to other drugs, medicaments and biological substances status: Secondary | ICD-10-CM | POA: Diagnosis not present

## 2020-11-17 DIAGNOSIS — Z931 Gastrostomy status: Secondary | ICD-10-CM

## 2020-11-17 DIAGNOSIS — K222 Esophageal obstruction: Principal | ICD-10-CM | POA: Diagnosis present

## 2020-11-17 DIAGNOSIS — I2510 Atherosclerotic heart disease of native coronary artery without angina pectoris: Secondary | ICD-10-CM | POA: Diagnosis present

## 2020-11-17 DIAGNOSIS — E43 Unspecified severe protein-calorie malnutrition: Secondary | ICD-10-CM | POA: Insufficient documentation

## 2020-11-17 DIAGNOSIS — Z681 Body mass index (BMI) 19 or less, adult: Secondary | ICD-10-CM

## 2020-11-17 DIAGNOSIS — E11649 Type 2 diabetes mellitus with hypoglycemia without coma: Secondary | ICD-10-CM | POA: Diagnosis present

## 2020-11-17 DIAGNOSIS — Z7902 Long term (current) use of antithrombotics/antiplatelets: Secondary | ICD-10-CM

## 2020-11-17 DIAGNOSIS — N189 Chronic kidney disease, unspecified: Secondary | ICD-10-CM | POA: Diagnosis not present

## 2020-11-17 DIAGNOSIS — K219 Gastro-esophageal reflux disease without esophagitis: Secondary | ICD-10-CM | POA: Diagnosis present

## 2020-11-17 DIAGNOSIS — E860 Dehydration: Secondary | ICD-10-CM | POA: Diagnosis present

## 2020-11-17 DIAGNOSIS — R042 Hemoptysis: Secondary | ICD-10-CM | POA: Diagnosis present

## 2020-11-17 DIAGNOSIS — D631 Anemia in chronic kidney disease: Secondary | ICD-10-CM | POA: Diagnosis present

## 2020-11-17 DIAGNOSIS — E039 Hypothyroidism, unspecified: Secondary | ICD-10-CM | POA: Diagnosis present

## 2020-11-17 DIAGNOSIS — Z885 Allergy status to narcotic agent status: Secondary | ICD-10-CM | POA: Diagnosis not present

## 2020-11-17 DIAGNOSIS — N179 Acute kidney failure, unspecified: Secondary | ICD-10-CM | POA: Diagnosis present

## 2020-11-17 DIAGNOSIS — Z8249 Family history of ischemic heart disease and other diseases of the circulatory system: Secondary | ICD-10-CM

## 2020-11-17 LAB — RESP PANEL BY RT-PCR (FLU A&B, COVID) ARPGX2
Influenza A by PCR: NEGATIVE
Influenza B by PCR: NEGATIVE
SARS Coronavirus 2 by RT PCR: NEGATIVE

## 2020-11-17 LAB — COMPREHENSIVE METABOLIC PANEL
ALT: 32 U/L (ref 0–44)
AST: 45 U/L — ABNORMAL HIGH (ref 15–41)
Albumin: 4.3 g/dL (ref 3.5–5.0)
Alkaline Phosphatase: 49 U/L (ref 38–126)
Anion gap: 14 (ref 5–15)
BUN: 48 mg/dL — ABNORMAL HIGH (ref 8–23)
CO2: 25 mmol/L (ref 22–32)
Calcium: 10.3 mg/dL (ref 8.9–10.3)
Chloride: 100 mmol/L (ref 98–111)
Creatinine, Ser: 1.77 mg/dL — ABNORMAL HIGH (ref 0.61–1.24)
GFR, Estimated: 41 mL/min — ABNORMAL LOW (ref >60)
Glucose, Bld: 105 mg/dL — ABNORMAL HIGH (ref 70–99)
Potassium: 4.1 mmol/L (ref 3.5–5.1)
Sodium: 139 mmol/L (ref 135–145)
Total Bilirubin: 1.4 mg/dL — ABNORMAL HIGH (ref 0.3–1.2)
Total Protein: 8.8 g/dL — ABNORMAL HIGH (ref 6.5–8.1)

## 2020-11-17 LAB — LACTIC ACID, PLASMA
Lactic Acid, Venous: 0.8 mmol/L (ref 0.5–1.9)
Lactic Acid, Venous: 0.5 mmol/L (ref 0.5–1.9)

## 2020-11-17 LAB — URINALYSIS, ROUTINE W REFLEX MICROSCOPIC
Bacteria, UA: NONE SEEN
Bilirubin Urine: NEGATIVE
Glucose, UA: NEGATIVE mg/dL
Ketones, ur: 20 mg/dL — AB
Leukocytes,Ua: NEGATIVE
Nitrite: NEGATIVE
Protein, ur: 30 mg/dL — AB
Specific Gravity, Urine: 1.017 (ref 1.005–1.030)
pH: 5 (ref 5.0–8.0)

## 2020-11-17 LAB — CBC WITH DIFFERENTIAL/PLATELET
Abs Immature Granulocytes: 0.03 10*3/uL (ref 0.00–0.07)
Basophils Absolute: 0 10*3/uL (ref 0.0–0.1)
Basophils Relative: 1 %
Eosinophils Absolute: 0.1 10*3/uL (ref 0.0–0.5)
Eosinophils Relative: 2 %
HCT: 38.3 % — ABNORMAL LOW (ref 39.0–52.0)
Hemoglobin: 12 g/dL — ABNORMAL LOW (ref 13.0–17.0)
Immature Granulocytes: 1 %
Lymphocytes Relative: 15 %
Lymphs Abs: 0.9 10*3/uL (ref 0.7–4.0)
MCH: 31.6 pg (ref 26.0–34.0)
MCHC: 31.3 g/dL (ref 30.0–36.0)
MCV: 100.8 fL — ABNORMAL HIGH (ref 80.0–100.0)
Monocytes Absolute: 0.3 10*3/uL (ref 0.1–1.0)
Monocytes Relative: 6 %
Neutro Abs: 4.5 10*3/uL (ref 1.7–7.7)
Neutrophils Relative %: 75 %
Platelets: 265 10*3/uL (ref 150–400)
RBC: 3.8 MIL/uL — ABNORMAL LOW (ref 4.22–5.81)
RDW: 14.8 % (ref 11.5–15.5)
WBC: 5.9 10*3/uL (ref 4.0–10.5)
nRBC: 0 % (ref 0.0–0.2)

## 2020-11-17 LAB — CBG MONITORING, ED: Glucose-Capillary: 118 mg/dL — ABNORMAL HIGH (ref 70–99)

## 2020-11-17 MED ORDER — MORPHINE SULFATE (PF) 2 MG/ML IV SOLN
1.0000 mg | INTRAVENOUS | Status: DC | PRN
  Administered 2020-11-18 – 2020-11-24 (×7): 1 mg via INTRAVENOUS
  Filled 2020-11-17 (×7): qty 1

## 2020-11-17 MED ORDER — ONDANSETRON HCL 4 MG/2ML IJ SOLN: 4.0000 mg | Freq: Four times a day (QID) | INTRAMUSCULAR | Status: DC | PRN

## 2020-11-17 MED ORDER — LACTATED RINGERS IV SOLN: INTRAVENOUS | Status: AC

## 2020-11-17 MED ORDER — ONDANSETRON HCL 4 MG PO TABS: 4.0000 mg | ORAL_TABLET | Freq: Four times a day (QID) | ORAL | Status: DC | PRN

## 2020-11-17 MED ORDER — INSULIN ASPART 100 UNIT/ML ~~LOC~~ SOLN
0.0000 [IU] | SUBCUTANEOUS | Status: DC
  Administered 2020-11-20: 1 [IU] via SUBCUTANEOUS
  Administered 2020-11-20: 3 [IU] via SUBCUTANEOUS
  Administered 2020-11-20 – 2020-11-21 (×2): 1 [IU] via SUBCUTANEOUS
  Administered 2020-11-21: 2 [IU] via SUBCUTANEOUS
  Administered 2020-11-21 – 2020-11-27 (×12): 1 [IU] via SUBCUTANEOUS
  Filled 2020-11-17: qty 0.09

## 2020-11-17 MED ORDER — SODIUM CHLORIDE 0.9 % IV BOLUS
1000.0000 mL | Freq: Once | INTRAVENOUS | Status: AC
  Administered 2020-11-17: 19:00:00 1000 mL via INTRAVENOUS

## 2020-11-17 MED ORDER — IOHEXOL 300 MG/ML  SOLN
75.0000 mL | Freq: Once | INTRAMUSCULAR | Status: AC | PRN
  Administered 2020-11-17: 20:00:00 75 mL via INTRAVENOUS

## 2020-11-17 NOTE — Patient Instructions (Addendum)
If you are age 70 or older, your body mass index should be between 23-30. Your Body mass index is 18.94 kg/m. If this is out of the aforementioned range listed, please consider follow up with your Primary Care Provider.  If you are age 61 or younger, your body mass index should be between 19-25. Your Body mass index is 18.94 kg/m. If this is out of the aformentioned range listed, please consider follow up with your Primary Care Provider.     Please proceed directly to [facility] for ENT evaluation as your exam today is concerning for an oral abscess, oral candidiasis and posterior pharynx hematoma.    We have notified them you are on your way.   It was great seeing you today!  Thank you for entrusting me with your care and choosing [facility].  Alcide Evener, NP

## 2020-11-17 NOTE — ED Provider Notes (Signed)
[facility] DEPT Provider Note   CSN: [id number] Arrival date & time: 11/17/20  1539     History Chief Complaint  Patient presents with  . Dysphagia    IHAN PAT is a 69 y.o. male.  Patient is a 69 year old male with past medical history of throat cancer treated with radiation and chemotherapy approximately 10 years ago, coronary artery disease with stent, peripheral artery disease status post carotid endarterectomy in June 2021, and HIV disease.  Patient presents today for evaluation of difficulty swallowing and throat swelling that has worsened over the past several weeks.  According to the patient he is having difficulty swallowing even water.  Is also coughing up blood.  He reports a 14 pound weight loss in the past 2 weeks.  He was seen at the [facility] GI clinic today, then sent here for ENT consultation and further evaluation.  Patient last had a CT scan of his neck in June 2021 showing stenoses of both carotids and patient underwent endarterectomy at that time.  The history is provided by the patient.       Past Medical History:  Diagnosis Date  . Anxiety   . Cataract   . Chronic kidney disease   . 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   . Diabetes mellitus without complication ([place])   . GERD (gastroesophageal reflux disease)   . Heart murmur    CHILDHOOD  . History of kidney stones   . HIV DISEASE 12/01/2006  . HYPERLIPIDEMIA, WITH LOW HDL 01/03/2008  . Hypertension   . HYPERTENSION 12/01/2006  . HYPOGONADISM 12/23/2009  . Hypothyroidism   . Neuromuscular disorder ([place])   . SYPHILIS, [place], LATENT NOS 01/18/2007  . Thyroid disease   . 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 Tuberculosis    history of TB about 30 years ago    Patient  Active Problem List   Diagnosis Date Noted  . Dysphagia 07/15/2020  . Thrush 07/15/2020  . Carotid stenosis 06/22/2020  . Carotid stenosis, asymptomatic, right 05/18/2020  . Abnormal stress test 05/04/2020  . Malnutrition of moderate degree 04/16/2020  . Hyperosmolar hyperglycemic state (HHS) ([place]) 04/13/2020  . Diabetes ([place]) 04/13/2020  . Head injury 04/13/2020  . Diarrhea 04/13/2020  . AKI (acute kidney injury) ([place]) 04/13/2020  . Depression 10/01/2013  . Peripheral neuropathy 09/30/2013  . Renal insufficiency 08/19/2013  . Hypothyroidism 11/01/2012  . 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 Hyperglycemia 12/08/2011  . HYPOGONADISM 12/23/2009  . Oral cancer ([place]) 08/18/2008  . Dyslipidemia 01/03/2008  . SYPHILIS, [place], LATENT NOS 01/18/2007  . Human immunodeficiency virus (HIV) disease ([place]) 12/01/2006  . Essential hypertension 12/01/2006  . PROTEINURIA 12/01/2006    Past Surgical History:  Procedure Laterality Date  . COLONOSCOPY    . CORONARY ANGIOGRAPHY N/A 05/05/2020   Procedure: CORONARY ANGIOGRAPHY;  Surgeon: Nigel Mormon, MD;  Location: [facility];  Service: Cardiovascular;  Laterality: N/A;  . TONSILECTOMY, ADENOIDECTOMY, BILATERAL MYRINGOTOMY AND TUBES     tonsil cancer   . TONSILLECTOMY    . TRANSCAROTID ARTERY REVASCULARIZATION Right 05/18/2020   Procedure: RIGHT TRANSCAROTID ARTERY REVASCULARIZATION;  Surgeon: Elam Dutch, MD;  Location: [place];  Service: Vascular;  Laterality: Right;  . TRANSCAROTID ARTERY REVASCULARIZATION Left 06/22/2020   Procedure:  LEFT TRANSCAROTID ARTERY REVASCULARIZATION;  Surgeon: Elam Dutch, MD;  Location: [place];  Service: Vascular;  Laterality: Left;  . ULTRASOUND GUIDANCE FOR VASCULAR ACCESS Right 06/22/2020   Procedure: ULTRASOUND GUIDANCE FOR VASCULAR ACCESS;  Surgeon: Elam Dutch, MD;  [facility]: [facility];  Service: Vascular;  Laterality: Right;  . UPPER GASTROINTESTINAL ENDOSCOPY         Family  History  Problem Relation Age of Onset  . Diabetes Mother   . COPD Mother   . Heart attack Father   . Colon cancer Neg Hx   . Colon polyps Neg Hx   . Esophageal cancer Neg Hx   . Rectal cancer Neg Hx   . Stomach cancer Neg Hx     Social History   Tobacco Use  . Smoking status: Former Research scientist (life sciences)  . Smokeless tobacco: Never Used  . Tobacco comment: quit when he was 69 years old.  Vaping Use  . Vaping Use: Never used  Substance Use Topics  . Alcohol use: Yes    Alcohol/week: 2.0 standard drinks    Types: 2 Shots of liquor per week    Comment: "COUPLE TIMES A WEEK"  . Drug use: No    Comment: 40 years ago used multiple drugs    Home Medications Prior to Admission medications   Medication Sig Start Date End Date Taking? Authorizing Provider  aspirin EC 81 MG EC tablet Take 1 tablet (81 mg total) by mouth daily. 04/17/20   Ghimire, Henreitta Leber, MD  benazepril (LOTENSIN) 10 MG tablet Take 10 mg by mouth daily.     [provider]  blood glucose meter kit and supplies Dispense based on patient and insurance preference. Use up to four times daily as directed. (FOR ICD-10 E10.9, E11.9). 04/16/20   Ghimire, Henreitta Leber, MD  clonazePAM (KLONOPIN) 1 MG tablet TAKE 1 TABLET 3 TIMES A DAY AS NEEDED FOR ANXIETY Patient taking differently: Take 1 mg by mouth 3 (three) times daily as needed for anxiety.    Michel Bickers, MD  clopidogrel (PLAVIX) 75 MG tablet Take 1 tablet (75 mg total) by mouth daily. 04/17/20   Ghimire, Henreitta Leber, MD  CONTOUR NEXT TEST test strip  06/01/20   [provider]  diphenhydrAMINE (BENADRYL) 25 MG tablet Take 50 mg by mouth at bedtime as needed.     [provider]  dolutegravir (TIVICAY) 50 MG tablet Take 50 mg by mouth daily.  12/05/12 12/05/28  Truman Hayward, MD  fenofibrate (TRICOR) 145 MG tablet TAKE ONE TABLET BY MOUTH DAILY Patient taking differently: Take 145 mg by mouth daily. 05/04/20   Michel Bickers, MD  fluconazole (DIFLUCAN) 100  MG tablet Take 2 tablets on day one then one tablet daily for 13 days. 07/15/20   Zehr, Janett Billow D, PA-C  FLUoxetine (PROZAC) 20 MG capsule TAKE ONE CAPSULE BY MOUTH DAILY 07/06/20   Michel Bickers, MD  folic acid (FOLVITE) 1 MG tablet Take 2 tablets (2 mg total) by mouth daily. 04/17/20   Ghimire, Henreitta Leber, MD  HUMALOG KWIKPEN 100 UNIT/ML KwikPen Inject 1-15 Units into the skin 3 (three) times daily before meals. Per sliding scale 04/16/20   [provider]  insulin glargine (LANTUS) 100 UNIT/ML Solostar Pen Inject 26 Units into the skin daily at 10 pm. 04/16/20   Ghimire, Henreitta Leber, MD  Insulin Pen Needle 32G X 8 MM MISC Use as directed 04/16/20   Jonetta Osgood, MD  levothyroxine (SYNTHROID) 75 MCG tablet  Take by mouth. 08/24/20   [provider]  metoCLOPramide (REGLAN) 5 MG tablet Take 5 mg by mouth at bedtime.    [provider]  metoprolol succinate (TOPROL-XL) 25 MG 24 hr tablet Take 1 tablet (25 mg total) by mouth daily. Patient taking differently: Take 12.5 mg by mouth daily. 04/17/20   Ghimire, Henreitta Leber, MD  pantoprazole (PROTONIX) 40 MG tablet Take 40 mg by mouth daily.    [provider]  rosuvastatin (CRESTOR) 20 MG tablet Take 1 tablet (20 mg total) by mouth daily. 04/17/20   Ghimire, Henreitta Leber, MD  sertraline (ZOLOFT) 100 MG tablet Take 100 mg by mouth daily. 09/14/20   [provider]  temazepam (RESTORIL) 30 MG capsule Take 30 mg by mouth at bedtime. 07/26/16   [provider]  [place] 600-50-300 MG tablet TAKE ONE TABLET BY MOUTH DAILY Patient taking differently: Take 1 tablet by mouth daily. 12/23/19   Michel Bickers, MD  Dolutegravir Sodium (TIVICAY) 50 MG TABS Take 1 tablet (50 mg total) by mouth daily. 12/05/12 12/05/28  Truman Hayward, MD    Allergies    Codeine, Sulfamethoxazole-trimethoprim, and Sulfonamide derivatives  Review of Systems   Review of Systems  All other systems reviewed and are negative.   Physical  Exam Updated Vital Signs BP (!) 148/87   Pulse 94   Temp 98.5 F (36.9 C) (Oral)   Resp 15   Ht 5' 8.5" (1.74 m)   Wt 57.3 kg   SpO2 98%   BMI 18.94 kg/m   Physical Exam Vitals and nursing note reviewed.  Constitutional:      General: He is not in acute distress.    Appearance: He is well-developed and well-nourished. He is not diaphoretic.  HENT:     Head: Normocephalic and atraumatic.     Mouth/Throat:     Mouth: Oropharynx is clear and moist.     Comments: Patient with dark material that appears to be old blood within the oral cavity.  He has very poor dentition throughout.  The posterior oropharynx is difficult to assess as there appears to be postsurgical change of the anatomy. Cardiovascular:     Rate and Rhythm: Normal rate and regular rhythm.     Heart sounds: No murmur heard. No friction rub.  Pulmonary:     Effort: Pulmonary effort is normal. No respiratory distress.     Breath sounds: Normal breath sounds. No wheezing or rales.  Abdominal:     General: Bowel sounds are normal. There is no distension.     Palpations: Abdomen is soft.     Tenderness: There is no abdominal tenderness.  Musculoskeletal:        General: No edema. Normal range of motion.     Cervical back: Normal range of motion and neck supple.  Skin:    General: Skin is warm and dry.  Neurological:     Mental Status: He is alert and oriented to person, place, and time.     Coordination: Coordination normal.     ED Results / Procedures / Treatments   Labs (all labs ordered are listed, but only abnormal results are displayed) Labs Reviewed  COMPREHENSIVE METABOLIC PANEL - Abnormal; Notable for the following components:      Result Value   Glucose, Bld 105 (*)    BUN 48 (*)    Creatinine, Ser 1.77 (*)    Total Protein 8.8 (*)    AST 45 (*)  Total Bilirubin 1.4 (*)    GFR, Estimated 41 (*)    All other components within normal limits  CBC WITH DIFFERENTIAL/PLATELET - Abnormal; Notable  for the following components:   RBC 3.80 (*)    Hemoglobin 12.0 (*)    HCT 38.3 (*)    MCV 100.8 (*)    All other components within normal limits  LACTIC ACID, PLASMA  LACTIC ACID, PLASMA  URINALYSIS, ROUTINE W REFLEX MICROSCOPIC    EKG None  Radiology No results found.  Procedures Procedures (including critical care time)  Medications Ordered in ED Medications  sodium chloride 0.9 % bolus 1,000 mL (has no administration in time range)    ED Course  I have reviewed the triage vital signs and the nursing notes.  Pertinent labs & imaging results that were available during my care of the patient were reviewed by me and considered in my medical decision making (see chart for details).    MDM Rules/Calculators/A&P  Patient with history of throat cancer presenting with complaints of irritation to his throat and difficulty swallowing.  Patient has had recent weight loss.  He is now to the point he is having extreme difficulty swallowing even water.  His electrolytes reflect dehydration with worsening renal function and elevated BUN.  Patient care discussed with Dr. Benjamine Mola from ENT who is familiar with the patient.  We have agreed that a CT scan of the neck is indicated to rule out recurrence.  This has been performed and is negative.  Patient to be admitted to the hospitalist.  He will likely require a G-tube and possibly gastroenterology/ENT to further evaluate his upper esophagus/pharynx.  Final Clinical Impression(s) / ED Diagnoses Final diagnoses:  None    Rx / DC Orders ED Discharge Orders    None       Veryl Speak, MD 11/17/20 2215

## 2020-11-17 NOTE — Progress Notes (Signed)
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 He was last seen in our office by Alonza Bogus 07/15/2020 due to having dysphagia symptoms and constipation.  He was assessed to have oral thrush at that time and he was  prescribed a course of Diflucan x 14 days. An EGD was deferred due to his recent carotid artery stents requiring Plavix without interruption. He presents to our office today with odynophagia and worsening dysphagia. He is unable to swallow solid food for the past 2 weeks. He is tolerating sips of water and 4 ounces of Ensure daily. Loss of voice has progressively worsened over the past 4 days. He is swallowing his saliva and secretions, no drooling. He is spitting up blood. No vomiting or hematemesis. He has lost 14lbs over the past 2 weeks. No recent antibiotics or steroid use. No fevers. His most recent EGD was 07/03/2017 which showed a benign appearing esophageal stenosis which was dilated, a small hiatal hernia and biopsies showed evidence of acid reflux without Barrett's esophagus. No H. Pylori. He is passing soft brown stools most days. No rectal bleeding or black stools. No NSAID use. He underwent a colonoscopy in the [place] 2000's which he reported was normal. He is urinating clear yellow urine x 4 thus far today.   EGD 07/03/2017: - Small hiatal hernia. - Salmon-colored mucosa suspicious for short-segment Barrett's esophagus. Biopsied. - Benign-appearing esophageal stenosis. Dilated. - Normal stomach. - Normal examined duodenum. Surgical [P], GE junction - GASTROESOPHAGEAL JUNCTION MUCOSA WITH  INFLAMMATION CONSISTENT WITH REFLUX. - WARTHIN-STARRY STAIN NEGATIVE FOR HELICOBACTER PYLORI. - NO INTESTINAL METAPLASIA, DYSPLASIA OR MALIGNANCY.    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    Tuberculosis    history of TB about 30 years ago    Past Surgical History:  Procedure Laterality Date   COLONOSCOPY     CORONARY ANGIOGRAPHY N/A 05/05/2020   Procedure: CORONARY ANGIOGRAPHY;  Surgeon: Nigel Mormon, MD;  Location: [facility];  Service: Cardiovascular;  Laterality: N/A;   TONSILECTOMY, ADENOIDECTOMY, BILATERAL MYRINGOTOMY AND TUBES     tonsil cancer    TONSILLECTOMY     TRANSCAROTID ARTERY REVASCULARIZATION Right 05/18/2020   Procedure: RIGHT TRANSCAROTID ARTERY REVASCULARIZATION;  Surgeon: Elam Dutch, MD;  Location: [facility];  Service: Vascular;  Laterality:  Right;   TRANSCAROTID ARTERY REVASCULARIZATION Left 06/22/2020   Procedure: LEFT TRANSCAROTID ARTERY REVASCULARIZATION;  Surgeon: Elam Dutch, MD;  Location: [facility] OR;  Service: Vascular;  Laterality: Left;   ULTRASOUND GUIDANCE FOR VASCULAR ACCESS Right 06/22/2020   Procedure: ULTRASOUND GUIDANCE FOR VASCULAR ACCESS;  Surgeon: Elam Dutch, MD;  Location: [facility] OR;   Service: Vascular;  Laterality: Right;   UPPER GASTROINTESTINAL ENDOSCOPY     Current Outpatient Medications on File Prior to Visit  Medication Sig Dispense Refill   aspirin EC 81 MG EC tablet Take 1 tablet (81 mg total) by mouth daily. 30 tablet 0   benazepril (LOTENSIN) 10 MG tablet Take 10 mg by mouth daily.      blood glucose meter kit and supplies Dispense based on patient and insurance preference. Use up to four times daily as directed. (FOR ICD-10 E10.9, E11.9). 1 each 0   clonazePAM (KLONOPIN) 1 MG tablet TAKE 1 TABLET 3 TIMES A DAY AS NEEDED FOR ANXIETY (Patient taking differently: Take 1 mg by mouth 3 (three) times daily as needed for anxiety.) 90 tablet 2   clopidogrel (PLAVIX) 75 MG tablet Take 1 tablet (75 mg total) by mouth daily. 30 tablet 0   CONTOUR NEXT TEST test strip      diphenhydrAMINE (BENADRYL) 25 MG tablet Take 50 mg by mouth at bedtime as needed.      dolutegravir (TIVICAY) 50 MG tablet Take 50 mg by mouth daily.      fenofibrate (TRICOR) 145 MG tablet TAKE ONE TABLET BY MOUTH DAILY (Patient taking differently: Take 145 mg by mouth daily.) 90 tablet 0   fluconazole (DIFLUCAN) 100 MG tablet Take 2 tablets on day one then one tablet daily for 13 days. 15 tablet 0   FLUoxetine (PROZAC) 20 MG capsule TAKE ONE CAPSULE BY MOUTH DAILY 90 capsule 1   folic acid (FOLVITE) 1 MG tablet Take 2 tablets (2 mg total) by mouth daily. 60 tablet 0   HUMALOG KWIKPEN 100 UNIT/ML KwikPen Inject 1-15 Units into the skin 3 (three) times daily before meals. Per sliding scale     insulin glargine (LANTUS) 100 UNIT/ML Solostar Pen Inject 26 Units into the skin daily at 10 pm. 15 mL 0   Insulin Pen Needle 32G X 8 MM MISC Use as directed 100 each 0   levothyroxine (SYNTHROID) 75 MCG tablet Take by mouth.     metoCLOPramide (REGLAN) 5 MG tablet Take 5 mg by mouth at bedtime.     metoprolol succinate (TOPROL-XL) 25 MG 24 hr tablet Take 1 tablet (25 mg total) by mouth daily.  (Patient taking differently: Take 12.5 mg by mouth daily.) 30 tablet 0   pantoprazole (PROTONIX) 40 MG tablet Take 40 mg by mouth daily.     rosuvastatin (CRESTOR) 20 MG tablet Take 1 tablet (20 mg total) by mouth daily. 30 tablet 0   sertraline (ZOLOFT) 100 MG tablet Take 100 mg by mouth daily.     temazepam (RESTORIL) 30 MG capsule Take 30 mg by mouth at bedtime.  5   TRIUMEQ 600-50-300 MG tablet TAKE ONE TABLET BY MOUTH DAILY (Patient taking differently: Take 1 tablet by mouth daily.) 90 tablet 3   [DISCONTINUED] Dolutegravir Sodium (TIVICAY) 50 MG TABS Take 1 tablet (50 mg total) by mouth daily. 30 tablet 11   No current facility-administered medications on file prior to visit.   Allergies  Allergen Reactions   Codeine Other (See Comments)    "makes me crazy"  Sulfamethoxazole-Trimethoprim Itching and Other (See Comments)    "makes me crazy"   Sulfonamide Derivatives Itching and Other (See Comments)    "makes me crazy"     Current Medications, Allergies, Past Medical History, Past Surgical History, Family History and Social History were reviewed in [employer] record.   Review of Systems:   Constitutional: + weight loss. Respiratory: Negative for shortness of breath.   Cardiovascular: Negative for chest pain, palpitations and leg swelling.  Gastrointestinal: See HPI.  Musculoskeletal: Negative for back pain or muscle aches.  Neurological: Negative for dizziness, headaches or paresthesias.   Physical Exam: BP 116/64 (BP Location: Left Arm, Patient Position: Sitting, Cuff Size: Normal)    Pulse (!) 112    Ht 5' 8.5" (1.74 m) Comment: height measured without shoes   Wt 126 lb 6 oz (57.3 kg)    BMI 18.94 kg/m    Wt Readings from Last 3 Encounters:  11/17/20 126 lb 6 oz (57.3 kg)  09/17/20 141 lb (64 kg)  07/16/20 140 lb (63.5 kg)   General: cachectic 69 year old male, slow gait but steady. Voice is barely audible. Head: Normocephalic and  atraumatic. Eyes: No scleral icterus. Conjunctiva pink . Ears: Normal auditory acuity. Mouth: Brown exudate to the right upper palate concerning for an oral abscess, extremely poor dentition with surrounding erythema, yellow-brown coating on tongue, right posterior pharynx with a large area of dark blood with possible hematoma versus mass. Lungs: Clear throughout to auscultation. Heart: Regular rate and rhythm, no murmur. Abdomen: Soft, nontender and nondistended. No masses or hepatomegaly. Norm sounds x 4 quadrants.  Rectal: Deferred  Musculoskeletal: Symmetrical with no gross deformities. Extremities: No edema. Neurological: Alert oriented x 4. Generalized weakness.  Psychological: Alert and cooperative. Normal mood and affect  Assessment and Recommendations:  54.  69 year old male HIV positive with prior history of a benign esophageal stenosis presents with severe odynophagia and dysphagia x 2 weeks. Exam today concerning for an oral abscess with candidiasis. History of tonsillar cancer, rule out recurrence. -Patient sent directly to [facility] ED for ENT evaluation, IV hydration and pain management.  He most likely will require hospital admission -Eventual EGD with esophageal dilatation  2.  Severe malnutrition weight loss secondary to # 1  3. Carotid artery disease secondary to past radiation s/p right TCAR stent placement 04/2020 and left TCAR  06/2020 on Plavix   4. Colon cancer screening -No plans for a colonoscopy at this time

## 2020-11-17 NOTE — ED Notes (Signed)
Per GI-needs ENT consult for possible esophageal mass vs hematoma-unable to swallow-call Dr [phone]

## 2020-11-17 NOTE — ED Triage Notes (Signed)
Patient went to [facility] due to problems swallowing and weight loss. Patient was sent to the Ed for oral abscess, oral candidiasis and posterior pharnyx hematoma.

## 2020-11-17 NOTE — H&P (Signed)
History and Physical    Christopher [name] MCN:[id number] DOB: Dec 11, 1950 DOA: 11/17/2020  PCP: Fanny Bien, MD  Patient coming from: GI clinic  I have personally briefly reviewed patient's old medical records in [place]  Chief Complaint: Odynophagia/dysphagia  HPI: Christopher [name] is a 69 y.o. male with medical history significant for tonsillar cancer (s/p surgery, radiation, chemotherapy) with chronic dysphagia and esophageal stricture, HIV, CAD, CKD stage III, insulin-dependent diabetes, HTN, HLD, hypothyroidism, CAS s/p bilateral revascularization/stenting, and depression/anxiety who presents to the ED for evaluation of worsening odynophagia/dysphagia.  Patient states he has been having 4-5 weeks of progressive dysphagia/odynophagia.  He has been having a lot of oral pain and coughing up blood.  He has not seen any green/yellow sputum production.  He denies any subjective fevers, chills, diaphoresis, nausea, vomiting, abdominal pain, dysuria, diarrhea.  He has been able to chew his medications and take sips of water but otherwise not able to maintain any adequate oral intake.  He has had approximately 14 pound weight loss over the last 2 weeks.  He was seen in GI clinic earlier today and sent to the ED for further evaluation.  ED Course:  Vitals showed BP 127/91, pulse 102, RR 18, temp 97.9 F, SPO2 94% on room air.  Labs show creatinine 1.77, BUN 48 (prior creatinine 1.23 with BUN 11 on 06/23/2020), sodium 139, potassium 4.1, bicarb 25, serum glucose 105, AST 45, ALT 32, alk phos 49, total bilirubin 1.4, WBC 5.9, hemoglobin 12.0, platelets 265,000, lactic acid 0.8 > 0.5.  Urinalysis is negative for UTI.  SARS-CoV-2 PCR panel was collected and pending.  CT soft tissue neck with contrast shows mild edema of the epiglottis and supraglottic larynx with patent airway.  Posttreatment changes in the neck without cervical lymphadenopathy or visible pharyngeal mass reported.  Patient  was given 1 L normal saline.  EDP discussed with patient's ENT, Dr. Benjamine Mola, who felt patient may ultimately need G-tube placement.  He recommended medical admission and will see in a.m.  The hospitalist service was consulted to admit for further evaluation and management.  Review of Systems: All systems reviewed and are negative except as documented in history of present illness above.   Past Medical History:  Diagnosis Date  . Anxiety   . Cataract   . Chronic kidney disease   . 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   . Diabetes mellitus without complication ([place])   . GERD (gastroesophageal reflux disease)   . Heart murmur    CHILDHOOD  . History of kidney stones   . HIV DISEASE 12/01/2006  . HYPERLIPIDEMIA, WITH LOW HDL 01/03/2008  . Hypertension   . HYPERTENSION 12/01/2006  . HYPOGONADISM 12/23/2009  . Hypothyroidism   . Neuromuscular disorder ([place])   . SYPHILIS, [place], LATENT NOS 01/18/2007  . Thyroid disease   . Tonsillar cancer ([place])    tonsillar ca   . Tuberculosis    history of TB about 30 years ago    Past Surgical History:  Procedure Laterality Date  . COLONOSCOPY    . CORONARY ANGIOGRAPHY N/A 05/05/2020   Procedure: CORONARY ANGIOGRAPHY;  Surgeon: Nigel Mormon, MD;  Location: [facility];  Service: Cardiovascular;  Laterality: N/A;  . TONSILECTOMY, ADENOIDECTOMY, BILATERAL MYRINGOTOMY AND TUBES     tonsil  cancer   . TONSILLECTOMY    . TRANSCAROTID ARTERY REVASCULARIZATION Right 05/18/2020   Procedure: RIGHT TRANSCAROTID ARTERY REVASCULARIZATION;  Surgeon: Elam Dutch, MD;  Location: [facility];  Service: Vascular;  Laterality: Right;  . TRANSCAROTID ARTERY REVASCULARIZATION Left 06/22/2020   Procedure: LEFT TRANSCAROTID ARTERY REVASCULARIZATION;  Surgeon: Elam Dutch, MD;  Location: [facility];  Service: Vascular;  Laterality: Left;  . ULTRASOUND GUIDANCE FOR VASCULAR ACCESS Right 06/22/2020   Procedure: ULTRASOUND GUIDANCE FOR VASCULAR ACCESS;  Surgeon: Elam Dutch, MD;  Location[facility];  Service: Vascular;  Laterality: Right;  . UPPER GASTROINTESTINAL ENDOSCOPY      Social History:  reports that he has quit smoking. He has never used smokeless tobacco. He reports current alcohol use of about 2.0 standard drinks of alcohol per week. He reports that he does not use drugs.  Allergies  Allergen Reactions  . Codeine Other (See Comments)    "makes me crazy"  . Sulfamethoxazole-Trimethoprim Itching and Other (See Comments)    "makes me crazy"  . Sulfonamide Derivatives Itching and Other (See Comments)    "makes me crazy"    Family History  Problem Relation Age of Onset  . Diabetes Mother   . COPD Mother   . Heart attack Father   . Colon cancer Neg Hx   . Colon polyps Neg Hx   . Esophageal cancer Neg Hx   . Rectal cancer Neg Hx   . Stomach cancer Neg Hx      Prior to Admission medications   Medication Sig Start Date End Date Taking? Authorizing Provider  aspirin EC 81 MG EC tablet Take 1 tablet (81 mg total) by mouth daily. 04/17/20   Ghimire, Henreitta Leber, MD  benazepril (LOTENSIN) 10 MG tablet Take 10 mg by mouth daily.     [provider]  blood glucose meter kit and supplies Dispense based on patient and insurance preference. Use up to four times daily as directed. (FOR ICD-10 E10.9, E11.9). 04/16/20   Ghimire, Henreitta Leber, MD  clonazePAM (KLONOPIN) 1 MG tablet TAKE 1 TABLET 3 TIMES A DAY AS NEEDED FOR ANXIETY Patient taking differently: Take 1 mg by mouth 3 (three) times daily as needed for anxiety.    Michel Bickers, MD  clopidogrel (PLAVIX) 75 MG tablet Take 1 tablet (75 mg total) by mouth daily. 04/17/20   Ghimire, Henreitta Leber, MD  CONTOUR NEXT TEST test strip  06/01/20   [provider]  diphenhydrAMINE (BENADRYL) 25 MG  tablet Take 50 mg by mouth at bedtime as needed.     [provider]  dolutegravir (TIVICAY) 50 MG tablet Take 50 mg by mouth daily.  12/05/12 12/05/28  Truman Hayward, MD  fenofibrate (TRICOR) 145 MG tablet TAKE ONE TABLET BY MOUTH DAILY Patient taking differently: Take 145 mg by mouth daily. 05/04/20   Michel Bickers, MD  fluconazole (DIFLUCAN) 100 MG tablet Take 2 tablets on day one then one tablet daily for 13 days. 07/15/20   Zehr, Janett Billow D, PA-C  FLUoxetine (PROZAC) 20 MG capsule TAKE ONE CAPSULE BY MOUTH DAILY 07/06/20   Michel Bickers, MD  folic acid (FOLVITE) 1 MG tablet Take 2 tablets (2 mg total) by mouth daily. 04/17/20   Ghimire, Henreitta Leber, MD  HUMALOG KWIKPEN 100 UNIT/ML KwikPen Inject 1-15 Units into the skin 3 (three) times daily before meals. Per sliding scale 04/16/20   [provider]  insulin glargine (LANTUS) 100 UNIT/ML Solostar Pen Inject 26 Units  into the skin daily at 10 pm. 04/16/20   Ghimire, Henreitta Leber, MD  Insulin Pen Needle 32G X 8 MM MISC Use as directed 04/16/20   Jonetta Osgood, MD  levothyroxine (SYNTHROID) 75 MCG tablet Take by mouth. 08/24/20   [provider]  metoCLOPramide (REGLAN) 5 MG tablet Take 5 mg by mouth at bedtime.    [provider]  metoprolol succinate (TOPROL-XL) 25 MG 24 hr tablet Take 1 tablet (25 mg total) by mouth daily. Patient taking differently: Take 12.5 mg by mouth daily. 04/17/20   Ghimire, Henreitta Leber, MD  pantoprazole (PROTONIX) 40 MG tablet Take 40 mg by mouth daily.    [provider]  rosuvastatin (CRESTOR) 20 MG tablet Take 1 tablet (20 mg total) by mouth daily. 04/17/20   Ghimire, Henreitta Leber, MD  sertraline (ZOLOFT) 100 MG tablet Take 100 mg by mouth daily. 09/14/20   [provider]  temazepam (RESTORIL) 30 MG capsule Take 30 mg by mouth at bedtime. 07/26/16   [provider]  [place] 600-50-300 MG tablet TAKE ONE TABLET BY MOUTH DAILY Patient taking differently: Take 1  tablet by mouth daily. 12/23/19   Michel Bickers, MD  Dolutegravir Sodium (TIVICAY) 50 MG TABS Take 1 tablet (50 mg total) by mouth daily. 12/05/12 12/05/28  Truman Hayward, MD    Physical Exam: Vitals:   11/17/20 1930 11/17/20 2000 11/17/20 2030 11/17/20 2037  BP: (!) 159/80 (!) 143/81  (!) 159/89  Pulse: 88 79 93 93  Resp: 17 15 18 20   Temp:    97.6 F (36.4 C)  TempSrc:    Oral  SpO2: 97% 97% 96% 98%  Weight:      Height:       Constitutional: Cachectic chronically ill-appearing man resting in bed with head elevated, NAD, calm, comfortable Eyes: PERRL, lids and conjunctivae normal ENMT: Fresh and dried blood in oral cavity in the lips teeth.  Poor oral dentition.  Voice is very hoarse.  He has no drooling or active oral secretions.  Currently protecting his airway. Neck: normal, supple. Respiratory: clear to auscultation bilaterally, no wheezing, no crackles. Normal respiratory effort. No accessory muscle use.  Cardiovascular: Regular rate and rhythm, no murmurs / rubs / gallops. No extremity edema. 2+ pedal pulses. Abdomen: no tenderness, no masses palpated. No hepatosplenomegaly. Bowel sounds positive.  Musculoskeletal: no clubbing / cyanosis. No joint deformity upper and lower extremities. Good ROM, no contractures. Normal muscle tone.  Skin: no rashes, lesions, ulcers. No induration Neurologic: CN 2-12 grossly intact. Sensation intact, Strength 5/5 in all 4.  Psychiatric: Normal judgment and insight. Alert and oriented x 3. Normal mood.   Labs on Admission: I have personally reviewed following labs and imaging studies  CBC: Recent Labs  Lab 11/17/20 1618  WBC 5.9  NEUTROABS 4.5  HGB 12.0*  HCT 38.3*  MCV 100.8*  PLT 585   Basic Metabolic Panel: Recent Labs  Lab 11/17/20 1618  NA 139  K 4.1  CL 100  CO2 25  GLUCOSE 105*  BUN 48*  CREATININE 1.77*  CALCIUM 10.3   GFR: Estimated Creatinine Clearance: 31.5 mL/min (A) (by C-G formula based on SCr of 1.77  mg/dL (H)). Liver Function Tests: Recent Labs  Lab 11/17/20 1618  AST 45*  ALT 32  ALKPHOS 49  BILITOT 1.4*  PROT 8.8*  ALBUMIN 4.3   No results for input(s): LIPASE, AMYLASE in the last 168 hours. No results for input(s): AMMONIA in the last  168 hours. Coagulation Profile: No results for input(s): INR, PROTIME in the last 168 hours. Cardiac Enzymes: No results for input(s): CKTOTAL, CKMB, CKMBINDEX, TROPONINI in the last 168 hours. BNP (last 3 results) No results for input(s): PROBNP in the last 8760 hours. HbA1C: No results for input(s): HGBA1C in the last 72 hours. CBG: No results for input(s): GLUCAP in the last 168 hours. Lipid Profile: No results for input(s): CHOL, HDL, LDLCALC, TRIG, CHOLHDL, LDLDIRECT in the last 72 hours. Thyroid Function Tests: No results for input(s): TSH, T4TOTAL, FREET4, T3FREE, THYROIDAB in the last 72 hours. Anemia Panel: No results for input(s): VITAMINB12, FOLATE, FERRITIN, TIBC, IRON, RETICCTPCT in the last 72 hours. Urine analysis:    Component Value Date/Time   COLORURINE YELLOW 11/17/2020 1846   APPEARANCEUR CLEAR 11/17/2020 1846   LABSPEC 1.017 11/17/2020 1846   PHURINE 5.0 11/17/2020 1846   GLUCOSEU NEGATIVE 11/17/2020 1846   HGBUR MODERATE (A) 11/17/2020 1846   BILIRUBINUR NEGATIVE 11/17/2020 1846   KETONESUR 20 (A) 11/17/2020 1846   PROTEINUR 30 (A) 11/17/2020 1846   NITRITE NEGATIVE 11/17/2020 1846   LEUKOCYTESUR NEGATIVE 11/17/2020 1846    Radiological Exams on Admission: CT Soft Tissue Neck W Contrast  Result Date: 11/17/2020 CLINICAL DATA:  Difficulty swallowing. EXAM: CT NECK WITH CONTRAST TECHNIQUE: Multidetector CT imaging of the neck was performed using the standard protocol following the bolus administration of intravenous contrast. CONTRAST:  5m OMNIPAQUE IOHEXOL 300 MG/ML  SOLN COMPARISON:  10/31/2011 FINDINGS: PHARYNX AND LARYNX: There is mild edema of the epiglottis and supraglottic larynx. The airway is  clearly patent. The nasopharynx and oropharynx are normal. SALIVARY GLANDS: Absence or severe atrophy of the submandibular glands. Parotid glands are unremarkable. THYROID: Normal. LYMPH NODES: No enlarged or abnormal density lymph nodes. VASCULAR: Bilateral carotid stents. LIMITED INTRACRANIAL: Normal. VISUALIZED ORBITS: Normal. MASTOIDS AND VISUALIZED PARANASAL SINUSES: No fluid levels or advanced mucosal thickening. No mastoid effusion. SKELETON: No bony spinal canal stenosis. No lytic or blastic lesions. UPPER CHEST: Clear. OTHER: None. IMPRESSION: 1. Mild edema of the epiglottis and supraglottic larynx, which may be due to prior radiation therapy. Acute supraglottitis may also cause this appearance. The airway is clearly patent. 2. Post treatment changes in the neck without cervical lymphadenopathy. No visible pharyngeal mass. Aortic atherosclerosis (ICD10-I70.0). Electronically Signed   By: KUlyses JarredM.D.   On: 11/17/2020 20:29    EKG: Not performed.  Assessment/Plan Principal Problem:   Odynophagia Active Problems:   Human immunodeficiency virus (HIV) disease (H[facility])   Hyperlipidemia associated with type 2 diabetes mellitus ([facility]   Hypertension associated with diabetes ([facility]   Hypothyroidism   Diabetes ([facility]   Acute kidney injury superimposed on CKD ([facility]  WJAGGER DEMONTEis a 69y.o. male with medical history significant for tonsillar cancer (s/p surgery, radiation, chemotherapy) with chronic dysphagia and esophageal stricture, HIV, CAD, CKD stage III, insulin-dependent diabetes, HTN, HLD, hypothyroidism, CAS s/p bilateral revascularization/stenting, and depression/anxiety who is admitted with progressive odynophagia/dysphagia.  Odynophagia/dysphagia History of tonsillar cancer s/p surgical resection, radiation, and chemotherapy: CT soft tissue neck shows mild edema of the epiglottis and supraglottic larynx with patent airway.  Unable to maintain adequate oral intake for several weeks.   He is having bloody sputum production. -ENT to see in a.m., anticipate he will need G-tube placement -Keep n.p.o. -Start IV fluid hydration overnight  AKI on CKD stage IIIa: Likely due to dehydration from poor oral intake.  Started on IV fluid hydration tonight.  Recheck labs in a.m.  HIV: Resume meds when able.  Insulin-dependent diabetes: Place on sensitive SSI q4h while NPO.  Hypertension: Hold home benazepril given AKI.  Hyperlipidemia: Statin on hold while NPO.  Hypothyroidism: Resume Synthroid when able.  CAD: Denies any chest pain.  Aspirin/Plavix/statin on hold for now.  Carotid artery stenosis s/p bilateral revascularization/stent placement: Underwent right sided revascularization on 05/10/2020 and left on 06/22/2020 by Dr. Oneida Alar.  He is to be on aspirin/Plavix indefinitely however these are on hold for now due to his n.p.o. status and ongoing hemoptysis.  Depression/anxiety: Resume home meds when able.  DVT prophylaxis: SCDs only for now given bloody oral secretions Code Status: DNR, confirmed with patient Family Communication: Discussed with patient significant other over speaker phone Disposition Plan: From home, dispo pending further ENT and likely GI evaluation. Unable to maintain any adequate oral intake, may need G-tube placement. Consults called: ENT to see in a.m. per EDP Admission status:  Status is: Inpatient  Remains inpatient appropriate because:IV treatments appropriate due to intensity of illness or inability to take PO   Dispo: The patient is from: Home              Anticipated d/c is to: Home              Anticipated d/c date is: 3 days              Patient currently is not medically stable to d/c.   Zada Finders MD [facility]  If 7PM-7AM, please contact night-coverage [email or web address]  11/17/2020, 9:38 PM

## 2020-11-18 DIAGNOSIS — B20 Human immunodeficiency virus [HIV] disease: Secondary | ICD-10-CM | POA: Diagnosis not present

## 2020-11-18 DIAGNOSIS — R1310 Dysphagia, unspecified: Secondary | ICD-10-CM | POA: Diagnosis not present

## 2020-11-18 DIAGNOSIS — E1169 Type 2 diabetes mellitus with other specified complication: Secondary | ICD-10-CM | POA: Diagnosis not present

## 2020-11-18 DIAGNOSIS — I152 Hypertension secondary to endocrine disorders: Secondary | ICD-10-CM

## 2020-11-18 DIAGNOSIS — Z85819 Personal history of malignant neoplasm of unspecified site of lip, oral cavity, and pharynx: Secondary | ICD-10-CM

## 2020-11-18 DIAGNOSIS — E039 Hypothyroidism, unspecified: Secondary | ICD-10-CM

## 2020-11-18 DIAGNOSIS — E1159 Type 2 diabetes mellitus with other circulatory complications: Secondary | ICD-10-CM

## 2020-11-18 DIAGNOSIS — E785 Hyperlipidemia, unspecified: Secondary | ICD-10-CM

## 2020-11-18 LAB — BASIC METABOLIC PANEL
Anion gap: 14 (ref 5–15)
BUN: 34 mg/dL — ABNORMAL HIGH (ref 8–23)
CO2: 26 mmol/L (ref 22–32)
Calcium: 9.3 mg/dL (ref 8.9–10.3)
Chloride: 101 mmol/L (ref 98–111)
Creatinine, Ser: 1.19 mg/dL (ref 0.61–1.24)
GFR, Estimated: >60 mL/min (ref >60)
Glucose, Bld: 82 mg/dL (ref 70–99)
Potassium: 3.6 mmol/L (ref 3.5–5.1)
Sodium: 141 mmol/L (ref 135–145)

## 2020-11-18 LAB — CBG MONITORING, ED
Glucose-Capillary: 80 mg/dL (ref 70–99)
Glucose-Capillary: 88 mg/dL (ref 70–99)
Glucose-Capillary: 90 mg/dL (ref 70–99)

## 2020-11-18 LAB — CBC
HCT: 33.6 % — ABNORMAL LOW (ref 39.0–52.0)
Hemoglobin: 10.6 g/dL — ABNORMAL LOW (ref 13.0–17.0)
MCH: 31.2 pg (ref 26.0–34.0)
MCHC: 31.5 g/dL (ref 30.0–36.0)
MCV: 98.8 fL (ref 80.0–100.0)
Platelets: 223 10*3/uL (ref 150–400)
RBC: 3.4 MIL/uL — ABNORMAL LOW (ref 4.22–5.81)
RDW: 14.8 % (ref 11.5–15.5)
WBC: 4.1 10*3/uL (ref 4.0–10.5)
nRBC: 0 % (ref 0.0–0.2)

## 2020-11-18 LAB — HEMOGLOBIN A1C
Hgb A1c MFr Bld: 5 % (ref 4.8–5.6)
Mean Plasma Glucose: 96.8 mg/dL

## 2020-11-18 LAB — GLUCOSE, CAPILLARY
Glucose-Capillary: 91 mg/dL (ref 70–99)
Glucose-Capillary: 98 mg/dL (ref 70–99)

## 2020-11-18 LAB — PHOSPHORUS: Phosphorus: 1.6 mg/dL — ABNORMAL LOW (ref 2.5–4.6)

## 2020-11-18 LAB — MAGNESIUM: Magnesium: 1.8 mg/dL (ref 1.7–2.4)

## 2020-11-18 MED ORDER — SODIUM CHLORIDE 0.9 % IV SOLN
INTRAVENOUS | Status: DC | PRN
  Administered 2020-11-18: 17:00:00 1000 mL via INTRAVENOUS

## 2020-11-18 MED ORDER — DEXTROSE-NACL 5-0.45 % IV SOLN: INTRAVENOUS | Status: DC

## 2020-11-18 NOTE — ED Notes (Signed)
Called transport to take patient to [phone]

## 2020-11-18 NOTE — ED Notes (Signed)
Attempted to call [facility] to check on status of bed with no answer.will call back

## 2020-11-18 NOTE — ED Notes (Signed)
Attempted to call report to [phone] with no answer. Will call back

## 2020-11-18 NOTE — ED Notes (Signed)
Called spouse and informed of bed placement.

## 2020-11-18 NOTE — ED Notes (Signed)
Per ENT patient provided ice chips and water to sip on, visitor to room

## 2020-11-18 NOTE — Progress Notes (Signed)
[facility]  PROGRESS NOTE  KOUA GROWNEY [id number] DOB: 15-Sep-1951 DOA: 11/17/2020 PCP: Fanny Bien, MD   Brief HPI:   69 year old male with history of right tonsillar cancer, status post surgery, radiation, chemotherapy also has history of chronic dysphagia, esophageal stricture, HIV, CAD, CKD stage III, diabetes mellitus type 2, hypertension, hyperlipidemia, hypothyroidism, carotid artery stenosis s/p bilateral revascularization/stenting, depression/anxiety came to ED with worsening odynophagia, dysphagia also coughing up blood.    Subjective   Patient seen and examined, he was coughing up blood this morning.  Seems to have improved.  Hemoglobin is stable.  He has been seen by ENT.   Assessment/Plan:     1. Hemoptysis versus hematemesis-patient  has history of tonsillar cancer s/p chemo XRT.  He was evaluated by ENT, no obvious source of bleeding noted on bronchoscopy.  Recommend GI evaluation.  Will consult GI for EGD in a.m.  Discussed with Dr. Havery Moros from [facility].  Will keep patient n.p.o. after midnight.  Start D5 half-normal saline at 75 mL/h. 2. Acute kidney injury on CKD stage IIIa-likely from dehydration, poor p.o. intake.  Resolved after IV hydration. 3. HIV-we will hold meds at this time.  Plan for EGD in a.m.  Will resume after EGD. 4. Diabetes mellitus type 2-continue sliding scale insulin with NovoLog, check CBG every 4 hours.  Started on D5 half-normal saline as above. 5. Hypertension-benazepril on hold given AKI. 6. Hypothyroidism-Synthroid on hold, patient n.p.o. 7. CAD-no chest pain, aspirin/Plavix/statin on hold for now. 8. Carotid artery stenosis s/p bilateral revascularization/stent placement-underwent right-sided revascularization on 05/10/2020 and left-sided on 8-21 by Dr. Oneida Alar.  He is on aspirin/Plavix indefinitely however these medications on hold due to ongoing hemoptysis.      COVID-19 Labs  No results for input(s): DDIMER,  FERRITIN, LDH, CRP in the last 72 hours.  Lab Results  Component Value Date   SARSCOV2NAA NEGATIVE 11/17/2020   [place] NEGATIVE 06/19/2020   [place] NEGATIVE 05/14/2020   [place] NEGATIVE 05/01/2020     Scheduled medications:   . insulin aspart  0-9 Units Subcutaneous Q4H         CBG: Recent Labs  Lab 11/17/20 2314 11/18/20 0348 11/18/20 0718 11/18/20 1135 11/18/20 1639  GLUCAP 118* 80 88 90 91    SpO2: 96 %    CBC: Recent Labs  Lab 11/17/20 1618 11/18/20 0545  WBC 5.9 4.1  NEUTROABS 4.5  --   HGB 12.0* 10.6*  HCT 38.3* 33.6*  MCV 100.8* 98.8  PLT 265 [id number]    Basic Metabolic Panel: Recent Labs  Lab 11/17/20 1618 11/18/20 0545  NA 139 141  K 4.1 3.6  CL 100 101  CO2 25 26  GLUCOSE 105* 82  BUN 48* 34*  CREATININE 1.77* 1.19  CALCIUM 10.3 9.3  MG  --  1.8  PHOS  --  1.6*     Liver Function Tests: Recent Labs  Lab 11/17/20 1618  AST 45*  ALT 32  ALKPHOS 49  BILITOT 1.4*  PROT 8.8*  ALBUMIN 4.3     Antibiotics: Anti-infectives (From admission, onward)   None       DVT prophylaxis: SCDs  Code Status: DNR  Family Communication: No family at bedside   Consultants:  ENT  Procedures:      Objective   Vitals:   11/18/20 1300 11/18/20 1600 11/18/20 1630 11/18/20 1630  BP: (!) 155/93 (!) 160/100 (!) 143/88 (!) 143/88  Pulse: 93 94 94 91  Resp: 16 14 16  16  Temp:  98 F (36.7 C) 98.5 F (36.9 C) 98.5 F (36.9 C)  TempSrc:   Oral Oral  SpO2: 95% 96% 96% 96%  Weight:      Height:        Intake/Output Summary (Last 24 hours) at 11/18/2020 1714 Last data filed at 11/18/2020 1535 Gross per 24 hour  Intake --  Output 2200 ml  Net -2200 ml    No intake/output data recorded.  Filed Weights   11/17/20 1608  Weight: 57.3 kg    Physical Examination:    General-appears in no acute distress  Heart-S1-S2, regular, no murmur auscultated  HEENT-dried blood noted in the oral  cavity  Lungs-clear to auscultation bilaterally, no wheezing or crackles auscultated  Abdomen-soft, nontender, no organomegaly  Extremities-no edema in the lower extremities  Neuro-alert, oriented x3, no focal deficit noted   Status is: Inpatient  Dispo: The patient is from: Home              Anticipated d/c is to: Home              Anticipated d/c date is: 11/23/2020              Patient currently not medically stable for discharge  Barrier to discharge-odynophagia, dysphagia,?  Hematemesis       Data Reviewed:   Recent Results (from the past 240 hour(s))  Resp Panel by RT-PCR (Flu A&B, Covid) Nasopharyngeal Swab     Status: None   Collection Time: 11/17/20  9:10 PM   Specimen: Nasopharyngeal Swab; Nasopharyngeal(NP) swabs in vial transport medium  Result Value Ref Range Status   SARS Coronavirus 2 by RT PCR NEGATIVE NEGATIVE Final    Comment: (NOTE) SARS-CoV-2 target nucleic acids are NOT DETECTED.  The SARS-CoV-2 RNA is generally detectable in upper respiratory specimens during the acute phase of infection. The lowest concentration of SARS-CoV-2 viral copies this assay can detect is 138 copies/mL. A negative result does not preclude SARS-Cov-2 infection and should not be used as the sole basis for treatment or other patient management decisions. A negative result may occur with  improper specimen collection/handling, submission of specimen other than nasopharyngeal swab, presence of viral mutation(s) within the areas targeted by this assay, and inadequate number of viral copies(<138 copies/mL). A negative result must be combined with clinical observations, patient history, and epidemiological information. The expected result is Negative.  Fact Sheet for Patients:  EntrepreneurPulse.com.au  Fact Sheet for Healthcare Providers:  IncredibleEmployment.be  This test is no t yet approved or cleared by the [place] FDA and  has  been authorized for detection and/or diagnosis of SARS-CoV-2 by FDA under an Emergency Use Authorization (EUA). This EUA will remain  in effect (meaning this test can be used) for the duration of the COVID-19 declaration under Section 564(b)(1) of the Act, 21 U.S.C.section 360bbb-3(b)(1), unless the authorization is terminated  or revoked sooner.       Influenza A by PCR NEGATIVE NEGATIVE Final   Influenza B by PCR NEGATIVE NEGATIVE Final    Comment: (NOTE) The Xpert Xpress SARS-CoV-2/FLU/RSV plus assay is intended as an aid in the diagnosis of influenza from Nasopharyngeal swab specimens and should not be used as a sole basis for treatment. Nasal washings and aspirates are unacceptable for Xpert Xpress SARS-CoV-2/FLU/RSV testing.  Fact Sheet for Patients: EntrepreneurPulse.com.au  Fact Sheet for Healthcare Providers: IncredibleEmployment.be  This test is not yet approved or cleared by the [place] FDA and has been  authorized for detection and/or diagnosis of SARS-CoV-2 by FDA under an Emergency Use Authorization (EUA). This EUA will remain in effect (meaning this test can be used) for the duration of the COVID-19 declaration under Section 564(b)(1) of the Act, 21 U.S.C. section 360bbb-3(b)(1), unless the authorization is terminated or revoked.  Performed at [facility], [address], [place], [place] [address]      Studies:  CT Soft Tissue Neck W Contrast  Result Date: 11/17/2020 CLINICAL DATA:  Difficulty swallowing. EXAM: CT NECK WITH CONTRAST TECHNIQUE: Multidetector CT imaging of the neck was performed using the standard protocol following the bolus administration of intravenous contrast. CONTRAST:  52mL OMNIPAQUE IOHEXOL 300 MG/ML  SOLN COMPARISON:  10/31/2011 FINDINGS: PHARYNX AND LARYNX: There is mild edema of the epiglottis and supraglottic larynx. The airway is clearly patent. The nasopharynx and  oropharynx are normal. SALIVARY GLANDS: Absence or severe atrophy of the submandibular glands. Parotid glands are unremarkable. THYROID: Normal. LYMPH NODES: No enlarged or abnormal density lymph nodes. VASCULAR: Bilateral carotid stents. LIMITED INTRACRANIAL: Normal. VISUALIZED ORBITS: Normal. MASTOIDS AND VISUALIZED PARANASAL SINUSES: No fluid levels or advanced mucosal thickening. No mastoid effusion. SKELETON: No bony spinal canal stenosis. No lytic or blastic lesions. UPPER CHEST: Clear. OTHER: None. IMPRESSION: 1. Mild edema of the epiglottis and supraglottic larynx, which may be due to prior radiation therapy. Acute supraglottitis may also cause this appearance. The airway is clearly patent. 2. Post treatment changes in the neck without cervical lymphadenopathy. No visible pharyngeal mass. Aortic atherosclerosis (ICD10-I70.0). Electronically Signed   By: Deatra Robinson M.D.   On: 11/17/2020 20:29       Meredeth Ide   [facility] If 7PM-7AM, please contact night-coverage at [email or web address], Office  [phone]   11/18/2020, 5:14 PM  LOS: 1 day

## 2020-11-18 NOTE — Consult Note (Signed)
Reason for Consult: Dysphagia, hemoptysis Referring Physician: Veryl Speak, MD  HPI:  Christopher [name] is an 69 y.o. male well known to me. He has a history of right tonsillar cancer, s/p surgery, radiation, chemotherapy. He also has a history of chronic dysphagia and esophageal stricture, HIV, CAD, CKD stage III, insulin-dependent diabetes, HTN, HLD, hypothyroidism, CAS s/p bilateral revascularization/stenting, and depression/anxiety. He presents to the [facility] ED for evaluation of worsening odynophagia/dysphagia.  Patient states he has been having 4-5 weeks of progressive dysphagia/odynophagia.  He has been having a lot of oral pain and coughing up blood.  He denies any subjective fevers, chills, diaphoresis, nausea, vomiting, abdominal pain, dysuria, diarrhea.  He has been able to chew his medications and take sips of water but otherwise not able to maintain any adequate oral intake.  He has had approximately 14 pound weight loss over the last 2 weeks.    Past Medical History:  Diagnosis Date  . Anxiety   . Cataract   . Chronic kidney disease   . 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   . Diabetes mellitus without complication ([place])   . GERD (gastroesophageal reflux disease)   . Heart murmur    CHILDHOOD  . History of kidney stones   . HIV DISEASE 12/01/2006  . HYPERLIPIDEMIA, WITH LOW HDL 01/03/2008  . Hypertension   . HYPERTENSION 12/01/2006  . HYPOGONADISM 12/23/2009  . Hypothyroidism   . Neuromuscular disorder ([place])   . SYPHILIS, [place], LATENT NOS 01/18/2007  . Thyroid disease   . Tonsillar cancer ([place])    tonsillar ca   . Tuberculosis    history of TB about 30 years ago    Past Surgical History:  Procedure Laterality Date  . COLONOSCOPY    . CORONARY ANGIOGRAPHY N/A 05/05/2020    Procedure: CORONARY ANGIOGRAPHY;  Surgeon: Nigel Mormon, MD;  Location: [facility];  Service: Cardiovascular;  Laterality: N/A;  . TONSILECTOMY, ADENOIDECTOMY, BILATERAL MYRINGOTOMY AND TUBES     tonsil cancer   . TONSILLECTOMY    . TRANSCAROTID ARTERY REVASCULARIZATION Right 05/18/2020   Procedure: RIGHT TRANSCAROTID ARTERY REVASCULARIZATION;  Surgeon: Elam Dutch, MD;  Location: [facility];  Service: Vascular;  Laterality: Right;  . TRANSCAROTID ARTERY REVASCULARIZATION Left 06/22/2020   Procedure: LEFT TRANSCAROTID ARTERY REVASCULARIZATION;  Surgeon: Elam Dutch, MD;  Location: [facility];  Service: Vascular;  Laterality: Left;  . ULTRASOUND GUIDANCE FOR VASCULAR ACCESS Right 06/22/2020   Procedure: ULTRASOUND GUIDANCE FOR VASCULAR ACCESS;  Surgeon: Elam Dutch, MD;  Location: [facility] OR;  Service: Vascular;  Laterality: Right;  . UPPER GASTROINTESTINAL ENDOSCOPY      Family History  Problem Relation Age of Onset  . Diabetes Mother   . COPD Mother   . Heart attack Father   . Colon cancer Neg Hx   . Colon polyps Neg Hx   . Esophageal cancer Neg Hx   . Rectal cancer Neg Hx   . Stomach cancer Neg Hx     Social History:  reports that he has quit smoking. He has never used smokeless tobacco. He reports current alcohol use of about 2.0 standard drinks of alcohol per week. He reports that he does not use drugs.  Allergies:  Allergies  Allergen Reactions  . Codeine Other (See Comments)    "  makes me crazy"  . Sulfamethoxazole-Trimethoprim Itching and Other (See Comments)    "makes me crazy"  . Sulfonamide Derivatives Itching and Other (See Comments)    "makes me crazy"    Prior to Admission medications   Medication Sig Start Date End Date Taking? Authorizing Provider  aspirin EC 81 MG EC tablet Take 1 tablet (81 mg total) by mouth daily. 04/17/20  Yes Ghimire, Henreitta Leber, MD  benazepril (LOTENSIN) 10 MG tablet Take 10 mg by mouth daily.    Yes [provider]  clonazePAM (KLONOPIN) 1 MG tablet TAKE 1 TABLET 3 TIMES A DAY AS NEEDED FOR ANXIETY Patient taking differently: Take 1 mg by mouth 3 (three) times daily as needed for anxiety.   Yes Michel Bickers, MD  clopidogrel (PLAVIX) 75 MG tablet Take 1 tablet (75 mg total) by mouth daily. 04/17/20  Yes Ghimire, Henreitta Leber, MD  diphenhydrAMINE (BENADRYL) 25 MG tablet Take 50 mg by mouth at bedtime as needed for sleep.   Yes [provider]  dolutegravir (TIVICAY) 50 MG tablet Take 50 mg by mouth daily.  12/05/12 12/05/28 Yes Truman Hayward, MD  fenofibrate (TRICOR) 145 MG tablet TAKE ONE TABLET BY MOUTH DAILY Patient taking differently: Take 145 mg by mouth daily. 05/04/20  Yes Michel Bickers, MD  FLUoxetine (PROZAC) 20 MG capsule TAKE ONE CAPSULE BY MOUTH DAILY Patient taking differently: Take 20 mg by mouth daily. 07/06/20  Yes Michel Bickers, MD  folic acid (FOLVITE) 1 MG tablet Take 2 tablets (2 mg total) by mouth daily. 04/17/20  Yes Ghimire, Henreitta Leber, MD  HUMALOG KWIKPEN 100 UNIT/ML KwikPen Inject 1-15 Units into the skin 3 (three) times daily as needed (BS). Per sliding scale 04/16/20  Yes [provider]  insulin glargine (LANTUS) 100 UNIT/ML Solostar Pen Inject 26 Units into the skin daily at 10 pm. Patient taking differently: Inject 20 Units into the skin daily at 10 pm. 04/16/20  Yes Ghimire, Henreitta Leber, MD  levothyroxine (SYNTHROID) 75 MCG tablet Take 75 mcg by mouth daily before breakfast. 08/24/20  Yes [provider]  metoprolol succinate (TOPROL-XL) 25 MG 24 hr tablet Take 1 tablet (25 mg total) by mouth daily. Patient taking differently: Take 12.5 mg by mouth daily. 04/17/20  Yes Ghimire, Henreitta Leber, MD  rosuvastatin (CRESTOR) 20 MG tablet Take 1 tablet (20 mg total) by mouth daily. 04/17/20  Yes Ghimire, Henreitta Leber, MD  sertraline (ZOLOFT) 100 MG tablet Take 100 mg by mouth daily. 09/14/20  Yes [provider]  temazepam (RESTORIL) 30 MG capsule Take 30 mg by  mouth at bedtime. 07/26/16  Yes [provider]  [place] 600-50-300 MG tablet TAKE ONE TABLET BY MOUTH DAILY Patient taking differently: Take 1 tablet by mouth daily. 12/23/19  Yes Michel Bickers, MD  blood glucose meter kit and supplies Dispense based on patient and insurance preference. Use up to four times daily as directed. (FOR ICD-10 E10.9, E11.9). 04/16/20   Ghimire, Henreitta Leber, MD  CONTOUR NEXT TEST test strip  06/01/20   [provider]  Insulin Pen Needle 32G X 8 MM MISC Use as directed 04/16/20   Jonetta Osgood, MD  Dolutegravir Sodium (TIVICAY) 50 MG TABS Take 1 tablet (50 mg total) by mouth daily. 12/05/12 12/05/28  Truman Hayward, MD    Medications:  I have reviewed the patient's current medications. Scheduled: . insulin aspart  0-9 Units Subcutaneous Q4H   Continuous: . lactated ringers 100 mL/hr at  11/17/20 2319    Results for orders placed or performed during the hospital encounter of 11/17/20 (from the past 48 hour(s))  Lactic acid, plasma     Status: None   Collection Time: 11/17/20  4:18 PM  Result Value Ref Range   Lactic Acid, Venous 0.8 0.5 - 1.9 mmol/L    Comment: Performed at [facility], [place] [address], [place], [place] [address]  Comprehensive metabolic panel     Status: Abnormal   Collection Time: 11/17/20  4:18 PM  Result Value Ref Range   Sodium 139 135 - 145 mmol/L   Potassium 4.1 3.5 - 5.1 mmol/L   Chloride 100 98 - 111 mmol/L   CO2 25 22 - 32 mmol/L   Glucose, Bld 105 (H) 70 - 99 mg/dL    Comment: Glucose reference range applies only to samples taken after fasting for at least 8 hours.   BUN 48 (H) 8 - 23 mg/dL   Creatinine, Ser 1.77 (H) 0.61 - 1.24 mg/dL   Calcium 10.3 8.9 - 10.3 mg/dL   Total Protein 8.8 (H) 6.5 - 8.1 g/dL   Albumin 4.3 3.5 - 5.0 g/dL   AST 45 (H) 15 - 41 U/L   ALT 32 0 - 44 U/L   Alkaline Phosphatase 49 38 - 126 U/L   Total Bilirubin 1.4 (H) 0.3 - 1.2 mg/dL   GFR, Estimated 41 (L) >60  mL/min    Comment: (NOTE) Calculated using the CKD-EPI Creatinine Equation (2021)    Anion gap 14 5 - 15    Comment: Performed at [facility], [place] [address], [place], [place] [address]  CBC with Differential     Status: Abnormal   Collection Time: 11/17/20  4:18 PM  Result Value Ref Range   WBC 5.9 4.0 - 10.5 K/uL   RBC 3.80 (L) 4.22 - 5.81 MIL/uL   Hemoglobin 12.0 (L) 13.0 - 17.0 g/dL   HCT 38.3 (L) 39.0 - 52.0 %   MCV 100.8 (H) 80.0 - 100.0 fL   MCH 31.6 26.0 - 34.0 pg   MCHC 31.3 30.0 - 36.0 g/dL   RDW 14.8 11.5 - 15.5 %   Platelets 265 150 - 400 K/uL   nRBC 0.0 0.0 - 0.2 %   Neutrophils Relative % 75 %   Neutro Abs 4.5 1.7 - 7.7 K/uL   Lymphocytes Relative 15 %   Lymphs Abs 0.9 0.7 - 4.0 K/uL   Monocytes Relative 6 %   Monocytes Absolute 0.3 0.1 - 1.0 K/uL   Eosinophils Relative 2 %   Eosinophils Absolute 0.1 0.0 - 0.5 K/uL   Basophils Relative 1 %   Basophils Absolute 0.0 0.0 - 0.1 K/uL   Immature Granulocytes 1 %   Abs Immature Granulocytes 0.03 0.00 - 0.07 K/uL    Comment: Performed at [facility], [place] [address], [place], [place] [address]  Urinalysis, Routine w reflex microscopic     Status: Abnormal   Collection Time: 11/17/20  6:46 PM  Result Value Ref Range   Color, Urine YELLOW YELLOW   APPearance CLEAR CLEAR   Specific Gravity, Urine 1.017 1.005 - 1.030   pH 5.0 5.0 - 8.0   Glucose, UA NEGATIVE NEGATIVE mg/dL   Hgb urine dipstick MODERATE (A) NEGATIVE   Bilirubin Urine NEGATIVE NEGATIVE   Ketones, ur 20 (A) NEGATIVE mg/dL   Protein, ur 30 (A) NEGATIVE mg/dL   Nitrite NEGATIVE NEGATIVE   Leukocytes,Ua NEGATIVE NEGATIVE  RBC / HPF 0-5 0 - 5 RBC/hpf   WBC, UA 0-5 0 - 5 WBC/hpf   Bacteria, UA NONE SEEN NONE SEEN   Squamous Epithelial / LPF 0-5 0 - 5   Mucus PRESENT     Comment: Performed at [facility], [place] [address], [place], [place] [address]  Lactic acid, plasma     Status: None    Collection Time: 11/17/20  8:10 PM  Result Value Ref Range   Lactic Acid, Venous 0.5 0.5 - 1.9 mmol/L    Comment: Performed at [facility], [place] [address], [place], [place] [address]  Resp Panel by RT-PCR (Flu A&B, Covid) Nasopharyngeal Swab     Status: None   Collection Time: 11/17/20  9:10 PM   Specimen: Nasopharyngeal Swab; Nasopharyngeal(NP) swabs in vial transport medium  Result Value Ref Range   SARS Coronavirus 2 by RT PCR NEGATIVE NEGATIVE    Comment: (NOTE) SARS-CoV-2 target nucleic acids are NOT DETECTED.  The SARS-CoV-2 RNA is generally detectable in upper respiratory specimens during the acute phase of infection. The lowest concentration of SARS-CoV-2 viral copies this assay can detect is 138 copies/mL. A negative result does not preclude SARS-Cov-2 infection and should not be used as the sole basis for treatment or other patient management decisions. A negative result may occur with  improper specimen collection/handling, submission of specimen other than nasopharyngeal swab, presence of viral mutation(s) within the areas targeted by this assay, and inadequate number of viral copies(<138 copies/mL). A negative result must be combined with clinical observations, patient history, and epidemiological information. The expected result is Negative.  Fact Sheet for Patients:  EntrepreneurPulse.com.au  Fact Sheet for Healthcare Providers:  IncredibleEmployment.be  This test is no t yet approved or cleared by the [place] FDA and  has been authorized for detection and/or diagnosis of SARS-CoV-2 by FDA under an Emergency Use Authorization (EUA). This EUA will remain  in effect (meaning this test can be used) for the duration of the COVID-19 declaration under Section 564(b)(1) of the Act, 21 U.S.C.section 360bbb-3(b)(1), unless the authorization is terminated  or revoked sooner.       Influenza A by PCR NEGATIVE  NEGATIVE   Influenza B by PCR NEGATIVE NEGATIVE    Comment: (NOTE) The Xpert Xpress SARS-CoV-2/FLU/RSV plus assay is intended as an aid in the diagnosis of influenza from Nasopharyngeal swab specimens and should not be used as a sole basis for treatment. Nasal washings and aspirates are unacceptable for Xpert Xpress SARS-CoV-2/FLU/RSV testing.  Fact Sheet for Patients: EntrepreneurPulse.com.au  Fact Sheet for Healthcare Providers: IncredibleEmployment.be  This test is not yet approved or cleared by the [place] FDA and has been authorized for detection and/or diagnosis of SARS-CoV-2 by FDA under an Emergency Use Authorization (EUA). This EUA will remain in effect (meaning this test can be used) for the duration of the COVID-19 declaration under Section 564(b)(1) of the Act, 21 U.S.C. section 360bbb-3(b)(1), unless the authorization is terminated or revoked.  Performed at [facility], [place] [address] [place], [place] [address]   CBG monitoring, ED     Status: Abnormal   Collection Time: 11/17/20 11:14 PM  Result Value Ref Range   Glucose-Capillary 118 (H) 70 - 99 mg/dL    Comment: Glucose reference range applies only to samples taken after fasting for at least 8 hours.  CBG monitoring, ED     Status: None   Collection Time: 11/18/20  3:48 AM  Result  Value Ref Range   Glucose-Capillary 80 70 - 99 mg/dL    Comment: Glucose reference range applies only to samples taken after fasting for at least 8 hours.  Basic metabolic panel     Status: Abnormal   Collection Time: 11/18/20  5:45 AM  Result Value Ref Range   Sodium 141 135 - 145 mmol/L   Potassium 3.6 3.5 - 5.1 mmol/L   Chloride 101 98 - 111 mmol/L   CO2 26 22 - 32 mmol/L   Glucose, Bld 82 70 - 99 mg/dL    Comment: Glucose reference range applies only to samples taken after fasting for at least 8 hours.   BUN 34 (H) 8 - 23 mg/dL   Creatinine, Ser 1.19 0.61 - 1.24  mg/dL   Calcium 9.3 8.9 - 10.3 mg/dL   GFR, Estimated >60 >60 mL/min    Comment: (NOTE) Calculated using the CKD-EPI Creatinine Equation (2021)    Anion gap 14 5 - 15    Comment: Performed at [facility], [place] [address], [place], [place] [address]  CBC     Status: Abnormal   Collection Time: 11/18/20  5:45 AM  Result Value Ref Range   WBC 4.1 4.0 - 10.5 K/uL   RBC 3.40 (L) 4.22 - 5.81 MIL/uL   Hemoglobin 10.6 (L) 13.0 - 17.0 g/dL   HCT 33.6 (L) 39.0 - 52.0 %   MCV 98.8 80.0 - 100.0 fL   MCH 31.2 26.0 - 34.0 pg   MCHC 31.5 30.0 - 36.0 g/dL   RDW 14.8 11.5 - 15.5 %   Platelets 223 150 - 400 K/uL   nRBC 0.0 0.0 - 0.2 %    Comment: Performed at [facility], [place] [address] [place], [place] [address]  Magnesium     Status: None   Collection Time: 11/18/20  5:45 AM  Result Value Ref Range   Magnesium 1.8 1.7 - 2.4 mg/dL    Comment: Performed at [facility], [place] [address] [place], [place] [address]  Phosphorus     Status: Abnormal   Collection Time: 11/18/20  5:45 AM  Result Value Ref Range   Phosphorus 1.6 (L) 2.5 - 4.6 mg/dL    Comment: Performed at [facility], [place] [address], [place], [place] [address]  CBG monitoring, ED     Status: None   Collection Time: 11/18/20  7:18 AM  Result Value Ref Range   Glucose-Capillary 88 70 - 99 mg/dL    Comment: Glucose reference range applies only to samples taken after fasting for at least 8 hours.    CT Soft Tissue Neck W Contrast  Result Date: 11/17/2020 CLINICAL DATA:  Difficulty swallowing. EXAM: CT NECK WITH CONTRAST TECHNIQUE: Multidetector CT imaging of the neck was performed using the standard protocol following the bolus administration of intravenous contrast. CONTRAST:  19m OMNIPAQUE IOHEXOL 300 MG/ML  SOLN COMPARISON:  10/31/2011 FINDINGS: PHARYNX AND LARYNX: There is mild edema of the epiglottis and supraglottic larynx. The airway is clearly  patent. The nasopharynx and oropharynx are normal. SALIVARY GLANDS: Absence or severe atrophy of the submandibular glands. Parotid glands are unremarkable. THYROID: Normal. LYMPH NODES: No enlarged or abnormal density lymph nodes. VASCULAR: Bilateral carotid stents. LIMITED INTRACRANIAL: Normal. VISUALIZED ORBITS: Normal. MASTOIDS AND VISUALIZED PARANASAL SINUSES: No fluid levels or advanced mucosal thickening. No mastoid effusion. SKELETON: No bony spinal canal stenosis. No lytic or blastic lesions. UPPER CHEST: Clear. OTHER: None. IMPRESSION: 1. Mild edema  of the epiglottis and supraglottic larynx, which may be due to prior radiation therapy. Acute supraglottitis may also cause this appearance. The airway is clearly patent. 2. Post treatment changes in the neck without cervical lymphadenopathy. No visible pharyngeal mass. Aortic atherosclerosis (ICD10-I70.0). Electronically Signed   By: Ulyses Jarred M.D.   On: 11/17/2020 20:29   Review of Systems  Pertinent positives are documented above. All other systems reviewed and are negative.  Blood pressure (!) 161/97, pulse 94, temperature 97.6 F (36.4 C), temperature source Oral, resp. rate 16, height 5' 8.5" (1.74 m), weight 57.3 kg, SpO2 96 %. General appearance: alert, cooperative, cachectic and no distress Head: Normocephalic, without obvious abnormality, atraumatic Eyes: Pupils are equal, round, reactive to light. Extraocular motion is intact.  Ears: Examination of the ears shows normal auricles and external auditory canals bilaterally. Bilateral cerumen. Nose: Nasal examination shows normal mucosa, septum, turbinates.  Face: Facial examination shows no asymmetry. Palpation of the face elicit no significant tenderness.  Mouth: Oral cavity examination shows dry old blood. No mass or lesion. Neck: Significant post radiation changes. No palpable mass. The trachea is midline. The thyroid is not significantly enlarged.  Chest: Normal breath sound. No  distress.  Procedure:  Flexible Fiberoptic Laryngoscopy Anesthesia: None. Indication: Throat pain, dysphagia, hemoptysis. Description: Risks, benefits, and alternatives of flexible endoscopy were explained to the patient. Specific mention was made of the risk of throat numbness with difficulty swallowing, possible bleeding from the nose and mouth, and pain from the procedure.  The patient gave oral consent to proceed. The flexible scope was inserted into the right nasal cavity and advanced towards the nasopharynx.  Visualized mucosa over the turbinates and septum were normal.  The nasopharynx was clear.  Oropharyngeal walls were symmetric and mobile without lesion, mass. Dry blood noted throughout.  Hypopharynx was also without  lesion.  Larynx was mildly edematous.  No lesions or asymmetry in the supraglottic larynx.  Arytenoid mucosa was edematous.   True vocal folds were pale yellow and edematous but without mass or lesion.    Assessment/Plan: Progressive severe dysphagia with ? Hemoptysis or hematemesis. History of tonsillar cancer s/p chemoXRT. - No obvious bleeding source on today's laryngoscopy exam. - No cancer recurrence on CT scan. - May benefit for Upper GI scope. - Likely will need G-tube placement.   Aviyana Sonntag W Jaleel Allen 11/18/2020, 8:07 AM

## 2020-11-19 ENCOUNTER — Other Ambulatory Visit: Payer: Self-pay

## 2020-11-19 ENCOUNTER — Inpatient Hospital Stay (HOSPITAL_COMMUNITY): Payer: No Typology Code available for payment source | Admitting: Anesthesiology

## 2020-11-19 ENCOUNTER — Encounter (HOSPITAL_COMMUNITY): Payer: Self-pay | Admitting: Internal Medicine

## 2020-11-19 ENCOUNTER — Encounter (HOSPITAL_COMMUNITY)
Admission: EM | Disposition: A | Discharge status: Home / Self Care | Payer: Self-pay | Source: Home / Self Care | Attending: Family Medicine

## 2020-11-19 DIAGNOSIS — K222 Esophageal obstruction: Principal | ICD-10-CM

## 2020-11-19 DIAGNOSIS — N179 Acute kidney failure, unspecified: Secondary | ICD-10-CM

## 2020-11-19 DIAGNOSIS — Z85819 Personal history of malignant neoplasm of unspecified site of lip, oral cavity, and pharynx: Secondary | ICD-10-CM | POA: Diagnosis not present

## 2020-11-19 DIAGNOSIS — N189 Chronic kidney disease, unspecified: Secondary | ICD-10-CM

## 2020-11-19 DIAGNOSIS — B20 Human immunodeficiency virus [HIV] disease: Secondary | ICD-10-CM | POA: Diagnosis not present

## 2020-11-19 DIAGNOSIS — R1310 Dysphagia, unspecified: Secondary | ICD-10-CM | POA: Diagnosis not present

## 2020-11-19 HISTORY — PX: ESOPHAGOGASTRODUODENOSCOPY (EGD) WITH PROPOFOL: SHX5813

## 2020-11-19 LAB — CBC
HCT: 35.2 % — ABNORMAL LOW (ref 39.0–52.0)
Hemoglobin: 11.1 g/dL — ABNORMAL LOW (ref 13.0–17.0)
MCH: 31.4 pg (ref 26.0–34.0)
MCHC: 31.5 g/dL (ref 30.0–36.0)
MCV: 99.7 fL (ref 80.0–100.0)
Platelets: 214 10*3/uL (ref 150–400)
RBC: 3.53 MIL/uL — ABNORMAL LOW (ref 4.22–5.81)
RDW: 14.8 % (ref 11.5–15.5)
WBC: 4 10*3/uL (ref 4.0–10.5)
nRBC: 0 % (ref 0.0–0.2)

## 2020-11-19 LAB — BASIC METABOLIC PANEL
Anion gap: 15 (ref 5–15)
BUN: 25 mg/dL — ABNORMAL HIGH (ref 8–23)
CO2: 26 mmol/L (ref 22–32)
Calcium: 9.4 mg/dL (ref 8.9–10.3)
Chloride: 97 mmol/L — ABNORMAL LOW (ref 98–111)
Creatinine, Ser: 1.22 mg/dL (ref 0.61–1.24)
GFR, Estimated: >60 mL/min (ref >60)
Glucose, Bld: 116 mg/dL — ABNORMAL HIGH (ref 70–99)
Potassium: 3.3 mmol/L — ABNORMAL LOW (ref 3.5–5.1)
Sodium: 138 mmol/L (ref 135–145)

## 2020-11-19 LAB — HEPATIC FUNCTION PANEL
ALT: 29 U/L (ref 0–44)
AST: 44 U/L — ABNORMAL HIGH (ref 15–41)
Albumin: 3.8 g/dL (ref 3.5–5.0)
Alkaline Phosphatase: 41 U/L (ref 38–126)
Bilirubin, Direct: 0.4 mg/dL — ABNORMAL HIGH (ref 0.0–0.2)
Indirect Bilirubin: 1.2 mg/dL — ABNORMAL HIGH (ref 0.3–0.9)
Total Bilirubin: 1.6 mg/dL — ABNORMAL HIGH (ref 0.3–1.2)
Total Protein: 8.3 g/dL — ABNORMAL HIGH (ref 6.5–8.1)

## 2020-11-19 LAB — GLUCOSE, CAPILLARY
Glucose-Capillary: 101 mg/dL — ABNORMAL HIGH (ref 70–99)
Glucose-Capillary: 112 mg/dL — ABNORMAL HIGH (ref 70–99)
Glucose-Capillary: 115 mg/dL — ABNORMAL HIGH (ref 70–99)
Glucose-Capillary: 119 mg/dL — ABNORMAL HIGH (ref 70–99)
Glucose-Capillary: 129 mg/dL — ABNORMAL HIGH (ref 70–99)
Glucose-Capillary: 130 mg/dL — ABNORMAL HIGH (ref 70–99)

## 2020-11-19 LAB — PROTIME-INR
INR: 1.2 (ref 0.8–1.2)
Prothrombin Time: 14.3 seconds (ref 11.4–15.2)

## 2020-11-19 SURGERY — ESOPHAGOGASTRODUODENOSCOPY (EGD) WITH PROPOFOL
Anesthesia: Monitor Anesthesia Care

## 2020-11-19 MED ORDER — CLONAZEPAM 1 MG PO TABS
1.0000 mg | ORAL_TABLET | Freq: Three times a day (TID) | ORAL | Status: DC | PRN
  Administered 2020-11-19 – 2020-11-25 (×5): 1 mg via ORAL
  Filled 2020-11-19 (×5): qty 1

## 2020-11-19 MED ORDER — PROPOFOL 500 MG/50ML IV EMUL
INTRAVENOUS | Status: DC | PRN
  Administered 2020-11-19: 150 ug/kg/min via INTRAVENOUS

## 2020-11-19 MED ORDER — ABACAVIR-DOLUTEGRAVIR-LAMIVUD 600-50-300 MG PO TABS
1.0000 | ORAL_TABLET | Freq: Every day | ORAL | Status: DC
  Administered 2020-11-19 – 2020-11-27 (×8): 1 via ORAL
  Filled 2020-11-19 (×9): qty 1

## 2020-11-19 MED ORDER — METOPROLOL SUCCINATE ER 25 MG PO TB24
12.5000 mg | ORAL_TABLET | Freq: Every day | ORAL | Status: DC
  Administered 2020-11-19 – 2020-11-26 (×7): 12.5 mg via ORAL
  Filled 2020-11-19 (×8): qty 1

## 2020-11-19 MED ORDER — SODIUM CHLORIDE 0.9 % IV SOLN: INTRAVENOUS | Status: DC

## 2020-11-19 MED ORDER — DOLUTEGRAVIR SODIUM 50 MG PO TABS
50.0000 mg | ORAL_TABLET | Freq: Every day | ORAL | Status: DC
  Filled 2020-11-19: qty 1

## 2020-11-19 MED ORDER — LEVOTHYROXINE SODIUM 75 MCG PO TABS
75.0000 ug | ORAL_TABLET | Freq: Every day | ORAL | Status: DC
  Administered 2020-11-19 – 2020-11-27 (×7): 75 ug via ORAL
  Filled 2020-11-19 (×7): qty 1

## 2020-11-19 MED ORDER — PANTOPRAZOLE SODIUM 40 MG IV SOLR
40.0000 mg | Freq: Two times a day (BID) | INTRAVENOUS | Status: DC
  Administered 2020-11-19 – 2020-11-24 (×11): 40 mg via INTRAVENOUS
  Filled 2020-11-19 (×11): qty 40

## 2020-11-19 MED ORDER — MAGIC MOUTHWASH W/LIDOCAINE
10.0000 mL | Freq: Three times a day (TID) | ORAL | Status: DC
  Administered 2020-11-19 – 2020-11-27 (×27): 10 mL via ORAL
  Filled 2020-11-19 (×32): qty 10

## 2020-11-19 MED ORDER — LACTATED RINGERS IV SOLN: Freq: Once | INTRAVENOUS | Status: AC

## 2020-11-19 MED ORDER — PHENYLEPHRINE HCL (PRESSORS) 10 MG/ML IV SOLN
INTRAVENOUS | Status: DC | PRN
  Administered 2020-11-19: 80 ug via INTRAVENOUS

## 2020-11-19 MED ORDER — FLUOXETINE HCL 20 MG PO CAPS
20.0000 mg | ORAL_CAPSULE | Freq: Every day | ORAL | Status: DC
  Administered 2020-11-19 – 2020-11-27 (×8): 20 mg via ORAL
  Filled 2020-11-19 (×8): qty 1

## 2020-11-19 SURGICAL SUPPLY — 14 items

## 2020-11-19 NOTE — Op Note (Addendum)
[facility] Patient Name: Christopher [name] Procedure Date: 11/19/2020 MRN: [id number] Attending MD: Christopher [name] , MD Date of Birth: September 10, 1951 CSN: [id number] Age: 69 Admit Type: Inpatient Procedure:                Upper GI endoscopy Indications:              Dysphagia, Odynophagia Providers:                Christopher Mayer, MD, Christopher Daub, RN, Christopher [name]                            Christopher [name], Technician Referring MD:              Medicines:                Propofol per Anesthesia, Monitored Anesthesia Care Complications:            No immediate complications. Estimated Blood Loss:     Estimated blood loss: none. Procedure:                Pre-Anesthesia Assessment:                           - Prior to the procedure, a History and Physical                            was performed, and patient medications and                            allergies were reviewed. The patient's tolerance of                            previous anesthesia was also reviewed. The risks                            and benefits of the procedure and the sedation                            options and risks were discussed with the patient.                            All questions were answered, and informed consent                            was obtained. Prior Anticoagulants: The patient                            last took Plavix (clopidogrel) 2 days prior to the                            procedure. ASA Grade Assessment: III - A patient                            with severe systemic disease. After reviewing the  risks and benefits, the patient was deemed in                            satisfactory condition to undergo the procedure.                           After obtaining informed consent, the endoscope was                            passed under direct vision. Throughout the                            procedure, the patient's blood pressure, pulse, and                             oxygen saturations were monitored continuously. The                            [id number]) was introduced through the                            mouth, and advanced to the second part of duodenum.                            The upper GI endoscopy was accomplished without                            difficulty. The patient tolerated the procedure                            well. Scope In: Scope Out: Findings:      One benign-appearing, intrinsic mild stenosis was found in the proximal       esophagus. The stenosis was traversed.      Localized mucosal changes characterized by Inlet patch were found in the       proximal esophagus.      The Z-line was irregular.      The entire examined stomach was normal.      The cardia and gastric fundus were normal on retroflexion.      The examined duodenum was normal. Impression:               - Benign-appearing esophageal stenosis. Edema in                            upper esophagus from XRT - also has severe                            oropharyngeal mucositis - no other esophageal                            stricture/cause of sxs                           - Inlet patch mucosa in the esophagus.                           -  Z-line irregular.                           - Normal stomach.                           - Normal examined duodenum.                           - No specimens collected. Moderate Sedation:      Not Applicable - Patient had care per Anesthesia. Recommendation:           - Return patient to hospital ward for ongoing care.                           - Try Clear liquids                           magig mouthwash                           think gastrostomy makes sense but would wait til                            Plavix off x 5 days                           IR vs GI - will defer to [name] and Dr. Lyndel [name]                           Nutrition consult if not done                           I updated his husband Christopher [name] by phone - he  is in                            agreement with gastrostomy tube - they plan                            eventual dental extractions and dentures so added                            reason for gastrostomy tube Procedure Code(s):        --- Professional ---                           [phone], Esophagogastroduodenoscopy, flexible,                            transoral; diagnostic, including collection of                            specimen(s) by brushing or washing, when performed                            (separate procedure) Diagnosis Code(s):        ---  Professional ---                           K22.2, Esophageal obstruction                           K22.8, Other specified diseases of esophagus                           R13.10, Dysphagia, unspecified CPT copyright 2019 [employer]. All rights reserved. The codes documented in this report are preliminary and upon coder review may  be revised to meet current compliance requirements. Christopher Boop, MD 11/19/2020 10:45:32 AM This report has been signed electronically. Number of Addenda: 0

## 2020-11-19 NOTE — Anesthesia Preprocedure Evaluation (Signed)
Anesthesia Evaluation  Patient identified by MRN, date of birth, ID band Patient awake    Reviewed: Allergy & Precautions, NPO status , Patient's Chart, lab work & pertinent test results, reviewed documented beta blocker date and time   History of Anesthesia Complications Negative for: history of anesthetic complications  Airway Mallampati: II  TM Distance: >3 FB Neck ROM: Full    Dental   Pulmonary neg pulmonary ROS, former smoker,    Pulmonary exam normal        Cardiovascular hypertension, Pt. on medications and Pt. on home beta blockers + CAD  Normal cardiovascular exam     Neuro/Psych Anxiety Depression Carotid stenosis s/p bilateral stenting    GI/Hepatic Neg liver ROS, GERD  ,Chronic dysphagia Esophageal stricture   Endo/Other  diabetes, Insulin DependentHypothyroidism   Renal/GU Renal InsufficiencyRenal disease (CKDIII)  negative genitourinary   Musculoskeletal negative musculoskeletal ROS (+)   Abdominal   Peds  Hematology  (+) HIV, Plavix   Anesthesia Other Findings  H/o tonsillar cancer s/p excision, chemo, XRT  Reproductive/Obstetrics                            Anesthesia Physical Anesthesia Plan  ASA: III  Anesthesia Plan: MAC   Post-op Pain Management:    Induction: Intravenous  PONV Risk Score and Plan: 1 and Propofol infusion, TIVA and Treatment may vary due to age or medical condition  Airway Management Planned: Natural Airway, Nasal Cannula and Simple Face Mask  Additional Equipment: None  Intra-op Plan:   Post-operative Plan:   Informed Consent: I have reviewed the patients History and Physical, chart, labs and discussed the procedure including the risks, benefits and alternatives for the proposed anesthesia with the patient or authorized representative who has indicated his/her understanding and acceptance.       Plan Discussed with:   Anesthesia  Plan Comments:         Anesthesia Quick Evaluation

## 2020-11-19 NOTE — Progress Notes (Signed)
Subjective: No new c/o. Resting comfortably in bed. Completed EGD today.  Objective: Vital signs in last 24 hours: Temp:  [97.9 F (36.6 C)-98.7 F (37.1 C)] 98.2 F (36.8 C) (12/30 1403) Pulse Rate:  [72-94] 93 (12/30 1403) Resp:  [15-19] 17 (12/30 1403) BP: (88-158)/(54-96) 153/92 (12/30 1403) SpO2:  [95 %-100 %] 95 % (12/30 1403)  Physical Exam: General appearance: alert, cooperative, cachectic and no distress Head: Normocephalic, without obvious abnormality, atraumatic Eyes: Pupils are equal, round, reactive to light. Extraocular motion is intact.  Ears: Examination of the ears shows normal auricles and external auditory canals bilaterally. Bilateral cerumen. Nose: Nasal examination shows normal mucosa, septum, turbinates.  Face: Facial examination shows no asymmetry. Palpation of the face elicit no significant tenderness.  Mouth: Oral cavity examination shows dry old blood. No mass or lesion. Neck: Significant post radiation changes. No palpable mass. The trachea is midline. The thyroid is not significantly enlarged.  Chest: Normal breath sound. No distress.   Recent Labs    11/18/20 0545 11/19/20 0507  WBC 4.1 4.0  HGB 10.6* 11.1*  HCT 33.6* 35.2*  PLT 223 214   Recent Labs    11/18/20 0545 11/19/20 0507  NA 141 138  K 3.6 3.3*  CL 101 97*  CO2 26 26  GLUCOSE 82 116*  BUN 34* 25*  CREATININE 1.19 1.22  CALCIUM 9.3 9.4    Medications:  I have reviewed the patient's current medications. Scheduled:  abacavir-dolutegravir-lamiVUDine  1 tablet Oral Daily   FLUoxetine  20 mg Oral Daily   insulin aspart  0-9 Units Subcutaneous Q4H   levothyroxine  75 mcg Oral QAC breakfast   magic mouthwash w/lidocaine  10 mL Oral TID AC & HS   metoprolol succinate  12.5 mg Oral Daily   pantoprazole (PROTONIX) IV  40 mg Intravenous Q12H   Continuous:  sodium chloride 1,000 mL (11/18/20 1707)   dextrose 5 % and 0.45% NaCl 75 mL/hr at 11/19/20 1237     Assessment/Plan: Progressive severe dysphagia. History of tonsillar cancer s/p chemoXRT. - EGD today showed severe oropharyngeal mucositis and esophageal stenosis. - No cancer recurrence on CT scan. - Likely will need G-tube placement. - No ENT intervention at this time.   LOS: 2 days   Hamilton Marinello W Monia Timmers 11/19/2020, 5:40 PM

## 2020-11-19 NOTE — Progress Notes (Signed)
[facility]  PROGRESS NOTE  Christopher [name] [id number] DOB: 03/18/1951 DOA: 11/17/2020 PCP: Fanny Bien, MD   Brief HPI:   69 year old male with history of right tonsillar cancer, status post surgery, radiation, chemotherapy also has history of chronic dysphagia, esophageal stricture, HIV, CAD, CKD stage III, diabetes mellitus type 2, hypertension, hyperlipidemia, hypothyroidism, carotid artery stenosis s/p bilateral revascularization/stenting, depression/anxiety came to ED with worsening odynophagia, dysphagia also coughing up blood.    Subjective   Patient seen and examined, went for EGD this morning.   Assessment/Plan:     1. Hemoptysis versus hematemesis-patient  has history of tonsillar cancer s/p chemo XRT.  He was evaluated by ENT, no obvious source of bleeding noted on bronchoscopy.  Recommend GI evaluation.  GI was consulted, patient underwent EGD which only showed oropharyngeal mucositis and stable esophageal stenosis.  GI recommends PEG tube placement.  Patient has been off Plavix since December 26, discussed Dr. Carlean Purl.  He will discuss with Dr. Lyndel Safe regarding timing of PEG tube placement.  Continue to hold Plavix at this time. 2. Acute kidney injury on CKD stage IIIa-likely from dehydration, poor p.o. intake.  Resolved after IV hydration. 3. HIV-we will resume home meds. 4. Diabetes mellitus type 2-continue sliding scale insulin with NovoLog, check CBG every 4 hours.  We will start back on clear liquid diet.  Patient is also on D5 half-normal saline for hypoglycemia.  We will continue IV fluids for now and check his blood sugar. 5. Hypertension-benazepril on hold given AKI. 6. Hypothyroidism-resume Synthroid. 7. CAD-no chest pain, aspirin/Plavix/statin on hold for now. 8. Carotid artery stenosis s/p bilateral revascularization/stent placement-underwent right-sided revascularization on 05/10/2020 and left-sided on 8-21 by Dr. Oneida Alar.  He is on aspirin/Plavix  indefinitely however these medications on hold due to ongoing hemoptysis/hematemesis.  These medications are currently on hold for PEG tube placement.  The benefit of holding these medications outweigh the risks at this time.      COVID-19 Labs  No results for input(s): DDIMER, FERRITIN, LDH, CRP in the last 72 hours.  Lab Results  Component Value Date   SARSCOV2NAA NEGATIVE 11/17/2020   [place] NEGATIVE 06/19/2020   [place] NEGATIVE 05/14/2020   [place] NEGATIVE 05/01/2020     Scheduled medications:   . insulin aspart  0-9 Units Subcutaneous Q4H  . magic mouthwash w/lidocaine  10 mL Oral TID AC & HS  . pantoprazole (PROTONIX) IV  40 mg Intravenous Q12H         CBG: Recent Labs  Lab 11/18/20 2051 11/19/20 0019 11/19/20 0435 11/19/20 0735 11/19/20 1137  GLUCAP 98 101* 112* 119* 129*    SpO2: 95 % O2 Flow Rate (L/min): 5 L/min    CBC: Recent Labs  Lab 11/17/20 1618 11/18/20 0545 11/19/20 0507  WBC 5.9 4.1 4.0  NEUTROABS 4.5  --   --   HGB 12.0* 10.6* 11.1*  HCT 38.3* 33.6* 35.2*  MCV 100.8* 98.8 99.7  PLT 265 223 [id number]    Basic Metabolic Panel: Recent Labs  Lab 11/17/20 1618 11/18/20 0545 11/19/20 0507  NA 139 141 138  K 4.1 3.6 3.3*  CL 100 101 97*  CO2 25 26 26   GLUCOSE 105* 82 116*  BUN 48* 34* 25*  CREATININE 1.77* 1.19 1.22  CALCIUM 10.3 9.3 9.4  MG  --  1.8  --   PHOS  --  1.6*  --      Liver Function Tests: Recent Labs  Lab 11/17/20 1618 11/19/20 0507  AST  45* 44*  ALT 32 29  ALKPHOS 49 41  BILITOT 1.4* 1.6*  PROT 8.8* 8.3*  ALBUMIN 4.3 3.8     Antibiotics: Anti-infectives (From admission, onward)   None       DVT prophylaxis: SCDs  Code Status: DNR  Family Communication: No family at bedside   Consultants:  ENT  Procedures:      Objective   Vitals:   11/19/20 1036 11/19/20 1040 11/19/20 1050 11/19/20 1403  BP: 131/77 122/71 (!) 158/93 (!) 153/92  Pulse:  84 94 93  Resp:  18 15 17    Temp:    98.2 F (36.8 C)  TempSrc:    Oral  SpO2:  98% 98% 95%  Weight:      Height:        Intake/Output Summary (Last 24 hours) at 11/19/2020 1408 Last data filed at 11/19/2020 1021 Gross per 24 hour  Intake 1377.86 ml  Output 2350 ml  Net -972.14 ml    12/28 1901 - 12/30 0700 In: 877.9 [I.V.:877.9] Out: [id number] [Urine:3150]  Filed Weights   11/17/20 1608  Weight: 57.3 kg    Physical Examination:    General-appears in no acute distress  Heart-S1-S2, regular, no murmur auscultated  Lungs-clear to auscultation bilaterally, no wheezing or crackles auscultated  Abdomen-soft, nontender, no organomegaly  Extremities-no edema in the lower extremities  Neuro-alert, oriented x3, no focal deficit noted   Status is: Inpatient  Dispo: The patient is from: Home              Anticipated d/c is to: Home              Anticipated d/c date is: 11/23/2020              Patient currently not medically stable for discharge  Barrier to discharge-odynophagia, dysphagia,?  Hematemesis       Data Reviewed:   Recent Results (from the past 240 hour(s))  Resp Panel by RT-PCR (Flu A&B, Covid) Nasopharyngeal Swab     Status: None   Collection Time: 11/17/20  9:10 PM   Specimen: Nasopharyngeal Swab; Nasopharyngeal(NP) swabs in vial transport medium  Result Value Ref Range Status   SARS Coronavirus 2 by RT PCR NEGATIVE NEGATIVE Final    Comment: (NOTE) SARS-CoV-2 target nucleic acids are NOT DETECTED.  The SARS-CoV-2 RNA is generally detectable in upper respiratory specimens during the acute phase of infection. The lowest concentration of SARS-CoV-2 viral copies this assay can detect is 138 copies/mL. A negative result does not preclude SARS-Cov-2 infection and should not be used as the sole basis for treatment or other patient management decisions. A negative result may occur with  improper specimen collection/handling, submission of specimen other than nasopharyngeal swab,  presence of viral mutation(s) within the areas targeted by this assay, and inadequate number of viral copies(<138 copies/mL). A negative result must be combined with clinical observations, patient history, and epidemiological information. The expected result is Negative.  Fact Sheet for Patients:  EntrepreneurPulse.com.au  Fact Sheet for Healthcare Providers:  IncredibleEmployment.be  This test is no t yet approved or cleared by the [place] FDA and  has been authorized for detection and/or diagnosis of SARS-CoV-2 by FDA under an Emergency Use Authorization (EUA). This EUA will remain  in effect (meaning this test can be used) for the duration of the COVID-19 declaration under Section 564(b)(1) of the Act, 21 U.S.C.section 360bbb-3(b)(1), unless the authorization is terminated  or revoked sooner.  Influenza A by PCR NEGATIVE NEGATIVE Final   Influenza B by PCR NEGATIVE NEGATIVE Final    Comment: (NOTE) The Xpert Xpress SARS-CoV-2/FLU/RSV plus assay is intended as an aid in the diagnosis of influenza from Nasopharyngeal swab specimens and should not be used as a sole basis for treatment. Nasal washings and aspirates are unacceptable for Xpert Xpress SARS-CoV-2/FLU/RSV testing.  Fact Sheet for Patients: BloggerCourse.com  Fact Sheet for Healthcare Providers: SeriousBroker.it  This test is not yet approved or cleared by the [place] FDA and has been authorized for detection and/or diagnosis of SARS-CoV-2 by FDA under an Emergency Use Authorization (EUA). This EUA will remain in effect (meaning this test can be used) for the duration of the COVID-19 declaration under Section 564(b)(1) of the Act, 21 U.S.C. section 360bbb-3(b)(1), unless the authorization is terminated or revoked.  Performed at [facility], [address] [place], [place] [address]       Studies:  CT Soft Tissue Neck W Contrast  Result Date: 11/17/2020 CLINICAL DATA:  Difficulty swallowing. EXAM: CT NECK WITH CONTRAST TECHNIQUE: Multidetector CT imaging of the neck was performed using the standard protocol following the bolus administration of intravenous contrast. CONTRAST:  54mL OMNIPAQUE IOHEXOL 300 MG/ML  SOLN COMPARISON:  10/31/2011 FINDINGS: PHARYNX AND LARYNX: There is mild edema of the epiglottis and supraglottic larynx. The airway is clearly patent. The nasopharynx and oropharynx are normal. SALIVARY GLANDS: Absence or severe atrophy of the submandibular glands. Parotid glands are unremarkable. THYROID: Normal. LYMPH NODES: No enlarged or abnormal density lymph nodes. VASCULAR: Bilateral carotid stents. LIMITED INTRACRANIAL: Normal. VISUALIZED ORBITS: Normal. MASTOIDS AND VISUALIZED PARANASAL SINUSES: No fluid levels or advanced mucosal thickening. No mastoid effusion. SKELETON: No bony spinal canal stenosis. No lytic or blastic lesions. UPPER CHEST: Clear. OTHER: None. IMPRESSION: 1. Mild edema of the epiglottis and supraglottic larynx, which may be due to prior radiation therapy. Acute supraglottitis may also cause this appearance. The airway is clearly patent. 2. Post treatment changes in the neck without cervical lymphadenopathy. No visible pharyngeal mass. Aortic atherosclerosis (ICD10-I70.0). Electronically Signed   By: Deatra Robinson M.D.   On: 11/17/2020 20:29       Meredeth Ide   [facility] If 7PM-7AM, please contact night-coverage at [email or web address], Office  [phone]   11/19/2020, 2:08 PM  LOS: 2 days

## 2020-11-19 NOTE — Progress Notes (Signed)
Initial Nutrition Assessment -Late Entry  DOCUMENTATION CODES:   Severe malnutrition in context of chronic illness  INTERVENTION:   Monitor magnesium, potassium, and phosphorus daily for at least 3 days, MD to replete as needed, as pt is at risk for refeeding syndrome.  TF recommendations: -Please consult RD for TF initiation and management -Will order Mg and Phos refeeding labs via TF protocol -discussed with MD -Initiate Osmolite 1.5 @ 20 ml/hr, advance by 10 ml every 12 hours to goal rate of 65 ml/hr.  Bolus feed recommendations for discharge: -237 ml Osmolite 1.5 6 times daily -Free water flush of 60 ml before and after each bolus (720 ml) -This provides 2130 kcals, 89g protein and  1806 ml H2O  NUTRITION DIAGNOSIS:   Severe Malnutrition related to chronic illness,dysphagia (HIV) as evidenced by percent weight loss,energy intake < or equal to 75% for > or equal to 1 month,severe fat depletion,severe muscle depletion.  GOAL:   Patient will meet greater than or equal to 90% of their needs  MONITOR:   Weight trends,Labs,I & O's,TF tolerance  REASON FOR ASSESSMENT:   Malnutrition Screening Tool    ASSESSMENT:   69 year old male with history of right tonsillar cancer, status post surgery, radiation, chemotherapy also has history of chronic dysphagia, esophageal stricture, HIV, CAD, CKD stage III, diabetes mellitus type 2, hypertension, hyperlipidemia, hypothyroidism, carotid artery stenosis s/p bilateral revascularization/stenting, depression/anxiety came to ED with worsening odynophagia, dysphagia also coughing up blood.  Patient in room, reports not eating d/t inability to swallow for the last 2 weeks. Pt subsisting mainly on water and 4 oz of Ensure. Pt has had chronic dysphagia since receiving radiation treatments for tonsillar cancer 10 years ago.   Pt underwent EGD today which showed mucositis and esophageal stenosis. GI recommending PEG tube once he's been off  Plavix for 5 days. Pt reported to MD at bedside his last dose was Sunday 10/26.   Discussed pt's refeeding syndrome risk with Dr. Sharl Ma, will order Mg and Phos labs once tube feeding ready to begin.  TF recommendations above.  Per weight records, pt has lost 13 lbs. Pt reports losing 14 lbs over the last 2 weeks (9% wt loss x 2 weeks, significant for time frame).   Medications: Magic Mouthwash, D5 infusion, Lactated ringers   Labs reviewed: CBGs: 115-130 Low K  NUTRITION - FOCUSED PHYSICAL EXAM:  Flowsheet Row Most Recent Value  Orbital Region Severe depletion  Upper Arm Region Severe depletion  Thoracic and Lumbar Region Severe depletion  Buccal Region Severe depletion  Temple Region Severe depletion  Clavicle Bone Region Severe depletion  Clavicle and Acromion Bone Region Severe depletion  Scapular Bone Region Severe depletion  Dorsal Hand Severe depletion  Patellar Region Unable to assess  Anterior Thigh Region Unable to assess  Posterior Calf Region Unable to assess  Edema (RD Assessment) None       Diet Order:   Diet Order            Diet clear liquid Room service appropriate? Yes; Fluid consistency: Thin  Diet effective now                 EDUCATION NEEDS:   Education needs have been addressed  Skin:  Skin Assessment: Reviewed RN Assessment  Last BM:  12/30  Height:   Ht Readings from Last 1 Encounters:  11/17/20 5' 8.5" (1.74 m)    Weight:   Wt Readings from Last 1 Encounters:  11/17/20 57.3 kg  BMI:  Body mass index is 18.94 kg/m.  Estimated Nutritional Needs:   Kcal:  2000-2200  Protein:  95-110g  Fluid:  2L/day   Clayton Bibles, MS, RD, LDN Inpatient Clinical Dietitian Contact information available via Amion

## 2020-11-19 NOTE — Consult Note (Addendum)
Referring Provider: No ref. provider found Primary Care Physician:  Fanny Bien, MD Primary Gastroenterologist:  Dr.  Luiz Iron for Consultation:  Odynophagia, dysphagia   HPI: Christopher [name] is a 69 y.o. 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 He was seen in our office by Alonza Bogus 07/15/2020 due to having dysphagia symptoms and constipation.  At that time, he was assessed to have oral thrush at that time and he was prescribed a course of Diflucan x 14 days. An EGD was deferred due to his recent carotid artery stents requiring Plavix without interruption.   I saw him in our GI clinic on 11/17/2020 with odynophagia and worsening dysphagia. He was unable to swallow solid food for the past 2 weeks. He reported having pain to his throat area when he swallows. He intermittently gags if food gets stuck to his upper esophagus and vomits out the stuck food which occurs chronically once weekly.  He was tolerating sips of water and 4 ounces of Ensure daily. Loss of voice has progressively worsened over the past few days. He was able to swallow his saliva and secretions, no drooling. He initially reported spitting up blood then started to cough up blood.  No hematemesis. He  lost 14lbs over the past 2 weeks. No recent antibiotics or steroid use. No fevers. His most recent EGD was 07/03/2017 which showed a benign appearing esophageal stenosis which was dilated, a small hiatal hernia and biopsies showed evidence of acid reflux without Barrett's esophagus. No H. Pylori. He takes ASA 46m daily. He took Ibuprofen 2029mpo QD for the past 4 days.  He is on Plavix. On exam in the clinic, he had a moderate amount of dark blood to the posterior  pharynx with brown tissue to the right upper palate with abnormal oropharynx anatomy secondary to past tonsillar mass surgery and chemotherapy. His exam was concerning for a possible oral abscess. [facility], eventual EGD and possible PEG placement.   In the ED, labs showed WBC 5.9. Hg 12.0. HCT 38.3. MCV 100.8. PLT 265. Na 130. K 4.1. BUN 48. Cr. 1.77. Albumin 4.3. Alk phos 49. AST 45. ALT 32. T. Bili 1.4. SARS Coronavirus 2 negative. A neck/soft tissue CT identified mild edema of the epiglottis and supraglottic larynx, which may be due to prior radiation therapy verses acute supraglottitis. The airway was patent and post treatment changes in the neck without cervical lymphadenopathy was noted. No visible pharyngeal mass. He underwent a laryngoscopy by ENT Dr. TeBenjamine Mola1/29 which showed dry blood noted throughout the oral cavity and nasopharynx without evidence of a mass or abscess. The larynx and vocal cords were mildly edematous. No gross hemoptysis since admission. A chest CT has not been done at this juncture. A GI consult was requested for endoscopic evaluation and possible PEG placement. Last dose of ASA and Plavix were on 11/15/2020.  Currently, he remains NPO. He continues to spit and cough up small amounts of blood. No CP or SOB. No abdominal pain. No BM since admission. He reports passing a normal brown formed stool most days. No rectal bleeding or melena. He underwent a colonoscopy 20 years ago which  he reported was normal. No family history of esophageal, gastric or colon cancer.   Neck CT with contrast: 1. Mild edema of the epiglottis and supraglottic larynx, which may be due to prior radiation therapy. Acute supraglottitis may also cause this appearance. The airway is clearly patent. 2. Post treatment changes in the neck without cervical lymphadenopathy. No visible pharyngeal mass. Aortic atherosclerosis   Flexible fiberoptic laryngoscopy  11/18/2020: The flexible scope was inserted into the right nasal cavity and advanced towards the nasopharynx.  Visualized mucosa over the turbinates and septum were normal.  The nasopharynx was clear.  Oropharyngeal walls were symmetric and mobile without lesion, mass. Dry blood noted throughout.  Hypopharynx was also without  lesion.  Larynx was mildly edematous.  No lesions or asymmetry in the supraglottic larynx.  Arytenoid mucosa was edematous.   True vocal folds were pale yellow and edematous but without mass or lesion.    EGD 07/03/2017: - Small hiatal hernia. - Salmon-colored mucosa suspicious for short-segment Barrett's esophagus. Biopsied. - Benign-appearing esophageal stenosis. Dilated. - Normal stomach. - Normal examined duodenum. Surgical [P], GE junction - GASTROESOPHAGEAL JUNCTION MUCOSA WITH INFLAMMATION CONSISTENT WITH REFLUX. - WARTHIN-STARRY STAIN NEGATIVE FOR HELICOBACTER PYLORI. - NO INTESTINAL METAPLASIA, DYSPLASIA OR MALIGNANCY.  Past Medical History:  Diagnosis Date  . Anxiety   . Cataract   . Chronic kidney disease   . 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   . Diabetes mellitus without complication ([place])   . GERD (gastroesophageal reflux disease)   . Heart murmur    CHILDHOOD  . History of kidney stones   . HIV DISEASE 12/01/2006  . HYPERLIPIDEMIA, WITH LOW HDL 01/03/2008  . Hypertension   . HYPERTENSION 12/01/2006  . HYPOGONADISM 12/23/2009  . Hypothyroidism   . Neuromuscular disorder ([place])   . SYPHILIS, [place], LATENT NOS 01/18/2007  . Thyroid disease   . Tonsillar cancer ([place])    tonsillar ca   . Tuberculosis    history of TB about 30 years ago    Past Surgical History:  Procedure Laterality Date  . COLONOSCOPY    . CORONARY ANGIOGRAPHY N/A 05/05/2020   Procedure:  CORONARY ANGIOGRAPHY;  Surgeon: Nigel Mormon, MD;  Location: [facility];  Service: Cardiovascular;  Laterality: N/A;  . TONSILECTOMY, ADENOIDECTOMY, BILATERAL MYRINGOTOMY AND TUBES     tonsil cancer   . TONSILLECTOMY    . TRANSCAROTID ARTERY REVASCULARIZATION Right 05/18/2020   Procedure: RIGHT TRANSCAROTID ARTERY REVASCULARIZATION;  Surgeon: Elam Dutch, MD;  Location: [facility];  Service: Vascular;  Laterality: Right;  . TRANSCAROTID ARTERY REVASCULARIZATION Left 06/22/2020   Procedure: LEFT TRANSCAROTID ARTERY REVASCULARIZATION;  Surgeon: Elam Dutch, MD;  Location: [facility];  Service: Vascular;  Laterality: Left;  . ULTRASOUND GUIDANCE FOR VASCULAR ACCESS Right 06/22/2020   Procedure: ULTRASOUND GUIDANCE FOR VASCULAR ACCESS;  Surgeon: Elam Dutch, MD;  Location: [facility];  Service: Vascular;  Laterality: Right;  . UPPER GASTROINTESTINAL ENDOSCOPY      Prior to Admission medications   Medication Sig Start Date End Date Taking? Authorizing Provider  aspirin EC 81 MG EC tablet Take 1 tablet (81 mg total) by mouth daily. 04/17/20  Yes Ghimire, Henreitta Leber, MD  benazepril (LOTENSIN) 10 MG tablet Take 10 mg by mouth daily.    Yes [provider]  clonazePAM (KLONOPIN) 1 MG tablet TAKE 1 TABLET 3 TIMES A DAY AS NEEDED FOR ANXIETY Patient taking differently: Take 1 mg by mouth 3 (three) times daily as needed for anxiety.   Yes Michel Bickers, MD  clopidogrel (PLAVIX) 75 MG tablet Take 1 tablet (75 mg total) by mouth daily. 04/17/20  Yes Ghimire, Henreitta Leber, MD  diphenhydrAMINE (BENADRYL) 25 MG tablet Take 50 mg by mouth at bedtime as needed for sleep.   Yes [provider]  dolutegravir (TIVICAY) 50 MG tablet Take 50 mg by mouth daily.  12/05/12 12/05/28 Yes Truman Hayward, MD  fenofibrate (TRICOR) 145 MG tablet TAKE ONE TABLET BY MOUTH DAILY Patient taking differently: Take 145 mg by mouth daily. 05/04/20  Yes Michel Bickers, MD  FLUoxetine (PROZAC) 20 MG  capsule TAKE ONE CAPSULE BY MOUTH DAILY Patient taking differently: Take 20 mg by mouth daily. 07/06/20  Yes Michel Bickers, MD  folic acid (FOLVITE) 1 MG tablet Take 2 tablets (2 mg total) by mouth daily. 04/17/20  Yes Ghimire, Henreitta Leber, MD  HUMALOG KWIKPEN 100 UNIT/ML KwikPen Inject 1-15 Units into the skin 3 (three) times daily as needed (BS). Per sliding scale 04/16/20  Yes [provider]  insulin glargine (LANTUS) 100 UNIT/ML Solostar Pen Inject 26 Units into the skin daily at 10 pm. Patient taking differently: Inject 20 Units into the skin daily at 10 pm. 04/16/20  Yes Ghimire, Henreitta Leber, MD  levothyroxine (SYNTHROID) 75 MCG tablet Take 75 mcg by mouth daily before breakfast. 08/24/20  Yes [provider]  metoprolol succinate (TOPROL-XL) 25 MG 24 hr tablet Take 1 tablet (25 mg total) by mouth daily. Patient taking differently: Take 12.5 mg by mouth daily. 04/17/20  Yes Ghimire, Henreitta Leber, MD  rosuvastatin (CRESTOR) 20 MG tablet Take 1 tablet (20 mg total) by mouth daily. 04/17/20  Yes Ghimire, Henreitta Leber, MD  sertraline (ZOLOFT) 100 MG tablet Take 100 mg by mouth daily. 09/14/20  Yes [provider]  temazepam (RESTORIL) 30 MG capsule Take 30 mg by mouth at bedtime. 07/26/16  Yes [provider]  [place] 600-50-300 MG tablet TAKE ONE TABLET BY MOUTH DAILY Patient taking differently: Take 1 tablet by mouth daily. 12/23/19  Yes Michel Bickers, MD  blood glucose meter kit and supplies Dispense based on patient and insurance preference. Use up to four times daily as directed. (FOR ICD-10 E10.9, E11.9). 04/16/20   Ghimire, Henreitta Leber, MD  CONTOUR NEXT TEST test strip  06/01/20   [provider]  Insulin Pen Needle 32G X 8 MM MISC Use as directed 04/16/20   Jonetta Osgood, MD  Dolutegravir Sodium (TIVICAY) 50 MG TABS Take 1 tablet (50 mg total) by mouth daily. 12/05/12 12/05/28  Truman Hayward, MD    Current Facility-Administered Medications  Medication  Dose Route Frequency Provider Last Rate Last Admin  . 0.9 %  sodium chloride infusion   Intravenous PRN Oswald Hillock, MD 10 mL/hr at 11/18/20 1707 1,000 mL at 11/18/20 1707  . dextrose 5 %-0.45 % sodium chloride infusion   Intravenous Continuous Oswald Hillock, MD 75 mL/hr at 11/18/20 1828 New Bag at 11/18/20 1828  . insulin aspart (novoLOG) injection 0-9 Units  0-9 Units Subcutaneous Q4H Patel, Vishal R, MD      . morphine 2 MG/ML injection 1 mg  1 mg Intravenous Q4H PRN Lenore Cordia, MD   1 mg at 11/18/20 1744  . ondansetron (ZOFRAN) tablet 4 mg  4 mg Oral Q6H PRN Lenore Cordia, MD       Or  . ondansetron [facility]) injection 4 mg  4 mg Intravenous Q6H PRN Lenore Cordia, MD        Allergies as of 11/17/2020 - Review Complete 11/17/2020  Allergen Reaction Noted  . Codeine Other (See Comments) 08/11/2011  . Sulfamethoxazole-trimethoprim Itching and Other (See Comments)   . Sulfonamide derivatives Itching and Other (See Comments)     Family History  Problem Relation Age of Onset  . Diabetes Mother   . COPD Mother   . Heart attack Father   . Colon cancer Neg Hx   . Colon polyps Neg Hx   . Esophageal cancer Neg Hx   . Rectal cancer Neg Hx   . Stomach cancer Neg Hx     Social History   Socioeconomic History  . Marital status: Married    Spouse name: Not on file  . Number of children: 0  . Years of education: Not on file  . Highest education level: Not on file  Occupational History  . Occupation: retired  Tobacco Use  . Smoking status: Former Research scientist (life sciences)  . Smokeless tobacco: Never Used  . Tobacco comment: quit when he was 69 years old.  Vaping Use  . Vaping Use: Never used  Substance and Sexual Activity  . Alcohol use: Yes    Alcohol/week: 2.0 standard drinks    Types: 2 Shots of liquor per week    Comment: "COUPLE TIMES A WEEK"  . Drug use: No    Comment: 40 years ago used multiple drugs  . Sexual activity: Yes    Comment: declined condoms  Other Topics Concern  .  Not on file  Social History Narrative   Lives with husband   Social Determinants of Health   Financial Resource Strain: Not on file  Food Insecurity: Not on file  Transportation Needs: Not on file  Physical Activity: Not on file  Stress: Not on file  Social Connections: Not on file  Intimate Partner Violence: Not on file    Review of Systems: Gen: See HPI.  CV: Denies chest pain, palpitations or edema. Resp: See HPI.   GI: See HPI. GU : Denies urinary burning or hematuria.  MS: Denies joint pain, muscles aches or weakness. Derm: Denies rash, itchiness, skin lesions or unhealing ulcers. Psych: Denies depression, anxiety or memory loss. Heme: Denies easy bruising, bleeding. Neuro:  Denies headaches, dizziness or paresthesias. Endo:  + DM.  Physical Exam: Vital signs in last 24 hours: Temp:  [97.9 F (36.6 C)-98.7 F (37.1 C)] 97.9 F (36.6 C) (12/30 0600) Pulse Rate:  [91-103] 91 (12/30 0600) Resp:  [14-21] 16 (12/30 0600) BP: (114-172)/(83-107) 157/96 (12/30 0600) SpO2:  [95 %-97 %] 97 % (12/30 0600) Last BM Date: 11/17/20 General:  Alert cachectic 69 year old male in NAD.  Head:  Normocephalic and atraumatic. Eyes:  No scleral icterus. Conjunctiva pink. Ears:  Normal auditory acuity. Nose:  No deformity, discharge or lesions. Mouth:  Extremely poor dentition, upper right teeth are brown, posterior pharynx with dried blood, right posterior pharynx with yellow/brown tissue ? secondary to radiation tx, tongue with dark brown patches.  Neck:  Supple. No lymphadenopathy or thyromegaly.  Lungs:  Breath sounds clear throughout.  Heart:  RRR, no murmur.  Abdomen:  Flat, lack of adipose tissue, hypoactive bs x 4 quads, nontender. Upper abdominal past PEG scar intact. No mass. Rectal: Deferred. Musculoskeletal:  Symmetrical without gross  deformities.  Pulses:  Normal pulses noted. Extremities:  Without clubbing or edema.  Neurologic:  Alert and  oriented x4. No focal  deficits.  Skin:  Intact without significant lesions or rashes. Multiple tattoos. Psych:  Alert and cooperative. Normal mood and affect.  Intake/Output from previous day: 12/29 0701 - 12/30 0700 In: 877.9 [I.V.:877.9] Out: 3150 [Urine:3150] Intake/Output this shift: No intake/output data recorded.  Lab Results: Recent Labs    11/17/20 1618 11/18/20 0545 11/19/20 0507  WBC 5.9 4.1 4.0  HGB 12.0* 10.6* 11.1*  HCT 38.3* 33.6* 35.2*  PLT 265 223 214   BMET Recent Labs    11/17/20 1618 11/18/20 0545 11/19/20 0507  NA 139 141 138  K 4.1 3.6 3.3*  CL 100 101 97*  CO2 _0 GLUCOSE 105* 82 116*  BUN 48* 34* 25*  CREATININE 1.77* 1.19 1.22  CALCIUM 10.3 9.3 9.4   LFT Recent Labs    11/19/20 0507  PROT 8.3*  ALBUMIN 3.8  AST 44*  ALT 29  ALKPHOS 41  BILITOT 1.6*  BILIDIR 0.4*  IBILI 1.2*   PT/INR Recent Labs    11/19/20 0507  LABPROT 14.3  INR 1.2   Hepatitis Panel No results for input(s): HEPBSAG, HCVAB, HEPAIGM, HEPBIGM in the last 72 hours.    Studies/Results: CT Soft Tissue Neck W Contrast  Result Date: 11/17/2020 CLINICAL DATA:  Difficulty swallowing. EXAM: CT NECK WITH CONTRAST TECHNIQUE: Multidetector CT imaging of the neck was performed using the standard protocol following the bolus administration of intravenous contrast. CONTRAST:  67m OMNIPAQUE IOHEXOL 300 MG/ML  SOLN COMPARISON:  10/31/2011 FINDINGS: PHARYNX AND LARYNX: There is mild edema of the epiglottis and supraglottic larynx. The airway is clearly patent. The nasopharynx and oropharynx are normal. SALIVARY GLANDS: Absence or severe atrophy of the submandibular glands. Parotid glands are unremarkable. THYROID: Normal. LYMPH NODES: No enlarged or abnormal density lymph nodes. VASCULAR: Bilateral carotid stents. LIMITED INTRACRANIAL: Normal. VISUALIZED ORBITS: Normal. MASTOIDS AND VISUALIZED PARANASAL SINUSES: No fluid levels or advanced mucosal thickening. No mastoid effusion. SKELETON: No  bony spinal canal stenosis. No lytic or blastic lesions. UPPER CHEST: Clear. OTHER: None. IMPRESSION: 1. Mild edema of the epiglottis and supraglottic larynx, which may be due to prior radiation therapy. Acute supraglottitis may also cause this appearance. The airway is clearly patent. 2. Post treatment changes in the neck without cervical lymphadenopathy. No visible pharyngeal mass. Aortic atherosclerosis (ICD10-I70.0). Electronically Signed   By: KUlyses JarredM.D.   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 He gags/vomits food matter once weekly which occurs chronically. No frank hematemesis. A neck/soft tissue CT identified mild edema of the epiglottis and supraglottic larynx which may be due to prior radiation therapy verses acute supraglottitis. The airway was clearly patent. No visible pharyngeal mass. He underwent a laryngoscopy by ENT Dr. TBenjamine Mola11/29 which showed dry blood throughout the oral cavity and nasopharynx without evidence of a mass or abscess. The larynx and vocal cords were mildly edematous.  -NPO -Continue IV fluid 75cc/hr -EGD with possible esophageal dilatation, benefits and risks discussed including risk with sedation, risk of bleeding, perforation and infection. EGD today with Dr. GCarlean Purl  -Pantoprazole 424mIV bid -? Chest CT  2.  Severe malnutrition with  a 14lb weight loss secondary to odynophagia/dysphagia  -Most likely will require a PEG tube   3. Carotid artery disease secondary to past radiation s/p right TCAR stent placement 04/2020 and left TCAR  06/2020. Last dose of Plavix and ASA was on 12/26 -Continue to hold Plavix and ASA  4. AKI secondary to dehydration, improving. Cr 1.22  5. Hypokalemia. K+ 3.3 -KCL replacement per  the hospitalist   6. Mildly elevated AST and T. bii level. Indirect > direct  7. Normocytic anemia   8. DM II  9. History of CAD    Noralyn Pick  11/19/2020, 08:00    [facility] Attending   I have taken an interval history, reviewed the chart and examined the patient. I agree with the Advanced Practitioner's note, impression and recommendations.   Complicated situation - hx benign esophageal stricture w. Dilation  Now w/ Dysphagia after head and neck XRT   Off Plavix and ASA since 12/28 - will plan to possible dilate with balloon - higher risk but may be needed - not yet appropriate for PEG - would need to be off Plavix x 5+ days  Gatha Mayer, MD, [facility] 11/19/2020 10:12 AM

## 2020-11-19 NOTE — Anesthesia Postprocedure Evaluation (Signed)
Anesthesia Post Note  Patient: Christopher [name]  Procedure(s) Performed: ESOPHAGOGASTRODUODENOSCOPY (EGD) WITH PROPOFOL (N/A )     Patient location during evaluation: Endoscopy Anesthesia Type: MAC Level of consciousness: awake and alert Pain management: pain level controlled Vital Signs Assessment: post-procedure vital signs reviewed and stable Respiratory status: spontaneous breathing, nonlabored ventilation and respiratory function stable Cardiovascular status: blood pressure returned to baseline and stable Postop Assessment: no apparent nausea or vomiting Anesthetic complications: no   No complications documented.  Last Vitals:  Vitals:   11/19/20 1040 11/19/20 1050  BP: 122/71 (!) 158/93  Pulse: 84 94  Resp: 18 15  Temp:    SpO2: 98% 98%    Last Pain:  Vitals:   11/19/20 1050  TempSrc:   PainSc: 0-No pain                 Lidia Collum

## 2020-11-19 NOTE — Transfer of Care (Signed)
Immediate Anesthesia Transfer of Care Note  Patient: Christopher [name]  Procedure(s) Performed: ESOPHAGOGASTRODUODENOSCOPY (EGD) WITH PROPOFOL (N/A )  Patient Location: PACU  Anesthesia Type:MAC  Level of Consciousness: sedated, patient cooperative and responds to stimulation  Airway & Oxygen Therapy: Patient Spontanous Breathing and Patient connected to face mask oxygen  Post-op Assessment: Report given to RN and Post -op Vital signs reviewed and stable  Post vital signs: Reviewed and stable  Last Vitals:  Vitals Value Taken Time  BP 88/54 11/19/20 1031  Temp    Pulse 64 11/19/20 1033  Resp 14 11/19/20 1033  SpO2 100 % 11/19/20 1033  Vitals shown include unvalidated device data.  Last Pain:  Vitals:   11/19/20 0922  TempSrc: Oral  PainSc: 0-No pain         Complications: No complications documented.

## 2020-11-20 ENCOUNTER — Encounter (HOSPITAL_COMMUNITY): Payer: Self-pay | Admitting: Internal Medicine

## 2020-11-20 ENCOUNTER — Inpatient Hospital Stay (HOSPITAL_COMMUNITY): Payer: No Typology Code available for payment source

## 2020-11-20 DIAGNOSIS — Z85819 Personal history of malignant neoplasm of unspecified site of lip, oral cavity, and pharynx: Secondary | ICD-10-CM | POA: Diagnosis not present

## 2020-11-20 DIAGNOSIS — E43 Unspecified severe protein-calorie malnutrition: Secondary | ICD-10-CM | POA: Insufficient documentation

## 2020-11-20 DIAGNOSIS — R1310 Dysphagia, unspecified: Secondary | ICD-10-CM | POA: Diagnosis not present

## 2020-11-20 DIAGNOSIS — B20 Human immunodeficiency virus [HIV] disease: Secondary | ICD-10-CM | POA: Diagnosis not present

## 2020-11-20 DIAGNOSIS — N179 Acute kidney failure, unspecified: Secondary | ICD-10-CM | POA: Diagnosis not present

## 2020-11-20 LAB — GLUCOSE, CAPILLARY
Glucose-Capillary: 118 mg/dL — ABNORMAL HIGH (ref 70–99)
Glucose-Capillary: 223 mg/dL — ABNORMAL HIGH (ref 70–99)

## 2020-11-20 MED ORDER — K PHOS MONO-SOD PHOS DI & MONO 155-852-130 MG PO TABS
250.0000 mg | ORAL_TABLET | Freq: Three times a day (TID) | ORAL | Status: DC
  Administered 2020-11-20 – 2020-11-21 (×4): 250 mg via ORAL
  Filled 2020-11-20 (×5): qty 1

## 2020-11-20 NOTE — Progress Notes (Addendum)
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 He has chronic dysphagia related to ersophageal strictures. Admitted a couple of days ago after being seen in our office with with odynophagia,  worsening dysphagia and wight loss. He had and abnormal oropharynx on exam in office. Seen by ENT this admission. No obvious source of bleeding  # Dysphagia / odynophagia / malnutrition. EGD yesterday revealed mild benign appearing esophageal stenosis, edema in upper esophagus from XRT, severe oropharyngeal mucositis.  --Try Clear liquids --magic mouthwash --gastrostomy tube with IR after 5 days off Plavix. I placed order --Nutrition has evaluated.   # AKI, resolved  # Chronic, mild [place] anemia. Hgb stable at 11.1  SUBJECTIVE:   Slightly less pain with swallowing today    OBJECTIVE:    Scheduled inpatient medications:  . abacavir-dolutegravir-lamiVUDine  1 tablet Oral Daily  . FLUoxetine  20 mg Oral Daily  . insulin aspart  0-9 Units Subcutaneous Q4H  . levothyroxine  75 mcg Oral QAC breakfast  . magic mouthwash w/lidocaine  10 mL Oral TID AC & HS  . metoprolol succinate  12.5 mg Oral Daily  . pantoprazole (PROTONIX) IV  40 mg Intravenous Q12H  . phosphorus  250 mg Oral TID   Continuous inpatient infusions:  . sodium chloride 1,000 mL (11/18/20 1707)  . dextrose 5 % and 0.45% NaCl 75 mL/hr at 11/19/20 1237   PRN inpatient medications: sodium chloride, clonazePAM, morphine injection, ondansetron **OR** ondansetron (ZOFRAN) IV  Vital signs in last 24 hours: Temp:  [97.9 F (36.6 C)-98.5 F (36.9 C)] 98.5 F (36.9 C) (12/31 0547) Pulse Rate:  [66-94] 66 (12/31 0547) Resp:  [15-19] 18 (12/31 0547) BP: (88-158)/(54-93) 130/79 (12/31 0547) SpO2:  [94 %-100 %] 94 % (12/31 0547) Last BM Date:  11/19/20  Intake/Output Summary (Last 24 hours) at 11/20/2020 0846 Last data filed at 11/20/2020 0600 Gross per 24 hour  Intake 1027.58 ml  Output 1675 ml  Net -647.42 ml     Physical Exam:  . General: Alert, emaciated male in NAD . Heart:  Regular rate and rhythm. No lower extremity edema . Pulmonary: Normal respiratory effort . Abdomen: Soft, nondistended, nontender. Normal bowel sounds.  . Neurologic: Alert and oriented . Psych: Pleasant. Cooperative.   Filed Weights   11/17/20 1608  Weight: 57.3 kg    Intake/Output from previous day: 12/30 0701 - 12/31 0700 In: 1027.6 [P.O.:120; I.V.:907.6] Out: 1675 [Urine:1675] Intake/Output this shift: No intake/output data recorded.    Lab Results: Recent Labs    11/17/20 1618 11/18/20 0545 11/19/20 0507  WBC 5.9 4.1 4.0  HGB 12.0* 10.6* 11.1*  HCT 38.3* 33.6* 35.2*  PLT 265 223 214   BMET Recent Labs    11/17/20 1618 11/18/20 0545 11/19/20 0507  NA 139 141 138  K 4.1 3.6 3.3*  CL 100 101 97*  CO2 25 26 26   GLUCOSE 105* 82 116*  BUN 48* 34* 25*  CREATININE 1.77* 1.19 1.22  CALCIUM 10.3 9.3 9.4   LFT Recent Labs    11/19/20 0507  PROT 8.3*  ALBUMIN 3.8  AST 44*  ALT 29  ALKPHOS 41  BILITOT 1.6*  BILIDIR 0.4*  IBILI 1.2*   PT/INR Recent Labs    11/19/20 0507  LABPROT 14.3  INR  1.2   Hepatitis Panel No results for input(s): HEPBSAG, HCVAB, HEPAIGM, HEPBIGM in the last 72 hours.  No results found.    Principal Problem:   Odynophagia Active Problems:   Human immunodeficiency virus (HIV) disease ([place])   Hyperlipidemia associated with type 2 diabetes mellitus ([place])   Hypertension associated with diabetes ([place])   Hypothyroidism   Diabetes ([place])   Acute kidney injury superimposed on CKD ([place])   Protein-calorie malnutrition, severe     LOS: 3 days   Christopher [name] ,NP 11/20/2020, 8:46 AM   Attending physician's note   I have taken an interval history, reviewed the chart and  examined the patient. I agree with the Advanced Practitioner's note, impression and recommendations.   Odynophagia d/t severe oropharyngeal mucositis related to XRT on EGD yesterday. Tonsillary carcinoma s/p Sx/chemo/XRT Failure to thrive Comorbid conditions include HIV, CAD s/p DES on ASA/plavix (last dose 12/28), CKD, DM2  He does feel better on Magic mouthwash.  Tolerating clear liquids.  Plan: -Proceed with PEG (for nutritional support) through IR after Plavix washout.  This has already been scheduled. -Nutrition consult on board. -Can advance diet gradually to full liquids and then ad lib (can be used in addition to PEG) -Appreciate ENTs assistance. -Continue Protonix. -Discussed in detail with pt. -Would sign off for now.  Please call if with any ?Tamsen Meek, MD [facility] [phone]

## 2020-11-20 NOTE — Progress Notes (Addendum)
[facility]  PROGRESS NOTE  Christopher [name]:[id number] DOB: May 23, 1951 DOA: 11/17/2020 PCP: Lewis Moccasin, MD   Brief HPI:   69 year old male with history of right tonsillar cancer, status post surgery, radiation, chemotherapy also has history of chronic dysphagia, esophageal stricture, HIV, CAD, CKD stage III, diabetes mellitus type 2, hypertension, hyperlipidemia, hypothyroidism, carotid artery stenosis s/p bilateral revascularization/stenting, depression/anxiety came to ED with worsening odynophagia, dysphagia also coughing up blood.    Subjective   Patient seen and examined, denies any complaints.  EGD done yesterday showed mild benign-appearing esophageal stenosis and edema in the upper esophagus from XRT, severe oropharyngeal mucositis.  Plan for G-tube on Monday.   Assessment/Plan:     1. Hemoptysis versus hematemesis-patient  has history of tonsillar cancer s/p chemo XRT.  He was evaluated by ENT, no obvious source of bleeding noted on bronchoscopy.  Recommend GI evaluation.  GI was consulted, patient underwent EGD which only showed oropharyngeal mucositis and stable esophageal stenosis.  GI recommends PEG tube placement.  Patient has been off Plavix since December 26, discussed Dr. Leone Payor.  IR has been consulted for G-tube placement on Monday, 11/23/2020.   2. Acute kidney injury on CKD stage IIIa-likely from dehydration, poor p.o. intake.  Resolved after IV hydration. 3. HIV-we will resume home meds. 4. Diabetes mellitus type 2-continue sliding scale insulin with NovoLog, check CBG every 4 hours.  We will start back on clear liquid diet.  Patient is also on D5 half-normal saline for hypoglycemia.  We will continue IV fluids for now and continue to monitor his blood glucose. 5. Hypokalemia-replace potassium and check BMP in am. 6. Hypophosphatemia-start K-Phos Neutral to 50 mg p.o. 3 times daily.  Follow phosphorus level in a.m. 7. Hypertension-benazepril on hold given  AKI. 8. Hypothyroidism-resume Synthroid. 9. CAD-no chest pain, aspirin/Plavix/statin on hold for now. 10. Carotid artery stenosis s/p bilateral revascularization/stent placement-underwent right-sided revascularization on 05/10/2020 and left-sided on 8-21 by Dr. Darrick Penna.  He is on aspirin/Plavix indefinitely however these medications on hold due to ongoing hemoptysis/hematemesis.  These medications are currently on hold for PEG tube placement.  The benefit of holding these medications outweigh the risks at this time.      COVID-19 Labs  No results for input(s): DDIMER, FERRITIN, LDH, CRP in the last 72 hours.  Lab Results  Component Value Date   SARSCOV2NAA NEGATIVE 11/17/2020   SARSCOV2NAA NEGATIVE 06/19/2020   SARSCOV2NAA NEGATIVE 05/14/2020   SARSCOV2NAA NEGATIVE 05/01/2020     Scheduled medications:   . abacavir-dolutegravir-lamiVUDine  1 tablet Oral Daily  . FLUoxetine  20 mg Oral Daily  . insulin aspart  0-9 Units Subcutaneous Q4H  . levothyroxine  75 mcg Oral QAC breakfast  . magic mouthwash w/lidocaine  10 mL Oral TID AC & HS  . metoprolol succinate  12.5 mg Oral Daily  . pantoprazole (PROTONIX) IV  40 mg Intravenous Q12H  . phosphorus  250 mg Oral TID         CBG: Recent Labs  Lab 11/19/20 0735 11/19/20 1137 11/19/20 1547 11/19/20 2044 11/20/20 0007  GLUCAP 119* 129* 130* 115* 118*    SpO2: 94 % O2 Flow Rate (L/min): 5 L/min    CBC: Recent Labs  Lab 11/17/20 1618 11/18/20 0545 11/19/20 0507  WBC 5.9 4.1 4.0  NEUTROABS 4.5  --   --   HGB 12.0* 10.6* 11.1*  HCT 38.3* 33.6* 35.2*  MCV 100.8* 98.8 99.7  PLT 265 223 214    Basic Metabolic Panel:  Recent Labs  Lab 11/17/20 1618 11/18/20 0545 11/19/20 0507  NA 139 141 138  K 4.1 3.6 3.3*  CL 100 101 97*  CO2 25 26 26   GLUCOSE 105* 82 116*  BUN 48* 34* 25*  CREATININE 1.77* 1.19 1.22  CALCIUM 10.3 9.3 9.4  MG  --  1.8  --   PHOS  --  1.6*  --      Liver Function Tests: Recent Labs   Lab 11/17/20 1618 11/19/20 0507  AST 45* 44*  ALT 32 29  ALKPHOS 49 41  BILITOT 1.4* 1.6*  PROT 8.8* 8.3*  ALBUMIN 4.3 3.8     Antibiotics: Anti-infectives (From admission, onward)   Start     Dose/Rate Route Frequency Ordered Stop   11/19/20 1600  dolutegravir (TIVICAY) tablet 50 mg  Status:  Discontinued        50 mg Oral Daily 11/19/20 1418 11/19/20 1448   11/19/20 1600  abacavir-dolutegravir-lamiVUDine (TRIUMEQ) 600-50-300 MG per tablet 1 tablet        1 tablet Oral Daily 11/19/20 1435         DVT prophylaxis: SCDs  Code Status: DNR  Family Communication: No family at bedside   Consultants:  ENT  Procedures:      Objective   Vitals:   11/19/20 1050 11/19/20 1403 11/19/20 2047 11/20/20 0547  BP: (!) 158/93 (!) 153/92 (!) 156/93 130/79  Pulse: 94 93 84 66  Resp: 15 17 18 18   Temp:  98.2 F (36.8 C) 98.4 F (36.9 C) 98.5 F (36.9 C)  TempSrc:  Oral Oral Oral  SpO2: 98% 95% 97% 94%  Weight:      Height:        Intake/Output Summary (Last 24 hours) at 11/20/2020 1348 Last data filed at 11/20/2020 1000 Gross per 24 hour  Intake 827.58 ml  Output 1225 ml  Net -397.42 ml    12/29 1901 - 12/31 0700 In: 1905.4 [P.O.:120; I.V.:1785.4] Out: 2325 [Urine:2325]  Filed Weights   11/17/20 1608  Weight: 57.3 kg    Physical Examination:   General-appears in no acute distress  Heart-S1-S2, regular, no murmur auscultated  Lungs-clear to auscultation bilaterally, no wheezing or crackles auscultated  Abdomen-soft, nontender, no organomegaly  Extremities-no edema in the lower extremities  Neuro-alert, oriented x3, no focal deficit noted   Status is: Inpatient  Dispo: The patient is from: Home              Anticipated d/c is to: Home              Anticipated d/c date is: 11/24/2020              Patient currently not medically stable for discharge  Barrier to discharge-odynophagia, dysphagia,?  Hematemesis       Data Reviewed:    Recent Results (from the past 240 hour(s))  Resp Panel by RT-PCR (Flu A&B, Covid) Nasopharyngeal Swab     Status: None   Collection Time: 11/17/20  9:10 PM   Specimen: Nasopharyngeal Swab; Nasopharyngeal(NP) swabs in vial transport medium  Result Value Ref Range Status   SARS Coronavirus 2 by RT PCR NEGATIVE NEGATIVE Final    Comment: (NOTE) SARS-CoV-2 target nucleic acids are NOT DETECTED.  The SARS-CoV-2 RNA is generally detectable in upper respiratory specimens during the acute phase of infection. The lowest concentration of SARS-CoV-2 viral copies this assay can detect is 138 copies/mL. A negative result does not preclude SARS-Cov-2 infection and should not be used  as the sole basis for treatment or other patient management decisions. A negative result may occur with  improper specimen collection/handling, submission of specimen other than nasopharyngeal swab, presence of viral mutation(s) within the areas targeted by this assay, and inadequate number of viral copies(<138 copies/mL). A negative result must be combined with clinical observations, patient history, and epidemiological information. The expected result is Negative.  Fact Sheet for Patients:  EntrepreneurPulse.com.au  Fact Sheet for Healthcare Providers:  IncredibleEmployment.be  This test is no t yet approved or cleared by the [place] FDA and  has been authorized for detection and/or diagnosis of SARS-CoV-2 by FDA under an Emergency Use Authorization (EUA). This EUA will remain  in effect (meaning this test can be used) for the duration of the COVID-19 declaration under Section 564(b)(1) of the Act, 21 U.S.C.section 360bbb-3(b)(1), unless the authorization is terminated  or revoked sooner.       Influenza A by PCR NEGATIVE NEGATIVE Final   Influenza B by PCR NEGATIVE NEGATIVE Final    Comment: (NOTE) The Xpert Xpress SARS-CoV-2/FLU/RSV plus assay is intended as an  aid in the diagnosis of influenza from Nasopharyngeal swab specimens and should not be used as a sole basis for treatment. Nasal washings and aspirates are unacceptable for Xpert Xpress SARS-CoV-2/FLU/RSV testing.  Fact Sheet for Patients: EntrepreneurPulse.com.au  Fact Sheet for Healthcare Providers: IncredibleEmployment.be  This test is not yet approved or cleared by the [place] FDA and has been authorized for detection and/or diagnosis of SARS-CoV-2 by FDA under an Emergency Use Authorization (EUA). This EUA will remain in effect (meaning this test can be used) for the duration of the COVID-19 declaration under Section 564(b)(1) of the Act, 21 U.S.C. section 360bbb-3(b)(1), unless the authorization is terminated or revoked.  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 Evaluation for possible gastrostomy tube placement. EGD yesterday demonstrated normal stomach. EXAM: CT ABDOMEN WITHOUT CONTRAST TECHNIQUE: Multidetector CT imaging of the abdomen was performed following the standard protocol without IV contrast. COMPARISON:  Right upper quadrant abdominal ultrasound of 04/13/2020 FINDINGS: Lower chest: Mild atelectasis at the posterior right lung base. Hepatobiliary: Unremarkable unenhanced appearance of the liver. Multiple calcified gallstones in a nondistended gallbladder. No biliary ductal dilatation. Pancreas: Unremarkable. No pancreatic ductal dilatation or surrounding inflammatory changes. Spleen: Moderate splenomegaly. Adrenals/Urinary Tract: No adrenal masses. No hydronephrosis or visible renal calculi. Benign cyst of the lower left kidney measures approximately 2.2 cm in greatest diameter.  Stomach/Bowel: No hiatal hernia. The stomach is under distended and normal in position. The stomach lie superior to the transverse colon. No evidence of bowel obstruction, ileus or inflammation. No free intraperitoneal air. Vascular/Lymphatic: Aortic atherosclerosis without aneurysm. No enlarged abdominal lymph nodes. Other: No abdominal wall hernia, ascites, abnormal fluid collections or body wall edema. Musculoskeletal: No acute or significant osseous findings. IMPRESSION: 1. No acute findings in the abdomen or pelvis. 2. Normal gastric positioning and no prohibitive anatomy to percutaneous gastrostomy tube placement. 3. Moderate splenomegaly. 4. Cholelithiasis. 5. Aortic atherosclerosis. Aortic Atherosclerosis (ICD10-I70.0). Electronically Signed   By: Aletta Edouard M.D.   On: 11/20/2020 12:07       Oswald Hillock   [facility] If 7PM-7AM, please contact night-coverage at [email or web address], Office  [phone]   11/20/2020, 1:48 PM  LOS: 3 days

## 2020-11-20 NOTE — Consult Note (Signed)
Chief Complaint: Patient was seen in consultation today for dysphagia/percutaneous gastrostomy tube placement.  Referring Physician(s): Willia Craze (GI)  Supervising Physician: Jacqulynn Cadet  Patient Status: [name] - In-pt  History of Present Illness: Christopher [name] is a 69 y.o. male with a past medical history of hypertension, hyperlipidemia, CAD, GERD, CKD, nephrolithiasis, tonsillar cancer, hypothyroidisim, TB, syphilis, HIV diabetes mellitus, cataracts, and anxiety. 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 He presented to [facility] ED 11/17/2020 with complaints of progressive dysphagia x 4-5 weeks. In ED, ENT was consulted who recommended admission for management of malnutrition and gastrostomy tube placement. He was admitted for further management. GI was consulted who recommended IR consult for possible gastrostomy tube placement.  IR consulted by Tye Savoy, NP for possible image-guided percutaneous gastrostomy tube placement. Patient awake and alert sitting in bed watching TV with no complaints at this time. Denies fever, chills, chest pain, dyspnea, abdominal pain, or headache.  LD Plavix per patient was Sunday 11/15/2020; per chart Plavix has not been given since admission (Tuesday 11/17/2020).   Past Medical History:  Diagnosis Date  . Anxiety   . Cataract   . Chronic kidney disease   . 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   . Diabetes mellitus without complication ([place])   . GERD (gastroesophageal reflux disease)   . Heart murmur    CHILDHOOD  . History of kidney stones   . HIV DISEASE 12/01/2006  . HYPERLIPIDEMIA, WITH LOW HDL  01/03/2008  . Hypertension   . HYPERTENSION 12/01/2006  . HYPOGONADISM 12/23/2009  . Hypothyroidism   . Neuromuscular disorder ([place])   . SYPHILIS, [place], LATENT NOS 01/18/2007  . Thyroid disease   . Tonsillar cancer ([place])    tonsillar ca   . Tuberculosis    history of TB about 30 years ago    Past Surgical History:  Procedure Laterality Date  . COLONOSCOPY    . CORONARY ANGIOGRAPHY N/A 05/05/2020   Procedure: CORONARY ANGIOGRAPHY;  Surgeon: Nigel Mormon, MD;  Location: [place] [facility];  Service: Cardiovascular;  Laterality: N/A;  . ESOPHAGOGASTRODUODENOSCOPY (EGD) WITH PROPOFOL N/A 11/19/2020   Procedure: ESOPHAGOGASTRODUODENOSCOPY (EGD) WITH PROPOFOL;  Surgeon: Gatha Mayer, MD;  Location: [facility];  Service: Endoscopy;  Laterality: N/A;  Possible esophageal dilatation  . TONSILECTOMY, ADENOIDECTOMY, BILATERAL MYRINGOTOMY AND TUBES     tonsil cancer   . TONSILLECTOMY    . TRANSCAROTID ARTERY REVASCULARIZATION Right 05/18/2020   Procedure: RIGHT TRANSCAROTID ARTERY REVASCULARIZATION;  Surgeon: Elam Dutch, MD;  Location: [place];  Service: Vascular;  Laterality: Right;  . TRANSCAROTID ARTERY REVASCULARIZATION Left 06/22/2020   Procedure: LEFT TRANSCAROTID ARTERY REVASCULARIZATION;  Surgeon: Elam Dutch, MD;  Location: [place];  Service: Vascular;  Laterality: Left;  . ULTRASOUND GUIDANCE FOR VASCULAR ACCESS Right 06/22/2020   Procedure: ULTRASOUND GUIDANCE FOR VASCULAR ACCESS;  Surgeon: Elam Dutch, MD;  Location: [facility] OR;  Service: Vascular;  Laterality: Right;  . UPPER GASTROINTESTINAL ENDOSCOPY      Allergies: Codeine, Sulfamethoxazole-trimethoprim, and Sulfonamide derivatives  Medications: Prior to Admission medications   Medication Sig Start Date End Date Taking? Authorizing Provider  aspirin EC 81 MG EC tablet Take 1  tablet (81 mg total) by mouth daily. 04/17/20  Yes Ghimire, Henreitta Leber, MD  benazepril (LOTENSIN) 10 MG tablet Take 10 mg by mouth  daily.    Yes [provider]  clonazePAM (KLONOPIN) 1 MG tablet TAKE 1 TABLET 3 TIMES A DAY AS NEEDED FOR ANXIETY Patient taking differently: Take 1 mg by mouth 3 (three) times daily as needed for anxiety.   Yes Michel Bickers, MD  clopidogrel (PLAVIX) 75 MG tablet Take 1 tablet (75 mg total) by mouth daily. 04/17/20  Yes Ghimire, Henreitta Leber, MD  diphenhydrAMINE (BENADRYL) 25 MG tablet Take 50 mg by mouth at bedtime as needed for sleep.   Yes [provider]  dolutegravir (TIVICAY) 50 MG tablet Take 50 mg by mouth daily.  12/05/12 12/05/28 Yes Truman Hayward, MD  fenofibrate (TRICOR) 145 MG tablet TAKE ONE TABLET BY MOUTH DAILY Patient taking differently: Take 145 mg by mouth daily. 05/04/20  Yes Michel Bickers, MD  FLUoxetine (PROZAC) 20 MG capsule TAKE ONE CAPSULE BY MOUTH DAILY Patient taking differently: Take 20 mg by mouth daily. 07/06/20  Yes Michel Bickers, MD  folic acid (FOLVITE) 1 MG tablet Take 2 tablets (2 mg total) by mouth daily. 04/17/20  Yes Ghimire, Henreitta Leber, MD  HUMALOG KWIKPEN 100 UNIT/ML KwikPen Inject 1-15 Units into the skin 3 (three) times daily as needed (BS). Per sliding scale 04/16/20  Yes [provider]  insulin glargine (LANTUS) 100 UNIT/ML Solostar Pen Inject 26 Units into the skin daily at 10 pm. Patient taking differently: Inject 20 Units into the skin daily at 10 pm. 04/16/20  Yes Ghimire, Henreitta Leber, MD  levothyroxine (SYNTHROID) 75 MCG tablet Take 75 mcg by mouth daily before breakfast. 08/24/20  Yes [provider]  metoprolol succinate (TOPROL-XL) 25 MG 24 hr tablet Take 1 tablet (25 mg total) by mouth daily. Patient taking differently: Take 12.5 mg by mouth daily. 04/17/20  Yes Ghimire, Henreitta Leber, MD  rosuvastatin (CRESTOR) 20 MG tablet Take 1 tablet (20 mg total) by mouth daily. 04/17/20  Yes Ghimire, Henreitta Leber, MD  sertraline (ZOLOFT) 100 MG tablet Take 100 mg by mouth daily. 09/14/20  Yes [provider]  temazepam  (RESTORIL) 30 MG capsule Take 30 mg by mouth at bedtime. 07/26/16  Yes [provider]  [place] 600-50-300 MG tablet TAKE ONE TABLET BY MOUTH DAILY Patient taking differently: Take 1 tablet by mouth daily. 12/23/19  Yes Michel Bickers, MD  blood glucose meter kit and supplies Dispense based on patient and insurance preference. Use up to four times daily as directed. (FOR ICD-10 E10.9, E11.9). 04/16/20   Ghimire, Henreitta Leber, MD  CONTOUR NEXT TEST test strip  06/01/20   [provider]  Insulin Pen Needle 32G X 8 MM MISC Use as directed 04/16/20   Jonetta Osgood, MD  Dolutegravir Sodium (TIVICAY) 50 MG TABS Take 1 tablet (50 mg total) by mouth daily. 12/05/12 12/05/28  Truman Hayward, MD     Family History  Problem Relation Age of Onset  . Diabetes Mother   . COPD Mother   . Heart attack Father   . Colon cancer Neg Hx   . Colon polyps Neg Hx   . Esophageal cancer Neg Hx   . Rectal cancer Neg Hx   . Stomach cancer Neg Hx     Social History   Socioeconomic History  . Marital status: Married    Spouse name: Not on file  . Number of  children: 0  . Years of education: Not on file  . Highest education level: Not on file  Occupational History  . Occupation: retired  Tobacco Use  . Smoking status: Former Research scientist (life sciences)  . Smokeless tobacco: Never Used  . Tobacco comment: quit when he was 69 years old.  Vaping Use  . Vaping Use: Never used  Substance and Sexual Activity  . Alcohol use: Yes    Alcohol/week: 2.0 standard drinks    Types: 2 Shots of liquor per week    Comment: "COUPLE TIMES A WEEK"  . Drug use: No    Comment: 40 years ago used multiple drugs  . Sexual activity: Yes    Comment: declined condoms  Other Topics Concern  . Not on file  Social History Narrative   Lives with husband   Social Determinants of Health   Financial Resource Strain: Not on file  Food Insecurity: Not on file  Transportation Needs: Not on file  Physical Activity: Not on file   Stress: Not on file  Social Connections: Not on file     Review of Systems: A 12 point ROS discussed and pertinent positives are indicated in the HPI above.  All other systems are negative.  Review of Systems  Constitutional: Negative for chills and fever.  Respiratory: Negative for shortness of breath and wheezing.   Cardiovascular: Negative for chest pain and palpitations.  Gastrointestinal: Negative for abdominal pain.  Neurological: Negative for headaches.  Psychiatric/Behavioral: Negative for behavioral problems and confusion.    Vital Signs: BP 130/79 (BP Location: Left Arm)   Pulse 66   Temp 98.5 F (36.9 C) (Oral)   Resp 18   Ht 5' 8.5" (1.74 m)   Wt 126 lb 6 oz (57.3 kg)   SpO2 94%   BMI 18.94 kg/m   Physical Exam Vitals and nursing note reviewed.  Constitutional:      Appearance: Normal appearance.     Comments: Emaciated.  Cardiovascular:     Rate and Rhythm: Normal rate and regular rhythm.     Heart sounds: Normal heart sounds. No murmur heard.   Pulmonary:     Effort: Pulmonary effort is normal. No respiratory distress.     Breath sounds: Normal breath sounds. No wheezing.  Skin:    General: Skin is warm and dry.  Neurological:     Mental Status: He is alert and oriented to person, place, and time.      MD Evaluation Airway: WNL Heart: WNL Abdomen: WNL Chest/ Lungs: WNL ASA  Classification: 3 Mallampati/Airway Score: Two   Imaging: CT ABDOMEN WO CONTRAST  Result Date: 11/20/2020 CLINICAL DATA:  Difficulty swallowing, dysphagia and history of head and neck carcinoma with prior gastrostomy tube placement in 2011. Evaluation for possible gastrostomy tube placement. EGD yesterday demonstrated normal stomach. EXAM: CT ABDOMEN WITHOUT CONTRAST TECHNIQUE: Multidetector CT imaging of the abdomen was performed following the standard protocol without IV contrast. COMPARISON:  Right upper quadrant abdominal ultrasound of 04/13/2020 FINDINGS: Lower  chest: Mild atelectasis at the posterior right lung base. Hepatobiliary: Unremarkable unenhanced appearance of the liver. Multiple calcified gallstones in a nondistended gallbladder. No biliary ductal dilatation. Pancreas: Unremarkable. No pancreatic ductal dilatation or surrounding inflammatory changes. Spleen: Moderate splenomegaly. Adrenals/Urinary Tract: No adrenal masses. No hydronephrosis or visible renal calculi. Benign cyst of the lower left kidney measures approximately 2.2 cm in greatest diameter. Stomach/Bowel: No hiatal hernia. The stomach is under distended and normal in position. The stomach lie superior to the transverse colon.  No evidence of bowel obstruction, ileus or inflammation. No free intraperitoneal air. Vascular/Lymphatic: Aortic atherosclerosis without aneurysm. No enlarged abdominal lymph nodes. Other: No abdominal wall hernia, ascites, abnormal fluid collections or body wall edema. Musculoskeletal: No acute or significant osseous findings. IMPRESSION: 1. No acute findings in the abdomen or pelvis. 2. Normal gastric positioning and no prohibitive anatomy to percutaneous gastrostomy tube placement. 3. Moderate splenomegaly. 4. Cholelithiasis. 5. Aortic atherosclerosis. Aortic Atherosclerosis (ICD10-I70.0). Electronically Signed   By: Aletta Edouard M.D.   On: 11/20/2020 12:07   CT Soft Tissue Neck W Contrast  Result Date: 11/17/2020 CLINICAL DATA:  Difficulty swallowing. EXAM: CT NECK WITH CONTRAST TECHNIQUE: Multidetector CT imaging of the neck was performed using the standard protocol following the bolus administration of intravenous contrast. CONTRAST:  34m OMNIPAQUE IOHEXOL 300 MG/ML  SOLN COMPARISON:  10/31/2011 FINDINGS: PHARYNX AND LARYNX: There is mild edema of the epiglottis and supraglottic larynx. The airway is clearly patent. The nasopharynx and oropharynx are normal. SALIVARY GLANDS: Absence or severe atrophy of the submandibular glands. Parotid glands are unremarkable.  THYROID: Normal. LYMPH NODES: No enlarged or abnormal density lymph nodes. VASCULAR: Bilateral carotid stents. LIMITED INTRACRANIAL: Normal. VISUALIZED ORBITS: Normal. MASTOIDS AND VISUALIZED PARANASAL SINUSES: No fluid levels or advanced mucosal thickening. No mastoid effusion. SKELETON: No bony spinal canal stenosis. No lytic or blastic lesions. UPPER CHEST: Clear. OTHER: None. IMPRESSION: 1. Mild edema of the epiglottis and supraglottic larynx, which may be due to prior radiation therapy. Acute supraglottitis may also cause this appearance. The airway is clearly patent. 2. Post treatment changes in the neck without cervical lymphadenopathy. No visible pharyngeal mass. Aortic atherosclerosis (ICD10-I70.0). Electronically Signed   By: KUlyses JarredM.D.   On: 11/17/2020 20:29    Labs:  CBC: Recent Labs    06/23/20 0615 11/17/20 1618 11/18/20 0545 11/19/20 0507  WBC 3.8* 5.9 4.1 4.0  HGB 9.4* 12.0* 10.6* 11.1*  HCT 29.8* 38.3* 33.6* 35.2*  PLT 149* 265 223 214    COAGS: Recent Labs    05/13/20 1210 06/17/20 1137 11/19/20 0507  INR 1.1 1.2 1.2  APTT 45* 39*  --     BMP: Recent Labs    05/13/20 1210 05/19/20 0323 06/17/20 1137 06/23/20 0615 11/17/20 1618 11/18/20 0545 11/19/20 0507  NA 139 135 139 136 139 141 138  K 4.5 3.6 3.9 3.3* 4.1 3.6 3.3*  CL 104 102 104 102 100 101 97*  CO2 25 22 24 26 25 26 26   GLUCOSE 163* 122* 125* 118* 105* 82 116*  BUN 23 22 13 11  48* 34* 25*  CALCIUM 10.4* 9.4 9.3 9.2 10.3 9.3 9.4  CREATININE 1.47* 1.28* 1.29* 1.23 1.77* 1.19 1.22  GFRNONAA 48* 56* 56* 59* 41* >60 >60  GFRAA 55* >60 >60 >60  --   --   --     LIVER FUNCTION TESTS: Recent Labs    05/13/20 1210 06/17/20 1137 11/17/20 1618 11/19/20 0507  BILITOT 0.8 0.6 1.4* 1.6*  AST 46* 38 45* 44*  ALT 38 22 32 29  ALKPHOS 46 44 49 41  PROT 8.8* 7.8 8.8* 8.3*  ALBUMIN 4.4 4.0 4.3 3.8     Assessment and Plan:  History of tonsillar cancer complicated by  dysphagia/odynophagia/malnutrition. Plan for image-guided percutaneous gastrostomy tube placement in IR tentatively for Monday 11/23/2020 pending IR scheduling. Patient will be NPO at midnight prior to procedure. Afebrile. Will hold Plavix per IR protocol. INR 1.2 11/19/2020.  Risks and benefits discussed with the patient  including, but not limited to the need for a barium enema during the procedure, bleeding, infection, peritonitis, or damage to adjacent structures. All of the patient's questions were answered, patient is agreeable to proceed. Consent signed and in chart.   Thank you for this interesting consult.  I greatly enjoyed meeting KHIRY PASQUARIELLO and look forward to participating in their care.  A copy of this report was sent to the requesting provider on this date.  Electronically Signed: Earley Abide, PA-C 11/20/2020, 1:31 PM   I spent a total of 40 Minutes in face to face in clinical consultation, greater than 50% of which was counseling/coordinating care for dysphagia/percutaneous gastrostomy tube placement.

## 2020-11-21 DIAGNOSIS — N179 Acute kidney failure, unspecified: Secondary | ICD-10-CM | POA: Diagnosis not present

## 2020-11-21 DIAGNOSIS — B20 Human immunodeficiency virus [HIV] disease: Secondary | ICD-10-CM | POA: Diagnosis not present

## 2020-11-21 DIAGNOSIS — R1310 Dysphagia, unspecified: Secondary | ICD-10-CM | POA: Diagnosis not present

## 2020-11-21 DIAGNOSIS — Z85819 Personal history of malignant neoplasm of unspecified site of lip, oral cavity, and pharynx: Secondary | ICD-10-CM | POA: Diagnosis not present

## 2020-11-21 LAB — BASIC METABOLIC PANEL
Anion gap: 8 (ref 5–15)
BUN: 19 mg/dL (ref 8–23)
CO2: 31 mmol/L (ref 22–32)
Calcium: 9.1 mg/dL (ref 8.9–10.3)
Chloride: 99 mmol/L (ref 98–111)
Creatinine, Ser: 1.21 mg/dL (ref 0.61–1.24)
GFR, Estimated: >60 mL/min (ref >60)
Glucose, Bld: 107 mg/dL — ABNORMAL HIGH (ref 70–99)
Potassium: 3 mmol/L — ABNORMAL LOW (ref 3.5–5.1)
Sodium: 138 mmol/L (ref 135–145)

## 2020-11-21 LAB — GLUCOSE, CAPILLARY
Glucose-Capillary: 114 mg/dL — ABNORMAL HIGH (ref 70–99)
Glucose-Capillary: 118 mg/dL — ABNORMAL HIGH (ref 70–99)
Glucose-Capillary: 126 mg/dL — ABNORMAL HIGH (ref 70–99)
Glucose-Capillary: 145 mg/dL — ABNORMAL HIGH (ref 70–99)

## 2020-11-21 LAB — PHOSPHORUS: Phosphorus: 2.6 mg/dL (ref 2.5–4.6)

## 2020-11-21 MED ORDER — TEMAZEPAM 15 MG PO CAPS
30.0000 mg | ORAL_CAPSULE | Freq: Every day | ORAL | Status: DC
  Administered 2020-11-21 – 2020-11-26 (×6): 30 mg via ORAL
  Filled 2020-11-21 (×6): qty 2

## 2020-11-21 MED ORDER — K PHOS MONO-SOD PHOS DI & MONO 155-852-130 MG PO TABS
250.0000 mg | ORAL_TABLET | Freq: Three times a day (TID) | ORAL | Status: AC
  Administered 2020-11-21 – 2020-11-22 (×3): 250 mg via ORAL
  Filled 2020-11-21 (×3): qty 1

## 2020-11-21 MED ORDER — POTASSIUM CHLORIDE 10 MEQ/100ML IV SOLN
10.0000 meq | INTRAVENOUS | Status: AC
  Administered 2020-11-21 (×3): 10 meq via INTRAVENOUS
  Filled 2020-11-21: qty 100

## 2020-11-21 NOTE — Progress Notes (Signed)
[facility]  PROGRESS NOTE  Christopher [name] [id number] DOB: 26-Jul-1951 DOA: 11/17/2020 PCP: Fanny Bien, MD   Brief HPI:   70 year old male with history of right tonsillar cancer, status post surgery, radiation, chemotherapy also has history of chronic dysphagia, esophageal stricture, HIV, CAD, CKD stage III, diabetes mellitus type 2, hypertension, hyperlipidemia, hypothyroidism, carotid artery stenosis s/p bilateral revascularization/stenting, depression/anxiety came to ED with worsening odynophagia, dysphagia also coughing up blood.  Patient underwent EGD which showed mild benign-appearing esophageal stenosis and edema in the upper esophagus from XRT, severe nasopharyngeal mucositis.  IR was consulted for G-tube placement.    Subjective   Patient seen and examined, denies any new complaints.  Plan for G-tube  placement per IR tentatively scheduled on 11/23/2020.   Assessment/Plan:     1. Hemoptysis versus hematemesis-patient  has history of tonsillar cancer s/p chemo XRT.  He was evaluated by ENT, no obvious source of bleeding noted on bronchoscopy.  Recommend GI evaluation.  GI was consulted, patient underwent EGD which only showed oropharyngeal mucositis and stable esophageal stenosis.  GI recommends PEG tube placement.  Patient has been off Plavix since December 26, discussed Dr. Carlean Purl.  IR has been consulted for G-tube placement on Monday, 11/23/2020.   2. Acute kidney injury on CKD stage IIIa-likely from dehydration, poor p.o. intake.  Resolved after IV hydration. 3. HIV-continue antiretroviral therapy. 4. Diabetes mellitus type 2-continue sliding scale insulin with NovoLog, check CBG every 4 hours.  We will start back on clear liquid diet.  Patient is also on D5 half-normal saline for hypoglycemia.  We will continue IV fluids for now and continue to monitor his blood glucose. 5. Hypokalemia-replace potassium and check BMP in am. 6. Hypophosphatemia-start K-Phos Neutral  250 mg p.o. 3 times daily.  Follow phosphorus level in a.m. 7. Hypertension-benazepril on hold given AKI. 8. Hypothyroidism-resume Synthroid. 9. CAD-no chest pain, aspirin/Plavix/statin on hold for now. 10. Carotid artery stenosis s/p bilateral revascularization/stent placement-underwent right-sided revascularization on 05/10/2020 and left-sided on 8-21 by Dr. Oneida Alar.  He is on aspirin/Plavix indefinitely however these medications on hold due to ongoing hemoptysis/hematemesis.  These medications are currently on hold for PEG tube placement.  The benefit of holding these medications outweigh the risks at this time.      COVID-19 Labs  No results for input(s): DDIMER, FERRITIN, LDH, CRP in the last 72 hours.  Lab Results  Component Value Date   SARSCOV2NAA NEGATIVE 11/17/2020   [place] NEGATIVE 06/19/2020   [place] NEGATIVE 05/14/2020   [place] NEGATIVE 05/01/2020     Scheduled medications:   . abacavir-dolutegravir-lamiVUDine  1 tablet Oral Daily  . FLUoxetine  20 mg Oral Daily  . insulin aspart  0-9 Units Subcutaneous Q4H  . levothyroxine  75 mcg Oral QAC breakfast  . magic mouthwash w/lidocaine  10 mL Oral TID AC & HS  . metoprolol succinate  12.5 mg Oral Daily  . pantoprazole (PROTONIX) IV  40 mg Intravenous Q12H  . phosphorus  250 mg Oral TID  . temazepam  30 mg Oral QHS         CBG: Recent Labs  Lab 11/20/20 0007 11/20/20 1534 11/21/20 0027 11/21/20 0405 11/21/20 0751  GLUCAP 118* 223* 126* 114* 118*    SpO2: 95 % O2 Flow Rate (L/min): 5 L/min    CBC: Recent Labs  Lab 11/17/20 1618 11/18/20 0545 11/19/20 0507  WBC 5.9 4.1 4.0  NEUTROABS 4.5  --   --   HGB 12.0* 10.6* 11.1*  HCT 38.3* 33.6* 35.2*  MCV 100.8* 98.8 99.7  PLT 265 223 [id number]    Basic Metabolic Panel: Recent Labs  Lab 11/17/20 1618 11/18/20 0545 11/19/20 0507 11/21/20 0403  NA 139 141 138 138  K 4.1 3.6 3.3* 3.0*  CL 100 101 97* 99  CO2 25 26 26 31   GLUCOSE 105* 82  116* 107*  BUN 48* 34* 25* 19  CREATININE 1.77* 1.19 1.22 1.21  CALCIUM 10.3 9.3 9.4 9.1  MG  --  1.8  --   --   PHOS  --  1.6*  --  2.6     Liver Function Tests: Recent Labs  Lab 11/17/20 1618 11/19/20 0507  AST 45* 44*  ALT 32 29  ALKPHOS 49 41  BILITOT 1.4* 1.6*  PROT 8.8* 8.3*  ALBUMIN 4.3 3.8     Antibiotics: Anti-infectives (From admission, onward)   Start     Dose/Rate Route Frequency Ordered Stop   11/19/20 1600  dolutegravir (TIVICAY) tablet 50 mg  Status:  Discontinued        50 mg Oral Daily 11/19/20 1418 11/19/20 1448   11/19/20 1600  abacavir-dolutegravir-lamiVUDine (TRIUMEQ) 600-50-300 MG per tablet 1 tablet        1 tablet Oral Daily 11/19/20 1435         DVT prophylaxis: SCDs  Code Status: DNR  Family Communication: No family at bedside   Consultants:  ENT  Procedures:      Objective   Vitals:   11/20/20 1354 11/20/20 2013 11/21/20 0408 11/21/20 1322  BP: 126/83 126/80 105/68 118/71  Pulse: 94 78 79 80  Resp: 14 17 17 16   Temp: 98.3 F (36.8 C) 98.2 F (36.8 C) 98.2 F (36.8 C) 98.6 F (37 C)  TempSrc:  Oral Oral Oral  SpO2: 93% 95% 95% 95%  Weight:      Height:        Intake/Output Summary (Last 24 hours) at 11/21/2020 1408 Last data filed at 11/21/2020 1000 Gross per 24 hour  Intake 749.09 ml  Output 1450 ml  Net -700.91 ml    12/30 1901 - 01/01 0700 In: 1038.9 [P.O.:220; I.V.:818.9] Out: 2075 [Urine:2075]  Filed Weights   11/17/20 1608  Weight: 57.3 kg    Physical Examination:   General-appears in no acute distress  Heart-S1-S2, regular, no murmur auscultated  Lungs-clear to auscultation bilaterally, no wheezing or crackles auscultated  Abdomen-soft, nontender, no organomegaly  Extremities-no edema in the lower extremities  Neuro-alert, oriented x3, no focal deficit noted   Status is: Inpatient  Dispo: The patient is from: Home              Anticipated d/c is to: Home              Anticipated d/c  date is: 11/24/2020              Patient currently not medically stable for discharge  Barrier to discharge-odynophagia, dysphagia, plan for G-tube placement on 11/23/2020       Data Reviewed:   Recent Results (from the past 240 hour(s))  Resp Panel by RT-PCR (Flu A&B, Covid) Nasopharyngeal Swab     Status: None   Collection Time: 11/17/20  9:10 PM   Specimen: Nasopharyngeal Swab; Nasopharyngeal(NP) swabs in vial transport medium  Result Value Ref Range Status   SARS Coronavirus 2 by RT PCR NEGATIVE NEGATIVE Final    Comment: (NOTE) SARS-CoV-2 target nucleic acids are NOT DETECTED.  The SARS-CoV-2 RNA is generally  detectable in upper respiratory specimens during the acute phase of infection. The lowest concentration of SARS-CoV-2 viral copies this assay can detect is 138 copies/mL. A negative result does not preclude SARS-Cov-2 infection and should not be used as the sole basis for treatment or other patient management decisions. A negative result may occur with  improper specimen collection/handling, submission of specimen other than nasopharyngeal swab, presence of viral mutation(s) within the areas targeted by this assay, and inadequate number of viral copies(<138 copies/mL). A negative result must be combined with clinical observations, patient history, and epidemiological information. The expected result is Negative.  Fact Sheet for Patients:  EntrepreneurPulse.com.au  Fact Sheet for Healthcare Providers:  IncredibleEmployment.be  This test is no t yet approved or cleared by the [place] FDA and  has been authorized for detection and/or diagnosis of SARS-CoV-2 by FDA under an Emergency Use Authorization (EUA). This EUA will remain  in effect (meaning this test can be used) for the duration of the COVID-19 declaration under Section 564(b)(1) of the Act, 21 U.S.C.section 360bbb-3(b)(1), unless the authorization is terminated  or  revoked sooner.       Influenza A by PCR NEGATIVE NEGATIVE Final   Influenza B by PCR NEGATIVE NEGATIVE Final    Comment: (NOTE) The Xpert Xpress SARS-CoV-2/FLU/RSV plus assay is intended as an aid in the diagnosis of influenza from Nasopharyngeal swab specimens and should not be used as a sole basis for treatment. Nasal washings and aspirates are unacceptable for Xpert Xpress SARS-CoV-2/FLU/RSV testing.  Fact Sheet for Patients: EntrepreneurPulse.com.au  Fact Sheet for Healthcare Providers: IncredibleEmployment.be  This test is not yet approved or cleared by the [place] FDA and has been authorized for detection and/or diagnosis of SARS-CoV-2 by FDA under an Emergency Use Authorization (EUA). This EUA will remain in effect (meaning this test can be used) for the duration of the COVID-19 declaration under Section 564(b)(1) of the Act, 21 U.S.C. section 360bbb-3(b)(1), unless the authorization is terminated or revoked.  Performed at [facility], [place] [address] [employer], [place] [address]      Studies:  CT ABDOMEN WO CONTRAST  Result Date: 11/20/2020 CLINICAL DATA:  Difficulty swallowing, dysphagia and history of head and neck carcinoma with prior gastrostomy tube placement in 2011. Evaluation for possible gastrostomy tube placement. EGD yesterday demonstrated normal stomach. EXAM: CT ABDOMEN WITHOUT CONTRAST TECHNIQUE: Multidetector CT imaging of the abdomen was performed following the standard protocol without IV contrast. COMPARISON:  Right upper quadrant abdominal ultrasound of 04/13/2020 FINDINGS: Lower chest: Mild atelectasis at the posterior right lung base. Hepatobiliary: Unremarkable unenhanced appearance of the liver. Multiple calcified gallstones in a nondistended gallbladder. No biliary ductal dilatation. Pancreas: Unremarkable. No pancreatic ductal dilatation or surrounding inflammatory changes. Spleen:  Moderate splenomegaly. Adrenals/Urinary Tract: No adrenal masses. No hydronephrosis or visible renal calculi. Benign cyst of the lower left kidney measures approximately 2.2 cm in greatest diameter. Stomach/Bowel: No hiatal hernia. The stomach is under distended and normal in position. The stomach lie superior to the transverse colon. No evidence of bowel obstruction, ileus or inflammation. No free intraperitoneal air. Vascular/Lymphatic: Aortic atherosclerosis without aneurysm. No enlarged abdominal lymph nodes. Other: No abdominal wall hernia, ascites, abnormal fluid collections or body wall edema. Musculoskeletal: No acute or significant osseous findings. IMPRESSION: 1. No acute findings in the abdomen or pelvis. 2. Normal gastric positioning and no prohibitive anatomy to percutaneous gastrostomy tube placement. 3. Moderate splenomegaly. 4. Cholelithiasis. 5. Aortic atherosclerosis. Aortic Atherosclerosis (ICD10-I70.0). Electronically Signed  By: Irish Lack M.D.   On: 11/20/2020 12:07       Meredeth Ide   [facility] If 7PM-7AM, please contact night-coverage at [email or web address], Office  [phone]   11/21/2020, 2:08 PM  LOS: 4 days

## 2020-11-22 DIAGNOSIS — N179 Acute kidney failure, unspecified: Secondary | ICD-10-CM | POA: Diagnosis not present

## 2020-11-22 DIAGNOSIS — E1159 Type 2 diabetes mellitus with other circulatory complications: Secondary | ICD-10-CM | POA: Diagnosis not present

## 2020-11-22 DIAGNOSIS — R1310 Dysphagia, unspecified: Secondary | ICD-10-CM | POA: Diagnosis not present

## 2020-11-22 DIAGNOSIS — E43 Unspecified severe protein-calorie malnutrition: Secondary | ICD-10-CM

## 2020-11-22 DIAGNOSIS — B20 Human immunodeficiency virus [HIV] disease: Secondary | ICD-10-CM | POA: Diagnosis not present

## 2020-11-22 LAB — GLUCOSE, CAPILLARY
Glucose-Capillary: 110 mg/dL — ABNORMAL HIGH (ref 70–99)
Glucose-Capillary: 114 mg/dL — ABNORMAL HIGH (ref 70–99)
Glucose-Capillary: 120 mg/dL — ABNORMAL HIGH (ref 70–99)
Glucose-Capillary: 126 mg/dL — ABNORMAL HIGH (ref 70–99)
Glucose-Capillary: 128 mg/dL — ABNORMAL HIGH (ref 70–99)
Glucose-Capillary: 129 mg/dL — ABNORMAL HIGH (ref 70–99)

## 2020-11-22 LAB — COMPREHENSIVE METABOLIC PANEL
ALT: 30 U/L (ref 0–44)
AST: 52 U/L — ABNORMAL HIGH (ref 15–41)
Albumin: 3 g/dL — ABNORMAL LOW (ref 3.5–5.0)
Alkaline Phosphatase: 49 U/L (ref 38–126)
Anion gap: 9 (ref 5–15)
BUN: 15 mg/dL (ref 8–23)
CO2: 28 mmol/L (ref 22–32)
Calcium: 8.7 mg/dL — ABNORMAL LOW (ref 8.9–10.3)
Chloride: 99 mmol/L (ref 98–111)
Creatinine, Ser: 1.14 mg/dL (ref 0.61–1.24)
GFR, Estimated: >60 mL/min (ref >60)
Glucose, Bld: 122 mg/dL — ABNORMAL HIGH (ref 70–99)
Potassium: 2.7 mmol/L — CL (ref 3.5–5.1)
Sodium: 136 mmol/L (ref 135–145)
Total Bilirubin: 1.2 mg/dL (ref 0.3–1.2)
Total Protein: 6.8 g/dL (ref 6.5–8.1)

## 2020-11-22 LAB — PHOSPHORUS: Phosphorus: 2.1 mg/dL — ABNORMAL LOW (ref 2.5–4.6)

## 2020-11-22 MED ORDER — KCL IN DEXTROSE-NACL 40-5-0.9 MEQ/L-%-% IV SOLN
INTRAVENOUS | Status: DC
  Filled 2020-11-22 (×4): qty 1000

## 2020-11-22 MED ORDER — POTASSIUM CHLORIDE 10 MEQ/100ML IV SOLN
10.0000 meq | INTRAVENOUS | Status: AC
  Administered 2020-11-22 (×5): 10 meq via INTRAVENOUS
  Filled 2020-11-22 (×4): qty 100

## 2020-11-22 NOTE — Progress Notes (Signed)
____________________________________________________________  Attending physician addendum:  Thank you for sending this case to me. I have reviewed the entire note and agree with the plan.  Update:  ENT did not find recurrent tonsillar cancer, subsequent EGD with mild esophageal stricture but mainly severe mucositis.   Gastrostomy tube is planned.  Amada Jupiter, MD  ____________________________________________________________

## 2020-11-22 NOTE — Progress Notes (Signed)
Patient ID: Christopher [name], male   DOB: 1951-10-05, 70 y.o.   MRN: [id number]  PROGRESS NOTE    JEMELLE JANSMA  [id number] DOB: 06-16-1951 DOA: 11/17/2020 PCP: Fanny Bien, MD   Brief Narrative:  70 year old male with history of right tonsillar cancer, status post surgery, radiation, chemotherapy also has history of chronic dysphagia, esophageal stricture, HIV, CAD, CKD stage III, diabetes mellitus type 2, hypertension, hyperlipidemia, hypothyroidism, carotid artery stenosis s/p bilateral revascularization/stenting, depression/anxiety presented with worsening odynophagia, dysphagia along with coughing up blood.  She underwent EGD which showed mild benign-appearing esophageal stenosis and edema in the upper esophagus from XRT with severe nasopharyngeal mucositis.  IR was consulted for G-tube placement.  Assessment & Plan:   Hemoptysis versus hematemesis History of tonsillar cancer status post chemo XRT Dysphagia -Evaluated by ENT, no obvious source of bleeding noted on laryngoscopy -Status post EGD which showed only oropharyngeal mucositis with stable esophageal stenosis.  GI recommended PEG tube placement. -Patient has been off Plavix since December 26.  IR planning for G-tube placement on 11/23/2020. -Continue IV Protonix  Acute kidney injury on chronic kidney disease stage IIIa Dehydration/poor oral intake -Renal function improved after hydration.  Continue gentle hydration till patient starts tube feeding.  HIV -Continue antiretroviral therapy.  Outpatient follow-up with ID  Hypokalemia--continue replacement.  Add supplemental potassium with IV fluids.  Repeat a.m. labs.  Diabetes mellitus type 2 -Continue CBGs with SSI.  Hypertension -Blood pressure stable on the lower side.  Off benazepril.  Continue metoprolol  CAD -No chest pain.  Aspirin/Plavix/statin on hold for now  Carotid artery stenosis status post bilateral revascularization/stent placement -Underwent  right-sided revascularization on 05/10/2020 and left-sided revascularization in 06/2020 by Dr. Oneida Alar.  On aspirin/Plavix indefinitely as an outpatient.  Continue holding till PEG placement  Anemia of chronic disease: From renal disease.  Hemoglobin stable.  Generalized deconditioning Severe malnutrition -Nutrition following.  Will request PT eval   DVT prophylaxis: SCDs Code Status: DNR Family Communication: None at bedside Disposition Plan: Status is: Inpatient  Remains inpatient appropriate because:Inpatient level of care appropriate due to severity of illness   Dispo: The patient is from: Home              Anticipated d/c is to: Home              Anticipated d/c date is: > 3 days              Patient currently is not medically stable to d/c.  Consultants: ENT/GI/IR  Procedures: EGD  Antimicrobials:  Anti-infectives (From admission, onward)   Start     Dose/Rate Route Frequency Ordered Stop   11/19/20 1600  dolutegravir (TIVICAY) tablet 50 mg  Status:  Discontinued        50 mg Oral Daily 11/19/20 1418 11/19/20 1448   11/19/20 1600  abacavir-dolutegravir-lamiVUDine (TRIUMEQ) 600-50-300 MG per tablet 1 tablet        1 tablet Oral Daily 11/19/20 1435         Subjective: Patient seen and examined at bedside.  Poor historian.  Denies any worsening shortness of breath, nausea, vomiting or fever.  No chest pain.  Objective: Vitals:   11/21/20 0408 11/21/20 1322 11/21/20 2115 11/22/20 0623  BP: 105/68 118/71 125/79 115/69  Pulse: 79 80 82 71  Resp: 17 16 17 17   Temp: 98.2 F (36.8 C) 98.6 F (37 C) 98.4 F (36.9 C) 98.5 F (36.9 C)  TempSrc: Oral Oral    SpO2:  95% 95% 94% 95%  Weight:      Height:        Intake/Output Summary (Last 24 hours) at 11/22/2020 0901 Last data filed at 11/22/2020 0600 Gross per 24 hour  Intake 2288.45 ml  Output 1875 ml  Net 413.45 ml   Filed Weights   11/17/20 1608  Weight: 57.3 kg    Examination:  General exam: Appears calm  and comfortable.  Looks chronically ill and very thinly built/cachectic Respiratory system: Bilateral decreased breath sounds at bases with some scattered crackles Cardiovascular system: S1 & S2 heard, Rate controlled Gastrointestinal system: Abdomen is nondistended, soft and nontender. Normal bowel sounds heard. Extremities: No cyanosis, clubbing, edema  Central nervous system: Alert and oriented. No focal neurological deficits. Moving extremities Skin: No rashes, lesions or ulcers Psychiatry: Flat affect   Data Reviewed: I have personally reviewed following labs and imaging studies  CBC: Recent Labs  Lab 11/17/20 1618 11/18/20 0545 11/19/20 0507  WBC 5.9 4.1 4.0  NEUTROABS 4.5  --   --   HGB 12.0* 10.6* 11.1*  HCT 38.3* 33.6* 35.2*  MCV 100.8* 98.8 99.7  PLT 265 223 [id number]   Basic Metabolic Panel: Recent Labs  Lab 11/17/20 1618 11/18/20 0545 11/19/20 0507 11/21/20 0403 11/22/20 0506  NA 139 141 138 138 136  K 4.1 3.6 3.3* 3.0* 2.7*  CL 100 101 97* 99 99  CO2 25 26 26 31 28   GLUCOSE 105* 82 116* 107* 122*  BUN 48* 34* 25* 19 15  CREATININE 1.77* 1.19 1.22 1.21 1.14  CALCIUM 10.3 9.3 9.4 9.1 8.7*  MG  --  1.8  --   --   --   PHOS  --  1.6*  --  2.6 2.1*   GFR: Estimated Creatinine Clearance: 48.9 mL/min (by C-G formula based on SCr of 1.14 mg/dL). Liver Function Tests: Recent Labs  Lab 11/17/20 1618 11/19/20 0507 11/22/20 0506  AST 45* 44* 52*  ALT 32 29 30  ALKPHOS 49 41 49  BILITOT 1.4* 1.6* 1.2  PROT 8.8* 8.3* 6.8  ALBUMIN 4.3 3.8 3.0*   No results for input(s): LIPASE, AMYLASE in the last 168 hours. No results for input(s): AMMONIA in the last 168 hours. Coagulation Profile: Recent Labs  Lab 11/19/20 0507  INR 1.2   Cardiac Enzymes: No results for input(s): CKTOTAL, CKMB, CKMBINDEX, TROPONINI in the last 168 hours. BNP (last 3 results) No results for input(s): PROBNP in the last 8760 hours. HbA1C: No results for input(s): HGBA1C in the last 72  hours. CBG: Recent Labs  Lab 11/21/20 0751 11/21/20 1605 11/22/20 0011 11/22/20 0413 11/22/20 0808  GLUCAP 118* 145* 110* 120* 129*   Lipid Profile: No results for input(s): CHOL, HDL, LDLCALC, TRIG, CHOLHDL, LDLDIRECT in the last 72 hours. Thyroid Function Tests: No results for input(s): TSH, T4TOTAL, FREET4, T3FREE, THYROIDAB in the last 72 hours. Anemia Panel: No results for input(s): VITAMINB12, FOLATE, FERRITIN, TIBC, IRON, RETICCTPCT in the last 72 hours. Sepsis Labs: Recent Labs  Lab 11/17/20 1618 11/17/20 2010  LATICACIDVEN 0.8 0.5    Recent Results (from the past 240 hour(s))  Resp Panel by RT-PCR (Flu A&B, Covid) Nasopharyngeal Swab     Status: None   Collection Time: 11/17/20  9:10 PM   Specimen: Nasopharyngeal Swab; Nasopharyngeal(NP) swabs in vial transport medium  Result Value Ref Range Status   SARS Coronavirus 2 by RT PCR NEGATIVE NEGATIVE Final    Comment: (NOTE) SARS-CoV-2 target nucleic acids are NOT DETECTED.  The SARS-CoV-2 RNA is generally detectable in upper respiratory specimens during the acute phase of infection. The lowest concentration of SARS-CoV-2 viral copies this assay can detect is 138 copies/mL. A negative result does not preclude SARS-Cov-2 infection and should not be used as the sole basis for treatment or other patient management decisions. A negative result may occur with  improper specimen collection/handling, submission of specimen other than nasopharyngeal swab, presence of viral mutation(s) within the areas targeted by this assay, and inadequate number of viral copies(<138 copies/mL). A negative result must be combined with clinical observations, patient history, and epidemiological information. The expected result is Negative.  Fact Sheet for Patients:  BloggerCourse.com  Fact Sheet for Healthcare Providers:  SeriousBroker.it  This test is no t yet approved or cleared by  the [place] FDA and  has been authorized for detection and/or diagnosis of SARS-CoV-2 by FDA under an Emergency Use Authorization (EUA). This EUA will remain  in effect (meaning this test can be used) for the duration of the COVID-19 declaration under Section 564(b)(1) of the Act, 21 U.S.C.section 360bbb-3(b)(1), unless the authorization is terminated  or revoked sooner.       Influenza A by PCR NEGATIVE NEGATIVE Final   Influenza B by PCR NEGATIVE NEGATIVE Final    Comment: (NOTE) The Xpert Xpress SARS-CoV-2/FLU/RSV plus assay is intended as an aid in the diagnosis of influenza from Nasopharyngeal swab specimens and should not be used as a sole basis for treatment. Nasal washings and aspirates are unacceptable for Xpert Xpress SARS-CoV-2/FLU/RSV testing.  Fact Sheet for Patients: BloggerCourse.com  Fact Sheet for Healthcare Providers: SeriousBroker.it  This test is not yet approved or cleared by the [place] FDA and has been authorized for detection and/or diagnosis of SARS-CoV-2 by FDA under an Emergency Use Authorization (EUA). This EUA will remain in effect (meaning this test can be used) for the duration of the COVID-19 declaration under Section 564(b)(1) of the Act, 21 U.S.C. section 360bbb-3(b)(1), unless the authorization is terminated or revoked.  Performed at [facility], [address], [place], [place] [address]          Radiology Studies: CT ABDOMEN WO CONTRAST  Result Date: 11/20/2020 CLINICAL DATA:  Difficulty swallowing, dysphagia and history of head and neck carcinoma with prior gastrostomy tube placement in 2011. Evaluation for possible gastrostomy tube placement. EGD yesterday demonstrated normal stomach. EXAM: CT ABDOMEN WITHOUT CONTRAST TECHNIQUE: Multidetector CT imaging of the abdomen was performed following the standard protocol without IV contrast. COMPARISON:  Right  upper quadrant abdominal ultrasound of 04/13/2020 FINDINGS: Lower chest: Mild atelectasis at the posterior right lung base. Hepatobiliary: Unremarkable unenhanced appearance of the liver. Multiple calcified gallstones in a nondistended gallbladder. No biliary ductal dilatation. Pancreas: Unremarkable. No pancreatic ductal dilatation or surrounding inflammatory changes. Spleen: Moderate splenomegaly. Adrenals/Urinary Tract: No adrenal masses. No hydronephrosis or visible renal calculi. Benign cyst of the lower left kidney measures approximately 2.2 cm in greatest diameter. Stomach/Bowel: No hiatal hernia. The stomach is under distended and normal in position. The stomach lie superior to the transverse colon. No evidence of bowel obstruction, ileus or inflammation. No free intraperitoneal air. Vascular/Lymphatic: Aortic atherosclerosis without aneurysm. No enlarged abdominal lymph nodes. Other: No abdominal wall hernia, ascites, abnormal fluid collections or body wall edema. Musculoskeletal: No acute or significant osseous findings. IMPRESSION: 1. No acute findings in the abdomen or pelvis. 2. Normal gastric positioning and no prohibitive anatomy to percutaneous gastrostomy tube placement. 3. Moderate splenomegaly. 4. Cholelithiasis. 5.  Aortic atherosclerosis. Aortic Atherosclerosis (ICD10-I70.0). Electronically Signed   By: Aletta Edouard M.D.   On: 11/20/2020 12:07        Scheduled Meds: . abacavir-dolutegravir-lamiVUDine  1 tablet Oral Daily  . FLUoxetine  20 mg Oral Daily  . insulin aspart  0-9 Units Subcutaneous Q4H  . levothyroxine  75 mcg Oral QAC breakfast  . magic mouthwash w/lidocaine  10 mL Oral TID AC & HS  . metoprolol succinate  12.5 mg Oral Daily  . pantoprazole (PROTONIX) IV  40 mg Intravenous Q12H  . phosphorus  250 mg Oral TID  . temazepam  30 mg Oral QHS   Continuous Infusions: . sodium chloride 1,000 mL (11/18/20 1707)  . dextrose 5 % and 0.9 % NaCl with KCl 40 mEq/L    .  potassium chloride 10 mEq (11/22/20 0744)          Aline August, MD [facility] 11/22/2020, 9:01 AM

## 2020-11-23 ENCOUNTER — Inpatient Hospital Stay (HOSPITAL_COMMUNITY): Payer: No Typology Code available for payment source

## 2020-11-23 DIAGNOSIS — E1169 Type 2 diabetes mellitus with other specified complication: Secondary | ICD-10-CM | POA: Diagnosis not present

## 2020-11-23 DIAGNOSIS — R1310 Dysphagia, unspecified: Secondary | ICD-10-CM | POA: Diagnosis not present

## 2020-11-23 DIAGNOSIS — B20 Human immunodeficiency virus [HIV] disease: Secondary | ICD-10-CM | POA: Diagnosis not present

## 2020-11-23 DIAGNOSIS — N179 Acute kidney failure, unspecified: Secondary | ICD-10-CM | POA: Diagnosis not present

## 2020-11-23 HISTORY — PX: IR GASTROSTOMY TUBE MOD SED: IMG625

## 2020-11-23 LAB — MAGNESIUM: Magnesium: 1.8 mg/dL (ref 1.7–2.4)

## 2020-11-23 LAB — BASIC METABOLIC PANEL
Anion gap: 6 (ref 5–15)
BUN: 10 mg/dL (ref 8–23)
CO2: 27 mmol/L (ref 22–32)
Calcium: 9.1 mg/dL (ref 8.9–10.3)
Chloride: 106 mmol/L (ref 98–111)
Creatinine, Ser: 1 mg/dL (ref 0.61–1.24)
GFR, Estimated: >60 mL/min (ref >60)
Glucose, Bld: 120 mg/dL — ABNORMAL HIGH (ref 70–99)
Potassium: 4 mmol/L (ref 3.5–5.1)
Sodium: 139 mmol/L (ref 135–145)

## 2020-11-23 LAB — SURGICAL PCR SCREEN
MRSA, PCR: NEGATIVE
Staphylococcus aureus: POSITIVE — AB

## 2020-11-23 MED ORDER — CEFAZOLIN SODIUM-DEXTROSE 2-4 GM/100ML-% IV SOLN
INTRAVENOUS | Status: AC
  Administered 2020-11-23: 13:00:00 2 g via INTRAVENOUS
  Filled 2020-11-23: qty 100

## 2020-11-23 MED ORDER — MIDAZOLAM HCL 2 MG/2ML IJ SOLN
INTRAMUSCULAR | Status: AC
  Filled 2020-11-23: qty 4

## 2020-11-23 MED ORDER — LIDOCAINE HCL 1 % IJ SOLN
INTRAMUSCULAR | Status: AC
  Filled 2020-11-23: qty 20

## 2020-11-23 MED ORDER — FENTANYL CITRATE (PF) 100 MCG/2ML IJ SOLN
INTRAMUSCULAR | Status: AC
  Filled 2020-11-23: qty 2

## 2020-11-23 MED ORDER — LIDOCAINE HCL (PF) 1 % IJ SOLN
INTRAMUSCULAR | Status: AC | PRN
  Administered 2020-11-23: 10 mL via INTRADERMAL

## 2020-11-23 MED ORDER — MIDAZOLAM HCL 2 MG/2ML IJ SOLN
INTRAMUSCULAR | Status: AC | PRN
  Administered 2020-11-23 (×2): 1 mg via INTRAVENOUS

## 2020-11-23 MED ORDER — FENTANYL CITRATE (PF) 100 MCG/2ML IJ SOLN
INTRAMUSCULAR | Status: AC | PRN
  Administered 2020-11-23 (×2): 50 ug via INTRAVENOUS

## 2020-11-23 MED ORDER — MUPIROCIN 2 % EX OINT
1.0000 "application " | TOPICAL_OINTMENT | Freq: Two times a day (BID) | CUTANEOUS | Status: DC
  Administered 2020-11-23 – 2020-11-27 (×9): 1 via NASAL
  Filled 2020-11-23: qty 22

## 2020-11-23 MED ORDER — CEFAZOLIN SODIUM-DEXTROSE 2-4 GM/100ML-% IV SOLN: 2.0000 g | Freq: Once | INTRAVENOUS | Status: AC

## 2020-11-23 MED ORDER — GLUCAGON HCL RDNA (DIAGNOSTIC) 1 MG IJ SOLR
INTRAMUSCULAR | Status: AC
  Filled 2020-11-23: qty 1

## 2020-11-23 MED ORDER — GLUCAGON HCL (RDNA) 1 MG IJ SOLR
INTRAMUSCULAR | Status: AC | PRN
  Administered 2020-11-23: 1 mg via INTRAVENOUS

## 2020-11-23 MED ORDER — CHLORHEXIDINE GLUCONATE CLOTH 2 % EX PADS
6.0000 | MEDICATED_PAD | Freq: Every day | CUTANEOUS | Status: DC
  Administered 2020-11-23 – 2020-11-27 (×4): 6 via TOPICAL

## 2020-11-23 NOTE — Evaluation (Signed)
PT Cancellation Note  Patient Details Name: Christopher [name] MRN: [id number] DOB: 1951/05/17   Cancelled Treatment:    Reason Eval/Treat Not Completed: Patient at procedure or test/unavailable. G-tube placement today, will hold PT eval.    Tori Anola Mcgough PT, DPT 11/23/20, 8:12 AM

## 2020-11-23 NOTE — Care Management Important Message (Signed)
Important Message  Patient Details IM Letter given to the Patient. Name: ANHAD SHEELEY MRN: [id number] Date of Birth: [birth year]   Medicare Important Message Given:  Yes     Caren Macadam 11/23/2020, 3:00 PM

## 2020-11-23 NOTE — Evaluation (Signed)
Physical Therapy Evaluation Patient Details Name: Christopher [name] MRN: [id number] DOB: 1951-10-26 Today's Date: 11/23/2020   History of Present Illness  70 year old male with medical history of right tonsillar cancer status post surgery, radiation and chemotherapy, chronic dysphagia, esophageal stricture, HIV, CAD, CKD stage III, diabetes mellitus type 2, HTN, hypothyroidism, carotid artery stenosis s/p bilateral revascularization/stenting, depression/anxiety presented with worsening odynophagia, dysphagia along with coughing up blood.  Clinical Impression  Pt admitted with above diagnosis. Pt slightly unsteady upon rising, denies dizziness, requiring IV pole to steady self and min G to rise and take a few steps in room. Pt requiring increased time and cues with bed mobility, but no physical assist. Pt support to have G-tube placement today. PTA, pt completely independent and lives at home with spouse who works during the day. Pt's goal to return home without HHPT, but PT recommending HHPT due to weakness and fatigue during eval, could progress acutely and not require. Pt currently with functional limitations due to the deficits listed below (see PT Problem List). Pt will benefit from skilled PT to increase their independence and safety with mobility to allow discharge to the venue listed below.       Follow Up Recommendations Home health PT;Supervision - Intermittent (possibly none if progresses well)    Equipment Recommendations  None recommended by PT    Recommendations for Other Services       Precautions / Restrictions Precautions Precautions: Fall Restrictions Weight Bearing Restrictions: No      Mobility  Bed Mobility Overal bed mobility: Needs Assistance Bed Mobility: Supine to Sit  Supine to sit: Supervision;HOB elevated    General bed mobility comments: slow mobility with use of bedrail and elevated HOB, cues for hand placement to assist in scooting out to EOB     Transfers Overall transfer level: Needs assistance Equipment used: None Transfers: Sit to/from Stand Sit to Stand: Min guard    General transfer comment: min guard steadying assist to rise from EOB, cues to shift weight forward upon rising, provided IV pole once upright standing  Ambulation/Gait Ambulation/Gait assistance: Min guard Gait Distance (Feet): 5 Feet Assistive device: IV Pole Gait Pattern/deviations: Step-to pattern;Decreased stride length Gait velocity: decreased   General Gait Details: slow, short steps in room, IV pole assisting pt with steadiness, able to clear bil feet, limited due to NPO causing mouth dry  Stairs            Wheelchair Mobility    Modified Rankin (Stroke Patients Only)       Balance Overall balance assessment: Needs assistance Sitting-balance support: Feet supported;No upper extremity supported Sitting balance-Leahy Scale: Fair Sitting balance - Comments: seated EOB with occasional posterior lean, able to correct with cues   Standing balance support: During functional activity;Single extremity supported Standing balance-Leahy Scale: Fair Standing balance comment: static without UE support, dynamic with IV pole steadying                Pertinent Vitals/Pain Pain Assessment: No/denies pain    Home Living Family/patient expects to be discharged to:: Private residence Living Arrangements: Spouse/significant other Available Help at Discharge: Family;Available PRN/intermittently Type of Home: House Home Access: Stairs to enter   Entrance Stairs-Number of Steps: 1 Home Layout: Two level;Able to live on main level with bedroom/bathroom Home Equipment: Kasandra Knudsen - single point      Prior Function Level of Independence: Independent;Independent with assistive device(s)         Comments: Pt reports independent with ADLs, ambulates community  distances without AD, drives, no falls. Spouse completes household chores.     Hand  Dominance   Dominant Hand: Right    Extremity/Trunk Assessment   Upper Extremity Assessment Upper Extremity Assessment: Overall WFL for tasks assessed    Lower Extremity Assessment Lower Extremity Assessment: Overall WFL for tasks assessed (AROM WNL, strength 4/5 BLE, reports "a little" numbness/tingling throughout bil lower leg and feet that is symmetrical)    Cervical / Trunk Assessment Cervical / Trunk Assessment: Normal  Communication   Communication: No difficulties  Cognition Arousal/Alertness: Awake/alert Behavior During Therapy: WFL for tasks assessed/performed Overall Cognitive Status: Within Functional Limits for tasks assessed             General Comments      Exercises     Assessment/Plan    PT Assessment Patient needs continued PT services  PT Problem List Decreased activity tolerance;Decreased balance;Impaired sensation       PT Treatment Interventions DME instruction;Gait training;Functional mobility training;Therapeutic activities;Therapeutic exercise;Balance training;Patient/family education    PT Goals (Current goals can be found in the Care Plan section)  Acute Rehab PT Goals Patient Stated Goal: return home with spouse to assist, no HHPT PT Goal Formulation: With patient Time For Goal Achievement: 12/07/20 Potential to Achieve Goals: Good    Frequency Min 3X/week   Barriers to discharge        Co-evaluation               AM-PAC PT "6 Clicks" Mobility  Outcome Measure Help needed turning from your back to your side while in a flat bed without using bedrails?: None Help needed moving from lying on your back to sitting on the side of a flat bed without using bedrails?: None Help needed moving to and from a bed to a chair (including a wheelchair)?: A Little Help needed standing up from a chair using your arms (e.g., wheelchair or bedside chair)?: A Little Help needed to walk in hospital room?: A Little Help needed climbing 3-5 steps  with a railing? : A Lot 6 Click Score: 19    End of Session Equipment Utilized During Treatment: Gait belt Activity Tolerance: Other (comment) (mouth dry) Patient left: in chair;with call bell/phone within reach;with nursing/sitter in room Nurse Communication: Mobility status;Other (comment) (mouth dry, procedure) PT Visit Diagnosis: Unsteadiness on feet (R26.81);Other abnormalities of gait and mobility (R26.89)    Time: 7793-9030 PT Time Calculation (min) (ACUTE ONLY): 19 min   Charges:   PT Evaluation $PT Eval Low Complexity: 1 Low           Tori Jalila Goodnough PT, DPT 11/23/20, 10:56 AM

## 2020-11-23 NOTE — Progress Notes (Signed)
Patient ID: Christopher [name], male   DOB: 07-Aug-1951, 70 y.o.   MRN: [id number]  PROGRESS NOTE    Christopher [name]  [id number] DOB: 07-Aug-1951 DOA: 11/17/2020 PCP: Lewis Moccasinewey, [name] R, MD   Brief Narrative:  70 year old male with history of right tonsillar cancer, status post surgery, radiation, chemotherapy also has history of chronic dysphagia, esophageal stricture, HIV, CAD, CKD stage III, diabetes mellitus type 2, hypertension, hyperlipidemia, hypothyroidism, carotid artery stenosis s/p bilateral revascularization/stenting, depression/anxiety presented with worsening odynophagia, dysphagia along with coughing up blood.  She underwent EGD which showed mild benign-appearing esophageal stenosis and edema in the upper esophagus from XRT with severe nasopharyngeal mucositis.  IR was consulted for G-tube placement.  Assessment & Plan:   Hemoptysis versus hematemesis History of tonsillar cancer status post chemo XRT Dysphagia -Evaluated by ENT, no obvious source of bleeding noted on laryngoscopy -Status post EGD which showed only oropharyngeal mucositis with stable esophageal stenosis.  GI recommended PEG tube placement. -Patient has been off Plavix since December 26.  IR planning for G-tube placement today on 11/23/2020. -Continue IV Protonix  Acute kidney injury on chronic kidney disease stage IIIa Dehydration/poor oral intake -Renal function improved after hydration.  Continue gentle hydration till patient starts tube feeding.  HIV -Continue antiretroviral therapy.  Outpatient follow-up with ID  Hypokalemia--continue replacement.  Add supplemental potassium with IV fluids.  Repeat a.m. labs.  Diabetes mellitus type 2 -Continue CBGs with SSI.  Hypertension -Blood pressure stable on the lower side.  Off benazepril.  Continue metoprolol  CAD -No chest pain.  Aspirin/Plavix/statin on hold for now  Carotid artery stenosis status post bilateral revascularization/stent  placement -Underwent right-sided revascularization on 05/10/2020 and left-sided revascularization in 06/2020 by Dr. Darrick PennaFields.  On aspirin/Plavix indefinitely as an outpatient.  Continue holding till PEG placement  Anemia of chronic disease: From renal disease.  Hemoglobin stable.  Generalized deconditioning Severe malnutrition -Nutrition following.  PT eval   DVT prophylaxis: SCDs Code Status: DNR Family Communication: None at bedside Disposition Plan: Status is: Inpatient  Remains inpatient appropriate because:Inpatient level of care appropriate due to severity of illness   Dispo: The patient is from: Home              Anticipated d/c is to: Home              Anticipated d/c date is: 2 days              Patient currently is not medically stable to d/c.  Consultants: ENT/GI/IR  Procedures: EGD  Antimicrobials:  Anti-infectives (From admission, onward)   Start     Dose/Rate Route Frequency Ordered Stop   11/19/20 1600  dolutegravir (TIVICAY) tablet 50 mg  Status:  Discontinued        50 mg Oral Daily 11/19/20 1418 11/19/20 1448   11/19/20 1600  abacavir-dolutegravir-lamiVUDine (TRIUMEQ) 600-50-300 MG per tablet 1 tablet        1 tablet Oral Daily 11/19/20 1435         Subjective: Patient seen and examined at bedside.  Denies overnight fever, nausea, vomiting or worsening shortness of breath.  Poor historian.  Objective: Vitals:   11/22/20 0623 11/22/20 1404 11/22/20 1956 11/23/20 0509  BP: 115/69 116/70 137/77 134/73  Pulse: 71 74 79 82  Resp: 17 17 17 16   Temp: 98.5 F (36.9 C) 98.1 F (36.7 C) 98.3 F (36.8 C) 98.2 F (36.8 C)  TempSrc:  Oral Oral Oral  SpO2: 95% 94% 96% 95%  Weight:  Height:        Intake/Output Summary (Last 24 hours) at 11/23/2020 0748 Last data filed at 11/23/2020 0600 Gross per 24 hour  Intake 2668.42 ml  Output 1900 ml  Net 768.42 ml   Filed Weights   11/17/20 1608  Weight: 57.3 kg    Examination:  General exam: No acute  distress.  Looks chronically ill and very thinly built/cachectic Respiratory system: Decreased breath sounds at bases bilaterally, no wheezing cardiovascular system: Rate controlled, S1-S2 heard Gastrointestinal system: Abdomen is nondistended, soft and nontender.  Bowel sounds are heard  extremities: No edema or clubbing   Data Reviewed: I have personally reviewed following labs and imaging studies  CBC: Recent Labs  Lab 11/17/20 1618 11/18/20 0545 11/19/20 0507  WBC 5.9 4.1 4.0  NEUTROABS 4.5  --   --   HGB 12.0* 10.6* 11.1*  HCT 38.3* 33.6* 35.2*  MCV 100.8* 98.8 99.7  PLT 265 223 [id number]   Basic Metabolic Panel: Recent Labs  Lab 11/18/20 0545 11/19/20 0507 11/21/20 0403 11/22/20 0506 11/23/20 0545  NA 141 138 138 136 139  K 3.6 3.3* 3.0* 2.7* 4.0  CL 101 97* 99 99 106  CO2 26 26 31 28 27   GLUCOSE 82 116* 107* 122* 120*  BUN 34* 25* 19 15 10   CREATININE 1.19 1.22 1.21 1.14 1.00  CALCIUM 9.3 9.4 9.1 8.7* 9.1  MG 1.8  --   --   --  1.8  PHOS 1.6*  --  2.6 2.1*  --    GFR: Estimated Creatinine Clearance: 55.7 mL/min (by C-G formula based on SCr of 1 mg/dL). Liver Function Tests: Recent Labs  Lab 11/17/20 1618 11/19/20 0507 11/22/20 0506  AST 45* 44* 52*  ALT 32 29 30  ALKPHOS 49 41 49  BILITOT 1.4* 1.6* 1.2  PROT 8.8* 8.3* 6.8  ALBUMIN 4.3 3.8 3.0*   No results for input(s): LIPASE, AMYLASE in the last 168 hours. No results for input(s): AMMONIA in the last 168 hours. Coagulation Profile: Recent Labs  Lab 11/19/20 0507  INR 1.2   Cardiac Enzymes: No results for input(s): CKTOTAL, CKMB, CKMBINDEX, TROPONINI in the last 168 hours. BNP (last 3 results) No results for input(s): PROBNP in the last 8760 hours. HbA1C: No results for input(s): HGBA1C in the last 72 hours. CBG: Recent Labs  Lab 11/22/20 0413 11/22/20 0808 11/22/20 1154 11/22/20 1607 11/22/20 1958  GLUCAP 120* 129* 126* 114* 128*   Lipid Profile: No results for input(s): CHOL, HDL,  LDLCALC, TRIG, CHOLHDL, LDLDIRECT in the last 72 hours. Thyroid Function Tests: No results for input(s): TSH, T4TOTAL, FREET4, T3FREE, THYROIDAB in the last 72 hours. Anemia Panel: No results for input(s): VITAMINB12, FOLATE, FERRITIN, TIBC, IRON, RETICCTPCT in the last 72 hours. Sepsis Labs: Recent Labs  Lab 11/17/20 1618 11/17/20 2010  LATICACIDVEN 0.8 0.5    Recent Results (from the past 240 hour(s))  Resp Panel by RT-PCR (Flu A&B, Covid) Nasopharyngeal Swab     Status: None   Collection Time: 11/17/20  9:10 PM   Specimen: Nasopharyngeal Swab; Nasopharyngeal(NP) swabs in vial transport medium  Result Value Ref Range Status   SARS Coronavirus 2 by RT PCR NEGATIVE NEGATIVE Final    Comment: (NOTE) SARS-CoV-2 target nucleic acids are NOT DETECTED.  The SARS-CoV-2 RNA is generally detectable in upper respiratory specimens during the acute phase of infection. The lowest concentration of SARS-CoV-2 viral copies this assay can detect is 138 copies/mL. A negative result does not preclude SARS-Cov-2  infection and should not be used as the sole basis for treatment or other patient management decisions. A negative result may occur with  improper specimen collection/handling, submission of specimen other than nasopharyngeal swab, presence of viral mutation(s) within the areas targeted by this assay, and inadequate number of viral copies(<138 copies/mL). A negative result must be combined with clinical observations, patient history, and epidemiological information. The expected result is Negative.  Fact Sheet for Patients:  BloggerCourse.com  Fact Sheet for Healthcare Providers:  SeriousBroker.it  This test is no t yet approved or cleared by the [place] FDA and  has been authorized for detection and/or diagnosis of SARS-CoV-2 by FDA under an Emergency Use Authorization (EUA). This EUA will remain  in effect (meaning this test  can be used) for the duration of the COVID-19 declaration under Section 564(b)(1) of the Act, 21 U.S.C.section 360bbb-3(b)(1), unless the authorization is terminated  or revoked sooner.       Influenza A by PCR NEGATIVE NEGATIVE Final   Influenza B by PCR NEGATIVE NEGATIVE Final    Comment: (NOTE) The Xpert Xpress SARS-CoV-2/FLU/RSV plus assay is intended as an aid in the diagnosis of influenza from Nasopharyngeal swab specimens and should not be used as a sole basis for treatment. Nasal washings and aspirates are unacceptable for Xpert Xpress SARS-CoV-2/FLU/RSV testing.  Fact Sheet for Patients: BloggerCourse.com  Fact Sheet for Healthcare Providers: SeriousBroker.it  This test is not yet approved or cleared by the [place] FDA and has been authorized for detection and/or diagnosis of SARS-CoV-2 by FDA under an Emergency Use Authorization (EUA). This EUA will remain in effect (meaning this test can be used) for the duration of the COVID-19 declaration under Section 564(b)(1) of the Act, 21 U.S.C. section 360bbb-3(b)(1), unless the authorization is terminated or revoked.  Performed at [facility], [address], [place], [place] [address]          Radiology Studies: No results found.      Scheduled Meds: . abacavir-dolutegravir-lamiVUDine  1 tablet Oral Daily  . FLUoxetine  20 mg Oral Daily  . insulin aspart  0-9 Units Subcutaneous Q4H  . levothyroxine  75 mcg Oral QAC breakfast  . magic mouthwash w/lidocaine  10 mL Oral TID AC & HS  . metoprolol succinate  12.5 mg Oral Daily  . pantoprazole (PROTONIX) IV  40 mg Intravenous Q12H  . temazepam  30 mg Oral QHS   Continuous Infusions: . sodium chloride 1,000 mL (11/18/20 1707)  . dextrose 5 % and 0.9 % NaCl with KCl 40 mEq/L 50 mL/hr at 11/22/20 2340          Glade Lloyd, MD [facility] 11/23/2020, 7:48 AM

## 2020-11-23 NOTE — Procedures (Signed)
Pre procedure Dx: Dysphagia Post Procedure Dx: Same  Successful fluoroscopic guided insertion of gastrostomy tube.   The gastrostomy tube may be used immediately for medications.   Tube feeds may be initiated in 24 hours as per the primary team.    EBL: Trace  Complications: None immediate  Jay Ranay Ketter, MD Pager #: [phone]     

## 2020-11-24 DIAGNOSIS — E43 Unspecified severe protein-calorie malnutrition: Secondary | ICD-10-CM | POA: Diagnosis not present

## 2020-11-24 LAB — GLUCOSE, CAPILLARY
Glucose-Capillary: 111 mg/dL — ABNORMAL HIGH (ref 70–99)
Glucose-Capillary: 132 mg/dL — ABNORMAL HIGH (ref 70–99)
Glucose-Capillary: 109 mg/dL — ABNORMAL HIGH (ref 70–99)

## 2020-11-24 LAB — MAGNESIUM: Magnesium: 1.9 mg/dL (ref 1.7–2.4)

## 2020-11-24 LAB — PHOSPHORUS: Phosphorus: 1.6 mg/dL — ABNORMAL LOW (ref 2.5–4.6)

## 2020-11-24 MED ORDER — OSMOLITE 1.2 CAL PO LIQD
1000.0000 mL | ORAL | Status: DC
  Administered 2020-11-24 – 2020-11-27 (×3): 1000 mL
  Filled 2020-11-24 (×7): qty 1000

## 2020-11-24 MED ORDER — FREE WATER
100.0000 mL | Freq: Four times a day (QID) | Status: DC
  Administered 2020-11-24 – 2020-11-27 (×12): 100 mL

## 2020-11-24 MED ORDER — VITAL HIGH PROTEIN PO LIQD: 1000.0000 mL | ORAL | Status: DC

## 2020-11-24 NOTE — Progress Notes (Addendum)
Nutrition Follow-up  DOCUMENTATION CODES:   Severe malnutrition in context of chronic illness  INTERVENTION:  - will order Osmolite 1.5 @ 20 ml/hr to advance by 10 ml/hr every 12 hours to reach goal rate of 70 ml/hr with 100 ml free water QID. - at goal rate, this regimen will provide 2016 kcal, 93 grams protein, and 1778 ml free water.   Monitor magnesium, potassium, and phosphorus daily for at least 3 days, MD to replete as needed, as pt is at risk for refeeding syndrome given severe PCM.   Bolus TF regimen for d/c: - 1 carton (237 ml) Osmolite 1.5 x6/day with 60 ml free water before and 60 ml after each TF bolus. - this regimen will provide 2130 kcal, 89 grams protein, and 1806 ml free water.    NUTRITION DIAGNOSIS:   Severe Malnutrition related to chronic illness,dysphagia (HIV) as evidenced by percent weight loss,energy intake < or equal to 75% for > or equal to 1 month,severe fat depletion,severe muscle depletion. -ongoing  GOAL:   Patient will meet greater than or equal to 90% of their needs -unmet at this time.   MONITOR:   PO intake,Diet advancement,TF tolerance,Labs,Weight trends  REASON FOR ASSESSMENT:   Consult Enteral/tube feeding initiation and management  ASSESSMENT:   70 year old male with history of right tonsillar cancer, status post surgery, radiation, chemotherapy also has history of chronic dysphagia, esophageal stricture, HIV, CAD, CKD stage III, diabetes mellitus type 2, hypertension, hyperlipidemia, hypothyroidism, carotid artery stenosis s/p bilateral revascularization/stenting, depression/anxiety came to ED with worsening odynophagia, dysphagia also coughing up blood.  Diet changed from NPO to CLD on 12/30 at 1150, back to NPO 1/3 at midnight, and re-advanced to CLD 1/3 at 1420. Per flow sheet documentation, recent intakes were 25% of dinner on 12/30; 60% of dinner on 1/1; 35% of breakfast and 25% of lunch today.   PEG placed 1/3 afternoon. Secure  chat received from MD that TF is able to be initiated today. Orders and recommendations outlined above.   Patient has not been weighed since admission date of 12/28. No information documented in the edema section of flow sheet.   Patient is from home and plan at time of d/c is to return home, per MD notes.   Labs reviewed; CBGs: 111, 132, 109 mg/dl. Medications reviewed; sliding scale novolog, 75 mcg oral synthroid/day, 40 mg IV protonix BID.    Diet Order:   Diet Order            Diet clear liquid Room service appropriate? Yes; Fluid consistency: Thin  Diet effective now                 EDUCATION NEEDS:   Education needs have been addressed  Skin:  Skin Assessment: Reviewed RN Assessment  Last BM:  1/2 (type 7)  Height:   Ht Readings from Last 1 Encounters:  11/17/20 5' 8.5" (1.74 m)    Weight:   Wt Readings from Last 1 Encounters:  11/17/20 57.3 kg     Estimated Nutritional Needs:  Kcal:  2000-2200 Protein:  95-110g Fluid:  2L/day     Trenton Gammon, MS, RD, LDN, CNSC Inpatient Clinical Dietitian RD pager # available in [facility]  After hours/weekend pager # available in [facility]

## 2020-11-24 NOTE — Hospital Course (Addendum)
Christopher [name] is a 70 year old male with history of right tonsillar cancer, status post surgery, radiation, chemotherapy also has history of chronic dysphagia, esophageal stricture, HIV, CAD, CKD stage III, diabetes mellitus type 2, hypertension, hyperlipidemia, hypothyroidism, carotid artery stenosis s/p bilateral revascularization/stenting, depression/anxiety presented with worsening odynophagia, dysphagia along with coughing up blood.   He underwent EGD which showed mild benign-appearing esophageal stenosis and edema in the upper esophagus from XRT with severe nasopharyngeal mucositis.  IR was consulted for G-tube placement which was performed on 11/23/20.  He was started on tube feeds on 11/24/2020. He also underwent repeat SLP evaluation with plans for modified barium swallow on 11/26/2020.  Study revealed poor laryngeal vestibule closure.  He tolerates thin and nectar thick liquids but does not tolerate solid foods due to retention in the throat.  He was recommended to continue on long-term nutrition via tube feeds with pleasure feeding orally with thin or nectar thick liquids.  These recommendations were discussed with patient and his significant other.  He was arranged for ongoing tube feeds at discharge.

## 2020-11-24 NOTE — Progress Notes (Signed)
Referring Physician(s): Gupta,R  Supervising Physician: Ruthann Cancer  Patient Status:  [facility] - In-pt  Chief Complaint: Tonsillar cancer, dysphagia  Subjective: Pt sore at G tube site as expected; denies N/V   Allergies: Codeine, Sulfamethoxazole-trimethoprim, and Sulfonamide derivatives  Medications: Prior to Admission medications   Medication Sig Start Date End Date Taking? Authorizing Provider  aspirin EC 81 MG EC tablet Take 1 tablet (81 mg total) by mouth daily. 04/17/20  Yes Ghimire, Henreitta Leber, MD  benazepril (LOTENSIN) 10 MG tablet Take 10 mg by mouth daily.    Yes [provider]  clonazePAM (KLONOPIN) 1 MG tablet TAKE 1 TABLET 3 TIMES A DAY AS NEEDED FOR ANXIETY Patient taking differently: Take 1 mg by mouth 3 (three) times daily as needed for anxiety.   Yes Michel Bickers, MD  clopidogrel (PLAVIX) 75 MG tablet Take 1 tablet (75 mg total) by mouth daily. 04/17/20  Yes Ghimire, Henreitta Leber, MD  diphenhydrAMINE (BENADRYL) 25 MG tablet Take 50 mg by mouth at bedtime as needed for sleep.   Yes [provider]  fenofibrate (TRICOR) 145 MG tablet TAKE ONE TABLET BY MOUTH DAILY Patient taking differently: Take 145 mg by mouth daily. 05/04/20  Yes Michel Bickers, MD  FLUoxetine (PROZAC) 20 MG capsule TAKE ONE CAPSULE BY MOUTH DAILY Patient taking differently: Take 20 mg by mouth daily. 07/06/20  Yes Michel Bickers, MD  folic acid (FOLVITE) 1 MG tablet Take 2 tablets (2 mg total) by mouth daily. 04/17/20  Yes Ghimire, Henreitta Leber, MD  HUMALOG KWIKPEN 100 UNIT/ML KwikPen Inject 1-15 Units into the skin 3 (three) times daily as needed (BS). Per sliding scale 04/16/20  Yes [provider]  insulin glargine (LANTUS) 100 UNIT/ML Solostar Pen Inject 26 Units into the skin daily at 10 pm. Patient taking differently: Inject 20 Units into the skin daily at 10 pm. 04/16/20  Yes Ghimire, Henreitta Leber, MD  levothyroxine (SYNTHROID) 75 MCG tablet Take 75 mcg by mouth daily  before breakfast. 08/24/20  Yes [provider]  metoprolol succinate (TOPROL-XL) 25 MG 24 hr tablet Take 1 tablet (25 mg total) by mouth daily. Patient taking differently: Take 12.5 mg by mouth daily. 04/17/20  Yes Ghimire, Henreitta Leber, MD  rosuvastatin (CRESTOR) 20 MG tablet Take 1 tablet (20 mg total) by mouth daily. 04/17/20  Yes Ghimire, Henreitta Leber, MD  sertraline (ZOLOFT) 100 MG tablet Take 100 mg by mouth daily. 09/14/20  Yes [provider]  temazepam (RESTORIL) 30 MG capsule Take 30 mg by mouth at bedtime. 07/26/16  Yes [provider]  R[place] 600-50-300 MG tablet TAKE ONE TABLET BY MOUTH DAILY Patient taking differently: Take 1 tablet by mouth daily. 12/23/19  Yes Michel Bickers, MD  blood glucose meter kit and supplies Dispense based on patient and insurance preference. Use up to four times daily as directed. (FOR ICD-10 E10.9, E11.9). 04/16/20   Ghimire, Henreitta Leber, MD  CONTOUR NEXT TEST test strip  06/01/20   [provider]  Insulin Pen Needle 32G X 8 MM MISC Use as directed 04/16/20   Jonetta Osgood, MD  Dolutegravir Sodium (TIVICAY) 50 MG TABS Take 1 tablet (50 mg total) by mouth daily. 12/05/12 12/05/28  Truman Hayward, MD     Vital Signs: BP 137/81 (BP Location: Right Arm)   Pulse 80   Temp 98 F (36.7 C)   Resp 18   Ht 5' 8.5" (1.74 m)   Wt 126 lb 6 oz (57.3  kg)   SpO2 97%   BMI 18.94 kg/m   Physical Exam awake/alert; G tube intact, insertion site clean and dry, mild- mod tender, no abd distension or bleeding at site  Imaging: CT ABDOMEN WO CONTRAST  Result Date: 11/20/2020 CLINICAL DATA:  Difficulty swallowing, dysphagia and history of head and neck carcinoma with prior gastrostomy tube placement in 2011. Evaluation for possible gastrostomy tube placement. EGD yesterday demonstrated normal stomach. EXAM: CT ABDOMEN WITHOUT CONTRAST TECHNIQUE: Multidetector CT imaging of the abdomen was performed following the standard protocol  without IV contrast. COMPARISON:  Right upper quadrant abdominal ultrasound of 04/13/2020 FINDINGS: Lower chest: Mild atelectasis at the posterior right lung base. Hepatobiliary: Unremarkable unenhanced appearance of the liver. Multiple calcified gallstones in a nondistended gallbladder. No biliary ductal dilatation. Pancreas: Unremarkable. No pancreatic ductal dilatation or surrounding inflammatory changes. Spleen: Moderate splenomegaly. Adrenals/Urinary Tract: No adrenal masses. No hydronephrosis or visible renal calculi. Benign cyst of the lower left kidney measures approximately 2.2 cm in greatest diameter. Stomach/Bowel: No hiatal hernia. The stomach is under distended and normal in position. The stomach lie superior to the transverse colon. No evidence of bowel obstruction, ileus or inflammation. No free intraperitoneal air. Vascular/Lymphatic: Aortic atherosclerosis without aneurysm. No enlarged abdominal lymph nodes. Other: No abdominal wall hernia, ascites, abnormal fluid collections or body wall edema. Musculoskeletal: No acute or significant osseous findings. IMPRESSION: 1. No acute findings in the abdomen or pelvis. 2. Normal gastric positioning and no prohibitive anatomy to percutaneous gastrostomy tube placement. 3. Moderate splenomegaly. 4. Cholelithiasis. 5. Aortic atherosclerosis. Aortic Atherosclerosis (ICD10-I70.0). Electronically Signed   By: Aletta Edouard M.D.   On: 11/20/2020 12:07   IR GASTROSTOMY TUBE MOD SED  Result Date: 11/23/2020 INDICATION: History of head neck cancer, now with recurrent dysphagia. Please perform percutaneous gastrostomy tube placement for enteric nutrition supplementation purposes. Note, patient with history of previous gastrostomy tube placed 07/07/2010, removed on 11/19/2010 following completion of chemoradiation. EXAM: PULL TROUGH GASTROSTOMY TUBE PLACEMENT COMPARISON:  CT abdomen pelvis-11/20/2020; percutaneous gastrostomy tube placement-07/07/2010 MEDICATIONS:  Ancef 2 gm IV; Antibiotics were administered within 1 hour of the procedure. Glucagon 1 mg IV CONTRAST:  20 mL of Omnipaque 300 administered into the gastric lumen. ANESTHESIA/SEDATION: Moderate (conscious) sedation was employed during this procedure. A total of Versed 2 mg and Fentanyl 100 mcg was administered intravenously. Moderate Sedation Time: 10 minutes. The patient's level of consciousness and vital signs were monitored continuously by radiology nursing throughout the procedure under my direct supervision. FLUOROSCOPY TIME:  1 minute, 18 seconds (9 mGy) COMPLICATIONS: None immediate. PROCEDURE: Informed written consent was obtained from the patient following explanation of the procedure, risks, benefits and alternatives. A time out was performed prior to the initiation of the procedure. Ultrasound scanning was performed to demarcate the edge of the left lobe of the liver. Maximal barrier sterile technique utilized including caps, mask, sterile gowns, sterile gloves, large sterile drape, hand hygiene and Betadine prep. The left upper quadrant was sterilely prepped and draped. An oral gastric catheter was inserted into the stomach under fluoroscopy. The left costal margin and air opacified transverse colon were identified and avoided. Air was injected into the stomach for insufflation and visualization under fluoroscopy. Under sterile conditions a 17 gauge trocar needle was utilized to access the stomach percutaneously beneath the left subcostal margin at the location of previous gastrostomy tube after the overlying soft tissues were anesthetized with 1% Lidocaine with epinephrine. Needle position was confirmed within the stomach with aspiration of air and injection  of small amount of contrast. A single T tack was deployed for gastropexy. Over an Amplatz guide wire, a 9-French sheath was inserted into the stomach. A snare device was utilized to capture the oral gastric catheter. The snare device was pulled  retrograde from the stomach up the esophagus and out the oropharynx. The 20-French pull-through gastrostomy was connected to the snare device and pulled antegrade through the oropharynx down the esophagus into the stomach and then through the percutaneous tract external to the patient. The gastrostomy was assembled externally. Contrast injection confirms position in the stomach. Several spot radiographic images were obtained in various obliquities for documentation. The patient tolerated procedure well without immediate post procedural complication. FINDINGS: After successful fluoroscopic guided placement, the gastrostomy tube is appropriately positioned with internal disc against the ventral aspect of the gastric lumen. IMPRESSION: Successful fluoroscopic insertion of a 20-French pull-through gastrostomy tube. The gastrostomy may be used immediately for medication administration and in 24 hrs for the initiation of feeds. Electronically Signed   By: Sandi Mariscal M.D.   On: 11/23/2020 14:03    Labs:  CBC: Recent Labs    06/23/20 0615 11/17/20 1618 11/18/20 0545 11/19/20 0507  WBC 3.8* 5.9 4.1 4.0  HGB 9.4* 12.0* 10.6* 11.1*  HCT 29.8* 38.3* 33.6* 35.2*  PLT 149* 265 223 214    COAGS: Recent Labs    05/13/20 1210 06/17/20 1137 11/19/20 0507  INR 1.1 1.2 1.2  APTT 45* 39*  --     BMP: Recent Labs    05/13/20 1210 05/19/20 0323 06/17/20 1137 06/23/20 0615 11/17/20 1618 11/19/20 0507 11/21/20 0403 11/22/20 0506 11/23/20 0545  NA 139 135 139 136   < > 138 138 136 139  K 4.5 3.6 3.9 3.3*   < > 3.3* 3.0* 2.7* 4.0  CL 104 102 104 102   < > 97* 99 99 106  CO2 25 22 24 26    < > 26 31 28 27   GLUCOSE 163* 122* 125* 118*   < > 116* 107* 122* 120*  BUN 23 22 13 11    < > 25* 19 15 10   CALCIUM 10.4* 9.4 9.3 9.2   < > 9.4 9.1 8.7* 9.1  CREATININE 1.47* 1.28* 1.29* 1.23   < > 1.22 1.21 1.14 1.00  GFRNONAA 48* 56* 56* 59*   < > >60 >60 >60 >60  GFRAA 55* >60 >60 >60  --   --   --   --    --    < > = values in this interval not displayed.    LIVER FUNCTION TESTS: Recent Labs    06/17/20 1137 11/17/20 1618 11/19/20 0507 11/22/20 0506  BILITOT 0.6 1.4* 1.6* 1.2  AST 38 45* 44* 52*  ALT 22 32 29 30  ALKPHOS 44 49 41 49  PROT 7.8 8.8* 8.3* 6.8  ALBUMIN 4.0 4.3 3.8 3.0*    Assessment and Plan: Pt with hx tonsillar cancer /dysphagia/malnutrition; s/p perc G tube 1/3; afebrile; no new labs today; site appears stable; ok to use tube as needed   Electronically Signed: D. Rowe Robert, PA-C 11/24/2020, 11:10 AM   I spent a total of 15 minutes at the the patient's bedside AND on the patient's hospital floor or unit, greater than 50% of which was counseling/coordinating care for gastrostomy tube    Patient ID: Christopher [name], male   DOB: 1950/11/22, 70 y.o.   MRN: [id number]

## 2020-11-24 NOTE — Progress Notes (Signed)
PROGRESS NOTE    Christopher [name]   [id number]  DOB: Mar 07, 1951  DOA: 11/17/2020     7  PCP: Fanny Bien, MD  CC: dysphagia  Hospital Course: Mr. Kiessling is a 70 year old male with history of right tonsillar cancer, status post surgery, radiation, chemotherapy also has history of chronic dysphagia, esophageal stricture, HIV, CAD, CKD stage III, diabetes mellitus type 2, hypertension, hyperlipidemia, hypothyroidism, carotid artery stenosis s/p bilateral revascularization/stenting, depression/anxiety presented with worsening odynophagia, dysphagia along with coughing up blood.   He underwent EGD which showed mild benign-appearing esophageal stenosis and edema in the upper esophagus from XRT with severe nasopharyngeal mucositis.  IR was consulted for G-tube placement which was performed on 11/23/20.    Interval History:  No events overnight.  Resting in bed this morning in no distress.  Has some mild abdominal soreness from procedure yesterday but nothing unexpected.  He is wanting to go home at time of discharge and is also declining home health PT even.  Old records reviewed in assessment of this patient  ROS: Constitutional: negative for chills and fevers, Respiratory: negative for cough, Cardiovascular: negative for chest pain and Gastrointestinal: negative for abdominal pain  Assessment & Plan: Hemoptysis versus hematemesis History of tonsillar cancer status post chemo XRT Dysphagia -Evaluated by ENT, no obvious source of bleeding noted on laryngoscopy -Status post EGD which showed only oropharyngeal mucositis with stable esophageal stenosis.  GI recommended PEG tube placement. -Patient has been off Plavix since December 26.  IR G-tube placement on 11/23/2020. -Continue IV Protonix -RD consult to start on tube feeds today, 11/24/2020  Acute kidney injury on chronic kidney disease stage IIIa Dehydration/poor oral intake -Renal function improved after hydration.   -Tube  feeds starting today, okay to DC IV fluids  HIV -Continue antiretroviral therapy.  Outpatient follow-up with ID  Hypokalemia -Replete and recheck as needed   Diabetes mellitus type 2 -Continue CBGs with SSI.  Hypertension -Blood pressure stable on the lower side.  Off benazepril.  Continue metoprolol  CAD -No chest pain.  Aspirin/Plavix/statin on hold for now  Carotid artery stenosis status post bilateral revascularization/stent placement -Underwent right-sided revascularization on 05/10/2020 and left-sided revascularization in 06/2020 by Dr. Oneida Alar.  On aspirin/Plavix indefinitely as an outpatient.  Continue holding till PEG placement  Anemia of chronic disease: From renal disease.  Hemoglobin stable.  Generalized deconditioning Severe malnutrition -Nutrition following.  PT eval. HHPT recommended but patient is declining  - see dysphagia  Antimicrobials: n/a  DVT prophylaxis: SCD Code Status: DNR Family Communication: none present Disposition Plan: Status is: Inpatient  Remains inpatient appropriate because:Unsafe d/c plan, IV treatments appropriate due to intensity of illness or inability to take PO and Inpatient level of care appropriate due to severity of illness   Dispo: The patient is from: Home              Anticipated d/c is to: Home              Anticipated d/c date is: 1 day              Patient currently is not medically stable to d/c.       Objective: Blood pressure 126/77, pulse 80, temperature 98.2 F (36.8 C), temperature source Oral, resp. rate 17, height 5' 8.5" (1.74 m), weight 57.3 kg, SpO2 98 %.  Examination: General appearance: alert, cooperative and no distress Head: Normocephalic, without obvious abnormality, atraumatic Eyes: EOMI Lungs: clear to auscultation bilaterally Heart: regular rate and  rhythm and S1, S2 normal Abdomen: soft, NT, ND, BS present; G tube in place Extremities: no edema Skin: mobility and turgor  normal Neurologic: Grossly normal  Consultants:   IR  Procedures:   PEG placement in IR 11/23/20  Data Reviewed: I have personally reviewed following labs and imaging studies Results for orders placed or performed during the hospital encounter of 11/17/20 (from the past 24 hour(s))  Glucose, capillary     Status: Abnormal   Collection Time: 11/24/20  4:03 AM  Result Value Ref Range   Glucose-Capillary 111 (H) 70 - 99 mg/dL  Glucose, capillary     Status: Abnormal   Collection Time: 11/24/20 11:36 AM  Result Value Ref Range   Glucose-Capillary 132 (H) 70 - 99 mg/dL    Recent Results (from the past 240 hour(s))  Resp Panel by RT-PCR (Flu A&B, Covid) Nasopharyngeal Swab     Status: None   Collection Time: 11/17/20  9:10 PM   Specimen: Nasopharyngeal Swab; Nasopharyngeal(NP) swabs in vial transport medium  Result Value Ref Range Status   SARS Coronavirus 2 by RT PCR NEGATIVE NEGATIVE Final    Comment: (NOTE) SARS-CoV-2 target nucleic acids are NOT DETECTED.  The SARS-CoV-2 RNA is generally detectable in upper respiratory specimens during the acute phase of infection. The lowest concentration of SARS-CoV-2 viral copies this assay can detect is 138 copies/mL. A negative result does not preclude SARS-Cov-2 infection and should not be used as the sole basis for treatment or other patient management decisions. A negative result may occur with  improper specimen collection/handling, submission of specimen other than nasopharyngeal swab, presence of viral mutation(s) within the areas targeted by this assay, and inadequate number of viral copies(<138 copies/mL). A negative result must be combined with clinical observations, patient history, and epidemiological information. The expected result is Negative.  Fact Sheet for Patients:  BloggerCourse.com  Fact Sheet for Healthcare Providers:  SeriousBroker.it  This test is no t yet  approved or cleared by the [place] FDA and  has been authorized for detection and/or diagnosis of SARS-CoV-2 by FDA under an Emergency Use Authorization (EUA). This EUA will remain  in effect (meaning this test can be used) for the duration of the COVID-19 declaration under Section 564(b)(1) of the Act, 21 U.S.C.section 360bbb-3(b)(1), unless the authorization is terminated  or revoked sooner.       Influenza A by PCR NEGATIVE NEGATIVE Final   Influenza B by PCR NEGATIVE NEGATIVE Final    Comment: (NOTE) The Xpert Xpress SARS-CoV-2/FLU/RSV plus assay is intended as an aid in the diagnosis of influenza from Nasopharyngeal swab specimens and should not be used as a sole basis for treatment. Nasal washings and aspirates are unacceptable for Xpert Xpress SARS-CoV-2/FLU/RSV testing.  Fact Sheet for Patients: BloggerCourse.com  Fact Sheet for Healthcare Providers: SeriousBroker.it  This test is not yet approved or cleared by the [place] FDA and has been authorized for detection and/or diagnosis of SARS-CoV-2 by FDA under an Emergency Use Authorization (EUA). This EUA will remain in effect (meaning this test can be used) for the duration of the COVID-19 declaration under Section 564(b)(1) of the Act, 21 U.S.C. section 360bbb-3(b)(1), unless the authorization is terminated or revoked.  Performed at [facility], [address],  [place], [place] [address]   Surgical pcr screen     Status: Abnormal   Collection Time: 11/23/20  5:50 AM   Specimen: Nasal Mucosa; Nasal Swab  Result Value Ref Range Status  MRSA, PCR NEGATIVE NEGATIVE Final   Staphylococcus aureus POSITIVE (A) NEGATIVE Final    Comment: (NOTE) The Xpert SA Assay (FDA approved for NASAL specimens in patients 39 years of age and older), is one component of a comprehensive surveillance program. It is not intended to diagnose infection nor  to guide or monitor treatment. Performed at [facility], [place] [address], [place], [place] [address]      Radiology Studies: IR GASTROSTOMY TUBE MOD SED  Result Date: 11/23/2020 INDICATION: History of head neck cancer, now with recurrent dysphagia. Please perform percutaneous gastrostomy tube placement for enteric nutrition supplementation purposes. Note, patient with history of previous gastrostomy tube placed 07/07/2010, removed on 11/19/2010 following completion of chemoradiation. EXAM: PULL TROUGH GASTROSTOMY TUBE PLACEMENT COMPARISON:  CT abdomen pelvis-11/20/2020; percutaneous gastrostomy tube placement-07/07/2010 MEDICATIONS: Ancef 2 gm IV; Antibiotics were administered within 1 hour of the procedure. Glucagon 1 mg IV CONTRAST:  20 mL of Omnipaque 300 administered into the gastric lumen. ANESTHESIA/SEDATION: Moderate (conscious) sedation was employed during this procedure. A total of Versed 2 mg and Fentanyl 100 mcg was administered intravenously. Moderate Sedation Time: 10 minutes. The patient's level of consciousness and vital signs were monitored continuously by radiology nursing throughout the procedure under my direct supervision. FLUOROSCOPY TIME:  1 minute, 18 seconds (9 mGy) COMPLICATIONS: None immediate. PROCEDURE: Informed written consent was obtained from the patient following explanation of the procedure, risks, benefits and alternatives. A time out was performed prior to the initiation of the procedure. Ultrasound scanning was performed to demarcate the edge of the left lobe of the liver. Maximal barrier sterile technique utilized including caps, mask, sterile gowns, sterile gloves, large sterile drape, hand hygiene and Betadine prep. The left upper quadrant was sterilely prepped and draped. An oral gastric catheter was inserted into the stomach under fluoroscopy. The left costal margin and air opacified transverse colon were identified and avoided. Air was injected  into the stomach for insufflation and visualization under fluoroscopy. Under sterile conditions a 17 gauge trocar needle was utilized to access the stomach percutaneously beneath the left subcostal margin at the location of previous gastrostomy tube after the overlying soft tissues were anesthetized with 1% Lidocaine with epinephrine. Needle position was confirmed within the stomach with aspiration of air and injection of small amount of contrast. A single T tack was deployed for gastropexy. Over an Amplatz guide wire, a 9-French sheath was inserted into the stomach. A snare device was utilized to capture the oral gastric catheter. The snare device was pulled retrograde from the stomach up the esophagus and out the oropharynx. The 20-French pull-through gastrostomy was connected to the snare device and pulled antegrade through the oropharynx down the esophagus into the stomach and then through the percutaneous tract external to the patient. The gastrostomy was assembled externally. Contrast injection confirms position in the stomach. Several spot radiographic images were obtained in various obliquities for documentation. The patient tolerated procedure well without immediate post procedural complication. FINDINGS: After successful fluoroscopic guided placement, the gastrostomy tube is appropriately positioned with internal disc against the ventral aspect of the gastric lumen. IMPRESSION: Successful fluoroscopic insertion of a 20-French pull-through gastrostomy tube. The gastrostomy may be used immediately for medication administration and in 24 hrs for the initiation of feeds. Electronically Signed   By: Sandi Mariscal M.D.   On: 11/23/2020 14:03   IR GASTROSTOMY TUBE MOD SED  Final Result    CT ABDOMEN WO CONTRAST  Final Result    CT Soft  Tissue Neck W Contrast  Final Result      Scheduled Meds: . abacavir-dolutegravir-lamiVUDine  1 tablet Oral Daily  . Chlorhexidine Gluconate Cloth  6 each Topical  Q0600  . FLUoxetine  20 mg Oral Daily  . insulin aspart  0-9 Units Subcutaneous Q4H  . levothyroxine  75 mcg Oral QAC breakfast  . magic mouthwash w/lidocaine  10 mL Oral TID AC & HS  . metoprolol succinate  12.5 mg Oral Daily  . mupirocin ointment  1 application Nasal BID  . pantoprazole (PROTONIX) IV  40 mg Intravenous Q12H  . temazepam  30 mg Oral QHS   PRN Meds: sodium chloride, clonazePAM, morphine injection, ondansetron **OR** ondansetron (ZOFRAN) IV Continuous Infusions: . sodium chloride 1,000 mL (11/18/20 1707)  . dextrose 5 % and 0.9 % NaCl with KCl 40 mEq/L 50 mL/hr at 11/24/20 0859     LOS: 7 days  Time spent: Greater than 50% of the 35 minute visit was spent in counseling/coordination of care for the patient as laid out in the A&P.   Dwyane Dee, MD [facility] 11/24/2020, 2:18 PM

## 2020-11-25 DIAGNOSIS — R1310 Dysphagia, unspecified: Secondary | ICD-10-CM | POA: Diagnosis not present

## 2020-11-25 LAB — GLUCOSE, CAPILLARY
Glucose-Capillary: 101 mg/dL — ABNORMAL HIGH (ref 70–99)
Glucose-Capillary: 102 mg/dL — ABNORMAL HIGH (ref 70–99)
Glucose-Capillary: 107 mg/dL — ABNORMAL HIGH (ref 70–99)
Glucose-Capillary: 115 mg/dL — ABNORMAL HIGH (ref 70–99)
Glucose-Capillary: 115 mg/dL — ABNORMAL HIGH (ref 70–99)
Glucose-Capillary: 115 mg/dL — ABNORMAL HIGH (ref 70–99)
Glucose-Capillary: 118 mg/dL — ABNORMAL HIGH (ref 70–99)
Glucose-Capillary: 118 mg/dL — ABNORMAL HIGH (ref 70–99)
Glucose-Capillary: 121 mg/dL — ABNORMAL HIGH (ref 70–99)
Glucose-Capillary: 125 mg/dL — ABNORMAL HIGH (ref 70–99)
Glucose-Capillary: 129 mg/dL — ABNORMAL HIGH (ref 70–99)
Glucose-Capillary: 130 mg/dL — ABNORMAL HIGH (ref 70–99)
Glucose-Capillary: 136 mg/dL — ABNORMAL HIGH (ref 70–99)
Glucose-Capillary: 142 mg/dL — ABNORMAL HIGH (ref 70–99)
Glucose-Capillary: 143 mg/dL — ABNORMAL HIGH (ref 70–99)
Glucose-Capillary: 146 mg/dL — ABNORMAL HIGH (ref 70–99)
Glucose-Capillary: 151 mg/dL — ABNORMAL HIGH (ref 70–99)
Glucose-Capillary: 97 mg/dL (ref 70–99)
Glucose-Capillary: 97 mg/dL (ref 70–99)
Glucose-Capillary: 98 mg/dL (ref 70–99)
Glucose-Capillary: 99 mg/dL (ref 70–99)

## 2020-11-25 LAB — BASIC METABOLIC PANEL
Anion gap: 9 (ref 5–15)
BUN: 14 mg/dL (ref 8–23)
CO2: 26 mmol/L (ref 22–32)
Calcium: 9.5 mg/dL (ref 8.9–10.3)
Chloride: 100 mmol/L (ref 98–111)
Creatinine, Ser: 1.01 mg/dL (ref 0.61–1.24)
GFR, Estimated: >60 mL/min (ref >60)
Glucose, Bld: 126 mg/dL — ABNORMAL HIGH (ref 70–99)
Potassium: 3.6 mmol/L (ref 3.5–5.1)
Sodium: 135 mmol/L (ref 135–145)

## 2020-11-25 LAB — PHOSPHORUS: Phosphorus: 2.1 mg/dL — ABNORMAL LOW (ref 2.5–4.6)

## 2020-11-25 LAB — MAGNESIUM: Magnesium: 1.8 mg/dL (ref 1.7–2.4)

## 2020-11-25 MED ORDER — PANTOPRAZOLE SODIUM 40 MG PO PACK
40.0000 mg | PACK | Freq: Two times a day (BID) | ORAL | Status: DC
  Administered 2020-11-25 – 2020-11-27 (×5): 40 mg
  Filled 2020-11-25 (×6): qty 20

## 2020-11-25 MED ORDER — MAGNESIUM SULFATE 2 GM/50ML IV SOLN
2.0000 g | Freq: Once | INTRAVENOUS | Status: AC
  Administered 2020-11-25: 2 g via INTRAVENOUS
  Filled 2020-11-25: qty 50

## 2020-11-25 MED ORDER — CLOPIDOGREL BISULFATE 75 MG PO TABS
75.0000 mg | ORAL_TABLET | Freq: Every day | ORAL | Status: DC
  Administered 2020-11-25 – 2020-11-27 (×3): 75 mg
  Filled 2020-11-25 (×3): qty 1

## 2020-11-25 MED ORDER — OXYCODONE HCL 5 MG PO TABS
5.0000 mg | ORAL_TABLET | ORAL | Status: DC | PRN
  Administered 2020-11-25 – 2020-11-27 (×6): 5 mg
  Filled 2020-11-25 (×6): qty 1

## 2020-11-25 MED ORDER — ASPIRIN 81 MG PO CHEW
81.0000 mg | CHEWABLE_TABLET | Freq: Every day | ORAL | Status: DC
  Administered 2020-11-25 – 2020-11-27 (×3): 81 mg
  Filled 2020-11-25 (×3): qty 1

## 2020-11-25 MED ORDER — POTASSIUM PHOSPHATES 15 MMOLE/5ML IV SOLN
30.0000 mmol | Freq: Once | INTRAVENOUS | Status: AC
  Administered 2020-11-25: 11:00:00 30 mmol via INTRAVENOUS
  Filled 2020-11-25: qty 10

## 2020-11-25 NOTE — Progress Notes (Signed)
PROGRESS NOTE    Christopher [name]   [id number]  DOB: 1951/01/04  DOA: 11/17/2020     8  PCP: Christopher Bien, MD  CC: dysphagia  Hospital Course: Christopher [name] is a 70 year old male with history of right tonsillar cancer, status post surgery, radiation, chemotherapy also has history of chronic dysphagia, esophageal stricture, HIV, CAD, CKD stage III, diabetes mellitus type 2, hypertension, hyperlipidemia, hypothyroidism, carotid artery stenosis s/p bilateral revascularization/stenting, depression/anxiety presented with worsening odynophagia, dysphagia along with coughing up blood.   He underwent EGD which showed mild benign-appearing esophageal stenosis and edema in the upper esophagus from XRT with severe nasopharyngeal mucositis.  IR was consulted for G-tube placement which was performed on 11/23/20.  He was started on tube feeds on 11/24/2020.   Interval History:  No events overnight.  Spoke with patient's significant other,Christopher [name], on the phone this afternoon while in the room with the patient. We discussed plan for slowly advancing tube feeds and tentative discharge planned for Friday.  Questions answered and update given.  Patient and Christopher [name] are amenable with plan.  Old records reviewed in assessment of this patient  ROS: Constitutional: negative for chills and fevers, Respiratory: negative for cough, Cardiovascular: negative for chest pain and Gastrointestinal: negative for abdominal pain  Assessment & Plan: Hemoptysis versus hematemesis History of tonsillar cancer status post chemo XRT Dysphagia -Evaluated by ENT, no obvious source of bleeding noted on laryngoscopy -Status post EGD which showed only oropharyngeal mucositis with stable esophageal stenosis.  GI recommended PEG tube placement. - IR G-tube placement on 11/23/2020. -Continue IV Protonix -RD consult; TF started on 1/4; continue advancing per rec's to goal  Acute kidney injury on chronic kidney disease stage IIIa - 2/2  Dehydration/poor oral intake -Renal function improved after hydration.    HIV -Continue antiretroviral therapy.  Outpatient follow-up with ID  Hypokalemia -Replete and recheck as needed   Diabetes mellitus type 2 -Continue CBGs with SSI.  Hypertension -Blood pressure stable on the lower side.  Off benazepril.  Continue metoprolol  CAD -resume asa/plavix   Carotid artery stenosis status post bilateral revascularization/stent placement -Underwent right-sided revascularization on 05/10/2020 and left-sided revascularization in 06/2020 by Christopher [name].  On aspirin/Plavix indefinitely as an outpatient.   Anemia of chronic disease: From renal disease.  Hemoglobin stable.  Generalized deconditioning Severe malnutrition -Nutrition following.  PT eval. HHPT recommended but patient is declining  - see dysphagia  Antimicrobials: n/a  DVT prophylaxis: SCD Code Status: DNR Family Communication: none present Disposition Plan: Status is: Inpatient  Remains inpatient appropriate because:Unsafe d/c plan, IV treatments appropriate due to intensity of illness or inability to take PO and Inpatient level of care appropriate due to severity of illness   Dispo: The patient is from: Home              Anticipated d/c is to: Home              Anticipated d/c date is: 2 days              Patient currently is not medically stable to d/c.   Objective: Blood pressure 133/81, pulse 79, temperature 98.1 F (36.7 C), temperature source Oral, resp. rate 18, height 5' 8.5" (1.74 m), weight 57.3 kg, SpO2 98 %.  Examination: General appearance: alert, cooperative and no distress Head: Normocephalic, without obvious abnormality, atraumatic Eyes: EOMI Lungs: clear to auscultation bilaterally Heart: regular rate and rhythm and S1, S2 normal Abdomen: soft, NT, ND, BS present; G tube  in place Extremities: no edema Skin: mobility and turgor normal Neurologic: Grossly normal  Consultants:    IR  Procedures:   PEG placement in IR 11/23/20  Data Reviewed: I have personally reviewed following labs and imaging studies Results for orders placed or performed during the hospital encounter of 11/17/20 (from the past 24 hour(s))  Glucose, capillary     Status: Abnormal   Collection Time: 11/24/20  3:38 PM  Result Value Ref Range   Glucose-Capillary 109 (H) 70 - 99 mg/dL  Magnesium     Status: None   Collection Time: 11/24/20  4:51 PM  Result Value Ref Range   Magnesium 1.9 1.7 - 2.4 mg/dL  Phosphorus     Status: Abnormal   Collection Time: 11/24/20  4:51 PM  Result Value Ref Range   Phosphorus 1.6 (L) 2.5 - 4.6 mg/dL  Glucose, capillary     Status: None   Collection Time: 11/24/20  8:18 PM  Result Value Ref Range   Glucose-Capillary 99 70 - 99 mg/dL  Glucose, capillary     Status: None   Collection Time: 11/25/20 12:08 AM  Result Value Ref Range   Glucose-Capillary 97 70 - 99 mg/dL  Glucose, capillary     Status: None   Collection Time: 11/25/20  4:06 AM  Result Value Ref Range   Glucose-Capillary 97 70 - 99 mg/dL  Magnesium     Status: None   Collection Time: 11/25/20  5:54 AM  Result Value Ref Range   Magnesium 1.8 1.7 - 2.4 mg/dL  Phosphorus     Status: Abnormal   Collection Time: 11/25/20  5:54 AM  Result Value Ref Range   Phosphorus 2.1 (L) 2.5 - 4.6 mg/dL  Glucose, capillary     Status: Abnormal   Collection Time: 11/25/20  7:48 AM  Result Value Ref Range   Glucose-Capillary 142 (H) 70 - 99 mg/dL  Basic metabolic panel     Status: Abnormal   Collection Time: 11/25/20  8:05 AM  Result Value Ref Range   Sodium 135 135 - 145 mmol/L   Potassium 3.6 3.5 - 5.1 mmol/L   Chloride 100 98 - 111 mmol/L   CO2 26 22 - 32 mmol/L   Glucose, Bld 126 (H) 70 - 99 mg/dL   BUN 14 8 - 23 mg/dL   Creatinine, Ser 1.01 0.61 - 1.24 mg/dL   Calcium 9.5 8.9 - 10.3 mg/dL   GFR, Estimated >60 >60 mL/min   Anion gap 9 5 - 15  Glucose, capillary     Status: Abnormal    Collection Time: 11/25/20 12:28 PM  Result Value Ref Range   Glucose-Capillary 146 (H) 70 - 99 mg/dL    Recent Results (from the past 240 hour(s))  Resp Panel by RT-PCR (Flu A&B, Covid) Nasopharyngeal Swab     Status: None   Collection Time: 11/17/20  9:10 PM   Specimen: Nasopharyngeal Swab; Nasopharyngeal(NP) swabs in vial transport medium  Result Value Ref Range Status   SARS Coronavirus 2 by RT PCR NEGATIVE NEGATIVE Final    Comment: (NOTE) SARS-CoV-2 target nucleic acids are NOT DETECTED.  The SARS-CoV-2 RNA is generally detectable in upper respiratory specimens during the acute phase of infection. The lowest concentration of SARS-CoV-2 viral copies this assay can detect is 138 copies/mL. A negative result does not preclude SARS-Cov-2 infection and should not be used as the sole basis for treatment or other patient management decisions. A negative result may occur with  improper specimen  collection/handling, submission of specimen other than nasopharyngeal swab, presence of viral mutation(s) within the areas targeted by this assay, and inadequate number of viral copies(<138 copies/mL). A negative result must be combined with clinical observations, patient history, and epidemiological information. The expected result is Negative.  Fact Sheet for Patients:  BloggerCourse.com  Fact Sheet for Healthcare Providers:  SeriousBroker.it  This test is no t yet approved or cleared by the [place] FDA and  has been authorized for detection and/or diagnosis of SARS-CoV-2 by FDA under an Emergency Use Authorization (EUA). This EUA will remain  in effect (meaning this test can be used) for the duration of the COVID-19 declaration under Section 564(b)(1) of the Act, 21 U.S.C.section 360bbb-3(b)(1), unless the authorization is terminated  or revoked sooner.       Influenza A by PCR NEGATIVE NEGATIVE Final   Influenza B by PCR  NEGATIVE NEGATIVE Final    Comment: (NOTE) The Xpert Xpress SARS-CoV-2/FLU/RSV plus assay is intended as an aid in the diagnosis of influenza from Nasopharyngeal swab specimens and should not be used as a sole basis for treatment. Nasal washings and aspirates are unacceptable for Xpert Xpress SARS-CoV-2/FLU/RSV testing.  Fact Sheet for Patients: BloggerCourse.com  Fact Sheet for Healthcare Providers: SeriousBroker.it  This test is not yet approved or cleared by the [place] FDA and has been authorized for detection and/or diagnosis of SARS-CoV-2 by FDA under an Emergency Use Authorization (EUA). This EUA will remain in effect (meaning this test can be used) for the duration of the COVID-19 declaration under Section 564(b)(1) of the Act, 21 U.S.C. section 360bbb-3(b)(1), unless the authorization is terminated or revoked.  Performed at [facility], [address] [place], [place] [address]   Surgical pcr screen     Status: Abnormal   Collection Time: 11/23/20  5:50 AM   Specimen: Nasal Mucosa; Nasal Swab  Result Value Ref Range Status   MRSA, PCR NEGATIVE NEGATIVE Final   Staphylococcus aureus POSITIVE (A) NEGATIVE Final    Comment: (NOTE) The Xpert SA Assay (FDA approved for NASAL specimens in patients 48 years of age and older), is one component of a comprehensive surveillance program. It is not intended to diagnose infection nor to guide or monitor treatment. Performed at [facility], [address], [place], [place] [address]      Radiology Studies: No results found. IR GASTROSTOMY TUBE MOD SED  Final Result    CT ABDOMEN WO CONTRAST  Final Result    CT Soft Tissue Neck W Contrast  Final Result      Scheduled Meds: . abacavir-dolutegravir-lamiVUDine  1 tablet Oral Daily  . Chlorhexidine Gluconate Cloth  6 each Topical Q0600  . FLUoxetine  20 mg Oral Daily  . free  water  100 mL Per Tube Q6H  . insulin aspart  0-9 Units Subcutaneous Q4H  . levothyroxine  75 mcg Oral QAC breakfast  . magic mouthwash w/lidocaine  10 mL Oral TID AC & HS  . metoprolol succinate  12.5 mg Oral Daily  . mupirocin ointment  1 application Nasal BID  . pantoprazole sodium  40 mg Per Tube BID  . temazepam  30 mg Oral QHS   PRN Meds: sodium chloride, clonazePAM, ondansetron **OR** ondansetron (ZOFRAN) IV, oxyCODONE Continuous Infusions: . sodium chloride 1,000 mL (11/18/20 1707)  . feeding supplement (OSMOLITE 1.2 CAL) 30 mL/hr at 11/25/20 0531  . potassium PHOSPHATE IVPB (in mmol) 30 mmol (11/25/20 1057)     LOS:  8 days  Time spent: Greater than 50% of the 35 minute visit was spent in counseling/coordination of care for the patient as laid out in the A&P.   Dwyane Dee, MD [facility] 11/25/2020, 2:55 PM

## 2020-11-25 NOTE — Evaluation (Signed)
Clinical/Bedside Swallow Evaluation Patient Details  Name: Christopher [name] MRN: [id number] Date of Birth: 11-24-1950  Today's Date: 11/25/2020 Time: SLP Start Time (ACUTE ONLY): 1625 SLP Stop Time (ACUTE ONLY): 1645 SLP Time Calculation (min) (ACUTE ONLY): 20 min  Past Medical History:  Past Medical History:  Diagnosis Date  . Anxiety   . Cataract   . Chronic kidney disease   . 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   . Diabetes mellitus without complication (HCC)   . GERD (gastroesophageal reflux disease)   . Heart murmur    CHILDHOOD  . History of kidney stones   . HIV DISEASE 12/01/2006  . HYPERLIPIDEMIA, WITH LOW HDL 01/03/2008  . Hypertension   . HYPERTENSION 12/01/2006  . HYPOGONADISM 12/23/2009  . Hypothyroidism   . Neuromuscular disorder (HCC)   . SYPHILIS, [place], LATENT NOS 01/18/2007  . Thyroid disease   . Tonsillar cancer (HCC)    tonsillar ca   . Tuberculosis    history of TB about 30 years ago   Past Surgical History:  Past Surgical History:  Procedure Laterality Date  . COLONOSCOPY    . CORONARY ANGIOGRAPHY N/A 05/05/2020   Procedure: CORONARY ANGIOGRAPHY;  Surgeon: Elder Negus, MD;  Location: [facility] INVASIVE CV LAB;  Service: Cardiovascular;  Laterality: N/A;  . ESOPHAGOGASTRODUODENOSCOPY (EGD) WITH PROPOFOL N/A 11/19/2020   Procedure: ESOPHAGOGASTRODUODENOSCOPY (EGD) WITH PROPOFOL;  Surgeon: Iva Boop, MD;  Location: [facility] ENDOSCOPY;  Service: Endoscopy;  Laterality: N/A;  Possible esophageal dilatation  . IR GASTROSTOMY TUBE MOD SED  11/23/2020  . TONSILECTOMY, ADENOIDECTOMY, BILATERAL MYRINGOTOMY AND TUBES     tonsil cancer   . TONSILLECTOMY    . TRANSCAROTID ARTERY REVASCULARIZATION Right 05/18/2020   Procedure: RIGHT TRANSCAROTID ARTERY REVASCULARIZATION;   Surgeon: Sherren Kerns, MD;  Location: [facility] OR;  Service: Vascular;  Laterality: Right;  . TRANSCAROTID ARTERY REVASCULARIZATION Left 06/22/2020   Procedure: LEFT TRANSCAROTID ARTERY REVASCULARIZATION;  Surgeon: Sherren Kerns, MD;  Location: [facility] OR;  Service: Vascular;  Laterality: Left;  . ULTRASOUND GUIDANCE FOR VASCULAR ACCESS Right 06/22/2020   Procedure: ULTRASOUND GUIDANCE FOR VASCULAR ACCESS;  Surgeon: Sherren Kerns, MD;  Location: [facility] OR;  Service: Vascular;  Laterality: Right;  . UPPER GASTROINTESTINAL ENDOSCOPY     HPI:  70 year old male with medical history of right tonsillar cancer status post surgery, radiation and chemotherapy, chronic dysphagia, esophageal stricture, HIV, CAD, CKD stage III, diabetes mellitus type 2, HTN, hypothyroidism, carotid artery stenosis s/p bilateral revascularization/stenting, depression/anxiety presented with worsening odynophagia, dysphagia along with coughing up blood.   Assessment / Plan / Recommendation Clinical Impression  Patient presents with a mild-moderate oropharyngeal dysphagia with SLP observing patient to take multiple swallows with sips of thin liquids and one slightly delayed cough response after sip of thin liquids (water). Other consistencies were not attempted secondary to patient being managed by GI as well and current recommendation from GI was clear liquids diet. 2018 esophagram reported "frank aspiration" and patient did endorse coughing with PO intake. Plan to proceed with MBS to determine if any strategies or diet consistency changes would be helpful to reduce aspiration risk. Patient had PEG placed 1/4 and plan to discharge home and use PEG for primary nutrition secondary to malnutrition and unintended weight loss. SLP Visit Diagnosis: Dysphagia, unspecified (R13.10)  Aspiration Risk  Moderate aspiration risk    Diet Recommendation Thin liquid   Liquid Administration via: Cup;Other (Comment) (patient reported he is unable  to use a straw) Medication Administration: Whole meds with liquid Supervision: Patient able to self feed Compensations: Minimize environmental distractions;Small sips/bites;Slow rate Postural Changes: Seated upright at 90 degrees    Other  Recommendations Oral Care Recommendations: Oral care BID   Follow up Recommendations Other (comment) (pending MBS results but likely none)      Frequency and Duration min 1 x/week  1 week       Prognosis Prognosis for Safe Diet Advancement: Fair Barriers to Reach Goals: Other (Comment) Barriers/Prognosis Comment: fair prognosis as patient to be discharging home in next couple days and his dysphagia is chronic in nature      Swallow Study   General Date of Onset: 11/17/20 HPI: 70 year old male with medical history of right tonsillar cancer status post surgery, radiation and chemotherapy, chronic dysphagia, esophageal stricture, HIV, CAD, CKD stage III, diabetes mellitus type 2, HTN, hypothyroidism, carotid artery stenosis s/p bilateral revascularization/stenting, depression/anxiety presented with worsening odynophagia, dysphagia along with coughing up blood. Type of Study: Bedside Swallow Evaluation Previous Swallow Assessment: Esophagram 2018 showed frank aspiration Diet Prior to this Study: Thin liquids Temperature Spikes Noted: No Respiratory Status: Room air History of Recent Intubation: No Behavior/Cognition: Alert;Cooperative;Pleasant mood Oral Cavity Assessment: Within Functional Limits Oral Cavity - Dentition: Poor condition Vision: Functional for self-feeding Self-Feeding Abilities: Able to feed self Patient Positioning: Upright in bed Baseline Vocal Quality: Hoarse;Normal Volitional Cough: Weak Volitional Swallow: Able to elicit    Oral/Motor/Sensory Function Overall Oral Motor/Sensory Function: Mild impairment Facial ROM: Reduced right Facial Strength: Reduced right Lingual ROM: Within Functional Limits Lingual Symmetry:  Within Functional Limits Lingual Strength: Within Functional Limits   Ice Chips Ice chips: Not tested   Thin Liquid Thin Liquid: Impaired Presentation: Cup Pharyngeal  Phase Impairments: Cough - Delayed;Multiple swallows Other Comments: One delayed cough.                   Sonia Baller, MA, CCC-SLP Speech Therapy

## 2020-11-25 NOTE — TOC Initial Note (Signed)
Transition of Care [facility]) - Initial/Assessment Note    Patient Details  Name: Christopher [name] MRN: [id number] Date of Birth: Feb 09, 1951  [facility] of [facility]) CM/SW Contact:    Lennart Pall, LCSW Phone Number: 11/25/2020, 10:32 AM  Clinical Narrative:                 Met yesterday and today with patient and spouse.  Discussion of potential HH needs including HHPT/RN and tube feeding supplies.  Pt does NOT want to have any HHPT but is agreeable with HHRN.  Pt and spouse are eager for discharge but have explained that we are waiting for him to reach goal rate on tube feeds.  Spouse asking about plan for tube feeds at home and if there could be a "hybrid" coverage using both continuous feeding and bolus.  Have alerted RD, MD and RN to this question and RD will follow up with pt/spouse about this.  Once a definitive plan for feedings is established, I will place orders for tube feeding supplies and HHRN. Continue to follow.  Expected Discharge Plan: [place] Barriers to Discharge: Continued Medical Work up   Patient Goals and CMS Choice Patient states their goals for this hospitalization and ongoing recovery are:: go home      Expected Discharge Plan and Services Expected Discharge Plan: [place] Acute Care Choice: [place] arrangements for the past 2 months: Single Family Home                                      Prior Living Arrangements/Services Living arrangements for the past 2 months: Single Family Home Lives with:: Spouse Patient language and need for interpreter reviewed:: Yes Do you feel safe going back to the place where you live?: Yes      Need for Family Participation in Patient Care: Yes (Comment) Care giver support system in place?: Yes (comment)   Criminal Activity/Legal Involvement Pertinent to Current Situation/Hospitalization: No - Comment as needed  Activities of Daily Living Home Assistive  Devices/Equipment: Cane (specify quad or straight),Eyeglasses (reading glasses) ADL Screening (condition at time of admission) Patient's cognitive ability adequate to safely complete daily activities?: Yes Is the patient deaf or have difficulty hearing?: No Does the patient have difficulty seeing, even when wearing glasses/contacts?: No Does the patient have difficulty concentrating, remembering, or making decisions?: No Patient able to express need for assistance with ADLs?: No (has trouble speaking) Does the patient have difficulty dressing or bathing?: No Independently performs ADLs?: No Communication: Independent Dressing (OT): Independent Grooming: Independent Feeding: Independent Bathing: Independent Toileting: Needs assistance Is this a change from baseline?: Pre-admission baseline In/Out Bed: Needs assistance Is this a change from baseline?: Pre-admission baseline Walks in Home: Needs assistance Is this a change from baseline?: Pre-admission baseline Does the patient have difficulty walking or climbing stairs?: Yes Weakness of Legs: Both (neuropathy in both feet) Weakness of Arms/Hands: Both (has tremors per his husband)  Permission Sought/Granted Permission sought to share information with : Family Supports Permission granted to share information with : Yes, Verbal Permission Granted  Share Information with NAME: Retta Diones     Permission granted to share info w Relationship: spouse  Permission granted to share info w Contact Information: [phone]  Emotional Assessment Appearance:: Appears older than stated age,Appears stated age Attitude/Demeanor/Rapport: Engaged Affect (typically observed): Accepting  Orientation: : Oriented to Self,Oriented to Place,Oriented to  Time,Oriented to Situation Alcohol / Substance Use: Not Applicable Psych Involvement: No (comment)  Admission diagnosis:  Odynophagia [R13.10] Poor dentition [K08.9] History of throat cancer  [Z85.819] Dysphagia, unspecified type [R13.10] Patient Active Problem List   Diagnosis Date Noted  . Protein-calorie malnutrition, severe 11/20/2020  . Odynophagia 11/17/2020  . Hypothyroid 11/17/2020  . Dysphagia 07/15/2020  . Thrush 07/15/2020  . Carotid stenosis 06/22/2020  . Carotid stenosis, asymptomatic, right 05/18/2020  . Abnormal stress test 05/04/2020  . Malnutrition of moderate degree 04/16/2020  . Hyperosmolar hyperglycemic state (HHS) ([place]) 04/13/2020  . Diabetes ([place]) 04/13/2020  . Head injury 04/13/2020  . Diarrhea 04/13/2020  . Acute kidney injury superimposed on CKD ([place]) 04/13/2020  . Depression 10/01/2013  . Peripheral neuropathy 09/30/2013  . Renal insufficiency 08/19/2013  . Hypothyroidism 11/01/2012  . 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 Hyperglycemia 12/08/2011  . HYPOGONADISM 12/23/2009  . 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 Hyperlipidemia associated with type 2 diabetes mellitus ([place]) 01/03/2008  . SYPHILIS, [place], LATENT NOS 01/18/2007  . Human immunodeficiency virus (HIV) disease ([place]) 12/01/2006  . Hypertension associated with diabetes ([place]) 12/01/2006  . PROTEINURIA 12/01/2006  . HIV DISEASE 12/01/2006   PCP:  Fanny Bien, MD Pharmacy:   [facility] [address], [place] [facility] [address] [place] [address] Phone: [phone] Fax: [phone]     Social Determinants of Health ([place]) Interventions    Readmission Risk Interventions No flowsheet data found.

## 2020-11-25 NOTE — Progress Notes (Signed)
Physical Therapy Treatment Patient Details Name: Christopher [name] MRN: [id number] DOB: [birth year] Today's Date: 11/25/2020    History of Present Illness 70 year old male with medical history of right tonsillar cancer status post surgery, radiation and chemotherapy, chronic dysphagia, esophageal stricture, HIV, CAD, CKD stage III, diabetes mellitus type 2, HTN, hypothyroidism, carotid artery stenosis s/p bilateral revascularization/stenting, depression/anxiety presented with worsening odynophagia, dysphagia along with coughing up blood.s/p G tube placement this admission    PT Comments    Progressing with mobility. Per CM, pt is declining HHPT f/u. Encouraged pt to use his cane/RW at home for ambulation safely if needed (he is currently relying on IV pole for some support).   Follow Up Recommendations  Supervision - Intermittent;Home health PT (pt declines HHPT f/u)     Equipment Recommendations  None recommended by PT    Recommendations for Other Services       Precautions / Restrictions Precautions Precautions: Fall Precaution Comments: G tube Restrictions Weight Bearing Restrictions: No    Mobility  Bed Mobility Overal bed mobility: Needs Assistance Bed Mobility: Supine to Sit;Sit to Supine     Supine to sit: Supervision;HOB elevated Sit to supine: Supervision;HOB elevated      Transfers Overall transfer level: Needs assistance Equipment used: None (IV pole) Transfers: Sit to/from Stand Sit to Stand: Min guard         General transfer comment: Pt used foot board and IV pole to rise. Increased time.  Ambulation/Gait Ambulation/Gait assistance: Min guard Gait Distance (Feet): 125 Feet Assistive device: IV Pole Gait Pattern/deviations: Step-through pattern;Decreased stride length;Decreased step length - right;Decreased step length - left     General Gait Details: Used 2 hands on IV pole for support. Slow gait speed. Unsteady at times. Pt denied  dizziness.   Stairs             Wheelchair Mobility    Modified Rankin (Stroke Patients Only)       Balance Overall balance assessment: Needs assistance         Standing balance support: During functional activity;Bilateral upper extremity supported Standing balance-Leahy Scale: Poor                              Cognition Arousal/Alertness: Awake/alert Behavior During Therapy: WFL for tasks assessed/performed Overall Cognitive Status: Within Functional Limits for tasks assessed                                        Exercises      General Comments        Pertinent Vitals/Pain Pain Assessment: No/denies pain    Home Living                      Prior Function            PT Goals (current goals can now be found in the care plan section) Progress towards PT goals: Progressing toward goals    Frequency    Min 3X/week      PT Plan Current plan remains appropriate    Co-evaluation              AM-PAC PT "6 Clicks" Mobility   Outcome Measure  Help needed turning from your back to your side while in a flat bed without using bedrails?: None Help needed moving from lying on  your back to sitting on the side of a flat bed without using bedrails?: None Help needed moving to and from a bed to a chair (including a wheelchair)?: A Little Help needed standing up from a chair using your arms (e.g., wheelchair or bedside chair)?: A Little Help needed to walk in hospital room?: A Little Help needed climbing 3-5 steps with a railing? : A Little 6 Click Score: 20    End of Session Equipment Utilized During Treatment: Gait belt Activity Tolerance: Patient tolerated treatment well Patient left: in bed;with call bell/phone within reach;with family/visitor present   PT Visit Diagnosis: Muscle weakness (generalized) (M62.81);Unsteadiness on feet (R26.81)     Time: [id number] PT Time Calculation (min) (ACUTE ONLY): 9  min  Charges:  $Gait Training: 8-22 mins                         Doreatha Massed, PT Acute Rehabilitation  Office: [phone] Pager: [phone]

## 2020-11-26 ENCOUNTER — Inpatient Hospital Stay (HOSPITAL_COMMUNITY): Payer: No Typology Code available for payment source

## 2020-11-26 DIAGNOSIS — R1310 Dysphagia, unspecified: Secondary | ICD-10-CM | POA: Diagnosis not present

## 2020-11-26 LAB — BASIC METABOLIC PANEL
Anion gap: 9 (ref 5–15)
BUN: 18 mg/dL (ref 8–23)
CO2: 27 mmol/L (ref 22–32)
Calcium: 9.4 mg/dL (ref 8.9–10.3)
Chloride: 98 mmol/L (ref 98–111)
Creatinine, Ser: 1.14 mg/dL (ref 0.61–1.24)
GFR, Estimated: >60 mL/min (ref >60)
Glucose, Bld: 135 mg/dL — ABNORMAL HIGH (ref 70–99)
Potassium: 3.7 mmol/L (ref 3.5–5.1)
Sodium: 134 mmol/L — ABNORMAL LOW (ref 135–145)

## 2020-11-26 LAB — GLUCOSE, CAPILLARY
Glucose-Capillary: 111 mg/dL — ABNORMAL HIGH (ref 70–99)
Glucose-Capillary: 113 mg/dL — ABNORMAL HIGH (ref 70–99)
Glucose-Capillary: 116 mg/dL — ABNORMAL HIGH (ref 70–99)
Glucose-Capillary: 117 mg/dL — ABNORMAL HIGH (ref 70–99)
Glucose-Capillary: 123 mg/dL — ABNORMAL HIGH (ref 70–99)
Glucose-Capillary: 126 mg/dL — ABNORMAL HIGH (ref 70–99)
Glucose-Capillary: 140 mg/dL — ABNORMAL HIGH (ref 70–99)

## 2020-11-26 LAB — PHOSPHORUS: Phosphorus: 3.2 mg/dL (ref 2.5–4.6)

## 2020-11-26 LAB — MAGNESIUM: Magnesium: 2 mg/dL (ref 1.7–2.4)

## 2020-11-26 NOTE — Care Management Important Message (Signed)
Important Message  Patient Details IM Letter given to the Patient. Name: Christopher [name] MRN: [id number] Date of Birth: 10/31/1951   Medicare Important Message Given:  Yes     Caren Macadam 11/26/2020, 1:02 PM

## 2020-11-26 NOTE — Progress Notes (Signed)
Modified Barium Swallow Progress Note  Patient Details  Name: Christopher [name] MRN: [id number] Date of Birth: 15-Jan-1951  Today's Date: 11/26/2020  Modified Barium Swallow completed.  Full report located under Chart Review in the Imaging Section.  Brief recommendations include the following:  Clinical Impression  Pt presents with a chronic and progressive radiation-induced dysphagia.  The fibrosis has led to changes in muscle composition and function, resulting in limited mobility of all the pharyngeal and laryngeal structures.  There is limited propulsion of solids through the pharynx, leading to accumulation of solid foods and difficulty passing through UES. Thin and nectar thick liquids pass more easily through the pharynx and UES, but tend to spill into the larynx and may reach the vocal folds due to poor laryngeal vestibule closure.  Pt has a spontaneous cough/throat-clearing response which is effective in expelling penetrants.  Aspiration was not observed on this study.  Given new PEG for primary nutrition, recommend that pt consume liquids for pleasure (thin or nectar thick).  It may be best to avoid solid foods due to retention in throat.  Study was reviewed in real time by pt, who verbalized agreement.  After exam, I phoned [name], Mr. Chew husband, and reviewed nature of swallowing deficits, recommendations for PO intake, and likelihood that swallowing deficits will not improve.  [name] verbalized understanding.   Swallow Evaluation Recommendations       SLP Diet Recommendations: Thin liquid;Nectar thick liquid   Liquid Administration via: Cup   Medication Administration: Via alternative means   Supervision: Patient able to self feed   Compensations: Small sips/bites       Oral Care Recommendations: Oral care BID       [name], MA CCC/SLP Acute Rehabilitation Services Office number [phone] Pager [phone]  [name]  [name] 11/26/2020,12:59 PM

## 2020-11-26 NOTE — TOC Progression Note (Signed)
Transition of Care [facility]) - Progression Note    Patient Details  Name: Christopher [name] MRN: [id number] Date of Birth: 08/15/1951  Transition of Care [facility]) CM/SW Contact  Amada Jupiter, LCSW Phone Number: 11/26/2020, 4:07 PM  Clinical Narrative:     Have placed order for tube feeding supplies + pump with [employer] (have provided supplies to pt in the past).  A liaison with [employer] will follow up with pt/ spouse on delivery.  [employer] aware planning for dc tomorrow.  Expected Discharge Plan: Home w Home Health Services Barriers to Discharge: Continued Medical Work up  Expected Discharge Plan and Services Expected Discharge Plan: Home w Home Health Services     Post Acute Care Choice: Home Health Living arrangements for the past 2 months: Single Family Home                                       Social Determinants of Health (SDOH) Interventions    Readmission Risk Interventions No flowsheet data found.

## 2020-11-26 NOTE — Progress Notes (Signed)
PROGRESS NOTE    Christopher [name]   [id number]  DOB: 1951/03/19  DOA: 11/17/2020     9  PCP: Fanny Bien, MD  CC: dysphagia  Hospital Course: Mr. Christopher [name] is a 70 year old male with history of right tonsillar cancer, status post surgery, radiation, chemotherapy also has history of chronic dysphagia, esophageal stricture, HIV, CAD, CKD stage III, diabetes mellitus type 2, hypertension, hyperlipidemia, hypothyroidism, carotid artery stenosis s/p bilateral revascularization/stenting, depression/anxiety presented with worsening odynophagia, dysphagia along with coughing up blood.   He underwent EGD which showed mild benign-appearing esophageal stenosis and edema in the upper esophagus from XRT with severe nasopharyngeal mucositis.  IR was consulted for G-tube placement which was performed on 11/23/20.  He was started on tube feeds on 11/24/2020. He also underwent repeat SLP evaluation with plans for modified barium swallow on 11/26/2020.   Interval History:  No events overnight.  Resting in bed comfortable this morning.  Understands plan for swallow study today and tentative discharge tomorrow.  Old records reviewed in assessment of this patient  ROS: Constitutional: negative for chills and fevers, Respiratory: negative for cough, Cardiovascular: negative for chest pain and Gastrointestinal: negative for abdominal pain  Assessment & Plan: Hemoptysis versus hematemesis History of tonsillar cancer status post chemo XRT Dysphagia -Evaluated by ENT, no obvious source of bleeding noted on laryngoscopy -Status post EGD which showed only oropharyngeal mucositis with stable esophageal stenosis.  GI recommended PEG tube placement. - IR G-tube placement on 11/23/2020. -Continue IV Protonix -RD consult; TF started on 1/4; continue advancing per rec's to goal -Follow-up modified barium swallow study results on 11/26/2020  Acute kidney injury on chronic kidney disease stage IIIa -Baseline  creatinine around 1.2-1.3 - 2/2 Dehydration/poor oral intake -Renal function improved after hydration.    HIV -Continue antiretroviral therapy.  Outpatient follow-up with ID  Hypokalemia -Replete and recheck as needed   Diabetes mellitus type 2 -Continue CBGs with SSI.  Hypertension -Blood pressure stable on the lower side.  Off benazepril.  Continue metoprolol  CAD -resume asa/plavix   Carotid artery stenosis status post bilateral revascularization/stent placement -Underwent right-sided revascularization on 05/10/2020 and left-sided revascularization in 06/2020 by Dr. Oneida Alar.  On aspirin/Plavix indefinitely as an outpatient.   Anemia of chronic disease: From renal disease.  Hemoglobin stable.  Generalized deconditioning Severe malnutrition -Nutrition following.  PT eval. HHPT recommended but patient is declining  - see dysphagia  Antimicrobials: n/a  DVT prophylaxis: SCD Code Status: DNR Family Communication: none present Disposition Plan: Status is: Inpatient  Remains inpatient appropriate because:Unsafe d/c plan, IV treatments appropriate due to intensity of illness or inability to take PO and Inpatient level of care appropriate due to severity of illness   Dispo: The patient is from: Home              Anticipated d/c is to: Home              Anticipated d/c date is: 11/27/2020              Patient currently is not medically stable to d/c.   Objective: Blood pressure 105/66, pulse 71, temperature 97.7 F (36.5 C), temperature source Oral, resp. rate 17, height 5' 8.5" (1.74 m), weight 57.3 kg, SpO2 95 %.  Examination: General appearance: alert, cooperative and no distress Head: Normocephalic, without obvious abnormality, atraumatic Eyes: EOMI Lungs: clear to auscultation bilaterally Heart: regular rate and rhythm and S1, S2 normal Abdomen: soft, NT, ND, BS present; G tube in place Extremities:  no edema Skin: mobility and turgor normal Neurologic:  Grossly normal  Consultants:   IR  Procedures:   PEG placement in IR 11/23/20  Data Reviewed: I have personally reviewed following labs and imaging studies Results for orders placed or performed during the hospital encounter of 11/17/20 (from the past 24 hour(s))  Glucose, capillary     Status: Abnormal   Collection Time: 11/25/20  4:17 PM  Result Value Ref Range   Glucose-Capillary 118 (H) 70 - 99 mg/dL  Glucose, capillary     Status: Abnormal   Collection Time: 11/25/20  8:08 PM  Result Value Ref Range   Glucose-Capillary 115 (H) 70 - 99 mg/dL  Glucose, capillary     Status: Abnormal   Collection Time: 11/26/20 12:08 AM  Result Value Ref Range   Glucose-Capillary 111 (H) 70 - 99 mg/dL  Glucose, capillary     Status: Abnormal   Collection Time: 11/26/20  4:08 AM  Result Value Ref Range   Glucose-Capillary 117 (H) 70 - 99 mg/dL  Basic metabolic panel     Status: Abnormal   Collection Time: 11/26/20  6:01 AM  Result Value Ref Range   Sodium 134 (L) 135 - 145 mmol/L   Potassium 3.7 3.5 - 5.1 mmol/L   Chloride 98 98 - 111 mmol/L   CO2 27 22 - 32 mmol/L   Glucose, Bld 135 (H) 70 - 99 mg/dL   BUN 18 8 - 23 mg/dL   Creatinine, Ser 1.14 0.61 - 1.24 mg/dL   Calcium 9.4 8.9 - 10.3 mg/dL   GFR, Estimated >60 >60 mL/min   Anion gap 9 5 - 15  Magnesium     Status: None   Collection Time: 11/26/20  6:01 AM  Result Value Ref Range   Magnesium 2.0 1.7 - 2.4 mg/dL  Phosphorus     Status: None   Collection Time: 11/26/20  6:01 AM  Result Value Ref Range   Phosphorus 3.2 2.5 - 4.6 mg/dL  Glucose, capillary     Status: Abnormal   Collection Time: 11/26/20  7:40 AM  Result Value Ref Range   Glucose-Capillary 116 (H) 70 - 99 mg/dL  Glucose, capillary     Status: Abnormal   Collection Time: 11/26/20 12:08 PM  Result Value Ref Range   Glucose-Capillary 140 (H) 70 - 99 mg/dL    Recent Results (from the past 240 hour(s))  Resp Panel by RT-PCR (Flu A&B, Covid) Nasopharyngeal Swab      Status: None   Collection Time: 11/17/20  9:10 PM   Specimen: Nasopharyngeal Swab; Nasopharyngeal(NP) swabs in vial transport medium  Result Value Ref Range Status   SARS Coronavirus 2 by RT PCR NEGATIVE NEGATIVE Final    Comment: (NOTE) SARS-CoV-2 target nucleic acids are NOT DETECTED.  The SARS-CoV-2 RNA is generally detectable in upper respiratory specimens during the acute phase of infection. The lowest concentration of SARS-CoV-2 viral copies this assay can detect is 138 copies/mL. A negative result does not preclude SARS-Cov-2 infection and should not be used as the sole basis for treatment or other patient management decisions. A negative result may occur with  improper specimen collection/handling, submission of specimen other than nasopharyngeal swab, presence of viral mutation(s) within the areas targeted by this assay, and inadequate number of viral copies(<138 copies/mL). A negative result must be combined with clinical observations, patient history, and epidemiological information. The expected result is Negative.  Fact Sheet for Patients:  EntrepreneurPulse.com.au  Fact Sheet for Healthcare Providers:  SeriousBroker.it  This test is no t yet approved or cleared by the [place] and  has been authorized for detection and/or diagnosis of SARS-CoV-2 by FDA under an Emergency Use Authorization (EUA). This EUA will remain  in effect (meaning this test can be used) for the duration of the COVID-19 declaration under Section 564(b)(1) of the Act, 21 U.S.C.section 360bbb-3(b)(1), unless the authorization is terminated  or revoked sooner.       Influenza A by PCR NEGATIVE NEGATIVE Final   Influenza B by PCR NEGATIVE NEGATIVE Final    Comment: (NOTE) The Xpert Xpress SARS-CoV-2/FLU/RSV plus assay is intended as an aid in the diagnosis of influenza from Nasopharyngeal swab specimens and should not be used as a sole basis  for treatment. Nasal washings and aspirates are unacceptable for Xpert Xpress SARS-CoV-2/FLU/RSV testing.  Fact Sheet for Patients: BloggerCourse.com  Fact Sheet for Healthcare Providers: SeriousBroker.it  This test is not yet approved or cleared by the [place] FDA and has been authorized for detection and/or diagnosis of SARS-CoV-2 by FDA under an Emergency Use Authorization (EUA). This EUA will remain in effect (meaning this test can be used) for the duration of the COVID-19 declaration under Section 564(b)(1) of the Act, 21 U.S.C. section 360bbb-3(b)(1), unless the authorization is terminated or revoked.  Performed at [facility], [address], [place], [place] [address]   Surgical pcr screen     Status: Abnormal   Collection Time: 11/23/20  5:50 AM   Specimen: Nasal Mucosa; Nasal Swab  Result Value Ref Range Status   MRSA, PCR NEGATIVE NEGATIVE Final   Staphylococcus aureus POSITIVE (A) NEGATIVE Final    Comment: (NOTE) The Xpert SA Assay (FDA approved for NASAL specimens in patients 4 years of age and older), is one component of a comprehensive surveillance program. It is not intended to diagnose infection nor to guide or monitor treatment. Performed at [facility], [address], [place], [place] [address]      Radiology Studies: No results found. IR GASTROSTOMY TUBE MOD SED  Final Result    CT ABDOMEN WO CONTRAST  Final Result    CT Soft Tissue Neck W Contrast  Final Result    DG Swallowing Func-Speech Pathology    (Results Pending)    Scheduled Meds: . abacavir-dolutegravir-lamiVUDine  1 tablet Oral Daily  . aspirin  81 mg Per Tube Daily  . Chlorhexidine Gluconate Cloth  6 each Topical Q0600  . clopidogrel  75 mg Per Tube Daily  . FLUoxetine  20 mg Oral Daily  . free water  100 mL Per Tube Q6H  . insulin aspart  0-9 Units Subcutaneous Q4H  .  levothyroxine  75 mcg Oral QAC breakfast  . magic mouthwash w/lidocaine  10 mL Oral TID AC & HS  . metoprolol succinate  12.5 mg Oral Daily  . mupirocin ointment  1 application Nasal BID  . pantoprazole sodium  40 mg Per Tube BID  . temazepam  30 mg Oral QHS   PRN Meds: sodium chloride, clonazePAM, ondansetron **OR** ondansetron (ZOFRAN) IV, oxyCODONE Continuous Infusions: . sodium chloride 1,000 mL (11/18/20 1707)  . feeding supplement (OSMOLITE 1.2 CAL) 1,000 mL (11/26/20 0856)     LOS: 9 days  Time spent: Greater than 50% of the 35 minute visit was spent in counseling/coordination of care for the patient as laid out in the A&P.   Lewie Chamber, MD [facility] 11/26/2020, 12:48 PM

## 2020-11-26 NOTE — Progress Notes (Signed)
NUTRITION NOTE  Patient is currently receiving Osmolite 1.2 @ 50 ml/hr with 100 ml free water QID. Goal rate is Osmolite 1.2 @ 70 ml/hr. Slow advancement d/t refeeding risk. K, Mg, Phos all WDL.   Patient laying in bed. He denies any abdominal pain/pressure/cramping and denies nausea. S/p MBS this AM with recommendation for thin and nectar-thick liquid.   Patient's husband was not at bedside at the time of RD visit d/t being at work and patient reports husband will arrive later today.   Able to talk with patient's husband on the phone. He is interested in having outline of both a bolus TF regimen, continuous TF regimen, and hybrid of the two in order to account for different days of the week based on he and patient's schedules.   He asked about what to expect concerning BMs and about fiber. After discussion, RD and patient's husband agreed it would be best to utilize metamucil (or equivalent) to provide fiber rather than using two different TF formulas. Osmolite does not contain fiber.  Bolus TF regimen: 1 carton (237 ml) Osmolite 1.2 x6/day with 120 ml free water before and 120 ml after each TF bolus.  Continuous TF regimen: Osmolite 1.2 @ 90 ml/hr x18 hours/day with 100 ml free water every 3 hours during TF infusion.  Hybrid regimen: Osmolite 1.2 @ 70 ml/hr x12 hours with 100 ml free water every 3 hours during TF infusion + 1 carton (237 ml) Osmolite 1.2 x4/day with 60 ml free water before and 60 ml free water after each TF bolus.  Metamucil recommendation: 2 teaspoons (tsp) TID to provide 90 kcal and 15 grams fiber. Can be mixed into TF formula or into liquids being consumed orally.    Estimated Nutritional Needs:  Kcal:  2000-2200 Protein:  95-110g Fluid:  2L/day     Trenton Gammon, MS, RD, LDN, CNSC Inpatient Clinical Dietitian RD pager # available in [facility]  After hours/weekend pager # available in [facility]

## 2020-11-27 DIAGNOSIS — R1310 Dysphagia, unspecified: Secondary | ICD-10-CM | POA: Diagnosis not present

## 2020-11-27 LAB — BASIC METABOLIC PANEL
Anion gap: 9 (ref 5–15)
BUN: 21 mg/dL (ref 8–23)
CO2: 30 mmol/L (ref 22–32)
Calcium: 10 mg/dL (ref 8.9–10.3)
Chloride: 97 mmol/L — ABNORMAL LOW (ref 98–111)
Creatinine, Ser: 1.1 mg/dL (ref 0.61–1.24)
GFR, Estimated: >60 mL/min (ref >60)
Glucose, Bld: 114 mg/dL — ABNORMAL HIGH (ref 70–99)
Potassium: 4.2 mmol/L (ref 3.5–5.1)
Sodium: 136 mmol/L (ref 135–145)

## 2020-11-27 LAB — GLUCOSE, CAPILLARY
Glucose-Capillary: 120 mg/dL — ABNORMAL HIGH (ref 70–99)
Glucose-Capillary: 115 mg/dL — ABNORMAL HIGH (ref 70–99)

## 2020-11-27 LAB — MAGNESIUM: Magnesium: 2 mg/dL (ref 1.7–2.4)

## 2020-11-27 LAB — PHOSPHORUS: Phosphorus: 3.5 mg/dL (ref 2.5–4.6)

## 2020-11-27 MED ORDER — INSULIN GLARGINE 100 UNIT/ML SOLOSTAR PEN: 20.0000 [IU] | PEN_INJECTOR | Freq: Every day | SUBCUTANEOUS | Status: AC

## 2020-11-27 MED ORDER — MAGIC MOUTHWASH W/LIDOCAINE: 10.0000 mL | Freq: Three times a day (TID) | ORAL | 2 refills | Status: AC

## 2020-11-27 MED ORDER — METOPROLOL SUCCINATE ER 25 MG PO TB24: 12.5000 mg | ORAL_TABLET | Freq: Every day | ORAL | Status: AC

## 2020-11-27 NOTE — Discharge Instructions (Signed)
[employer] will provide instructions for free water flushes with the tube feedings.  For fiber supplementation, use metamucil 2 teaspoons three times daily. Can be mixed into tube feeds or taken orally with liquids being ingested during the day.

## 2020-11-27 NOTE — Plan of Care (Signed)
Instructions were reviewed with patient. All questions were answered. Patient was transported to main entrance by wheelchair. ° °

## 2020-11-27 NOTE — Discharge Summary (Signed)
Physician Discharge Summary   Christopher [name]:[id number] DOB: Apr 22, 1951 DOA: 11/17/2020  PCP: Fanny Bien, MD  Admit date: 11/17/2020 Discharge date: 11/27/2020  Admitted From: home Disposition:  home Discharging physician: Dwyane Dee, MD  Recommendations for Outpatient Follow-up:  1. Continue TF   Patient discharged to home in Discharge Condition: stable CODE STATUS: DNR Diet recommendation:  Diet Orders (From admission, onward)    Start     Ordered   11/26/20 1301  Diet full liquid Room service appropriate? Yes; Fluid consistency: Thin  Diet effective now       Comments: Thin and nectar thick liquids ok.  Avoid solids/purees on tray.  Question Answer Comment  Room service appropriate? Yes   Fluid consistency: Thin      11/26/20 1303          Hospital Course: [name] is a 70 year old male with history of right tonsillar cancer, status post surgery, radiation, chemotherapy also has history of chronic dysphagia, esophageal stricture, HIV, CAD, CKD stage III, diabetes mellitus type 2, hypertension, hyperlipidemia, hypothyroidism, carotid artery stenosis s/p bilateral revascularization/stenting, depression/anxiety presented with worsening odynophagia, dysphagia along with coughing up blood.   He underwent EGD which showed mild benign-appearing esophageal stenosis and edema in the upper esophagus from XRT with severe nasopharyngeal mucositis.  IR was consulted for G-tube placement which was performed on 11/23/20.  He was started on tube feeds on 11/24/2020. He also underwent repeat SLP evaluation with plans for modified barium swallow on 11/26/2020.  Study revealed poor laryngeal vestibule closure.  He tolerates thin and nectar thick liquids but does not tolerate solid foods due to retention in the throat.  He was recommended to continue on long-term nutrition via tube feeds with pleasure feeding orally with thin or nectar thick liquids.  These recommendations were  discussed with patient and his significant other.  He was arranged for ongoing tube feeds at discharge.   No new Assessment & Plan notes have been filed under this hospital service since the last note was generated. Service: Hospitalist   The patient's chronic medical conditions were treated accordingly per the patient's home medication regimen except as noted.  On day of discharge, patient was felt deemed stable for discharge. Patient/family member advised to call PCP or come back to ER if needed.   Principal Diagnosis: Odynophagia  Discharge Diagnoses: Active Hospital Problems   Diagnosis Date Noted  . Odynophagia 11/17/2020  . Protein-calorie malnutrition, severe 11/20/2020  . Diabetes ([place]) 04/13/2020  . Acute kidney injury superimposed on CKD (Christopher [name]) 04/13/2020  . Hypothyroidism 11/01/2012  . Hyperlipidemia associated with type 2 diabetes mellitus (Christopher [name]) 01/03/2008  . Human immunodeficiency virus (HIV) disease (Christopher [name]) 12/01/2006  . Hypertension associated with diabetes (Christopher [name]) 12/01/2006    Resolved Hospital Problems  No resolved problems to display.    Discharge Instructions    Increase activity slowly   Complete by: As directed      Allergies as of 11/27/2020      Reactions   Codeine Other (See Comments)   "makes me crazy"   Sulfamethoxazole-trimethoprim Itching, Other (See Comments)   "makes me crazy"   Sulfonamide Derivatives Itching, Other (See Comments)   "makes me crazy"      Medication List    TAKE these medications   aspirin 81 MG EC tablet Take 1 tablet (81 mg total) by mouth daily.   benazepril 10 MG tablet Commonly known as: LOTENSIN Take 10 mg by mouth daily.   blood glucose meter kit  and supplies Dispense based on patient and insurance preference. Use up to four times daily as directed. (FOR ICD-10 E10.9, E11.9).   clonazePAM 1 MG tablet Commonly known as: KLONOPIN TAKE 1 TABLET 3 TIMES A DAY AS NEEDED FOR ANXIETY What changed: See the new  instructions.   clopidogrel 75 MG tablet Commonly known as: PLAVIX Take 1 tablet (75 mg total) by mouth daily.   Contour Next Test test strip Generic drug: glucose blood   diphenhydrAMINE 25 MG tablet Commonly known as: BENADRYL Take 50 mg by mouth at bedtime as needed for sleep.   fenofibrate 145 MG tablet Commonly known as: TRICOR TAKE ONE TABLET BY MOUTH DAILY   FLUoxetine 20 MG capsule Commonly known as: PROZAC TAKE ONE CAPSULE BY MOUTH DAILY   folic acid 1 MG tablet Commonly known as: FOLVITE Take 2 tablets (2 mg total) by mouth daily.   HumaLOG KwikPen 100 UNIT/ML KwikPen Generic drug: insulin lispro Inject 1-15 Units into the skin 3 (three) times daily as needed (BS). Per sliding scale   insulin glargine 100 UNIT/ML Solostar Pen Commonly known as: LANTUS Inject 20 Units into the skin daily at 10 pm.   Insulin Pen Needle 32G X 8 MM Misc Use as directed   levothyroxine 75 MCG tablet Commonly known as: SYNTHROID Take 75 mcg by mouth daily before breakfast.   magic mouthwash w/lidocaine Soln Take 10 mLs by mouth 4 (four) times daily -  before meals and at bedtime.   metoprolol succinate 25 MG 24 hr tablet Commonly known as: TOPROL-XL Take 0.5 tablets (12.5 mg total) by mouth daily.   rosuvastatin 20 MG tablet Commonly known as: CRESTOR Take 1 tablet (20 mg total) by mouth daily.   sertraline 100 MG tablet Commonly known as: ZOLOFT Take 100 mg by mouth daily.   temazepam 30 MG capsule Commonly known as: RESTORIL Take 30 mg by mouth at bedtime.   Triumeq 161-09-604 MG tablet Generic drug: abacavir-dolutegravir-lamiVUDine TAKE ONE TABLET BY MOUTH DAILY            Durable Medical Equipment  (From admission, onward)         Start     Ordered   11/26/20 1514  For home use only DME Tube feeding  Once       Comments: Bolus TF regimen: 1 carton (237 ml) Osmolite 1.2 x6/day with 120 ml free water before and 120 ml after each TF bolus.  Continuous  TF regimen: Osmolite 1.2 @ 90 ml/hr x18 hours/day with 100 ml free water every 3 hours during TF infusion.  Hybrid regimen: Osmolite 1.2 @ 70 ml/hr x12 hours with 100 ml free water every 3 hours during TF infusion + 1 carton (237 ml) Osmolite 1.2 x4/day with 60 ml free water before and 60 ml free water after each TF bolus.   11/26/20 1513   11/26/20 1513  For home use only DME Tube feeding pump  Once       Question:  Length of Need  Answer:  Lifetime   11/26/20 1513          Allergies  Allergen Reactions  . Codeine Other (See Comments)    "makes me crazy"  . Sulfamethoxazole-Trimethoprim Itching and Other (See Comments)    "makes me crazy"  . Sulfonamide Derivatives Itching and Other (See Comments)    "makes me crazy"    Consultations:   Discharge Exam: BP 105/65 (BP Location: Right Arm)   Pulse 74   Temp 98.1  F (36.7 C) (Oral)   Resp 16   Ht 5' 8.5" (1.74 m)   Wt 58.2 kg   SpO2 94%   BMI 19.23 kg/m  General appearance: alert, cooperative and no distress Head: Normocephalic, without obvious abnormality, atraumatic Eyes: EOMI Lungs: clear to auscultation bilaterally Heart: regular rate and rhythm and S1, S2 normal Abdomen: soft, NT, ND, BS present; G tube in place Extremities: no edema Skin: mobility and turgor normal Neurologic: Grossly normal  The results of significant diagnostics from this hospitalization (including imaging, microbiology, ancillary and laboratory) are listed below for reference.   Microbiology: Recent Results (from the past 240 hour(s))  Resp Panel by RT-PCR (Flu A&B, Covid) Nasopharyngeal Swab     Status: None   Collection Time: 11/17/20  9:10 PM   Specimen: Nasopharyngeal Swab; Nasopharyngeal(NP) swabs in vial transport medium  Result Value Ref Range Status   SARS Coronavirus 2 by RT PCR NEGATIVE NEGATIVE Final    Comment: (NOTE) SARS-CoV-2 target nucleic acids are NOT DETECTED.  The SARS-CoV-2 RNA is generally detectable in upper  respiratory specimens during the acute phase of infection. The lowest concentration of SARS-CoV-2 viral copies this assay can detect is 138 copies/mL. A negative result does not preclude SARS-Cov-2 infection and should not be used as the sole basis for treatment or other patient management decisions. A negative result may occur with  improper specimen collection/handling, submission of specimen other than nasopharyngeal swab, presence of viral mutation(s) within the areas targeted by this assay, and inadequate number of viral copies(<138 copies/mL). A negative result must be combined with clinical observations, patient history, and epidemiological information. The expected result is Negative.  Fact Sheet for Patients:  EntrepreneurPulse.com.au  Fact Sheet for Healthcare Providers:  IncredibleEmployment.be  This test is no t yet approved or cleared by the [place] FDA and  has been authorized for detection and/or diagnosis of SARS-CoV-2 by FDA under an Emergency Use Authorization (EUA). This EUA will remain  in effect (meaning this test can be used) for the duration of the COVID-19 declaration under Section 564(b)(1) of the Act, 21 U.S.C.section 360bbb-3(b)(1), unless the authorization is terminated  or revoked sooner.       Influenza A by PCR NEGATIVE NEGATIVE Final   Influenza B by PCR NEGATIVE NEGATIVE Final    Comment: (NOTE) The Xpert Xpress SARS-CoV-2/FLU/RSV plus assay is intended as an aid in the diagnosis of influenza from Nasopharyngeal swab specimens and should not be used as a sole basis for treatment. Nasal washings and aspirates are unacceptable for Xpert Xpress SARS-CoV-2/FLU/RSV testing.  Fact Sheet for Patients: EntrepreneurPulse.com.au  Fact Sheet for Healthcare Providers: IncredibleEmployment.be  This test is not yet approved or cleared by the [place] FDA and has been  authorized for detection and/or diagnosis of SARS-CoV-2 by FDA under an Emergency Use Authorization (EUA). This EUA will remain in effect (meaning this test can be used) for the duration of the COVID-19 declaration under Section 564(b)(1) of the Act, 21 U.S.C. section 360bbb-3(b)(1), unless the authorization is terminated or revoked.  Performed at [facility], [place] [address] [place],  [address]   Surgical pcr screen     Status: Abnormal   Collection Time: 11/23/20  5:50 AM   Specimen: Nasal Mucosa; Nasal Swab  Result Value Ref Range Status   MRSA, PCR NEGATIVE NEGATIVE Final   Staphylococcus aureus POSITIVE (A) NEGATIVE Final    Comment: (NOTE) The Xpert SA Assay (FDA approved for NASAL specimens in patients 22  years of age and older), is one component of a comprehensive surveillance program. It is not intended to diagnose infection nor to guide or monitor treatment. Performed at [facility], [place] [address] [place],  [address]      Labs: BNP (last 3 results) No results for input(s): BNP in the last 8760 hours. Basic Metabolic Panel: Recent Labs  Lab 11/22/20 0506 11/23/20 0545 11/24/20 1651 11/25/20 0554 11/25/20 0805 11/26/20 0601 11/27/20 0545  NA 136 139  --   --  135 134* 136  K 2.7* 4.0  --   --  3.6 3.7 4.2  CL 99 106  --   --  100 98 97*  CO2 28 27  --   --  26 27 30   GLUCOSE 122* 120*  --   --  126* 135* 114*  BUN 15 10  --   --  14 18 21   CREATININE 1.14 1.00  --   --  1.01 1.14 1.10  CALCIUM 8.7* 9.1  --   --  9.5 9.4 10.0  MG  --  1.8 1.9 1.8  --  2.0 2.0  PHOS 2.1*  --  1.6* 2.1*  --  3.2 3.5   Liver Function Tests: Recent Labs  Lab 11/22/20 0506  AST 52*  ALT 30  ALKPHOS 49  BILITOT 1.2  PROT 6.8  ALBUMIN 3.0*   No results for input(s): LIPASE, AMYLASE in the last 168 hours. No results for input(s): AMMONIA in the last 168 hours. CBC: No results for input(s): WBC, NEUTROABS, HGB, HCT,  MCV, PLT in the last 168 hours. Cardiac Enzymes: No results for input(s): CKTOTAL, CKMB, CKMBINDEX, TROPONINI in the last 168 hours. BNP: Invalid input(s): POCBNP CBG: Recent Labs  Lab 11/26/20 1703 11/26/20 1929 11/26/20 2353 11/27/20 0424 11/27/20 0734  GLUCAP 123* 113* 126* 120* 115*   D-Dimer No results for input(s): DDIMER in the last 72 hours. Hgb A1c No results for input(s): HGBA1C in the last 72 hours. Lipid Profile No results for input(s): CHOL, HDL, LDLCALC, TRIG, CHOLHDL, LDLDIRECT in the last 72 hours. Thyroid function studies No results for input(s): TSH, T4TOTAL, T3FREE, THYROIDAB in the last 72 hours.  Invalid input(s): FREET3 Anemia work up No results for input(s): VITAMINB12, FOLATE, FERRITIN, TIBC, IRON, RETICCTPCT in the last 72 hours. Urinalysis    Component Value Date/Time   COLORURINE YELLOW 11/17/2020 1846   APPEARANCEUR CLEAR 11/17/2020 1846   LABSPEC 1.017 11/17/2020 1846   PHURINE 5.0 11/17/2020 1846   GLUCOSEU NEGATIVE 11/17/2020 1846   HGBUR MODERATE (A) 11/17/2020 1846   BILIRUBINUR NEGATIVE 11/17/2020 1846   KETONESUR 20 (A) 11/17/2020 1846   PROTEINUR 30 (A) 11/17/2020 1846   NITRITE NEGATIVE 11/17/2020 1846   LEUKOCYTESUR NEGATIVE 11/17/2020 1846   Sepsis Labs Invalid input(s): PROCALCITONIN,  WBC,  LACTICIDVEN Microbiology Recent Results (from the past 240 hour(s))  Resp Panel by RT-PCR (Flu A&B, Covid) Nasopharyngeal Swab     Status: None   Collection Time: 11/17/20  9:10 PM   Specimen: Nasopharyngeal Swab; Nasopharyngeal(NP) swabs in vial transport medium  Result Value Ref Range Status   SARS Coronavirus 2 by RT PCR NEGATIVE NEGATIVE Final    Comment: (NOTE) SARS-CoV-2 target nucleic acids are NOT DETECTED.  The SARS-CoV-2 RNA is generally detectable in upper respiratory specimens during the acute phase of infection. The lowest concentration of SARS-CoV-2 viral copies this assay can detect is 138 copies/mL. A negative result  does not preclude SARS-Cov-2 infection and  should not be used as the sole basis for treatment or other patient management decisions. A negative result may occur with  improper specimen collection/handling, submission of specimen other than nasopharyngeal swab, presence of viral mutation(s) within the areas targeted by this assay, and inadequate number of viral copies(<138 copies/mL). A negative result must be combined with clinical observations, patient history, and epidemiological information. The expected result is Negative.  Fact Sheet for Patients:  EntrepreneurPulse.com.au  Fact Sheet for Healthcare Providers:  IncredibleEmployment.be  This test is no t yet approved or cleared by the [place] FDA and  has been authorized for detection and/or diagnosis of SARS-CoV-2 by FDA under an Emergency Use Authorization (EUA). This EUA will remain  in effect (meaning this test can be used) for the duration of the COVID-19 declaration under Section 564(b)(1) of the Act, 21 U.S.C.section 360bbb-3(b)(1), unless the authorization is terminated  or revoked sooner.       Influenza A by PCR NEGATIVE NEGATIVE Final   Influenza B by PCR NEGATIVE NEGATIVE Final    Comment: (NOTE) The Xpert Xpress SARS-CoV-2/FLU/RSV plus assay is intended as an aid in the diagnosis of influenza from Nasopharyngeal swab specimens and should not be used as a sole basis for treatment. Nasal washings and aspirates are unacceptable for Xpert Xpress SARS-CoV-2/FLU/RSV testing.  Fact Sheet for Patients: EntrepreneurPulse.com.au  Fact Sheet for Healthcare Providers: IncredibleEmployment.be  This test is not yet approved or cleared by the [place] FDA and has been authorized for detection and/or diagnosis of SARS-CoV-2 by FDA under an Emergency Use Authorization (EUA). This EUA will remain in effect (meaning this test can be used) for  the duration of the COVID-19 declaration under Section 564(b)(1) of the Act, 21 U.S.C. section 360bbb-3(b)(1), unless the authorization is terminated or revoked.  Performed at [facility], [place] [address], [place], [place] [address]   Surgical pcr screen     Status: Abnormal   Collection Time: 11/23/20  5:50 AM   Specimen: Nasal Mucosa; Nasal Swab  Result Value Ref Range Status   MRSA, PCR NEGATIVE NEGATIVE Final   Staphylococcus aureus POSITIVE (A) NEGATIVE Final    Comment: (NOTE) The Xpert SA Assay (FDA approved for NASAL specimens in patients 34 years of age and older), is one component of a comprehensive surveillance program. It is not intended to diagnose infection nor to guide or monitor treatment. Performed at [facility], [place] [address] [place], [place] [address]     Procedures/Studies: CT ABDOMEN WO CONTRAST  Result Date: 11/20/2020 CLINICAL DATA:  Difficulty swallowing, dysphagia and history of head and neck carcinoma with prior gastrostomy tube placement in 2011. Evaluation for possible gastrostomy tube placement. EGD yesterday demonstrated normal stomach. EXAM: CT ABDOMEN WITHOUT CONTRAST TECHNIQUE: Multidetector CT imaging of the abdomen was performed following the standard protocol without IV contrast. COMPARISON:  Right upper quadrant abdominal ultrasound of 04/13/2020 FINDINGS: Lower chest: Mild atelectasis at the posterior right lung base. Hepatobiliary: Unremarkable unenhanced appearance of the liver. Multiple calcified gallstones in a nondistended gallbladder. No biliary ductal dilatation. Pancreas: Unremarkable. No pancreatic ductal dilatation or surrounding inflammatory changes. Spleen: Moderate splenomegaly. Adrenals/Urinary Tract: No adrenal masses. No hydronephrosis or visible renal calculi. Benign cyst of the lower left kidney measures approximately 2.2 cm in greatest diameter. Stomach/Bowel: No hiatal hernia. The stomach  is under distended and normal in position. The stomach lie superior to the transverse colon. No evidence of bowel obstruction, ileus or inflammation. No free intraperitoneal air. Vascular/Lymphatic:  Aortic atherosclerosis without aneurysm. No enlarged abdominal lymph nodes. Other: No abdominal wall hernia, ascites, abnormal fluid collections or body wall edema. Musculoskeletal: No acute or significant osseous findings. IMPRESSION: 1. No acute findings in the abdomen or pelvis. 2. Normal gastric positioning and no prohibitive anatomy to percutaneous gastrostomy tube placement. 3. Moderate splenomegaly. 4. Cholelithiasis. 5. Aortic atherosclerosis. Aortic Atherosclerosis (ICD10-I70.0). Electronically Signed   By: Aletta Edouard M.D.   On: 11/20/2020 12:07   CT Soft Tissue Neck W Contrast  Result Date: 11/17/2020 CLINICAL DATA:  Difficulty swallowing. EXAM: CT NECK WITH CONTRAST TECHNIQUE: Multidetector CT imaging of the neck was performed using the standard protocol following the bolus administration of intravenous contrast. CONTRAST:  5m OMNIPAQUE IOHEXOL 300 MG/ML  SOLN COMPARISON:  10/31/2011 FINDINGS: PHARYNX AND LARYNX: There is mild edema of the epiglottis and supraglottic larynx. The airway is clearly patent. The nasopharynx and oropharynx are normal. SALIVARY GLANDS: Absence or severe atrophy of the submandibular glands. Parotid glands are unremarkable. THYROID: Normal. LYMPH NODES: No enlarged or abnormal density lymph nodes. VASCULAR: Bilateral carotid stents. LIMITED INTRACRANIAL: Normal. VISUALIZED ORBITS: Normal. MASTOIDS AND VISUALIZED PARANASAL SINUSES: No fluid levels or advanced mucosal thickening. No mastoid effusion. SKELETON: No bony spinal canal stenosis. No lytic or blastic lesions. UPPER CHEST: Clear. OTHER: None. IMPRESSION: 1. Mild edema of the epiglottis and supraglottic larynx, which may be due to prior radiation therapy. Acute supraglottitis may also cause this appearance. The  airway is clearly patent. 2. Post treatment changes in the neck without cervical lymphadenopathy. No visible pharyngeal mass. Aortic atherosclerosis (ICD10-I70.0). Electronically Signed   By: KUlyses JarredM.D.   On: 11/17/2020 20:29   IR GASTROSTOMY TUBE MOD SED  Result Date: 11/23/2020 INDICATION: History of head neck cancer, now with recurrent dysphagia. Please perform percutaneous gastrostomy tube placement for enteric nutrition supplementation purposes. Note, patient with history of previous gastrostomy tube placed 07/07/2010, removed on 11/19/2010 following completion of chemoradiation. EXAM: PULL TROUGH GASTROSTOMY TUBE PLACEMENT COMPARISON:  CT abdomen pelvis-11/20/2020; percutaneous gastrostomy tube placement-07/07/2010 MEDICATIONS: Ancef 2 gm IV; Antibiotics were administered within 1 hour of the procedure. Glucagon 1 mg IV CONTRAST:  20 mL of Omnipaque 300 administered into the gastric lumen. ANESTHESIA/SEDATION: Moderate (conscious) sedation was employed during this procedure. A total of Versed 2 mg and Fentanyl 100 mcg was administered intravenously. Moderate Sedation Time: 10 minutes. The patient's level of consciousness and vital signs were monitored continuously by radiology nursing throughout the procedure under my direct supervision. FLUOROSCOPY TIME:  1 minute, 18 seconds (9 mGy) COMPLICATIONS: None immediate. PROCEDURE: Informed written consent was obtained from the patient following explanation of the procedure, risks, benefits and alternatives. A time out was performed prior to the initiation of the procedure. Ultrasound scanning was performed to demarcate the edge of the left lobe of the liver. Maximal barrier sterile technique utilized including caps, mask, sterile gowns, sterile gloves, large sterile drape, hand hygiene and Betadine prep. The left upper quadrant was sterilely prepped and draped. An oral gastric catheter was inserted into the stomach under fluoroscopy. The left costal  margin and air opacified transverse colon were identified and avoided. Air was injected into the stomach for insufflation and visualization under fluoroscopy. Under sterile conditions a 17 gauge trocar needle was utilized to access the stomach percutaneously beneath the left subcostal margin at the location of previous gastrostomy tube after the overlying soft tissues were anesthetized with 1% Lidocaine with epinephrine. Needle position was confirmed within the stomach with aspiration of air and  injection of small amount of contrast. A single T tack was deployed for gastropexy. Over an Amplatz guide wire, a 9-French sheath was inserted into the stomach. A snare device was utilized to capture the oral gastric catheter. The snare device was pulled retrograde from the stomach up the esophagus and out the oropharynx. The 20-French pull-through gastrostomy was connected to the snare device and pulled antegrade through the oropharynx down the esophagus into the stomach and then through the percutaneous tract external to the patient. The gastrostomy was assembled externally. Contrast injection confirms position in the stomach. Several spot radiographic images were obtained in various obliquities for documentation. The patient tolerated procedure well without immediate post procedural complication. FINDINGS: After successful fluoroscopic guided placement, the gastrostomy tube is appropriately positioned with internal disc against the ventral aspect of the gastric lumen. IMPRESSION: Successful fluoroscopic insertion of a 20-French pull-through gastrostomy tube. The gastrostomy may be used immediately for medication administration and in 24 hrs for the initiation of feeds. Electronically Signed   By: Sandi Mariscal M.D.   On: 11/23/2020 14:03   DG Swallowing Func-Speech Pathology  Result Date: 11/26/2020 Objective Swallowing Evaluation: Type of Study: MBS-Modified Barium Swallow Study  Patient Details Name: Christopher [name]  MRN: [id number] Date of Birth: [birth year] Today's Date: 11/26/2020 Time: SLP Start Time (ACUTE ONLY): 1135 -SLP Stop Time (ACUTE ONLY): 1200 SLP Time Calculation (min) (ACUTE ONLY): 25 min Past Medical History: Past Medical History: Diagnosis Date . Anxiety  . Cataract  . Chronic kidney disease  . 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  . Diabetes mellitus without complication ([place])  . GERD (gastroesophageal reflux disease)  . Heart murmur   CHILDHOOD . History of kidney stones  . HIV DISEASE 12/01/2006 . HYPERLIPIDEMIA, WITH LOW HDL 01/03/2008 . Hypertension  . HYPERTENSION 12/01/2006 . HYPOGONADISM 12/23/2009 . Hypothyroidism  . Neuromuscular disorder ([place])  . SYPHILIS, [place], LATENT NOS 01/18/2007 . Thyroid disease  . Tonsillar cancer ([place])   tonsillar ca  . Tuberculosis   history of TB about 30 years ago Past Surgical History: Past Surgical History: Procedure Laterality Date . COLONOSCOPY   . CORONARY ANGIOGRAPHY N/A 05/05/2020  Procedure: CORONARY ANGIOGRAPHY;  Surgeon: Nigel Mormon, MD;  Location: [place] [facility];  Service: Cardiovascular;  Laterality: N/A; . ESOPHAGOGASTRODUODENOSCOPY (EGD) WITH PROPOFOL N/A 11/19/2020  Procedure: ESOPHAGOGASTRODUODENOSCOPY (EGD) WITH PROPOFOL;  Surgeon: Gatha Mayer, MD;  Location: [facility];  Service: Endoscopy;  Laterality: N/A;  Possible esophageal dilatation . IR GASTROSTOMY TUBE MOD SED  11/23/2020 . TONSILECTOMY, ADENOIDECTOMY, BILATERAL MYRINGOTOMY AND TUBES    tonsil cancer  . TONSILLECTOMY   . TRANSCAROTID ARTERY REVASCULARIZATION Right 05/18/2020  Procedure: RIGHT TRANSCAROTID ARTERY REVASCULARIZATION;  Surgeon: Elam Dutch, MD;  Location: [place];  Service: Vascular;  Laterality: Right; . TRANSCAROTID ARTERY REVASCULARIZATION Left 06/22/2020  Procedure: LEFT  TRANSCAROTID ARTERY REVASCULARIZATION;  Surgeon: Elam Dutch, MD;  Location: [place];  Service: Vascular;  Laterality: Left; . ULTRASOUND GUIDANCE FOR VASCULAR ACCESS Right 06/22/2020  Procedure: ULTRASOUND GUIDANCE FOR VASCULAR ACCESS;  Surgeon: Elam Dutch, MD;  Location: [facility];  Service: Vascular;  Laterality: Right; . UPPER GASTROINTESTINAL ENDOSCOPY   HPI: 70 year old male with medical history of right tonsillar cancer status post surgery, radiation and chemotherapy, chronic dysphagia, esophageal stricture, HIV, CAD, CKD stage III, diabetes  mellitus type 2, HTN, hypothyroidism, carotid artery stenosis s/p bilateral revascularization/stenting, depression/anxiety presented with worsening odynophagia, dysphagia along with coughing up blood.  Subjective: pleasant, alert, oriented Assessment / Plan / Recommendation CHL IP CLINICAL IMPRESSIONS 11/26/2020 Clinical Impression Pt presents with a chronic and progressive radiation-induced dysphagia.  The fibrosis has led to changes in muscle composition and function, resulting in limited mobility of all the pharyngeal and laryngeal structures.  There is limited propulsion of solids through the pharynx, leading to accumulation of solid foods and difficulty passing through UES. Thin and nectar thick liquids pass more easily through the pharynx and UES, but tend to spill into the larynx and may reach the vocal folds due to poor laryngeal vestibule closure.  Pt has a spontaneous cough/throat-clearing response which is effective in expelling penetrants.  Aspiration was not observed on this study.  Given new PEG for primary nutrition, recommend that pt consume liquids for pleasure (thin or nectar thick).  It may be best to avoid solid foods due to retention in throat.  Study was reviewed in real time by pt, who verbalized agreement.  After exam, I phoned Retta Diones, Mr. Abernethy husband, and reviewed nature of swallowing deficits, recommendations for PO intake, and  likelihood that swallowing deficits will not improve.  Mr. Mayer Masker verbalized understanding. SLP Visit Diagnosis Dysphagia, oropharyngeal phase (R13.12) Attention and concentration deficit following -- Frontal lobe and executive function deficit following -- Impact on safety and function Moderate aspiration risk   CHL IP TREATMENT RECOMMENDATION 11/26/2020 Treatment Recommendations No treatment recommended at this time   Prognosis 11/25/2020 Prognosis for Safe Diet Advancement Fair Barriers to Reach Goals Other (Comment) Barriers/Prognosis Comment fair prognosis as patient to be discharging home in next couple days and his dysphagia is chronic in nature CHL IP DIET RECOMMENDATION 11/26/2020 SLP Diet Recommendations Thin liquid;Nectar thick liquid Liquid Administration via Cup Medication Administration Via alternative means Compensations Small sips/bites Postural Changes --   CHL IP OTHER RECOMMENDATIONS 11/26/2020 Recommended Consults -- Oral Care Recommendations Oral care BID Other Recommendations --   CHL IP FOLLOW UP RECOMMENDATIONS 11/26/2020 Follow up Recommendations None   CHL IP FREQUENCY AND DURATION 11/25/2020 Speech Therapy Frequency (ACUTE ONLY) min 1 x/week Treatment Duration 1 week      CHL IP ORAL PHASE 11/26/2020 Oral Phase Impaired Oral - Pudding Teaspoon -- Oral - Pudding Cup -- Oral - Honey Teaspoon -- Oral - Honey Cup -- Oral - Nectar Teaspoon -- Oral - Nectar Cup Weak lingual manipulation;Lingual pumping;Incomplete tongue to palate contact;Reduced posterior propulsion;Decreased bolus cohesion;Premature spillage Oral - Nectar Straw -- Oral - Thin Teaspoon -- Oral - Thin Cup Weak lingual manipulation;Lingual pumping;Incomplete tongue to palate contact;Reduced posterior propulsion;Decreased bolus cohesion;Premature spillage Oral - Thin Straw -- Oral - Puree Weak lingual manipulation;Lingual pumping;Incomplete tongue to palate contact;Reduced posterior propulsion;Decreased bolus cohesion Oral - Mech Soft -- Oral -  Regular -- Oral - Multi-Consistency -- Oral - Pill -- Oral Phase - Comment --  CHL IP PHARYNGEAL PHASE 11/26/2020 Pharyngeal Phase Impaired Pharyngeal- Pudding Teaspoon -- Pharyngeal -- Pharyngeal- Pudding Cup -- Pharyngeal -- Pharyngeal- Honey Teaspoon -- Pharyngeal -- Pharyngeal- Honey Cup -- Pharyngeal -- Pharyngeal- Nectar Teaspoon -- Pharyngeal -- Pharyngeal- Nectar Cup Delayed swallow initiation-pyriform sinuses;Reduced pharyngeal peristalsis;Reduced epiglottic inversion;Reduced anterior laryngeal mobility;Reduced laryngeal elevation;Reduced airway/laryngeal closure;Reduced tongue base retraction;Penetration/Aspiration before swallow;Penetration/Aspiration during swallow;Pharyngeal residue - valleculae Pharyngeal Material enters airway, CONTACTS cords and then ejected out;Material enters airway, CONTACTS cords and not ejected out Pharyngeal- Nectar Straw -- Pharyngeal -- Pharyngeal- Thin  Teaspoon -- Pharyngeal -- Pharyngeal- Thin Cup Delayed swallow initiation-pyriform sinuses;Reduced pharyngeal peristalsis;Reduced epiglottic inversion;Reduced anterior laryngeal mobility;Reduced laryngeal elevation;Reduced airway/laryngeal closure;Reduced tongue base retraction;Penetration/Aspiration before swallow;Penetration/Aspiration during swallow;Pharyngeal residue - valleculae Pharyngeal Material enters airway, CONTACTS cords and then ejected out;Material enters airway, CONTACTS cords and not ejected out Pharyngeal- Thin Straw -- Pharyngeal -- Pharyngeal- Puree Delayed swallow initiation-vallecula;Reduced pharyngeal peristalsis;Reduced epiglottic inversion;Reduced anterior laryngeal mobility;Reduced laryngeal elevation;Reduced airway/laryngeal closure;Reduced tongue base retraction;Pharyngeal residue - valleculae;Pharyngeal residue - pyriform Pharyngeal -- Pharyngeal- Mechanical Soft -- Pharyngeal -- Pharyngeal- Regular -- Pharyngeal -- Pharyngeal- Multi-consistency -- Pharyngeal -- Pharyngeal- Pill -- Pharyngeal --  Pharyngeal Comment --  CHL IP CERVICAL ESOPHAGEAL PHASE 11/26/2020 Cervical Esophageal Phase (No Data) Pudding Teaspoon -- Pudding Cup -- Honey Teaspoon -- Honey Cup -- Nectar Teaspoon -- Nectar Cup -- Nectar Straw -- Thin Teaspoon -- Thin Cup -- Thin Straw -- Puree -- Mechanical Soft -- Regular -- Multi-consistency -- Pill -- Cervical Esophageal Comment -- Juan Quam [name] 11/26/2020, 12:58 PM                Time coordinating discharge: Over 30 minutes    Dwyane Dee, MD  [facility] 11/27/2020, 12:11 PM

## 2020-11-27 NOTE — Progress Notes (Signed)
Physical Therapy Treatment Patient Details Name: Christopher [name] MRN: [id number] DOB: [birth year] Today's Date: 11/27/2020    History of Present Illness 70 year old male with medical history of right tonsillar cancer status post surgery, radiation and chemotherapy, chronic dysphagia, esophageal stricture, HIV, CAD, CKD stage III, diabetes mellitus type 2, HTN, hypothyroidism, carotid artery stenosis s/p bilateral revascularization/stenting, depression/anxiety presented with worsening odynophagia, dysphagia along with coughing up blood.s/p G tube placement this admission    PT Comments    Pt with increased tolerance to ambulation, educated on benefit of RW for distance and improved steadiness and pt agreeable to try. Pt ambulates with RW in hallway, more steady vs using IV pole in room, but ultimately declines RW recommendation at home. Pt slow to mobilize into/out of bed and complete transfers, cues for hand placement and no physical assistance. Continue to recommend HHPT for further strengthening once home but pt politely declines.   Follow Up Recommendations  Supervision - Intermittent;Home health PT (pt declines HHPT f/u)     Equipment Recommendations  None recommended by PT    Recommendations for Other Services       Precautions / Restrictions Precautions Precautions: Fall Precaution Comments: G tube Restrictions Weight Bearing Restrictions: No    Mobility  Bed Mobility Overal bed mobility: Needs AssistanceBed Mobility: Supine to Sit;Sit to Supine  Supine to sit: Supervision Sit to supine: Supervision   General bed mobility comments: increased time, use of bedrail to upright trunk and scoot out to bed, cues for hand placement and positioning with return to supine  Transfers Overall transfer level: Needs assistance Equipment used: None Transfers: Sit to/from Stand Sit to Stand: Min guard    General transfer comment: increased time, wide BOS, BUE assisting to power up,  back of BLE braced on front of bed  Ambulation/Gait Ambulation/Gait assistance: Min guard Gait Distance (Feet): 150 Feet Assistive device: Rolling walker (2 wheeled);IV Pole Gait Pattern/deviations: Step-through pattern;Decreased stride length Gait velocity: decreased   General Gait Details: initial steps in room with IV pole and slightly unsteady gait, provided RW for improved support, pt able to ambulate around room and in hallway with RW, completes wide turns, ultimately declines RW recommendation upon discharge at end of gait training   Stairs             Wheelchair Mobility    Modified Rankin (Stroke Patients Only)       Balance Overall balance assessment: Needs assistance  Standing balance support: During functional activity;Bilateral upper extremity supported Standing balance-Leahy Scale: Poor Standing balance comment: reliant on UE support       Cognition Arousal/Alertness: Awake/alert Behavior During Therapy: WFL for tasks assessed/performed Overall Cognitive Status: Within Functional Limits for tasks assessed       Exercises      General Comments        Pertinent Vitals/Pain Pain Assessment: No/denies pain    Home Living                      Prior Function            PT Goals (current goals can now be found in the care plan section) Acute Rehab PT Goals Patient Stated Goal: return home with spouse to assist, no HHPT PT Goal Formulation: With patient Time For Goal Achievement: 12/07/20 Potential to Achieve Goals: Good Progress towards PT goals: Progressing toward goals    Frequency    Min 3X/week      PT Plan Current plan remains  appropriate    Co-evaluation              AM-PAC PT "6 Clicks" Mobility   Outcome Measure  Help needed turning from your back to your side while in a flat bed without using bedrails?: None Help needed moving from lying on your back to sitting on the side of a flat bed without using  bedrails?: None Help needed moving to and from a bed to a chair (including a wheelchair)?: A Little Help needed standing up from a chair using your arms (e.g., wheelchair or bedside chair)?: A Little Help needed to walk in hospital room?: A Little Help needed climbing 3-5 steps with a railing? : A Little 6 Click Score: 20    End of Session Equipment Utilized During Treatment: Gait belt Activity Tolerance: Patient tolerated treatment well Patient left: in bed;with call bell/phone within reach;with family/visitor present (MD in room) Nurse Communication: Mobility status PT Visit Diagnosis: Muscle weakness (generalized) (M62.81);Unsteadiness on feet (R26.81)     Time: 2536-6440 PT Time Calculation (min) (ACUTE ONLY): 18 min  Charges:  $Gait Training: 8-22 mins                      Tori Sareena Odeh PT, DPT 11/27/20, 1:04 PM

## 2020-11-27 NOTE — TOC Transition Note (Signed)
Transition of Care [facility]) - CM/SW Discharge Note   Patient Details  Name: Christopher [name] MRN: [id number] Date of Birth: [birth year]  [facility] CM/SW Contact:  Lennart Pall, LCSW Phone Number: 11/27/2020, 3:43 PM   Clinical Narrative:    Pt medically ready for dc today.  Tube feeling supplies delivered to home via [employer].  Have contacted all [facility] agencies covering this service area to refer for [facility] and unable to secure any agency who can provide services mostly due to being out of network with pt's [employer] (which is actually the primary ins payor).  Have alerted pt/ spouse and encouraged them to seek medical care if the peg site appears concerning. A RD with [employer] will follow for management / concerns for tube feedings.   Final next level of care: Home/Self Care Barriers to Discharge: No [place] will accept this patient,Barriers Resolved   Patient Goals and CMS Choice Patient states their goals for this hospitalization and ongoing recovery are:: go home      Discharge Placement                       Discharge Plan and Services     Post Acute Care Choice: Home Health          DME Arranged: Tube feeding,Tube feeding pump DME Agency: [employer] Date DME Agency Contacted: 11/26/20 Time DME Agency Contacted: 1400 Representative spoke with at DME Agency: [place] ([place]) Interventions     Readmission Risk Interventions No flowsheet data found.

## 2020-11-30 ENCOUNTER — Ambulatory Visit: Payer: PRIVATE HEALTH INSURANCE | Admitting: Cardiology

## 2020-12-23 ENCOUNTER — Other Ambulatory Visit: Payer: Self-pay | Admitting: Gastroenterology

## 2020-12-25 ENCOUNTER — Ambulatory Visit: Payer: No Typology Code available for payment source | Admitting: Nurse Practitioner

## 2021-01-19 ENCOUNTER — Other Ambulatory Visit: Payer: Self-pay

## 2021-01-19 ENCOUNTER — Telehealth: Payer: Self-pay

## 2021-01-19 DIAGNOSIS — B20 Human immunodeficiency virus [HIV] disease: Secondary | ICD-10-CM

## 2021-01-19 MED ORDER — TRIUMEQ 600-50-300 MG PO TABS: 1.0000 | ORAL_TABLET | Freq: Every day | ORAL | 0 refills | Status: DC

## 2021-01-19 NOTE — Telephone Encounter (Signed)
PA approved. Patient's pharmacy notified.   Rion Schnitzer Lorita Officer, RN

## 2021-01-19 NOTE — Telephone Encounter (Signed)
PA initiated on patient's triumeq. Faxed PA form and office notes to [phone]. Awaiting response.   Aoi Kouns Lorita Officer, RN

## 2021-01-21 ENCOUNTER — Other Ambulatory Visit: Payer: Self-pay

## 2021-01-21 ENCOUNTER — Ambulatory Visit (INDEPENDENT_AMBULATORY_CARE_PROVIDER_SITE_OTHER): Payer: PRIVATE HEALTH INSURANCE | Admitting: Internal Medicine

## 2021-01-21 ENCOUNTER — Encounter: Payer: Self-pay | Admitting: Internal Medicine

## 2021-01-21 DIAGNOSIS — E440 Moderate protein-calorie malnutrition: Secondary | ICD-10-CM | POA: Diagnosis not present

## 2021-01-21 DIAGNOSIS — B20 Human immunodeficiency virus [HIV] disease: Secondary | ICD-10-CM | POA: Diagnosis not present

## 2021-01-21 MED ORDER — TRIUMEQ 600-50-300 MG PO TABS: 1.0000 | ORAL_TABLET | Freq: Every day | ORAL | 3 refills | Status: AC

## 2021-01-21 NOTE — Addendum Note (Signed)
Addended by: Daisy Floro T on: 01/21/2021 10:17 AM   Modules accepted: Orders

## 2021-01-21 NOTE — Assessment & Plan Note (Signed)
He has worsening malnutrition.  I will make a referral for nutrition counseling since Christopher [name] is uncertain what is best to use for hydration and feeding.

## 2021-01-21 NOTE — Assessment & Plan Note (Signed)
Expect that his infection remains under very good control.  He will get repeat blood work today.  [name] has been having some difficulty cutting and crushing the Triumeq to get it down the feeding tube but doubly does not want to make a change to [employer].  He will continue Triumeq for now and follow-up in 3 months.

## 2021-01-21 NOTE — Progress Notes (Signed)
Patient Active Problem List   Diagnosis Date Noted  . Malnutrition of moderate degree 04/16/2020    Priority: High  . Diabetes ([place]) 04/13/2020    Priority: High  . Peripheral neuropathy 09/30/2013    Priority: High  . Human immunodeficiency virus (HIV) disease ([name]) 12/01/2006    Priority: High  . Depression 10/01/2013    Priority: Medium  . Renal insufficiency 08/19/2013    Priority: Medium  . 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 Protein-calorie malnutrition, severe 11/20/2020  . Odynophagia 11/17/2020  . Hypothyroid 11/17/2020  . Dysphagia 07/15/2020  . Thrush 07/15/2020  . Carotid stenosis 06/22/2020  . Carotid stenosis, asymptomatic, right 05/18/2020  . Abnormal stress test 05/04/2020  . Hyperosmolar hyperglycemic state (HHS) ([place]) 04/13/2020  . Head injury 04/13/2020  . Diarrhea 04/13/2020  . Acute kidney injury superimposed on CKD ([place]) 04/13/2020  . Hypothyroidism 11/01/2012  . Hyperglycemia 12/08/2011  . HYPOGONADISM 12/23/2009  . Hyperlipidemia associated with type 2 diabetes mellitus ([place]) 01/03/2008  . SYPHILIS, [place], LATENT NOS 01/18/2007  . Hypertension associated with diabetes ([place]) 12/01/2006  . PROTEINURIA 12/01/2006  . HIV DISEASE 12/01/2006    Patient's Medications  New Prescriptions   No medications on file  Previous Medications   ASPIRIN EC 81 MG EC TABLET    Take 1 tablet (81 mg total) by mouth daily.   BENAZEPRIL (LOTENSIN) 10 MG TABLET    Take 10 mg by mouth daily.    BLOOD GLUCOSE METER KIT AND SUPPLIES    Dispense based on patient and insurance preference. Use up to four times daily as directed. (FOR ICD-10 E10.9, E11.9).   CLONAZEPAM (KLONOPIN) 1 MG TABLET    TAKE 1 TABLET 3 TIMES A DAY AS NEEDED FOR ANXIETY   CLOPIDOGREL (PLAVIX) 75 MG TABLET    Take 1 tablet (75 mg total) by mouth daily.   CONTOUR NEXT TEST TEST STRIP       DIPHENHYDRAMINE  (BENADRYL) 25 MG TABLET    Take 50 mg by mouth at bedtime as needed for sleep.   DOLUTEGRAVIR SODIUM (TIVICAY) 50 MG TABS    Take 1 tablet (50 mg total) by mouth daily.   FENOFIBRATE (TRICOR) 145 MG TABLET    TAKE ONE TABLET BY MOUTH DAILY   FLUOXETINE (PROZAC) 20 MG CAPSULE    TAKE ONE CAPSULE BY MOUTH DAILY   FOLIC ACID (FOLVITE) 1 MG TABLET    Take 2 tablets (2 mg total) by mouth daily.   HUMALOG KWIKPEN 100 UNIT/ML KWIKPEN    Inject 1-15 Units into the skin 3 (three) times daily as needed (BS). Per sliding scale   INSULIN GLARGINE (LANTUS) 100 UNIT/ML SOLOSTAR PEN    Inject 20 Units into the skin daily at 10 pm.   INSULIN PEN NEEDLE 32G X 8 MM MISC    Use as directed   LEVOTHYROXINE (SYNTHROID) 75 MCG TABLET    Take 75 mcg by mouth daily before breakfast.   MAGIC MOUTHWASH W/LIDOCAINE SOLN    Take 10 mLs by mouth 4 (four) times daily -  before meals and at bedtime.   METOCLOPRAMIDE HCL 5 MG TBDP    Take 5 mg by mouth daily.   METOPROLOL SUCCINATE (TOPROL-XL) 25 MG 24 HR TABLET    Take 0.5 tablets (12.5 mg total) by mouth daily.   PANTOPRAZOLE (PROTONIX) 40 MG TABLET  TAKE ONE TABLET BY MOUTH DAILY   ROSUVASTATIN (CRESTOR) 20 MG TABLET    Take 1 tablet (20 mg total) by mouth daily.   SERTRALINE (ZOLOFT) 100 MG TABLET    Take 100 mg by mouth daily.   SERTRALINE (ZOLOFT) 20 MG/ML CONCENTRATED SOLUTION    Take 20 mg by mouth daily.   TEMAZEPAM (RESTORIL) 30 MG CAPSULE    Take 30 mg by mouth at bedtime.  Modified Medications   Modified Medication Previous Medication   ABACAVIR-DOLUTEGRAVIR-LAMIVUDINE (TRIUMEQ) 600-50-300 MG TABLET abacavir-dolutegravir-lamiVUDine (TRIUMEQ) 600-50-300 MG tablet      Take 1 tablet by mouth daily.    Take 1 tablet by mouth daily.  Discontinued Medications   No medications on file    Subjective: Christopher [name] he is in for his routine HIV follow-up visit.  He was hospitalized in late December with dysphagia and odynophagia.  He had been having increasing problems  swallowing and was found to have a worsening esophageal stricture and mucositis.  He has been having problems swallowing his Triumeq but denies having missed any doses before he was hospitalized.  He had a feeding tube placed.  His partner, Christopher [name], has been crushing Triumeq and putting it down the feeding tube.  He ran out of Triumeq about 1 week ago and has been taking Christopher [name]'s Biktarvy.  He has been feeling much weaker.  He has been able to gain a few pounds since he was discharged.  He has chronic numbness in his legs and feet and says that he feels more stable when up walking.  He has a walker at home but does not use it because of the cramped spaces in their house.  Review of Systems: Review of Systems  Constitutional: Positive for malaise/fatigue and weight loss.  Gastrointestinal: Positive for heartburn. Negative for abdominal pain, diarrhea, nausea and vomiting.    Past Medical History:  Diagnosis Date  . Anxiety   . Cataract   . Chronic kidney disease   . 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   . Diabetes mellitus without complication ([place])   . GERD (gastroesophageal reflux disease)   . Heart murmur    CHILDHOOD  . History of kidney stones   . HIV DISEASE 12/01/2006  . HYPERLIPIDEMIA, WITH LOW HDL 01/03/2008  . Hypertension   . HYPERTENSION 12/01/2006  . HYPOGONADISM 12/23/2009  . Hypothyroidism   . Neuromuscular disorder ([place])   . SYPHILIS, [place], LATENT NOS 01/18/2007  . Thyroid disease   . Tonsillar cancer ([place])    tonsillar ca   . Tuberculosis    history of TB about 30 years ago    Social History   Tobacco Use  . Smoking status: Former Research scientist (life sciences)  . Smokeless tobacco: Never Used  . Tobacco comment: quit when he was 70 years old.  Vaping Use  . Vaping Use: Never used  Substance Use Topics   . Alcohol use: Yes    Alcohol/week: 2.0 standard drinks    Types: 2 Shots of liquor per week    Comment: "COUPLE TIMES A WEEK"  . Drug use: No    Comment: 40 years ago used multiple drugs    Family History  Problem Relation Age of Onset  . Diabetes Mother   . COPD Mother   . Heart attack Father   .  Colon cancer Neg Hx   . Colon polyps Neg Hx   . Esophageal cancer Neg Hx   . Rectal cancer Neg Hx   . Stomach cancer Neg Hx     Allergies  Allergen Reactions  . Codeine Other (See Comments)    "makes me crazy"  . Sulfamethoxazole-Trimethoprim Itching and Other (See Comments)    "makes me crazy"  . Sulfonamide Derivatives Itching and Other (See Comments)    "makes me crazy"    Health Maintenance  Topic Date Due  . FOOT EXAM  Never done  . OPHTHALMOLOGY EXAM  Never done  . COVID-19 Vaccine (1) Never done  . COLONOSCOPY (Pts 45-75yr Insurance coverage will need to be confirmed)  Never done  . INFLUENZA VACCINE  06/21/2020  . HEMOGLOBIN A1C  05/19/2021  . TETANUS/TDAP  08/21/2029  . Hepatitis C Screening  Completed  . PNA vac Low Risk Adult  Completed  . HPV VACCINES  Aged Out    Objective:  Vitals:   01/21/21 0928  BP: 110/69  Pulse: 83  Temp: 97.9 F (36.6 C)  TempSrc: Oral  Weight: 136 lb (61.7 kg)   Body mass index is 20.38 kg/m.  Physical Exam Constitutional:      Comments: He has gained about 10 pounds since discharge but is still well below his weight 1 year ago.  HENT:     Mouth/Throat:     Comments: All remaining teeth have been removed. Cardiovascular:     Rate and Rhythm: Normal rate.  Pulmonary:     Effort: Pulmonary effort is normal.  Abdominal:     Comments: Feeding tube in place.  Musculoskeletal:     Comments: He has diffuse muscle wasting and cachexia.  Psychiatric:        Mood and Affect: Mood normal.     Lab Results Lab Results  Component Value Date   WBC 4.0 11/19/2020   HGB 11.1 (L) 11/19/2020   HCT 35.2 (L) 11/19/2020    MCV 99.7 11/19/2020   PLT 214 11/19/2020    Lab Results  Component Value Date   CREATININE 1.10 11/27/2020   BUN 21 11/27/2020   NA 136 11/27/2020   K 4.2 11/27/2020   CL 97 (L) 11/27/2020   CO2 30 11/27/2020    Lab Results  Component Value Date   ALT 30 11/22/2020   AST 52 (H) 11/22/2020   ALKPHOS 49 11/22/2020   BILITOT 1.2 11/22/2020    Lab Results  Component Value Date   CHOL 168 04/16/2020   HDL 29 (L) 04/16/2020   LDLCALC 83 04/16/2020   TRIG 278 (H) 04/16/2020   CHOLHDL 5.8 04/16/2020   Lab Results  Component Value Date   LABRPR NON-REACTIVE 09/20/2019   RPRTITER 1:1 02/02/2012   HIV 1 RNA Quant (copies/mL)  Date Value  04/27/2020 <20 NOT DETECTED  09/20/2019 <20 NOT DETECTED  09/04/2018 <20 NOT DETECTED   CD4 T Cell Abs (/uL)  Date Value  04/27/2020 554  09/20/2019 250 (L)  09/04/2018 430     Problem List Items Addressed This Visit      High   Human immunodeficiency virus (HIV) disease ([place]    Expect that his infection remains under very good control.  He will get repeat blood work today.  JClair Gullinghas been having some difficulty cutting and crushing the Triumeq to get it down the feeding tube but doubly does not want to make a change to BBoeing  He will continue Triumeq for  now and follow-up in 3 months.      Relevant Medications   abacavir-dolutegravir-lamiVUDine (TRIUMEQ) 600-50-300 MG tablet   Other Relevant Orders   HIV-1 RNA quant-no reflex-bld   T-helper cell (CD4)- ([facility] clinic only)   RPR   Malnutrition of moderate degree    He has worsening malnutrition.  I will make a referral for nutrition counseling since Christopher [name] is uncertain what is best to use for hydration and feeding.      Relevant Orders   Ambulatory referral to Nutrition and Diabetic Education    Other Visit Diagnoses    HIV disease ([place])       Relevant Medications   abacavir-dolutegravir-lamiVUDine (TRIUMEQ) 890-22-840 MG tablet        Michel Bickers, MD [facility]  for Infectious [place] [phone] pager   [phone] cell 01/21/2021, 10:04 AM

## 2021-01-22 LAB — T-HELPER CELL (CD4) - (RCID CLINIC ONLY)
CD4 % Helper T Cell: 35 % (ref 33–65)
CD4 T Cell Abs: 203 /uL — ABNORMAL LOW (ref 400–1790)

## 2021-01-23 LAB — HIV-1 RNA QUANT-NO REFLEX-BLD
HIV 1 RNA Quant: NOT DETECTED Copies/mL
HIV-1 RNA Quant, Log: NOT DETECTED Log cps/mL

## 2021-01-23 LAB — RPR: RPR Ser Ql: NONREACTIVE

## 2021-02-11 ENCOUNTER — Telehealth: Payer: Self-pay | Admitting: *Deleted

## 2021-02-11 NOTE — Telephone Encounter (Signed)
Received call from Retta Diones notifying Dr Megan Salon that Gully passed away this week at home. He was in hospice care. Upcoming appointments cancelled. Landis Gandy, RN

## 2021-02-15 ENCOUNTER — Ambulatory Visit: Payer: PRIVATE HEALTH INSURANCE | Admitting: Skilled Nursing Facility1

## 2021-02-19 DEATH — deceased

## 2021-02-22 ENCOUNTER — Ambulatory Visit: Payer: PRIVATE HEALTH INSURANCE | Admitting: Dietician

## 2021-04-27 ENCOUNTER — Ambulatory Visit: Payer: PRIVATE HEALTH INSURANCE | Admitting: Internal Medicine

## 2021-05-05 ENCOUNTER — Ambulatory Visit: Payer: PRIVATE HEALTH INSURANCE | Admitting: Internal Medicine

## 2021-10-02 IMAGING — CT CT ANGIO HEAD
1 of 11 series · 5 of 33 positions shown · IV contrast (APPLIED)
Comparison: CT head without contrast 04/13/2020

CLINICAL DATA: Carotid artery stenosis. Recent head trauma.

EXAM:
CT ANGIOGRAPHY HEAD AND NECK
TECHNIQUE: Multidetector CT imaging of the head and neck was performed using
the standard protocol during bolus administration of intravenous
contrast. Multiplanar CT image reconstructions and MIPs were
obtained to evaluate the vascular anatomy. Carotid stenosis
measurements (when applicable) are obtained utilizing NASCET
criteria, using the distal internal carotid diameter as the
denominator.
CONTRAST:  100mL OMNIPAQUE IOHEXOL 350 MG/ML SOLN

[Series 13: ax thins · axial · 0.39mm/px · z∈[-399,-151]mm · 5 of 374 slices shown]
[im 63/374  soft-tissue]
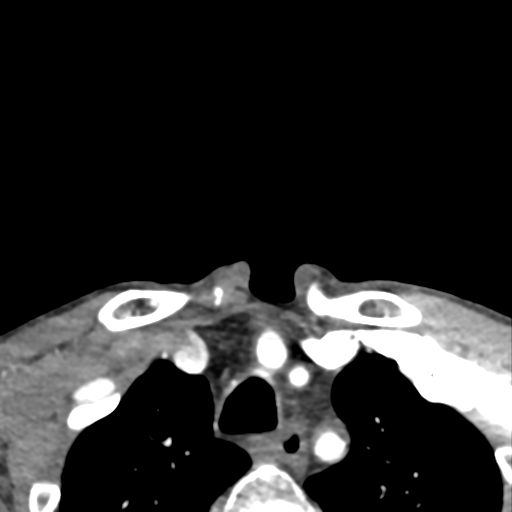
[im 125/374  bone]
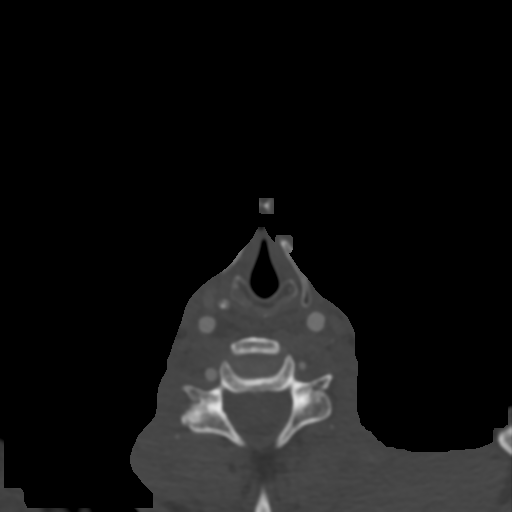
[im 187/374  soft-tissue]
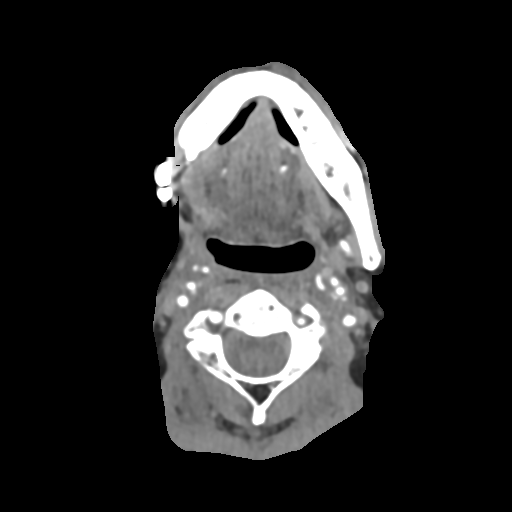
[im 249/374  bone]
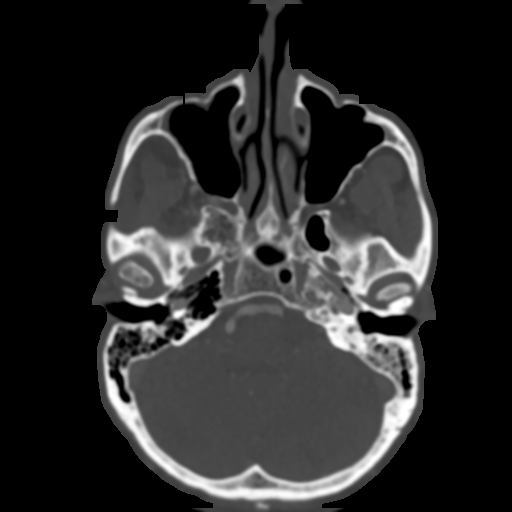
[im 311/374  soft-tissue]
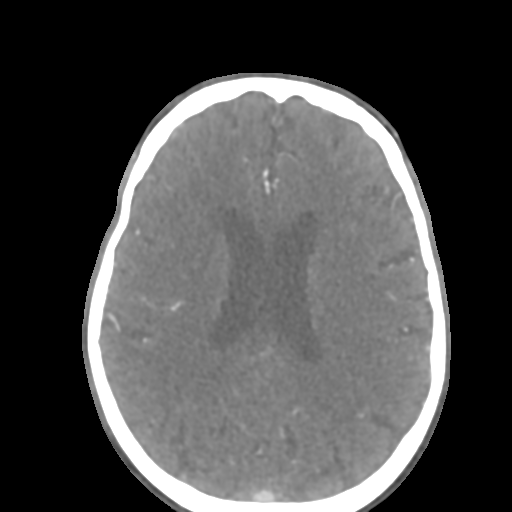

[5 of 33 positions shown; findings below may reference images not displayed]

FINDINGS: CT HEAD FINDINGS

Brain: Mild generalized atrophy and white matter hypoattenuation is
again noted bilaterally. Slight thickening of the posterior falx is
stable, likely ossification. A 4 mm meningioma is present
posteriorly. No extra-axial hemorrhage or fluid is present. The
brainstem and cerebellum are within normal limits.

No acute infarct, hemorrhage, or mass lesion is present.

Vascular: Atherosclerotic calcifications are again seen within the
cavernous internal carotid arteries bilaterally. No hyperdense
vessel is present.

Skull: Right temporoparietal scalp soft tissue swelling is present.
Skin staples are in place. No underlying fracture is present.
Calvarium is intact.

Sinuses: Mild circumferential mucosal thickening is present right
maxillary sinus. No fluid level or fracture is present. The
paranasal sinuses and mastoid air cells are otherwise clear.

Orbits: Bilateral lens replacements are noted. Globes and orbits are
otherwise unremarkable.

Review of the MIP images confirms the above findings

CTA NECK FINDINGS

Aortic arch: Atherosclerotic calcifications are present in the
distal arch. No significant aneurysm is present. No significant
stenosis is present at the great vessel origins.

Right carotid system: Atherosclerotic irregularity is noted along
the walls of proximal right common carotid artery without a
significant stenosis. A high-grade, near occlusive stenosis is
present in the proximal left ICA. A tandem high-grade, near
occlusive stenosis in the left ICA is noted 1.5 cm more distal. This
results in asymmetric attenuation of the distal right ICA lumen.

Left carotid system: The left common carotid artery demonstrates
proximal tortuosity. Anterior atherosclerotic changes are present in
the mid common carotid artery without a significant stenosis. Dense
calcifications present about the left carotid bifurcation. The
minimal luminal diameter of the proximal left ICA is 2.8 mm
proximally. More distally scratched at 3 cm from the bifurcation is
a high-grade, near occlusive stenosis. Non calcified plaque can be
seen. More distal left ICA lumen is normal size.

Vertebral arteries: Right vertebral artery is the dominant vessel.
Atherosclerotic calcifications are present at the origins of both
vessels. There is no significant stenosis on the right. Moderate
proximal left vertebral artery stenosis is present. There is some
irregularity in the left vertebral artery in the neck without a
significant stenosis in the 3 year V4 segment. There is a moderate
left vertebral artery stenosis at the dural margin just after the
left PICA originates. No significant stenosis is present on the
right.

Skeleton: Posterior elements are fused at C2-3. Asymmetric
degenerative facet changes are present on the right at C3-4 and
C4-5. Vertebral body heights are maintained. No focal lytic or
blastic lesions are present.

Other neck: Soft tissues of the neck are otherwise within normal
limits.

Upper chest: Patchy airspace opacity is present lung apices, right
greater than left. Thoracic inlet is normal.

Review of the MIP images confirms the above findings

CTA HEAD FINDINGS

Anterior circulation: Dense atherosclerotic changes are present
within the cavernous internal carotid arteries. A high-grade
stenosis is present at the paraophthalmic segment of the right ICA.
No significant stenosis is present the left. A high-grade stenosis
is present in the proximal right A1 segment. Left A1 segment is
normal. The anterior communicating artery is patent. M1 segments are
normal. MCA bifurcations are intact. ACA and MCA branch vessels are
unremarkable.

Posterior circulation: Right vertebral artery is the dominant
vessel. The left vertebral artery is hypoplastic above the dura.
Basilar artery is narrowed to 50%. Both posterior cerebral arteries
originate from basilar tip. The PCA branch vessels are within normal
limits. Moderate narrowing is present in the distal right P2
segment.

Venous sinuses: Dural sinuses are patent. The straight sinus and
deep cerebral veins are intact. Cortical veins are unremarkable. No
vascular malformations are present.

Anatomic variants: None

Review of the MIP images confirms the above findings
IMPRESSION: 1. Tandem high-grade, near occlusive, stenoses of the right internal
carotid artery at the bifurcation and 1.5 cm from the bifurcation.
2. High-grade, near occlusive, stenosis of the left internal carotid
artery 3 cm from the bifurcation. The distal left ICA is the more
normal vessel.
3. Moderate proximal left vertebral artery stenosis.
4. Moderate left vertebral artery stenosis at the dural margin just
after the left PICA originates.
5. 50% stenosis of the basilar artery.
6. Moderate narrowing of the distal right P2 segment.
7. Mild generalized atrophy and white matter disease is stable.
8. 4 mm meningioma posteriorly.
9. No extra-axial hemorrhage.
10. Right temporoparietal scalp soft tissue swelling is improving.
11. Aortic Atherosclerosis ([id number]).
